# Patient Record
Sex: Female | Born: 1960
Health system: Southern US, Community
[De-identification: ages and names within clinical notes are randomized; demographics above are authoritative.]

## PROBLEM LIST (undated history)

## (undated) DIAGNOSIS — R112 Nausea with vomiting, unspecified: Secondary | ICD-10-CM

## (undated) DIAGNOSIS — Z8619 Personal history of other infectious and parasitic diseases: Secondary | ICD-10-CM

## (undated) DIAGNOSIS — E785 Hyperlipidemia, unspecified: Secondary | ICD-10-CM

## (undated) DIAGNOSIS — I10 Essential (primary) hypertension: Secondary | ICD-10-CM

## (undated) DIAGNOSIS — Z8489 Family history of other specified conditions: Secondary | ICD-10-CM

## (undated) DIAGNOSIS — K219 Gastro-esophageal reflux disease without esophagitis: Secondary | ICD-10-CM

## (undated) DIAGNOSIS — Z87442 Personal history of urinary calculi: Secondary | ICD-10-CM

## (undated) DIAGNOSIS — Z9889 Other specified postprocedural states: Secondary | ICD-10-CM

## (undated) DIAGNOSIS — M5430 Sciatica, unspecified side: Secondary | ICD-10-CM

## (undated) DIAGNOSIS — F419 Anxiety disorder, unspecified: Secondary | ICD-10-CM

## (undated) HISTORY — DX: Hyperlipidemia, unspecified: E78.5

## (undated) HISTORY — DX: Essential (primary) hypertension: I10

## (undated) HISTORY — PX: ECTOPIC PREGNANCY SURGERY: SHX613

## (undated) HISTORY — DX: Sciatica, unspecified side: M54.30

## (undated) HISTORY — DX: Personal history of other infectious and parasitic diseases: Z86.19

---

## 2002-10-25 HISTORY — PX: OTHER SURGICAL HISTORY: SHX169

## 2004-07-24 ENCOUNTER — Emergency Department: Payer: Self-pay | Admitting: Emergency Medicine

## 2004-10-25 HISTORY — PX: OTHER SURGICAL HISTORY: SHX169

## 2005-03-29 ENCOUNTER — Emergency Department: Payer: Self-pay | Admitting: Emergency Medicine

## 2005-04-25 ENCOUNTER — Encounter: Admission: RE | Admit: 2005-04-25 | Discharge: 2005-04-25 | Payer: Self-pay | Admitting: Orthopedic Surgery

## 2005-04-30 ENCOUNTER — Emergency Department: Payer: Self-pay | Admitting: Emergency Medicine

## 2005-05-03 ENCOUNTER — Ambulatory Visit (HOSPITAL_COMMUNITY): Admission: RE | Admit: 2005-05-03 | Discharge: 2005-05-03 | Payer: Self-pay | Admitting: Orthopedic Surgery

## 2005-07-09 ENCOUNTER — Inpatient Hospital Stay (HOSPITAL_COMMUNITY): Admission: RE | Admit: 2005-07-09 | Discharge: 2005-07-11 | Payer: Self-pay | Admitting: Neurosurgery

## 2005-07-15 ENCOUNTER — Emergency Department (HOSPITAL_COMMUNITY): Admission: EM | Admit: 2005-07-15 | Discharge: 2005-07-15 | Payer: Self-pay | Admitting: Emergency Medicine

## 2005-11-24 ENCOUNTER — Emergency Department: Payer: Self-pay | Admitting: Emergency Medicine

## 2006-03-10 ENCOUNTER — Encounter: Admission: RE | Admit: 2006-03-10 | Discharge: 2006-03-10 | Payer: Self-pay | Admitting: Orthopedic Surgery

## 2006-06-04 ENCOUNTER — Inpatient Hospital Stay: Payer: Self-pay | Admitting: Internal Medicine

## 2008-01-25 ENCOUNTER — Emergency Department: Payer: Self-pay | Admitting: Emergency Medicine

## 2008-03-26 ENCOUNTER — Emergency Department: Payer: Self-pay | Admitting: Emergency Medicine

## 2009-01-26 ENCOUNTER — Emergency Department: Payer: Self-pay | Admitting: Emergency Medicine

## 2009-06-20 ENCOUNTER — Emergency Department: Payer: Self-pay | Admitting: Emergency Medicine

## 2009-09-16 DIAGNOSIS — I10 Essential (primary) hypertension: Secondary | ICD-10-CM | POA: Insufficient documentation

## 2009-09-16 DIAGNOSIS — G5620 Lesion of ulnar nerve, unspecified upper limb: Secondary | ICD-10-CM | POA: Insufficient documentation

## 2009-09-16 DIAGNOSIS — G4700 Insomnia, unspecified: Secondary | ICD-10-CM | POA: Insufficient documentation

## 2009-10-25 HISTORY — PX: ABDOMINAL HYSTERECTOMY: SHX81

## 2009-10-25 HISTORY — PX: SUPRACERVICAL ABDOMINAL HYSTERECTOMY: SHX5393

## 2010-01-28 DIAGNOSIS — Z720 Tobacco use: Secondary | ICD-10-CM | POA: Insufficient documentation

## 2010-01-29 DIAGNOSIS — E7800 Pure hypercholesterolemia, unspecified: Secondary | ICD-10-CM | POA: Insufficient documentation

## 2010-02-27 ENCOUNTER — Ambulatory Visit: Payer: Self-pay | Admitting: Family Medicine

## 2010-03-03 ENCOUNTER — Ambulatory Visit: Payer: Self-pay | Admitting: Obstetrics and Gynecology

## 2010-03-10 ENCOUNTER — Ambulatory Visit: Payer: Self-pay | Admitting: Obstetrics and Gynecology

## 2010-03-30 ENCOUNTER — Ambulatory Visit: Payer: Self-pay | Admitting: Family Medicine

## 2010-07-23 ENCOUNTER — Emergency Department: Payer: Self-pay | Admitting: Emergency Medicine

## 2011-02-24 DIAGNOSIS — J449 Chronic obstructive pulmonary disease, unspecified: Secondary | ICD-10-CM | POA: Insufficient documentation

## 2011-04-01 ENCOUNTER — Ambulatory Visit: Payer: Self-pay | Admitting: Family Medicine

## 2011-04-06 ENCOUNTER — Ambulatory Visit: Payer: Self-pay | Admitting: Family Medicine

## 2011-08-05 ENCOUNTER — Ambulatory Visit: Payer: Self-pay | Admitting: Family Medicine

## 2011-10-07 ENCOUNTER — Ambulatory Visit: Payer: Self-pay | Admitting: Family Medicine

## 2012-04-10 ENCOUNTER — Ambulatory Visit: Payer: Self-pay | Admitting: Family Medicine

## 2012-04-10 DIAGNOSIS — R928 Other abnormal and inconclusive findings on diagnostic imaging of breast: Secondary | ICD-10-CM | POA: Diagnosis not present

## 2012-05-04 ENCOUNTER — Ambulatory Visit: Payer: Self-pay | Admitting: Orthopedic Surgery

## 2012-05-04 DIAGNOSIS — M79609 Pain in unspecified limb: Secondary | ICD-10-CM | POA: Diagnosis not present

## 2012-06-19 ENCOUNTER — Ambulatory Visit: Payer: Self-pay | Admitting: Orthopedic Surgery

## 2012-06-19 DIAGNOSIS — M542 Cervicalgia: Secondary | ICD-10-CM | POA: Diagnosis not present

## 2012-06-20 DIAGNOSIS — R7309 Other abnormal glucose: Secondary | ICD-10-CM | POA: Diagnosis not present

## 2012-06-20 DIAGNOSIS — Z0000 Encounter for general adult medical examination without abnormal findings: Secondary | ICD-10-CM | POA: Diagnosis not present

## 2012-06-20 DIAGNOSIS — R110 Nausea: Secondary | ICD-10-CM | POA: Diagnosis not present

## 2012-06-20 DIAGNOSIS — M25529 Pain in unspecified elbow: Secondary | ICD-10-CM | POA: Diagnosis not present

## 2012-06-20 DIAGNOSIS — E559 Vitamin D deficiency, unspecified: Secondary | ICD-10-CM | POA: Diagnosis not present

## 2012-06-20 DIAGNOSIS — E785 Hyperlipidemia, unspecified: Secondary | ICD-10-CM | POA: Diagnosis not present

## 2012-06-20 DIAGNOSIS — E7800 Pure hypercholesterolemia, unspecified: Secondary | ICD-10-CM | POA: Diagnosis not present

## 2012-07-05 DIAGNOSIS — M545 Low back pain, unspecified: Secondary | ICD-10-CM | POA: Diagnosis not present

## 2012-07-11 ENCOUNTER — Ambulatory Visit: Payer: Self-pay | Admitting: Family Medicine

## 2012-07-11 DIAGNOSIS — R110 Nausea: Secondary | ICD-10-CM | POA: Diagnosis not present

## 2012-07-28 DIAGNOSIS — J019 Acute sinusitis, unspecified: Secondary | ICD-10-CM | POA: Diagnosis not present

## 2012-07-28 DIAGNOSIS — F17200 Nicotine dependence, unspecified, uncomplicated: Secondary | ICD-10-CM | POA: Diagnosis not present

## 2012-07-28 DIAGNOSIS — J209 Acute bronchitis, unspecified: Secondary | ICD-10-CM | POA: Diagnosis not present

## 2012-10-05 ENCOUNTER — Other Ambulatory Visit: Payer: Self-pay | Admitting: Family Medicine

## 2012-10-06 NOTE — Telephone Encounter (Signed)
Please pull paper chart.  

## 2012-10-06 NOTE — Telephone Encounter (Signed)
Pt not found in medman by name/dob. Pt not seen in epic. No chart to pull.'  bf

## 2012-10-27 DIAGNOSIS — E559 Vitamin D deficiency, unspecified: Secondary | ICD-10-CM | POA: Diagnosis not present

## 2012-10-27 DIAGNOSIS — Z23 Encounter for immunization: Secondary | ICD-10-CM | POA: Diagnosis not present

## 2012-10-27 DIAGNOSIS — E7800 Pure hypercholesterolemia, unspecified: Secondary | ICD-10-CM | POA: Diagnosis not present

## 2012-10-27 DIAGNOSIS — I10 Essential (primary) hypertension: Secondary | ICD-10-CM | POA: Diagnosis not present

## 2012-11-24 ENCOUNTER — Ambulatory Visit: Payer: Self-pay | Admitting: Family Medicine

## 2012-11-24 DIAGNOSIS — M25569 Pain in unspecified knee: Secondary | ICD-10-CM | POA: Diagnosis not present

## 2012-11-24 DIAGNOSIS — M25469 Effusion, unspecified knee: Secondary | ICD-10-CM | POA: Diagnosis not present

## 2012-11-24 DIAGNOSIS — E7800 Pure hypercholesterolemia, unspecified: Secondary | ICD-10-CM | POA: Diagnosis not present

## 2012-11-24 DIAGNOSIS — Z23 Encounter for immunization: Secondary | ICD-10-CM | POA: Diagnosis not present

## 2012-11-24 DIAGNOSIS — E559 Vitamin D deficiency, unspecified: Secondary | ICD-10-CM | POA: Diagnosis not present

## 2012-11-30 ENCOUNTER — Other Ambulatory Visit: Payer: Self-pay | Admitting: Family Medicine

## 2012-12-04 ENCOUNTER — Other Ambulatory Visit: Payer: Self-pay | Admitting: Family Medicine

## 2012-12-15 DIAGNOSIS — M25569 Pain in unspecified knee: Secondary | ICD-10-CM | POA: Diagnosis not present

## 2012-12-15 DIAGNOSIS — E7800 Pure hypercholesterolemia, unspecified: Secondary | ICD-10-CM | POA: Diagnosis not present

## 2012-12-15 DIAGNOSIS — N390 Urinary tract infection, site not specified: Secondary | ICD-10-CM | POA: Diagnosis not present

## 2012-12-15 DIAGNOSIS — Z23 Encounter for immunization: Secondary | ICD-10-CM | POA: Diagnosis not present

## 2013-01-11 ENCOUNTER — Other Ambulatory Visit: Payer: Self-pay | Admitting: Family Medicine

## 2013-06-09 ENCOUNTER — Emergency Department: Payer: Self-pay | Admitting: Emergency Medicine

## 2013-06-09 DIAGNOSIS — I10 Essential (primary) hypertension: Secondary | ICD-10-CM | POA: Diagnosis not present

## 2013-06-09 DIAGNOSIS — M25569 Pain in unspecified knee: Secondary | ICD-10-CM | POA: Diagnosis not present

## 2013-06-09 DIAGNOSIS — Z79899 Other long term (current) drug therapy: Secondary | ICD-10-CM | POA: Diagnosis not present

## 2013-06-09 DIAGNOSIS — R609 Edema, unspecified: Secondary | ICD-10-CM | POA: Diagnosis not present

## 2013-07-12 DIAGNOSIS — Z0000 Encounter for general adult medical examination without abnormal findings: Secondary | ICD-10-CM | POA: Diagnosis not present

## 2013-07-12 DIAGNOSIS — Z23 Encounter for immunization: Secondary | ICD-10-CM | POA: Diagnosis not present

## 2013-07-12 DIAGNOSIS — E7800 Pure hypercholesterolemia, unspecified: Secondary | ICD-10-CM | POA: Diagnosis not present

## 2013-07-12 DIAGNOSIS — F329 Major depressive disorder, single episode, unspecified: Secondary | ICD-10-CM | POA: Diagnosis not present

## 2013-07-12 DIAGNOSIS — F17200 Nicotine dependence, unspecified, uncomplicated: Secondary | ICD-10-CM | POA: Diagnosis not present

## 2013-07-12 DIAGNOSIS — I10 Essential (primary) hypertension: Secondary | ICD-10-CM | POA: Diagnosis not present

## 2013-07-12 DIAGNOSIS — J309 Allergic rhinitis, unspecified: Secondary | ICD-10-CM | POA: Diagnosis not present

## 2013-07-12 DIAGNOSIS — E785 Hyperlipidemia, unspecified: Secondary | ICD-10-CM | POA: Diagnosis not present

## 2013-07-12 LAB — HM PAP SMEAR: HM Pap smear: NEGATIVE

## 2013-08-02 DIAGNOSIS — I10 Essential (primary) hypertension: Secondary | ICD-10-CM | POA: Diagnosis not present

## 2013-08-02 DIAGNOSIS — E785 Hyperlipidemia, unspecified: Secondary | ICD-10-CM | POA: Diagnosis not present

## 2013-08-02 DIAGNOSIS — N390 Urinary tract infection, site not specified: Secondary | ICD-10-CM | POA: Diagnosis not present

## 2013-08-02 DIAGNOSIS — F329 Major depressive disorder, single episode, unspecified: Secondary | ICD-10-CM | POA: Diagnosis not present

## 2013-08-02 DIAGNOSIS — Z23 Encounter for immunization: Secondary | ICD-10-CM | POA: Diagnosis not present

## 2013-08-02 DIAGNOSIS — J309 Allergic rhinitis, unspecified: Secondary | ICD-10-CM | POA: Diagnosis not present

## 2013-08-02 DIAGNOSIS — F17200 Nicotine dependence, unspecified, uncomplicated: Secondary | ICD-10-CM | POA: Diagnosis not present

## 2013-08-02 DIAGNOSIS — E7800 Pure hypercholesterolemia, unspecified: Secondary | ICD-10-CM | POA: Diagnosis not present

## 2013-09-25 DIAGNOSIS — F17200 Nicotine dependence, unspecified, uncomplicated: Secondary | ICD-10-CM | POA: Diagnosis not present

## 2013-09-25 DIAGNOSIS — E7800 Pure hypercholesterolemia, unspecified: Secondary | ICD-10-CM | POA: Diagnosis not present

## 2013-09-25 DIAGNOSIS — Z23 Encounter for immunization: Secondary | ICD-10-CM | POA: Diagnosis not present

## 2013-09-25 DIAGNOSIS — I10 Essential (primary) hypertension: Secondary | ICD-10-CM | POA: Diagnosis not present

## 2013-09-25 DIAGNOSIS — F329 Major depressive disorder, single episode, unspecified: Secondary | ICD-10-CM | POA: Diagnosis not present

## 2013-09-25 DIAGNOSIS — M549 Dorsalgia, unspecified: Secondary | ICD-10-CM | POA: Diagnosis not present

## 2013-09-25 DIAGNOSIS — R319 Hematuria, unspecified: Secondary | ICD-10-CM | POA: Diagnosis not present

## 2013-09-25 DIAGNOSIS — E785 Hyperlipidemia, unspecified: Secondary | ICD-10-CM | POA: Diagnosis not present

## 2013-11-08 ENCOUNTER — Ambulatory Visit: Payer: Self-pay | Admitting: Family Medicine

## 2013-11-08 DIAGNOSIS — F3289 Other specified depressive episodes: Secondary | ICD-10-CM | POA: Diagnosis not present

## 2013-11-08 DIAGNOSIS — Z23 Encounter for immunization: Secondary | ICD-10-CM | POA: Diagnosis not present

## 2013-11-08 DIAGNOSIS — R110 Nausea: Secondary | ICD-10-CM | POA: Diagnosis not present

## 2013-11-08 DIAGNOSIS — F329 Major depressive disorder, single episode, unspecified: Secondary | ICD-10-CM | POA: Diagnosis not present

## 2013-11-08 DIAGNOSIS — R319 Hematuria, unspecified: Secondary | ICD-10-CM | POA: Diagnosis not present

## 2013-11-08 DIAGNOSIS — E7800 Pure hypercholesterolemia, unspecified: Secondary | ICD-10-CM | POA: Diagnosis not present

## 2013-11-08 DIAGNOSIS — E785 Hyperlipidemia, unspecified: Secondary | ICD-10-CM | POA: Diagnosis not present

## 2013-11-08 DIAGNOSIS — I10 Essential (primary) hypertension: Secondary | ICD-10-CM | POA: Diagnosis not present

## 2013-11-08 DIAGNOSIS — F17200 Nicotine dependence, unspecified, uncomplicated: Secondary | ICD-10-CM | POA: Diagnosis not present

## 2013-11-08 DIAGNOSIS — Z9889 Other specified postprocedural states: Secondary | ICD-10-CM | POA: Diagnosis not present

## 2013-11-08 DIAGNOSIS — R51 Headache: Secondary | ICD-10-CM | POA: Diagnosis not present

## 2013-11-16 ENCOUNTER — Ambulatory Visit: Payer: Self-pay | Admitting: Family Medicine

## 2013-11-16 DIAGNOSIS — Z9889 Other specified postprocedural states: Secondary | ICD-10-CM | POA: Diagnosis not present

## 2013-11-16 DIAGNOSIS — R51 Headache: Secondary | ICD-10-CM | POA: Diagnosis not present

## 2013-12-26 ENCOUNTER — Ambulatory Visit: Payer: Self-pay | Admitting: Family Medicine

## 2013-12-26 DIAGNOSIS — F329 Major depressive disorder, single episode, unspecified: Secondary | ICD-10-CM | POA: Diagnosis not present

## 2013-12-26 DIAGNOSIS — I10 Essential (primary) hypertension: Secondary | ICD-10-CM | POA: Diagnosis not present

## 2013-12-26 DIAGNOSIS — E785 Hyperlipidemia, unspecified: Secondary | ICD-10-CM | POA: Diagnosis not present

## 2013-12-26 DIAGNOSIS — F17200 Nicotine dependence, unspecified, uncomplicated: Secondary | ICD-10-CM | POA: Diagnosis not present

## 2013-12-26 DIAGNOSIS — M5030 Other cervical disc degeneration, unspecified cervical region: Secondary | ICD-10-CM | POA: Diagnosis not present

## 2013-12-26 DIAGNOSIS — Z23 Encounter for immunization: Secondary | ICD-10-CM | POA: Diagnosis not present

## 2013-12-26 DIAGNOSIS — R319 Hematuria, unspecified: Secondary | ICD-10-CM | POA: Diagnosis not present

## 2013-12-26 DIAGNOSIS — M47812 Spondylosis without myelopathy or radiculopathy, cervical region: Secondary | ICD-10-CM | POA: Diagnosis not present

## 2013-12-26 DIAGNOSIS — M542 Cervicalgia: Secondary | ICD-10-CM | POA: Diagnosis not present

## 2013-12-26 DIAGNOSIS — F3289 Other specified depressive episodes: Secondary | ICD-10-CM | POA: Diagnosis not present

## 2013-12-26 DIAGNOSIS — E7800 Pure hypercholesterolemia, unspecified: Secondary | ICD-10-CM | POA: Diagnosis not present

## 2014-01-22 DIAGNOSIS — E785 Hyperlipidemia, unspecified: Secondary | ICD-10-CM | POA: Diagnosis not present

## 2014-01-22 DIAGNOSIS — J309 Allergic rhinitis, unspecified: Secondary | ICD-10-CM | POA: Diagnosis not present

## 2014-01-22 DIAGNOSIS — F329 Major depressive disorder, single episode, unspecified: Secondary | ICD-10-CM | POA: Diagnosis not present

## 2014-01-22 DIAGNOSIS — R51 Headache: Secondary | ICD-10-CM | POA: Diagnosis not present

## 2014-01-22 DIAGNOSIS — F3289 Other specified depressive episodes: Secondary | ICD-10-CM | POA: Diagnosis not present

## 2014-01-22 DIAGNOSIS — F17200 Nicotine dependence, unspecified, uncomplicated: Secondary | ICD-10-CM | POA: Diagnosis not present

## 2014-01-22 DIAGNOSIS — J019 Acute sinusitis, unspecified: Secondary | ICD-10-CM | POA: Diagnosis not present

## 2014-01-22 DIAGNOSIS — N390 Urinary tract infection, site not specified: Secondary | ICD-10-CM | POA: Diagnosis not present

## 2014-01-22 DIAGNOSIS — I10 Essential (primary) hypertension: Secondary | ICD-10-CM | POA: Diagnosis not present

## 2014-05-02 ENCOUNTER — Other Ambulatory Visit: Payer: Self-pay | Admitting: Family Medicine

## 2014-05-18 DIAGNOSIS — M5380 Other specified dorsopathies, site unspecified: Secondary | ICD-10-CM | POA: Diagnosis not present

## 2014-05-18 DIAGNOSIS — R300 Dysuria: Secondary | ICD-10-CM | POA: Diagnosis not present

## 2014-06-24 DIAGNOSIS — I10 Essential (primary) hypertension: Secondary | ICD-10-CM | POA: Diagnosis not present

## 2014-06-24 DIAGNOSIS — F17200 Nicotine dependence, unspecified, uncomplicated: Secondary | ICD-10-CM | POA: Diagnosis not present

## 2014-06-24 DIAGNOSIS — F4320 Adjustment disorder, unspecified: Secondary | ICD-10-CM | POA: Diagnosis not present

## 2014-06-24 DIAGNOSIS — M545 Low back pain, unspecified: Secondary | ICD-10-CM | POA: Diagnosis not present

## 2014-06-24 DIAGNOSIS — R51 Headache: Secondary | ICD-10-CM | POA: Diagnosis not present

## 2014-06-24 DIAGNOSIS — F329 Major depressive disorder, single episode, unspecified: Secondary | ICD-10-CM | POA: Diagnosis not present

## 2014-06-24 DIAGNOSIS — R209 Unspecified disturbances of skin sensation: Secondary | ICD-10-CM | POA: Diagnosis not present

## 2014-06-24 DIAGNOSIS — E785 Hyperlipidemia, unspecified: Secondary | ICD-10-CM | POA: Diagnosis not present

## 2014-06-24 DIAGNOSIS — F3289 Other specified depressive episodes: Secondary | ICD-10-CM | POA: Diagnosis not present

## 2014-06-24 LAB — BASIC METABOLIC PANEL
BUN: 9 mg/dL (ref 4–21)
Creatinine: 0.8 mg/dL (ref 0.5–1.1)
Glucose: 91 mg/dL
Potassium: 4.2 mmol/L (ref 3.4–5.3)
SODIUM: 139 mmol/L (ref 137–147)

## 2014-06-24 LAB — TSH: TSH: 1.41 u[IU]/mL (ref 0.41–5.90)

## 2014-07-11 DIAGNOSIS — F3289 Other specified depressive episodes: Secondary | ICD-10-CM | POA: Diagnosis not present

## 2014-07-11 DIAGNOSIS — J209 Acute bronchitis, unspecified: Secondary | ICD-10-CM | POA: Diagnosis not present

## 2014-07-11 DIAGNOSIS — R059 Cough, unspecified: Secondary | ICD-10-CM | POA: Diagnosis not present

## 2014-07-11 DIAGNOSIS — R51 Headache: Secondary | ICD-10-CM | POA: Diagnosis not present

## 2014-07-11 DIAGNOSIS — F17200 Nicotine dependence, unspecified, uncomplicated: Secondary | ICD-10-CM | POA: Diagnosis not present

## 2014-07-11 DIAGNOSIS — F329 Major depressive disorder, single episode, unspecified: Secondary | ICD-10-CM | POA: Diagnosis not present

## 2014-07-11 DIAGNOSIS — R209 Unspecified disturbances of skin sensation: Secondary | ICD-10-CM | POA: Diagnosis not present

## 2014-07-11 DIAGNOSIS — I10 Essential (primary) hypertension: Secondary | ICD-10-CM | POA: Diagnosis not present

## 2014-07-11 DIAGNOSIS — E785 Hyperlipidemia, unspecified: Secondary | ICD-10-CM | POA: Diagnosis not present

## 2014-07-11 DIAGNOSIS — R05 Cough: Secondary | ICD-10-CM | POA: Diagnosis not present

## 2014-07-24 ENCOUNTER — Emergency Department: Payer: Self-pay | Admitting: Emergency Medicine

## 2014-07-24 DIAGNOSIS — R112 Nausea with vomiting, unspecified: Secondary | ICD-10-CM | POA: Diagnosis not present

## 2014-07-24 DIAGNOSIS — Z880 Allergy status to penicillin: Secondary | ICD-10-CM | POA: Diagnosis not present

## 2014-07-24 DIAGNOSIS — I10 Essential (primary) hypertension: Secondary | ICD-10-CM | POA: Diagnosis not present

## 2014-07-24 DIAGNOSIS — R109 Unspecified abdominal pain: Secondary | ICD-10-CM | POA: Diagnosis not present

## 2014-07-24 DIAGNOSIS — R1110 Vomiting, unspecified: Secondary | ICD-10-CM | POA: Diagnosis not present

## 2014-07-24 LAB — COMPREHENSIVE METABOLIC PANEL
ALK PHOS: 127 U/L — AB
Albumin: 3.9 g/dL (ref 3.4–5.0)
Anion Gap: 6 — ABNORMAL LOW (ref 7–16)
BUN: 10 mg/dL (ref 7–18)
Bilirubin,Total: 0.6 mg/dL (ref 0.2–1.0)
CALCIUM: 8.9 mg/dL (ref 8.5–10.1)
CHLORIDE: 111 mmol/L — AB (ref 98–107)
CO2: 25 mmol/L (ref 21–32)
CREATININE: 0.93 mg/dL (ref 0.60–1.30)
EGFR (Non-African Amer.): >60
Glucose: 90 mg/dL (ref 65–99)
Osmolality: 282 (ref 275–301)
POTASSIUM: 4.1 mmol/L (ref 3.5–5.1)
SGOT(AST): 22 U/L (ref 15–37)
SGPT (ALT): 21 U/L
Sodium: 142 mmol/L (ref 136–145)
Total Protein: 7.5 g/dL (ref 6.4–8.2)

## 2014-07-24 LAB — CBC WITH DIFFERENTIAL/PLATELET
BASOS PCT: 0.7 %
Basophil #: 0.1 10*3/uL (ref 0.0–0.1)
EOS PCT: 0.9 %
Eosinophil #: 0.1 10*3/uL (ref 0.0–0.7)
HCT: 42.1 % (ref 35.0–47.0)
HGB: 13.9 g/dL (ref 12.0–16.0)
Lymphocyte #: 3.6 10*3/uL (ref 1.0–3.6)
Lymphocyte %: 37.2 %
MCH: 28.9 pg (ref 26.0–34.0)
MCHC: 33.1 g/dL (ref 32.0–36.0)
MCV: 87 fL (ref 80–100)
MONO ABS: 0.7 x10 3/mm (ref 0.2–0.9)
MONOS PCT: 7.7 %
NEUTROS ABS: 5.1 10*3/uL (ref 1.4–6.5)
Neutrophil %: 53.5 %
Platelet: 318 10*3/uL (ref 150–440)
RBC: 4.82 10*6/uL (ref 3.80–5.20)
RDW: 13.6 % (ref 11.5–14.5)
WBC: 9.5 10*3/uL (ref 3.6–11.0)

## 2014-07-24 LAB — URINALYSIS, COMPLETE
BACTERIA: NONE SEEN
BILIRUBIN, UR: NEGATIVE
Blood: NEGATIVE
GLUCOSE, UR: NEGATIVE mg/dL (ref 0–75)
KETONE: NEGATIVE
Leukocyte Esterase: NEGATIVE
Nitrite: NEGATIVE
Ph: 6 (ref 4.5–8.0)
Protein: NEGATIVE
RBC,UR: 2 /HPF (ref 0–5)
Specific Gravity: 1.012 (ref 1.003–1.030)
WBC UR: 2 /HPF (ref 0–5)

## 2014-07-24 LAB — TROPONIN I: Troponin-I: <0.02 ng/mL

## 2014-07-24 LAB — LIPASE, BLOOD: LIPASE: 95 U/L (ref 73–393)

## 2014-08-06 ENCOUNTER — Ambulatory Visit: Payer: Self-pay | Admitting: Family Medicine

## 2014-11-23 ENCOUNTER — Emergency Department: Payer: Self-pay | Admitting: Student

## 2014-11-23 DIAGNOSIS — G43909 Migraine, unspecified, not intractable, without status migrainosus: Secondary | ICD-10-CM | POA: Diagnosis not present

## 2014-11-23 DIAGNOSIS — R51 Headache: Secondary | ICD-10-CM | POA: Diagnosis not present

## 2014-11-23 DIAGNOSIS — Z720 Tobacco use: Secondary | ICD-10-CM | POA: Diagnosis not present

## 2014-11-23 DIAGNOSIS — I10 Essential (primary) hypertension: Secondary | ICD-10-CM | POA: Diagnosis not present

## 2014-11-23 DIAGNOSIS — R110 Nausea: Secondary | ICD-10-CM | POA: Diagnosis not present

## 2014-11-23 DIAGNOSIS — Z880 Allergy status to penicillin: Secondary | ICD-10-CM | POA: Diagnosis not present

## 2014-11-23 LAB — URINALYSIS, COMPLETE
Bilirubin,UR: NEGATIVE
Blood: NEGATIVE
Glucose,UR: NEGATIVE mg/dL (ref 0–75)
Leukocyte Esterase: NEGATIVE
NITRITE: NEGATIVE
PROTEIN: NEGATIVE
Ph: 6 (ref 4.5–8.0)
RBC,UR: 1 /HPF (ref 0–5)
SPECIFIC GRAVITY: 1.016 (ref 1.003–1.030)
Squamous Epithelial: 1

## 2014-11-23 LAB — COMPREHENSIVE METABOLIC PANEL
ALK PHOS: 128 U/L — AB (ref 46–116)
ALT: 27 U/L (ref 14–63)
AST: 24 U/L (ref 15–37)
Albumin: 3.9 g/dL (ref 3.4–5.0)
Anion Gap: 7 (ref 7–16)
BILIRUBIN TOTAL: 0.4 mg/dL (ref 0.2–1.0)
BUN: 10 mg/dL (ref 7–18)
CREATININE: 0.93 mg/dL (ref 0.60–1.30)
Calcium, Total: 9.2 mg/dL (ref 8.5–10.1)
Chloride: 109 mmol/L — ABNORMAL HIGH (ref 98–107)
Co2: 23 mmol/L (ref 21–32)
GLUCOSE: 108 mg/dL — AB (ref 65–99)
OSMOLALITY: 277 (ref 275–301)
Potassium: 3.6 mmol/L (ref 3.5–5.1)
Sodium: 139 mmol/L (ref 136–145)
TOTAL PROTEIN: 7.8 g/dL (ref 6.4–8.2)

## 2014-11-23 LAB — APTT: Activated PTT: 32.4 secs (ref 23.6–35.9)

## 2014-11-23 LAB — PROTIME-INR
INR: 0.9
Prothrombin Time: 12.6 secs

## 2014-11-23 LAB — CBC WITH DIFFERENTIAL/PLATELET
BASOS PCT: 0.7 %
Basophil #: 0.1 10*3/uL (ref 0.0–0.1)
Eosinophil #: 0.1 10*3/uL (ref 0.0–0.7)
Eosinophil %: 1 %
HCT: 44 % (ref 35.0–47.0)
HGB: 14.8 g/dL (ref 12.0–16.0)
LYMPHS PCT: 33.5 %
Lymphocyte #: 3.4 10*3/uL (ref 1.0–3.6)
MCH: 29.1 pg (ref 26.0–34.0)
MCHC: 33.5 g/dL (ref 32.0–36.0)
MCV: 87 fL (ref 80–100)
Monocyte #: 0.9 x10 3/mm (ref 0.2–0.9)
Monocyte %: 8.8 %
NEUTROS PCT: 56 %
Neutrophil #: 5.8 10*3/uL (ref 1.4–6.5)
PLATELETS: 307 10*3/uL (ref 150–440)
RBC: 5.07 10*6/uL (ref 3.80–5.20)
RDW: 14 % (ref 11.5–14.5)
WBC: 10.3 10*3/uL (ref 3.6–11.0)

## 2014-11-23 LAB — TROPONIN I: Troponin-I: <0.02 ng/mL

## 2015-01-08 DIAGNOSIS — E780 Pure hypercholesterolemia: Secondary | ICD-10-CM | POA: Diagnosis not present

## 2015-01-08 DIAGNOSIS — R7309 Other abnormal glucose: Secondary | ICD-10-CM | POA: Diagnosis not present

## 2015-01-08 DIAGNOSIS — E559 Vitamin D deficiency, unspecified: Secondary | ICD-10-CM | POA: Diagnosis not present

## 2015-01-08 LAB — LIPID PANEL
CHOLESTEROL: 201 mg/dL — AB (ref 0–200)
HDL: 48 mg/dL (ref 35–70)
LDL CALC: 131 mg/dL
TRIGLYCERIDES: 110 mg/dL (ref 40–160)

## 2015-01-08 LAB — HEMOGLOBIN A1C: Hgb A1c MFr Bld: 5.8 % (ref 4.0–6.0)

## 2015-04-02 ENCOUNTER — Other Ambulatory Visit: Payer: Self-pay | Admitting: Family Medicine

## 2015-04-02 NOTE — Telephone Encounter (Signed)
Please call in   [id number] [phone]   Surescripts Out Interface  31 minutes ago   (12:39 PM)       Requested Medications     Medication name:  Name from pharmacy:  traMADol (ULTRAM) 50 MG tablet TRAMADOL HCL 50 MG TABLET    Sig: TAKE 1 TABLET BY MOUTH EVERY 8 HOURS AS NEEDED    Dispense: 60 tablet   Start: 04/02/2015   RF x 3 Class: Normal

## 2015-04-03 ENCOUNTER — Other Ambulatory Visit: Payer: Self-pay | Admitting: Family Medicine

## 2015-04-03 NOTE — Telephone Encounter (Signed)
Pt contacted office for refill request on the following medications:HYDROCODON-ACETAMINOPH 7.5-325

## 2015-04-04 MED ORDER — HYDROCODONE-ACETAMINOPHEN 7.5-325 MG PO TABS: 1.0000 | ORAL_TABLET | Freq: Four times a day (QID) | ORAL | Status: DC | PRN

## 2015-04-04 NOTE — Telephone Encounter (Signed)
Phoned into [facility].

## 2015-04-22 ENCOUNTER — Telehealth: Payer: Self-pay | Admitting: Family Medicine

## 2015-04-22 MED ORDER — HYDROCODONE-ACETAMINOPHEN 7.5-325 MG PO TABS: 1.0000 | ORAL_TABLET | Freq: Four times a day (QID) | ORAL | Status: DC | PRN

## 2015-04-22 NOTE — Telephone Encounter (Signed)
Pt called to pick up Hydrocodone. RX  Call back [phone] tp

## 2015-05-09 ENCOUNTER — Other Ambulatory Visit: Payer: Self-pay | Admitting: Family Medicine

## 2015-05-09 NOTE — Telephone Encounter (Signed)
HYDROcodone-acetaminophen (NORCO) 7.5-325 MG per tablet 04/22/15 -- [name], MD Take 1 tablet by mouth every 6 (six) hours as needed for moderate pain. Pt needs refill written.

## 2015-05-12 MED ORDER — HYDROCODONE-ACETAMINOPHEN 7.5-325 MG PO TABS: 1.0000 | ORAL_TABLET | Freq: Four times a day (QID) | ORAL | Status: DC | PRN

## 2015-06-02 ENCOUNTER — Other Ambulatory Visit: Payer: Self-pay | Admitting: Family Medicine

## 2015-06-02 NOTE — Telephone Encounter (Signed)
HYDROcodone-acetaminophen (NORCO) 7.5-325 MG per tablet   05/12/15 -- [name], MD    Take 1 tablet by mouth every 6 (six) hours as needed for moderate pain.    traMADol (ULTRAM) 50 MG tablet   04/04/15 -- [name], MD    TAKE 1 TABLET BY MOUTH EVERY 8 HOURS AS NEEDED    Notes: This request is for a new prescription for a controlled substance as required by Federal/State law.Marland Kitchen

## 2015-06-03 MED ORDER — HYDROCODONE-ACETAMINOPHEN 7.5-325 MG PO TABS: 1.0000 | ORAL_TABLET | Freq: Four times a day (QID) | ORAL | Status: DC | PRN

## 2015-06-25 ENCOUNTER — Other Ambulatory Visit: Payer: Self-pay | Admitting: Family Medicine

## 2015-06-25 DIAGNOSIS — M545 Low back pain: Secondary | ICD-10-CM

## 2015-06-25 MED ORDER — HYDROCODONE-ACETAMINOPHEN 7.5-325 MG PO TABS: 1.0000 | ORAL_TABLET | Freq: Four times a day (QID) | ORAL | Status: DC | PRN

## 2015-06-25 NOTE — Telephone Encounter (Signed)
This is a pt of Dr. Caryn Section. Looks like he gave her a prescription on 06/03/2015 with 30 pills.  Next ov is on 07/03/2015.  Thanks,

## 2015-06-25 NOTE — Telephone Encounter (Signed)
Pt contacted office for refill request on the following medications:  HYDROcodone-acetaminophen ([place]) 7.5-325 MG.  CB#[phone].  This is a pt of Dr Opal Sidles

## 2015-06-26 ENCOUNTER — Telehealth: Payer: Self-pay | Admitting: Family Medicine

## 2015-06-27 DIAGNOSIS — R42 Dizziness and giddiness: Secondary | ICD-10-CM | POA: Insufficient documentation

## 2015-06-27 DIAGNOSIS — R61 Generalized hyperhidrosis: Secondary | ICD-10-CM | POA: Insufficient documentation

## 2015-06-27 DIAGNOSIS — G43909 Migraine, unspecified, not intractable, without status migrainosus: Secondary | ICD-10-CM | POA: Insufficient documentation

## 2015-06-27 DIAGNOSIS — R7303 Prediabetes: Secondary | ICD-10-CM | POA: Insufficient documentation

## 2015-06-27 DIAGNOSIS — Z8742 Personal history of other diseases of the female genital tract: Secondary | ICD-10-CM | POA: Insufficient documentation

## 2015-06-27 DIAGNOSIS — G629 Polyneuropathy, unspecified: Secondary | ICD-10-CM | POA: Insufficient documentation

## 2015-06-27 DIAGNOSIS — M25569 Pain in unspecified knee: Secondary | ICD-10-CM | POA: Insufficient documentation

## 2015-06-27 DIAGNOSIS — M1380 Other specified arthritis, unspecified site: Secondary | ICD-10-CM | POA: Insufficient documentation

## 2015-06-27 DIAGNOSIS — M545 Low back pain, unspecified: Secondary | ICD-10-CM | POA: Insufficient documentation

## 2015-06-27 DIAGNOSIS — G932 Benign intracranial hypertension: Secondary | ICD-10-CM | POA: Insufficient documentation

## 2015-06-27 DIAGNOSIS — M1990 Unspecified osteoarthritis, unspecified site: Secondary | ICD-10-CM | POA: Insufficient documentation

## 2015-06-27 DIAGNOSIS — R928 Other abnormal and inconclusive findings on diagnostic imaging of breast: Secondary | ICD-10-CM | POA: Insufficient documentation

## 2015-06-27 DIAGNOSIS — M549 Dorsalgia, unspecified: Secondary | ICD-10-CM | POA: Insufficient documentation

## 2015-06-27 DIAGNOSIS — F32A Depression, unspecified: Secondary | ICD-10-CM | POA: Insufficient documentation

## 2015-06-27 DIAGNOSIS — R634 Abnormal weight loss: Secondary | ICD-10-CM | POA: Insufficient documentation

## 2015-06-27 DIAGNOSIS — F329 Major depressive disorder, single episode, unspecified: Secondary | ICD-10-CM | POA: Insufficient documentation

## 2015-06-27 DIAGNOSIS — E559 Vitamin D deficiency, unspecified: Secondary | ICD-10-CM | POA: Insufficient documentation

## 2015-06-27 DIAGNOSIS — F4320 Adjustment disorder, unspecified: Secondary | ICD-10-CM | POA: Insufficient documentation

## 2015-06-27 DIAGNOSIS — R202 Paresthesia of skin: Secondary | ICD-10-CM | POA: Insufficient documentation

## 2015-06-27 DIAGNOSIS — N200 Calculus of kidney: Secondary | ICD-10-CM | POA: Insufficient documentation

## 2015-07-03 ENCOUNTER — Encounter: Payer: Self-pay | Admitting: Family Medicine

## 2015-07-03 ENCOUNTER — Ambulatory Visit (INDEPENDENT_AMBULATORY_CARE_PROVIDER_SITE_OTHER): Payer: 59 | Admitting: Family Medicine

## 2015-07-03 VITALS — BP 130/80 | HR 75 | Temp 98.8°F | Resp 16 | Ht 65.0 in | Wt 180.0 lb

## 2015-07-03 DIAGNOSIS — R7303 Prediabetes: Secondary | ICD-10-CM

## 2015-07-03 DIAGNOSIS — E7800 Pure hypercholesterolemia, unspecified: Secondary | ICD-10-CM

## 2015-07-03 DIAGNOSIS — R300 Dysuria: Secondary | ICD-10-CM | POA: Diagnosis not present

## 2015-07-03 DIAGNOSIS — R319 Hematuria, unspecified: Secondary | ICD-10-CM | POA: Diagnosis not present

## 2015-07-03 DIAGNOSIS — M545 Low back pain, unspecified: Secondary | ICD-10-CM

## 2015-07-03 DIAGNOSIS — R7309 Other abnormal glucose: Secondary | ICD-10-CM | POA: Diagnosis not present

## 2015-07-03 DIAGNOSIS — I10 Essential (primary) hypertension: Secondary | ICD-10-CM | POA: Diagnosis not present

## 2015-07-03 DIAGNOSIS — F329 Major depressive disorder, single episode, unspecified: Secondary | ICD-10-CM

## 2015-07-03 DIAGNOSIS — F32A Depression, unspecified: Secondary | ICD-10-CM

## 2015-07-03 LAB — POCT URINALYSIS DIPSTICK
Bilirubin, UA: NEGATIVE
Glucose, UA: NEGATIVE
KETONES UA: NEGATIVE
Leukocytes, UA: NEGATIVE
Nitrite, UA: NEGATIVE
PH UA: 6.5
PROTEIN UA: NEGATIVE
SPEC GRAV UA: 1.025
UROBILINOGEN UA: 0.2

## 2015-07-03 LAB — POCT GLYCOSYLATED HEMOGLOBIN (HGB A1C)
ESTIMATED AVERAGE GLUCOSE: 117
Hemoglobin A1C: 5.7

## 2015-07-03 MED ORDER — SIMVASTATIN 20 MG PO TABS: 20.0000 mg | ORAL_TABLET | Freq: Every day | ORAL | Status: DC

## 2015-07-03 MED ORDER — CIPROFLOXACIN HCL 500 MG PO TABS: 500.0000 mg | ORAL_TABLET | Freq: Two times a day (BID) | ORAL | Status: AC

## 2015-07-03 MED ORDER — AMLODIPINE BESYLATE 10 MG PO TABS: 10.0000 mg | ORAL_TABLET | Freq: Every day | ORAL | Status: DC

## 2015-07-03 NOTE — Progress Notes (Signed)
Patient: Jamie Williams Female    DOB: 1961-03-31   54 y.o.   MRN: [id number] Visit Date: 07/03/2015  Today's Provider: Lelon Huh, MD   Chief Complaint  Patient presents with  . Hypertension    follow up  . Depression    follow up  . Migraine    follow up  . Hyperglycemia    follow up   Subjective:    HPI  Hypertension, follow-up:  BP Readings from Last 3 Encounters:  07/03/15 130/80  01/31/15 142/84    She was last seen for hypertension 5 months ago.  BP at that visit was  120/70. Management since that visit includes  none. She reports fair compliance with treatment. Patient stopped taking Metoprolol beacsue she states it caused her blood pressure to be elevated.  She is not having side effects.  She is not exercising. She is not adherent to low salt diet.   Outside blood pressures are  normal. She is experiencing none.  Patient denies chest pain, chest pressure/discomfort, claudication, dyspnea, exertional chest pressure/discomfort, fatigue, irregular heart beat, lower extremity edema, near-syncope, orthopnea, palpitations, paroxysmal nocturnal dyspnea, syncope and tachypnea.   Cardiovascular risk factors include hypertension.  Use of agents associated with hypertension: none.     Weight trend: increasing steadily Wt Readings from Last 3 Encounters:  07/03/15 180 lb (81.647 kg)  01/31/15 185 lb (83.915 kg)    Current diet: in general, an "unhealthy" diet  ------------------------------------------------------------------------   Hyperglycemia, Follow-up:   Lab Results  Component Value Date   HGBA1C 5.8 01/08/2015   GLUCOSE 108* 11/23/2014   GLUCOSE 90 07/24/2014    Last seen for for this 6 months ago.  Management since then includes  none. Current symptoms include nausea, polydipsia and polyuria and have been stable.  Weight trend: increasing steadily Prior visit with dietician: no Current diet: in general, an "unhealthy" diet Current  exercise: none  Pertinent Labs:    Component Value Date/Time   CHOL 201* 01/08/2015   TRIG 110 01/08/2015   CREATININE 0.93 11/23/2014 1612   CREATININE 0.8 06/24/2014   Lab Results  Component Value Date   CHOL 201* 01/08/2015   HDL 48 01/08/2015   LDLCALC 131 01/08/2015   TRIG 110 01/08/2015    Wt Readings from Last 3 Encounters:  07/03/15 180 lb (81.647 kg)  01/31/15 185 lb (83.915 kg)       Follow up of Migraine headache:  Last office visit was 5 months ago and no changes were made. Patient states her migraines are unchanged since the last visit. We tried metoprolol but she states her blood pressure went up when she was taking it.  She also gets frequent tension headaches in the back of her head and neck on the right side.    Follow up Depression: Last office visit was 5 months ago and no changes were made. Patient reports good compliance with treatment, good tolerance, and good symptom control.   Allergies  Allergen Reactions  . Aspirin     nausea with vomiting.  . Sulfa Antibiotics     hives.  . Penicillins Rash   Previous Medications   AMLODIPINE (NORVASC) 10 MG TABLET    Take 10 mg by mouth daily. 1(one) oral daily   BUPROPION (WELLBUTRIN SR) 150 MG 12 HR TABLET    Take 1 tablet by mouth 2 (two) times daily.   CLONAZEPAM (KLONOPIN) 0.5 MG TABLET    Take 1 tablet by mouth  2 (two) times daily as needed.   GABAPENTIN (NEURONTIN) 300 MG CAPSULE    Take 1 capsule by mouth 3 (three) times daily.   HYDROCODONE-ACETAMINOPHEN (NORCO) 7.5-325 MG PER TABLET    Take 1 tablet by mouth every 6 (six) hours as needed for moderate pain.   MELOXICAM (MOBIC) 15 MG TABLET    Take 15 mg by mouth. Daily with food   PAROXETINE (PAXIL) 40 MG TABLET    Take 1 tablet by mouth daily.   PROMETHAZINE (PHENERGAN) 25 MG TABLET    TAKE 1 TABLET EVERY 4 TO 6 HOURS AS NEEDED FOR NAUSEA   RANITIDINE (ZANTAC) 150 MG TABLET    RANITIDINE HCL, 150MG  (Oral Tablet)  1 Two Times A Day for heartburn,  indigestion, nausea for 0 days  Quantity: 60.00;  Refills: 5   Ordered :22-Jul-2010  Reginia Forts MD;  Buddy Duty 28-Jan-2010 Active Comments: DX: 787.02   SIMVASTATIN (ZOCOR) 20 MG TABLET    Take 1 tablet by mouth daily.   TRAMADOL (ULTRAM) 50 MG TABLET    TAKE 1 TABLET BY MOUTH EVERY 8 HOURS AS NEEDED   VITAMIN D, ERGOCALCIFEROL, (DRISDOL) 50000 UNITS CAPS CAPSULE    Take 1 capsule by mouth once a week.    Review of Systems  Constitutional: Negative for fever, chills, appetite change and fatigue.  Respiratory: Negative for chest tightness and shortness of breath.   Cardiovascular: Negative for chest pain and palpitations.  Gastrointestinal: Negative for nausea, vomiting and abdominal pain.  Endocrine: Positive for polydipsia, polyphagia and polyuria.  Genitourinary: Positive for dysuria.  Musculoskeletal: Positive for back pain.  Neurological: Negative for dizziness and weakness.    Social History  Substance Use Topics  . Smoking status: Current Every Day Smoker  . Smokeless tobacco: Not on file     Comment: Current Every day smoker.Has been smoking for 28+ yrs. Quit in 2004 with Welbutrin; has cut back to 3 cigarettes daily.  . Alcohol Use: No   Objective:   BP 130/80 mmHg  Pulse 75  Temp(Src) 98.8 F (37.1 C) (Oral)  Resp 16  Ht 5\' 5"  (1.651 m)  Wt 180 lb (81.647 kg)  BMI 29.95 kg/m2  SpO2 99%  Physical Exam   General Appearance:    Alert, cooperative, no distress  Eyes:    PERRL, conjunctiva/corneas clear, EOM's intact       Lungs:     Clear to auscultation bilaterally, respirations unlabored  Heart:    Regular rate and rhythm  Neurologic:   Awake, alert, oriented x 3. No apparent focal neurological           defect.           Assessment & Plan:     1. Pre-diabetes  - POCT HgB A1C  2. Dysuria  - POCT urinalysis dipstick  3. Benign essential HTN  - amLODipine (NORVASC) 10 MG tablet; Take 1 tablet (10 mg total) by mouth daily. 1(one) oral daily   Dispense: 30 tablet; Refill: 12  4. Clinical depression well controlled Continue current medications.    5. Right-sided low back pain without sciatica  Likely UTI.   6. Hematuria Call if back pain and dysuria due not completely resolved on Cipro  - ciprofloxacin (CIPRO) 500 MG tablet; Take 1 tablet (500 mg total) by mouth 2 (two) times daily.  Dispense: 20 tablet; Refill: 0  7. Hypercholesterolemia without hypertriglyceridemia She is tolerating simvastatin well with no adverse effects.   - simvastatin (ZOCOR) 20 MG tablet;  Take 1 tablet (20 mg total) by mouth daily.  Dispense: 30 tablet; Refill: 6       Lelon Huh, MD  [facility]

## 2015-07-04 ENCOUNTER — Encounter: Payer: Self-pay | Admitting: Family Medicine

## 2015-07-16 ENCOUNTER — Other Ambulatory Visit: Payer: Self-pay | Admitting: Family Medicine

## 2015-07-16 DIAGNOSIS — M545 Low back pain: Secondary | ICD-10-CM

## 2015-07-16 MED ORDER — HYDROCODONE-ACETAMINOPHEN 7.5-325 MG PO TABS: 1.0000 | ORAL_TABLET | Freq: Four times a day (QID) | ORAL | Status: DC | PRN

## 2015-07-16 NOTE — Telephone Encounter (Signed)
Pt needs refill HYDROcodone-acetaminophen (NORCO) 7.5-325 MG per tablet  Thanks teri

## 2015-07-16 NOTE — Telephone Encounter (Signed)
Last ov was on 07/03/2015 and next ov appointment is on 12/31/15. Last time norco was refilled was on 06/25/2015.    Thanks,

## 2015-08-11 ENCOUNTER — Other Ambulatory Visit: Payer: Self-pay | Admitting: Family Medicine

## 2015-08-11 DIAGNOSIS — M545 Low back pain: Secondary | ICD-10-CM

## 2015-08-11 NOTE — Telephone Encounter (Signed)
Pt contacted office for refill request on the following medications:  HYDROcodone-acetaminophen ([place]) 7.5-325 MG.  WQ#[phone]/VQ

## 2015-08-12 MED ORDER — HYDROCODONE-ACETAMINOPHEN 7.5-325 MG PO TABS: 1.0000 | ORAL_TABLET | Freq: Four times a day (QID) | ORAL | Status: DC | PRN

## 2015-08-12 NOTE — Telephone Encounter (Signed)
Last ov was on 07/03/2015. Next ov appointment is on 12/31/2015. Last refill for this medication was on 07/16/2015.  Thanks,

## 2015-08-28 ENCOUNTER — Telehealth: Payer: Self-pay

## 2015-08-28 NOTE — Telephone Encounter (Signed)
Patient called for an appointment with complains of left arm pain. Triage patient. Per patient no shortness of breath and no chest pain. Only pain from her neck down her arm with numbness. Asked doctor Caryn Section if ok to give appointment. Per doctor Caryn Section ok to schedule patient for today at 11:30 am. Per patient unable to come at that time. Offered appointment with another provider patient declined. Only wants with doctor Fisher. Scheduled patient for tomorrow 08/29/15 at 8:45 ok per doctor Fisher. Per Dr. Electa Sniff patient to go the ER if symptoms worsen or develop shortness of breath or chest pain. Patient advised and voiced understanding.  Thanks,  -Joseline

## 2015-08-29 ENCOUNTER — Ambulatory Visit (INDEPENDENT_AMBULATORY_CARE_PROVIDER_SITE_OTHER): Payer: 59 | Admitting: Family Medicine

## 2015-08-29 ENCOUNTER — Ambulatory Visit
Admission: RE | Admit: 2015-08-29 | Discharge: 2015-08-29 | Disposition: A | Discharge status: Home / Self Care | Payer: 59 | Source: Ambulatory Visit | Attending: Family Medicine | Admitting: Family Medicine

## 2015-08-29 ENCOUNTER — Encounter: Payer: Self-pay | Admitting: Family Medicine

## 2015-08-29 VITALS — BP 148/88 | HR 78 | Temp 98.4°F | Resp 20 | Ht 65.0 in | Wt 179.0 lb

## 2015-08-29 DIAGNOSIS — M542 Cervicalgia: Secondary | ICD-10-CM

## 2015-08-29 DIAGNOSIS — M479 Spondylosis, unspecified: Secondary | ICD-10-CM | POA: Diagnosis not present

## 2015-08-29 DIAGNOSIS — M79602 Pain in left arm: Secondary | ICD-10-CM | POA: Insufficient documentation

## 2015-08-29 DIAGNOSIS — Z23 Encounter for immunization: Secondary | ICD-10-CM | POA: Diagnosis not present

## 2015-08-29 DIAGNOSIS — M50322 Other cervical disc degeneration at C5-C6 level: Secondary | ICD-10-CM | POA: Diagnosis not present

## 2015-08-29 MED ORDER — KETOROLAC TROMETHAMINE 30 MG/ML IJ SOLN
30.0000 mg | Freq: Once | INTRAMUSCULAR | Status: AC
  Administered 2015-08-29: 30 mg via INTRAMUSCULAR

## 2015-08-29 MED ORDER — KETOROLAC TROMETHAMINE 60 MG/2ML IM SOLN: 60.0000 mg | Freq: Once | INTRAMUSCULAR | Status: DC

## 2015-08-29 NOTE — Progress Notes (Signed)
Patient: Jamie Williams Female    DOB: Aug 06, 1961   54 y.o.   MRN: [id number] Visit Date: 08/29/2015  Today's Provider: Lelon Huh, MD   Chief Complaint  Patient presents with  . Shoulder Pain    x 1 week   Subjective:    Shoulder Pain  The pain is present in the left shoulder. This is a new problem. Episode onset: started 1 week ago. There has been no history of extremity trauma. The problem occurs constantly. The problem has been gradually worsening. The quality of the pain is described as aching. The pain is moderate. Associated symptoms include joint swelling (in hands), a limited range of motion, numbness (in left arm and fingers), stiffness and tingling (in hands; feels like pins in her hands). Pertinent negatives include no fever or itching. The symptoms are aggravated by activity. Treatments tried: Meloxicam, Hydrocodone, Tramadol. The treatment provided mild relief.  Patient states pain starts in her left shoulder and radiated done her arm into her fingers.      Allergies  Allergen Reactions  . Aspirin     nausea with vomiting.  . Sulfa Antibiotics     hives.  . Penicillins Rash   Previous Medications   AMLODIPINE (NORVASC) 10 MG TABLET    Take 1 tablet (10 mg total) by mouth daily. 1(one) oral daily   BUPROPION (WELLBUTRIN SR) 150 MG 12 HR TABLET    Take 1 tablet by mouth 2 (two) times daily.   CLONAZEPAM (KLONOPIN) 0.5 MG TABLET    Take 1 tablet by mouth 2 (two) times daily as needed.   GABAPENTIN (NEURONTIN) 300 MG CAPSULE    Take 1 capsule by mouth 3 (three) times daily.   HYDROCODONE-ACETAMINOPHEN (NORCO) 7.5-325 MG TABLET    Take 1 tablet by mouth every 6 (six) hours as needed for moderate pain.   MELOXICAM (MOBIC) 15 MG TABLET    Take 15 mg by mouth. Daily with food   PAROXETINE (PAXIL) 40 MG TABLET    Take 1 tablet by mouth daily.   PROMETHAZINE (PHENERGAN) 25 MG TABLET    TAKE 1 TABLET EVERY 4 TO 6 HOURS AS NEEDED FOR NAUSEA   RANITIDINE (ZANTAC) 150  MG TABLET    RANITIDINE HCL, 150MG  (Oral Tablet)  1 Two Times A Day for heartburn, indigestion, nausea for 0 days  Quantity: 60.00;  Refills: 5   Ordered :22-Jul-2010  Reginia Forts MD;  Buddy Duty 28-Jan-2010 Active Comments: DX: 787.02   SIMVASTATIN (ZOCOR) 20 MG TABLET    Take 1 tablet (20 mg total) by mouth daily.   TRAMADOL (ULTRAM) 50 MG TABLET    TAKE 1 TABLET BY MOUTH EVERY 8 HOURS AS NEEDED   VITAMIN D, ERGOCALCIFEROL, (DRISDOL) 50000 UNITS CAPS CAPSULE    Take 1 capsule by mouth once a week.    Review of Systems  Constitutional: Positive for diaphoresis. Negative for fever, chills, appetite change and fatigue.  Respiratory: Negative for chest tightness and shortness of breath.   Cardiovascular: Negative for chest pain and palpitations.  Gastrointestinal: Negative for nausea, vomiting and abdominal pain.  Musculoskeletal: Positive for myalgias, joint swelling, arthralgias, stiffness, neck pain (left side) and neck stiffness.  Skin: Negative for itching.  Neurological: Positive for tingling (in hands; feels like pins in her hands) and numbness (in left arm and fingers). Negative for dizziness and weakness.    Social History  Substance Use Topics  . Smoking status: Current Every Day Smoker  .  Smokeless tobacco: Not on file     Comment: Current Every day smoker.Has been smoking for 28+ yrs. Quit in 2004 with Welbutrin; has cut back to 3 cigarettes daily.  . Alcohol Use: No   Objective:   BP 148/88 mmHg  Pulse 78  Temp(Src) 98.4 F (36.9 C) (Oral)  Resp 20  Ht 5\' 5"  (1.651 m)  Wt 179 lb (81.194 kg)  BMI 29.79 kg/m2  SpO2 99%  LMP   Physical Exam  General appearance: alert, well developed, well nourished, cooperative and in no distress Head: Normocephalic, without obvious abnormality, atraumatic Lungs: Respirations even and unlabored Extremities: No gross deformities Skin: Skin color, texture, turgor normal. No rashes seen  Psych: Appropriate mood and  affect. Neurologic: Mental status: Alert, oriented to person, place, and time, thought content appropriate. MS: Moderate tenderness and slight slight swelling muscles of left superior shoulder and neck. Mild tenderness of cspine. +4 muscle strength of left UE secondary to pain.     Assessment & Plan:     1. Left arm pain Likely radicular. Given Toradol in the office for pain relief. Feels like some spasm of trapezius. Consider adding muscle relaxer after reviewing X-rays.  - ketorolac (TORADOL) injection 60 mg; Inject 2 mLs (60 mg total) into the muscle once.  2. Neck pain  - DG Cervical Spine Complete; Future  3. Need for influenza vaccination  - Flu Vaccine QUAD 36+ mos IM       Lelon Huh, MD  [facility]

## 2015-09-01 ENCOUNTER — Telehealth: Payer: Self-pay | Admitting: Family Medicine

## 2015-09-01 MED ORDER — PREDNISONE 10 MG PO TABS: ORAL_TABLET | ORAL | Status: DC

## 2015-09-01 NOTE — Telephone Encounter (Signed)
-----   Message from [name], MD sent at 09/01/2015  1:12 PM EST ----- Xray shows narrowing of space between vertebra which is probably pinching nerves going to arm. Need start 12 day prednisone taper and follow up in 10-14 days. If not clearing up on prednisone may need to get MRI.

## 2015-09-01 NOTE — Telephone Encounter (Signed)
Patient was notified of results. Patient expressed understanding. Rx called into pharmacy.

## 2015-09-01 NOTE — Telephone Encounter (Signed)
Pt is requesting results from x-ray.  CB#[phone]/MW

## 2015-09-01 NOTE — Telephone Encounter (Signed)
Please advise xray results.

## 2015-09-03 ENCOUNTER — Telehealth: Payer: Self-pay

## 2015-09-03 NOTE — Telephone Encounter (Signed)
Patient wants to know if she can take hydrocodone and prednisone together.  Please advise.  CB [phone]  Thanks,  -Joseline

## 2015-09-03 NOTE — Telephone Encounter (Signed)
Pt is requesting a call back.  CB#[phone]/MW

## 2015-09-03 NOTE — Telephone Encounter (Signed)
Left detailed message on vm. Okay per dpr. 

## 2015-09-03 NOTE — Telephone Encounter (Signed)
yes

## 2015-09-04 ENCOUNTER — Other Ambulatory Visit: Payer: Self-pay | Admitting: Family Medicine

## 2015-09-04 DIAGNOSIS — M545 Low back pain: Secondary | ICD-10-CM

## 2015-09-04 DIAGNOSIS — Z1231 Encounter for screening mammogram for malignant neoplasm of breast: Secondary | ICD-10-CM

## 2015-09-04 NOTE — Telephone Encounter (Signed)
Pt contacted office for refill request on the following medications: HYDROcodone-acetaminophen ([place]) 7.5-325 MG.  CB#[phone]/MW

## 2015-09-05 MED ORDER — HYDROCODONE-ACETAMINOPHEN 7.5-325 MG PO TABS: 1.0000 | ORAL_TABLET | Freq: Four times a day (QID) | ORAL | Status: DC | PRN

## 2015-09-10 ENCOUNTER — Other Ambulatory Visit: Payer: Self-pay | Admitting: Family Medicine

## 2015-09-10 ENCOUNTER — Ambulatory Visit
Admission: RE | Admit: 2015-09-10 | Discharge: 2015-09-10 | Disposition: A | Discharge status: Home / Self Care | Payer: 59 | Source: Ambulatory Visit | Attending: Family Medicine | Admitting: Family Medicine

## 2015-09-10 DIAGNOSIS — Z1231 Encounter for screening mammogram for malignant neoplasm of breast: Secondary | ICD-10-CM | POA: Diagnosis not present

## 2015-09-11 ENCOUNTER — Encounter: Payer: Self-pay | Admitting: Family Medicine

## 2015-09-11 ENCOUNTER — Ambulatory Visit (INDEPENDENT_AMBULATORY_CARE_PROVIDER_SITE_OTHER): Payer: 59 | Admitting: Family Medicine

## 2015-09-11 VITALS — BP 138/82 | HR 80 | Temp 98.4°F | Resp 16 | Wt 181.0 lb

## 2015-09-11 DIAGNOSIS — M5410 Radiculopathy, site unspecified: Secondary | ICD-10-CM

## 2015-09-11 DIAGNOSIS — M542 Cervicalgia: Secondary | ICD-10-CM

## 2015-09-11 DIAGNOSIS — M79602 Pain in left arm: Secondary | ICD-10-CM | POA: Diagnosis not present

## 2015-09-11 MED ORDER — PREDNISONE 20 MG PO TABS: 20.0000 mg | ORAL_TABLET | Freq: Two times a day (BID) | ORAL | Status: DC | PRN

## 2015-09-11 NOTE — Progress Notes (Signed)
Patient ID: Jamie Williams, female   DOB: Dec 24, 1960, 54 y.o.   MRN: [id number]       Patient: Jamie Williams Female    DOB: 10-29-1960   54 y.o.   MRN: [id number] Visit Date: 09/11/2015  Today's Provider: Lelon Huh, MD   Chief Complaint  Patient presents with  . Shoulder Pain    2 week F/U.    Subjective:    Shoulder Pain  The pain is present in the left shoulder and left arm. This is a new problem. The current episode started 1 to 4 weeks ago. There has been no history of extremity trauma. The problem occurs constantly. The problem has been gradually improving. The quality of the pain is described as dull. The pain is at a severity of 5/10. The pain is moderate. Associated symptoms include joint swelling, a limited range of motion, numbness, stiffness and tingling.  Patient is here to follow up on her shoulder and arm pain. She was seen 08/29/15 and had xrays as below. She was started on 60mg  prednisone taper and states symptoms mostly resolved until she weaned down to 30mg  of prednisone. Symptoms are now at the same intensity as they were prior to starting prednisone.   Dg Cervical Spine Complete 08/29/2015  EXAM: CERVICAL SPINE - COMPLETE 4+ VIEW COMPARISON:  Cervical spine series of December 26, 2013 FINDINGS: The cervical vertebral bodies are preserved in height. There is moderate degenerative disc space narrowing and endplate osteophyte formation at C5-6 and C6-7. There is mild facet joint hypertrophy at these levels. The spinous processes are intact. There is mild degenerative change of the atlanto dens articulation. The oblique views reveal mild bony encroachment bilaterally in the mid and lower cervical spine. The prevertebral soft tissue spaces are normal. These findings have progressed since the previous study.  IMPRESSION: Cervical spondylosis with disc space narrowing and facet joint hypertrophy at C5-6 and C6-7 which has progressed since the previous study. There is no compression  fracture nor other acute cervical spine abnormality. Bilateral mid and lower cervical neural foraminal encroachment is present. Electronically Signed   By: David  Martinique M.D.   On: 08/29/2015 10:17     Allergies  Allergen Reactions  . Aspirin     nausea with vomiting.  . Sulfa Antibiotics     hives.  . Penicillins Rash   Previous Medications   AMLODIPINE (NORVASC) 10 MG TABLET    Take 1 tablet (10 mg total) by mouth daily. 1(one) oral daily   BUPROPION (WELLBUTRIN SR) 150 MG 12 HR TABLET    Take 1 tablet by mouth 2 (two) times daily.   CLONAZEPAM (KLONOPIN) 0.5 MG TABLET    Take 1 tablet by mouth 2 (two) times daily as needed.   GABAPENTIN (NEURONTIN) 300 MG CAPSULE    Take 1 capsule by mouth 3 (three) times daily.   HYDROCODONE-ACETAMINOPHEN (NORCO) 7.5-325 MG TABLET    Take 1 tablet by mouth every 6 (six) hours as needed for moderate pain.   MELOXICAM (MOBIC) 15 MG TABLET    Take 15 mg by mouth. Daily with food   PAROXETINE (PAXIL) 40 MG TABLET    Take 1 tablet by mouth daily.   PREDNISONE (DELTASONE) 10 MG TABLET    Take By Mouth 6 tablets qd for 2 days, 5 Tablets qd for 2 days, 4 Tablets qd for 2 days, 3 Tablets qd  for 2 days, 2 Tablets qd for 2 days, 1 Tablet qd for 2 days  PROMETHAZINE (PHENERGAN) 25 MG TABLET    TAKE 1 TABLET EVERY 4 TO 6 HOURS AS NEEDED FOR NAUSEA   RANITIDINE (ZANTAC) 150 MG TABLET    RANITIDINE HCL, 150MG  (Oral Tablet)  1 Two Times A Day for heartburn, indigestion, nausea for 0 days  Quantity: 60.00;  Refills: 5   Ordered :22-Jul-2010  Reginia Forts MD;  Buddy Duty 28-Jan-2010 Active Comments: DX: 787.02   SIMVASTATIN (ZOCOR) 20 MG TABLET    Take 1 tablet (20 mg total) by mouth daily.   TRAMADOL (ULTRAM) 50 MG TABLET    TAKE 1 TABLET BY MOUTH EVERY 8 HOURS AS NEEDED   VITAMIN D, ERGOCALCIFEROL, (DRISDOL) 50000 UNITS CAPS CAPSULE    Take 1 capsule by mouth once a week.    Review of Systems  Constitutional: Negative.   Respiratory: Negative.     Cardiovascular: Negative.   Musculoskeletal: Positive for myalgias, joint swelling, stiffness and neck pain.  Skin: Negative.   Neurological: Positive for tingling and numbness.    Social History  Substance Use Topics  . Smoking status: Current Every Day Smoker  . Smokeless tobacco: Not on file     Comment: Current Every day smoker.Has been smoking for 28+ yrs. Quit in 2004 with Welbutrin; has cut back to 3 cigarettes daily.  . Alcohol Use: No   Objective:   BP 138/82 mmHg  Pulse 80  Temp(Src) 98.4 F (36.9 C)  Resp 16  Wt 181 lb (82.101 kg)  Physical Exam General appearance: alert, well developed, well nourished, cooperative and in no distress Head: Normocephalic, without obvious abnormality, atraumatic Lungs: Respirations even and unlabored Extremities: No gross deformities Skin: Skin color, texture, turgor normal. No rashes seen  Psych: Appropriate mood and affect. Neurologic: Mental status: Alert, oriented to person, place, and time, thought content appropriate.      Assessment & Plan:     1. Neck pain Briefly responded to prednisone. Start back at 20mg  twice a day and wean to 20mg  day once symptoms improve. (#30, rf x 0) - MR Cervical Spine W Wo Contrast; Future  2. Left arm pain  - MR Cervical Spine W Wo Contrast; Future  3. Radiculopathy of arm         Lelon Huh, MD  [facility]

## 2015-09-24 ENCOUNTER — Telehealth: Payer: Self-pay

## 2015-09-24 NOTE — Telephone Encounter (Signed)
Advised patient as below. Scheduled appt for 10/15/15 for re-evaluation.

## 2015-09-24 NOTE — Telephone Encounter (Signed)
-----   Message from [name], MD sent at 09/24/2015  8:03 AM EST ----- Regarding: MRI Please advise patient that her insurance will not cover the MRI until at least 6 weeks pass since start of her symptoms. They require a follow up office visit and evaluation before we can re-order MRI. Need to schedule follow up office visit 2-3 weeks from now for re-evaluation.

## 2015-09-25 ENCOUNTER — Telehealth: Payer: Self-pay | Admitting: Family Medicine

## 2015-09-25 DIAGNOSIS — M545 Low back pain: Secondary | ICD-10-CM

## 2015-09-25 NOTE — Telephone Encounter (Signed)
Pt contacted office for refill request on the following medications: HYDROcodone-acetaminophen ([place]CO) 7.5-325 MG tablet. Pt stated she took her last dose last night 09/24/15. Pt stated that she thinks the predniSONE (DELTASONE) 20 MG tablet caused her to vomit and feel nauseous on 09/23/15. Pt stated that is the last time she took that medication and that she hasn't vomited since but still feels nauseous. Please advise. Thanks TNP

## 2015-09-25 NOTE — Telephone Encounter (Signed)
Left message to call back  

## 2015-09-26 NOTE — Telephone Encounter (Signed)
LMOVM to return call.

## 2015-09-29 MED ORDER — HYDROCODONE-ACETAMINOPHEN 7.5-325 MG PO TABS: 1.0000 | ORAL_TABLET | Freq: Four times a day (QID) | ORAL | Status: DC | PRN

## 2015-09-30 NOTE — Telephone Encounter (Signed)
Ready to pick up.  

## 2015-10-01 NOTE — Telephone Encounter (Signed)
Patient was notified.

## 2015-10-03 ENCOUNTER — Ambulatory Visit
Admission: RE | Admit: 2015-10-03 | Discharge: 2015-10-03 | Disposition: A | Discharge status: Home / Self Care | Payer: 59 | Source: Ambulatory Visit | Attending: Family Medicine | Admitting: Family Medicine

## 2015-10-03 ENCOUNTER — Telehealth: Payer: Self-pay | Admitting: *Deleted

## 2015-10-03 DIAGNOSIS — M47892 Other spondylosis, cervical region: Secondary | ICD-10-CM | POA: Diagnosis not present

## 2015-10-03 DIAGNOSIS — M50323 Other cervical disc degeneration at C6-C7 level: Secondary | ICD-10-CM | POA: Diagnosis not present

## 2015-10-03 DIAGNOSIS — M2578 Osteophyte, vertebrae: Secondary | ICD-10-CM | POA: Diagnosis not present

## 2015-10-03 DIAGNOSIS — M542 Cervicalgia: Secondary | ICD-10-CM | POA: Diagnosis not present

## 2015-10-03 DIAGNOSIS — M79602 Pain in left arm: Secondary | ICD-10-CM | POA: Insufficient documentation

## 2015-10-03 DIAGNOSIS — S149XXA Injury of unspecified nerves of neck, initial encounter: Secondary | ICD-10-CM

## 2015-10-03 LAB — POCT I-STAT CREATININE: Creatinine, Ser: 0.9 mg/dL (ref 0.44–1.00)

## 2015-10-03 MED ORDER — GADOBENATE DIMEGLUMINE 529 MG/ML IV SOLN
20.0000 mL | Freq: Once | INTRAVENOUS | Status: AC | PRN
  Administered 2015-10-03: 17 mL via INTRAVENOUS

## 2015-10-03 NOTE — Telephone Encounter (Signed)
Patient was notified of results. Patient expressed understanding. Patient is agreeable to referral.

## 2015-10-03 NOTE — Telephone Encounter (Signed)
-----   Message from [name], MD sent at 10/03/2015  1:23 PM EST ----- MRI shows degenerative disk disease with spur pushing against spinal nerve, which is causing her pain. Need referral to neurosurgery for further evaluation and treatment.

## 2015-10-07 DIAGNOSIS — S149XXA Injury of unspecified nerves of neck, initial encounter: Secondary | ICD-10-CM | POA: Insufficient documentation

## 2015-10-07 NOTE — Telephone Encounter (Signed)
Please refer neurosurgery for cervical radiculopathy and cervical nerve impingement

## 2015-10-15 ENCOUNTER — Encounter: Payer: Self-pay | Admitting: Family Medicine

## 2015-10-15 ENCOUNTER — Ambulatory Visit (INDEPENDENT_AMBULATORY_CARE_PROVIDER_SITE_OTHER): Payer: 59 | Admitting: Family Medicine

## 2015-10-15 VITALS — BP 136/82 | HR 78 | Temp 97.7°F | Resp 16 | Ht 66.0 in | Wt 178.0 lb

## 2015-10-15 DIAGNOSIS — G588 Other specified mononeuropathies: Secondary | ICD-10-CM

## 2015-10-15 DIAGNOSIS — G43809 Other migraine, not intractable, without status migrainosus: Secondary | ICD-10-CM

## 2015-10-15 DIAGNOSIS — S149XXA Injury of unspecified nerves of neck, initial encounter: Secondary | ICD-10-CM

## 2015-10-15 DIAGNOSIS — R202 Paresthesia of skin: Secondary | ICD-10-CM

## 2015-10-15 MED ORDER — GABAPENTIN 300 MG PO CAPS: 300.0000 mg | ORAL_CAPSULE | Freq: Three times a day (TID) | ORAL | Status: DC

## 2015-10-15 MED ORDER — HYDROCODONE-ACETAMINOPHEN 7.5-325 MG PO TABS: 1.0000 | ORAL_TABLET | Freq: Four times a day (QID) | ORAL | Status: DC | PRN

## 2015-10-15 MED ORDER — DEXAMETHASONE 2 MG PO TABS: 2.0000 mg | ORAL_TABLET | Freq: Two times a day (BID) | ORAL | Status: DC | PRN

## 2015-10-15 NOTE — Progress Notes (Signed)
Patient: Jamie Williams Female    DOB: Feb 18, 1961   54 y.o.   MRN: [id number] Visit Date: 10/15/2015  Today's Provider: Lelon Huh, MD   Chief Complaint  Patient presents with  . Neck Pain    follow up   Subjective:    HPI  Neck pain/ Shoulder pain:  Patient was last seen 09/11/2015 for neck pain. MRI was ordered showing DDD with spur pushing against spinal nerve causing pain. Patient was prescribed Prednisone and was referred to Neurosurgery for further evaluation and treatment. Patient states she stopped taking the prednisone because it made her sick to her stomach. She states hydrocodone helps quite a bit. Pain is sometimes severe but moderate after taking hydrocodone. Still radiates into left arm.      Allergies  Allergen Reactions  . Aspirin     nausea with vomiting.  . Sulfa Antibiotics     hives.  . Penicillins Rash   Previous Medications   AMLODIPINE (NORVASC) 10 MG TABLET    Take 1 tablet (10 mg total) by mouth daily. 1(one) oral daily   BUPROPION (WELLBUTRIN SR) 150 MG 12 HR TABLET    Take 1 tablet by mouth 2 (two) times daily.   CLONAZEPAM (KLONOPIN) 0.5 MG TABLET    Take 1 tablet by mouth 2 (two) times daily as needed.   GABAPENTIN (NEURONTIN) 300 MG CAPSULE    Take 1 capsule by mouth 3 (three) times daily.   HYDROCODONE-ACETAMINOPHEN (NORCO) 7.5-325 MG TABLET    Take 1 tablet by mouth every 6 (six) hours as needed for moderate pain.   MELOXICAM (MOBIC) 15 MG TABLET    Take 15 mg by mouth. Daily with food   PAROXETINE (PAXIL) 40 MG TABLET    Take 1 tablet by mouth daily.   PROMETHAZINE (PHENERGAN) 25 MG TABLET    TAKE 1 TABLET EVERY 4 TO 6 HOURS AS NEEDED FOR NAUSEA   RANITIDINE (ZANTAC) 150 MG TABLET    RANITIDINE HCL, 150MG  (Oral Tablet)  1 Two Times A Day for heartburn, indigestion, nausea for 0 days  Quantity: 60.00;  Refills: 5   Ordered :22-Jul-2010  Reginia Forts MD;  Buddy Duty 28-Jan-2010 Active Comments: DX: 787.02   SIMVASTATIN (ZOCOR) 20 MG  TABLET    Take 1 tablet (20 mg total) by mouth daily.   TRAMADOL (ULTRAM) 50 MG TABLET    TAKE 1 TABLET BY MOUTH EVERY 8 HOURS AS NEEDED   VITAMIN D, ERGOCALCIFEROL, (DRISDOL) 50000 UNITS CAPS CAPSULE    Take 1 capsule by mouth once a week.    Review of Systems  Constitutional: Negative for fever, chills, appetite change and fatigue.  Respiratory: Negative for chest tightness and shortness of breath.   Cardiovascular: Negative for chest pain and palpitations.  Gastrointestinal: Negative for nausea, vomiting and abdominal pain.  Musculoskeletal: Positive for arthralgias (left shoulder pain), neck pain and neck stiffness.  Neurological: Negative for dizziness and weakness.    Social History  Substance Use Topics  . Smoking status: Current Every Day Smoker  . Smokeless tobacco: Not on file     Comment: Current Every day smoker.Has been smoking for 28+ yrs. Quit in 2004 with Welbutrin; has cut back to 3 cigarettes daily.  . Alcohol Use: No   Objective:   BP 136/82 mmHg  Pulse 78  Temp(Src) 97.7 F (36.5 C) (Oral)  Resp 16  Ht 5\' 6"  (1.676 m)  Wt 178 lb (80.74 kg)  BMI 28.74 kg/m2  SpO2 98%  Physical Exam  General appearance: alert, well developed, well nourished, cooperative and in no distress Head: Normocephalic, without obvious abnormality, atraumatic  Neurologic: Mental status: Alert, oriented to person, place, and time, thought content appropriate.     Assessment & Plan:     1. Compression injury of cervical spinal nerve Intolerant to prednisone, try decadron. Follow up with NS as scheduled.  - dexamethasone (DECADRON) 2 MG tablet; Take 1 tablet (2 mg total) by mouth 2 (two) times daily as needed.  Dispense: 30 tablet; Refill: 1  2. Left hand paresthesia Hydrocodone remains effective for pain control.  - HYDROcodone-acetaminophen (NORCO) 7.5-325 MG tablet; Take 1 tablet by mouth every 6 (six) hours as needed for moderate pain.  Dispense: 30 tablet; Refill: 0   3.  Migraine history.     - gabapentin (NEURONTIN) 300 MG capsule; Take 1 capsule (300 mg total) by mouth 3 (three) times daily.  Dispense: 90 capsule; Refill: 3   Lelon Huh, MD  [facility]

## 2015-10-28 DIAGNOSIS — M542 Cervicalgia: Secondary | ICD-10-CM | POA: Diagnosis not present

## 2015-10-28 DIAGNOSIS — M4722 Other spondylosis with radiculopathy, cervical region: Secondary | ICD-10-CM | POA: Diagnosis not present

## 2015-10-28 DIAGNOSIS — M5412 Radiculopathy, cervical region: Secondary | ICD-10-CM | POA: Diagnosis not present

## 2015-11-02 ENCOUNTER — Other Ambulatory Visit: Payer: Self-pay | Admitting: Family Medicine

## 2015-11-05 ENCOUNTER — Telehealth: Payer: Self-pay | Admitting: Family Medicine

## 2015-11-05 DIAGNOSIS — S149XXA Injury of unspecified nerves of neck, initial encounter: Secondary | ICD-10-CM

## 2015-11-05 NOTE — Telephone Encounter (Signed)
Pt need refill HYDROcodone-acetaminophen (NORCO) 7.5-325 MG tablet   Thanks teri

## 2015-11-06 MED ORDER — HYDROCODONE-ACETAMINOPHEN 7.5-325 MG PO TABS: 1.0000 | ORAL_TABLET | Freq: Four times a day (QID) | ORAL | Status: DC | PRN

## 2015-11-20 ENCOUNTER — Other Ambulatory Visit: Payer: Self-pay | Admitting: Family Medicine

## 2015-11-20 DIAGNOSIS — S149XXA Injury of unspecified nerves of neck, initial encounter: Secondary | ICD-10-CM

## 2015-11-20 MED ORDER — HYDROCODONE-ACETAMINOPHEN 7.5-325 MG PO TABS: 1.0000 | ORAL_TABLET | Freq: Four times a day (QID) | ORAL | Status: DC | PRN

## 2015-11-20 NOTE — Telephone Encounter (Signed)
Pt contacted office for refill request on the following medications: ° °HYDROcodone-acetaminophen (NORCO) 7.5-325 MG tablet ° °CB#[phone]/MW °

## 2015-11-21 ENCOUNTER — Telehealth: Payer: Self-pay | Admitting: Family Medicine

## 2015-11-21 ENCOUNTER — Other Ambulatory Visit: Payer: Self-pay | Admitting: Neurosurgery

## 2015-11-21 MED ORDER — PREDNISONE 10 MG (21) PO TBPK: 10.0000 mg | ORAL_TABLET | Freq: Every day | ORAL | Status: DC

## 2015-11-21 NOTE — Telephone Encounter (Signed)
Patient advised and verbally voiced understanding. FYI: patient wants to let Dr. Caryn Section know that the Neurosurgeon office called her just now and they have scheduled her surgery for 12/24/2015 at [facility].

## 2015-11-21 NOTE — Telephone Encounter (Signed)
Patient states that you gave her dexamethasone (DECADRON) 2 MG tablet and it makes her sweat really bad and she can not sleep like that.  She states that the medication does help but she can not deal with the sweating.  She would like to know if you can send something else in.  [facility] uses [facility].

## 2015-11-21 NOTE — Telephone Encounter (Signed)
Left message to call back. Medication sent into the pharmacy.

## 2015-11-21 NOTE — Telephone Encounter (Signed)
Can change to a 12 day prednisone taper. 10mg  6 for 2 days, 5 for 2 days 4 for 2 days, 3 for 2 days 2 for 2 days 1 for 2days, #42

## 2015-12-03 ENCOUNTER — Other Ambulatory Visit: Payer: Self-pay | Admitting: Family Medicine

## 2015-12-03 DIAGNOSIS — S149XXA Injury of unspecified nerves of neck, initial encounter: Secondary | ICD-10-CM

## 2015-12-03 MED ORDER — HYDROCODONE-ACETAMINOPHEN 7.5-325 MG PO TABS: 1.0000 | ORAL_TABLET | Freq: Four times a day (QID) | ORAL | Status: DC | PRN

## 2015-12-03 NOTE — Telephone Encounter (Signed)
Pt contacted office for refill request on the following medications: ° °HYDROcodone-acetaminophen (NORCO) 7.5-325 MG tablet ° °CB#[phone]/MW °

## 2015-12-15 NOTE — Pre-Procedure Instructions (Addendum)
PATRICK VENESS  12/15/2015      [id number] [address], [place] [address] [place] [address] Phone: [phone] Fax: [phone]    Your procedure is scheduled on Wednesday March 1st, 2017.  Report to [facility] Admitting at 6:30 A.M.  Call this number if you have problems the morning of surgery:  [phone]   Remember:  Do not eat food or drink liquids after midnight.   Take these medicines the morning of surgery with A SIP OF WATER amlodipine (norvasc), bupropion (wellbutrin), clonazepam (klonopin) if needed, dexamethasone (decadron) if needed, gabapentin (neurontin) if needed, hydrocodone-acetaminophen (norco) if needed, paroxetine (paxil) if needed, promethazine (phenergan) if needed, ranitidine (zantac) if needed, tramadol (if needed)  STOP: ALL Vitamins, Supplements, Effient and Herbal Medications, Fish Oils, Aspirins, NSAIDs (Nonsteroidal Anti-inflammatories such as Meloxicam, Ibuprofen, Aleve, or Advil), and Goody's/BC Powders 7 days prior to surgery, until after surgery as directed by your physician.    Do not wear jewelry, make-up or nail polish.  Do not wear lotions, powders, or perfumes.    Do not shave 48 hours prior to surgery.    Do not bring valuables to the hospital.  [facility] is not responsible for any belongings or valuables.  Contacts, dentures or bridgework may not be worn into surgery.  Leave your suitcase in the car.  After surgery it may be brought to your room.  For patients admitted to the hospital, discharge time will be determined by your treatment team.  Patients discharged the day of surgery will not be allowed to drive home.        Preparing for Surgery at [facility]  Before surgery, you can play an important role.  Because skin is not sterile, your skin needs to be as free of germs as possible.  You can reduce the number of germs on your skin by washing with CHG (chlorahexidine gluconate) Soap  before surgery.  CHG is an antiseptic cleaner with kills germs and bonds with the skin to continue killing germs even after washing.   Please do not use if you have an allergy to CHG or antibacterial soaps.  If your skin becomes reddened/irritated stop using the CHG.  Do not shave (including legs and underarms) for at least 48 hours prior to first CHG shower.  It is okay to shave your face.  Please follow these instructions carefully:  1. Shower with CHG Soap the night before surgery and the morning of Surgery. 2. If you choose to wash your hair, wash your hair first as usual with your normal shampoo. 3. After you shampoo, rinse your hair and body thoroughly to remove the Shampoo. 4. Use CHG as you would any other liquid soap. You can apply chg directly to the skin and wash gently with scrungie or a clean washcloth. 5. Apply the CHG Soap to your body ONLY FROM THE NECK DOWN. Do not use on open wounds or open sores. Avoid contact with your eyes, ears, mouth and genitals (private parts). Wash genitals (private parts) with your normal soap. 6. Wash thoroughly, paying special attention to the area where your surgery will be performed. 7. Thoroughly rinse your body with warm water from the neck down. 8. DO NOT shower/wash with your normal soap after using and rinsing off the CHG Soap. 9. Pat yourself dry with a clean towel.  10. Wear clean pajamas.  11. Place clean sheets on your bed the night of  your first shower and do not sleep with pets.  Day of Surgery  Do not apply any lotions/deodorants the morning of surgery. Please wear clean clothes to the hospital/surgery center.   Please read over the following fact sheets that you were given. Pain Booklet, Coughing and Deep Breathing, Blood Transfusion Information, MRSA Information and Surgical Site Infection  Prevention

## 2015-12-16 ENCOUNTER — Other Ambulatory Visit: Payer: Self-pay | Admitting: Family Medicine

## 2015-12-16 ENCOUNTER — Encounter (HOSPITAL_COMMUNITY): Payer: Self-pay

## 2015-12-16 ENCOUNTER — Encounter (HOSPITAL_COMMUNITY)
Admission: RE | Admit: 2015-12-16 | Discharge: 2015-12-16 | Disposition: A | Discharge status: Home / Self Care | Payer: 59 | Source: Ambulatory Visit | Attending: Neurosurgery | Admitting: Neurosurgery

## 2015-12-16 DIAGNOSIS — K219 Gastro-esophageal reflux disease without esophagitis: Secondary | ICD-10-CM | POA: Diagnosis not present

## 2015-12-16 DIAGNOSIS — E785 Hyperlipidemia, unspecified: Secondary | ICD-10-CM | POA: Diagnosis not present

## 2015-12-16 DIAGNOSIS — Z01818 Encounter for other preprocedural examination: Secondary | ICD-10-CM | POA: Insufficient documentation

## 2015-12-16 DIAGNOSIS — Z01812 Encounter for preprocedural laboratory examination: Secondary | ICD-10-CM | POA: Diagnosis not present

## 2015-12-16 DIAGNOSIS — M4722 Other spondylosis with radiculopathy, cervical region: Secondary | ICD-10-CM | POA: Insufficient documentation

## 2015-12-16 DIAGNOSIS — R9431 Abnormal electrocardiogram [ECG] [EKG]: Secondary | ICD-10-CM | POA: Diagnosis not present

## 2015-12-16 DIAGNOSIS — S149XXA Injury of unspecified nerves of neck, initial encounter: Secondary | ICD-10-CM

## 2015-12-16 DIAGNOSIS — F17200 Nicotine dependence, unspecified, uncomplicated: Secondary | ICD-10-CM | POA: Insufficient documentation

## 2015-12-16 DIAGNOSIS — I10 Essential (primary) hypertension: Secondary | ICD-10-CM | POA: Diagnosis not present

## 2015-12-16 DIAGNOSIS — Z79899 Other long term (current) drug therapy: Secondary | ICD-10-CM | POA: Insufficient documentation

## 2015-12-16 HISTORY — DX: Gastro-esophageal reflux disease without esophagitis: K21.9

## 2015-12-16 HISTORY — DX: Personal history of urinary calculi: Z87.442

## 2015-12-16 HISTORY — DX: Other specified postprocedural states: Z98.890

## 2015-12-16 HISTORY — DX: Family history of other specified conditions: Z84.89

## 2015-12-16 HISTORY — DX: Other specified postprocedural states: R11.2

## 2015-12-16 HISTORY — DX: Anxiety disorder, unspecified: F41.9

## 2015-12-16 LAB — SURGICAL PCR SCREEN
MRSA, PCR: NEGATIVE
STAPHYLOCOCCUS AUREUS: NEGATIVE

## 2015-12-16 LAB — CBC
HCT: 42.8 % (ref 36.0–46.0)
Hemoglobin: 13.8 g/dL (ref 12.0–15.0)
MCH: 28.3 pg (ref 26.0–34.0)
MCHC: 32.2 g/dL (ref 30.0–36.0)
MCV: 87.9 fL (ref 78.0–100.0)
PLATELETS: 293 10*3/uL (ref 150–400)
RBC: 4.87 MIL/uL (ref 3.87–5.11)
RDW: 13.9 % (ref 11.5–15.5)
WBC: 14.8 10*3/uL — AB (ref 4.0–10.5)

## 2015-12-16 LAB — BASIC METABOLIC PANEL
ANION GAP: 9 (ref 5–15)
BUN: 10 mg/dL (ref 6–20)
CO2: 24 mmol/L (ref 22–32)
Calcium: 9.7 mg/dL (ref 8.9–10.3)
Chloride: 110 mmol/L (ref 101–111)
Creatinine, Ser: 0.93 mg/dL (ref 0.44–1.00)
Glucose, Bld: 107 mg/dL — ABNORMAL HIGH (ref 65–99)
POTASSIUM: 3.7 mmol/L (ref 3.5–5.1)
SODIUM: 143 mmol/L (ref 135–145)

## 2015-12-16 MED ORDER — HYDROCODONE-ACETAMINOPHEN 7.5-325 MG PO TABS: 1.0000 | ORAL_TABLET | Freq: Four times a day (QID) | ORAL | Status: DC | PRN

## 2015-12-16 NOTE — Progress Notes (Signed)
PCP - Dr. Lelon Huh Cardiologist - denies  EKG - 12/16/15 CXR - denies  Echo/stress test/cardiac cath - denies  Patient denies chest pain and shortness of breath at PAT appointment.    Patient became tearful during PAT appointment when speaking of loss of sister (2016) and brother (2015).  Patient stated that she has had a difficult time coping but her PCP is aware.  Patient denies thoughts of harming herself or anyone else.  Per patient request, spiritual care consult requested for admission to hospital after surgery.

## 2015-12-16 NOTE — Telephone Encounter (Signed)
Pt needs refill HYDROcodone-acetaminophen (NORCO) 7.5-325 MG tablet  Call back [phone]   Thanks Con Memos

## 2015-12-17 NOTE — Progress Notes (Signed)
Anesthesia Chart Review:  Pt is a 55 year old female scheduled for C5-6, C6-7 ACDF on 12/24/2015 with Dr. Arnoldo Morale.   PMH includes:  HTN, hyperlipidemia, GERD, post-op N/V. Current smoker. BMI 31.   Medications include: amlodipine, dexamethasone, zantac, simvastatin  Preoperative labs reviewed.  WBC 14.8. Left voicemail for Manuela Schwartz in Dr. Arnoldo Morale office with this lab result.   EKG 12/16/15:  NSR. Cannot rule out Anterior infarct, age undetermined   If no changes, I anticipate pt can proceed with surgery as scheduled.   Willeen Cass, FNP-BC [facility]/Anesthesiology Phone: [phone] 12/17/2015 11:34 AM

## 2015-12-24 ENCOUNTER — Encounter (HOSPITAL_COMMUNITY)
Admission: RE | Disposition: A | Discharge status: Home / Self Care | Payer: Self-pay | Source: Ambulatory Visit | Attending: Neurosurgery

## 2015-12-24 ENCOUNTER — Inpatient Hospital Stay (HOSPITAL_COMMUNITY)
Admission: RE | Admit: 2015-12-24 | Discharge: 2015-12-24 | DRG: 473 | Disposition: A | Discharge status: Home / Self Care | Payer: 59 | Source: Ambulatory Visit | Attending: Neurosurgery | Admitting: Neurosurgery

## 2015-12-24 ENCOUNTER — Encounter (HOSPITAL_COMMUNITY): Payer: Self-pay | Admitting: Certified Registered Nurse Anesthetist

## 2015-12-24 ENCOUNTER — Inpatient Hospital Stay (HOSPITAL_COMMUNITY): Payer: 59 | Admitting: Emergency Medicine

## 2015-12-24 ENCOUNTER — Inpatient Hospital Stay (HOSPITAL_COMMUNITY): Payer: 59

## 2015-12-24 ENCOUNTER — Inpatient Hospital Stay (HOSPITAL_COMMUNITY): Payer: 59 | Admitting: Anesthesiology

## 2015-12-24 DIAGNOSIS — M4722 Other spondylosis with radiculopathy, cervical region: Secondary | ICD-10-CM | POA: Diagnosis not present

## 2015-12-24 DIAGNOSIS — M4712 Other spondylosis with myelopathy, cervical region: Secondary | ICD-10-CM | POA: Diagnosis not present

## 2015-12-24 DIAGNOSIS — Z419 Encounter for procedure for purposes other than remedying health state, unspecified: Secondary | ICD-10-CM

## 2015-12-24 DIAGNOSIS — M542 Cervicalgia: Secondary | ICD-10-CM | POA: Diagnosis present

## 2015-12-24 DIAGNOSIS — Z886 Allergy status to analgesic agent status: Secondary | ICD-10-CM | POA: Diagnosis not present

## 2015-12-24 DIAGNOSIS — Z79899 Other long term (current) drug therapy: Secondary | ICD-10-CM | POA: Diagnosis not present

## 2015-12-24 DIAGNOSIS — Z882 Allergy status to sulfonamides status: Secondary | ICD-10-CM | POA: Diagnosis not present

## 2015-12-24 DIAGNOSIS — M4802 Spinal stenosis, cervical region: Secondary | ICD-10-CM | POA: Diagnosis not present

## 2015-12-24 DIAGNOSIS — E7800 Pure hypercholesterolemia, unspecified: Secondary | ICD-10-CM | POA: Diagnosis present

## 2015-12-24 DIAGNOSIS — K219 Gastro-esophageal reflux disease without esophagitis: Secondary | ICD-10-CM | POA: Diagnosis present

## 2015-12-24 DIAGNOSIS — M1990 Unspecified osteoarthritis, unspecified site: Secondary | ICD-10-CM | POA: Diagnosis not present

## 2015-12-24 DIAGNOSIS — F17210 Nicotine dependence, cigarettes, uncomplicated: Secondary | ICD-10-CM | POA: Diagnosis not present

## 2015-12-24 DIAGNOSIS — Z90710 Acquired absence of both cervix and uterus: Secondary | ICD-10-CM

## 2015-12-24 DIAGNOSIS — M5032 Other cervical disc degeneration, mid-cervical region, unspecified level: Secondary | ICD-10-CM | POA: Diagnosis not present

## 2015-12-24 DIAGNOSIS — Z880 Allergy status to penicillin: Secondary | ICD-10-CM | POA: Diagnosis not present

## 2015-12-24 DIAGNOSIS — I10 Essential (primary) hypertension: Secondary | ICD-10-CM | POA: Diagnosis not present

## 2015-12-24 HISTORY — PX: ANTERIOR CERVICAL DECOMP/DISCECTOMY FUSION: SHX1161

## 2015-12-24 SURGERY — ANTERIOR CERVICAL DECOMPRESSION/DISCECTOMY FUSION 2 LEVELS
Anesthesia: General

## 2015-12-24 MED ORDER — DIAZEPAM 5 MG PO TABS
5.0000 mg | ORAL_TABLET | Freq: Four times a day (QID) | ORAL | Status: DC | PRN
  Administered 2015-12-24: 5 mg via ORAL
  Filled 2015-12-24: qty 1

## 2015-12-24 MED ORDER — PROPOFOL 10 MG/ML IV BOLUS
INTRAVENOUS | Status: AC
  Filled 2015-12-24: qty 40

## 2015-12-24 MED ORDER — LACTATED RINGERS IV SOLN: INTRAVENOUS | Status: DC

## 2015-12-24 MED ORDER — SUGAMMADEX SODIUM 500 MG/5ML IV SOLN
INTRAVENOUS | Status: DC | PRN
  Administered 2015-12-24: 168.6 mg via INTRAVENOUS

## 2015-12-24 MED ORDER — ONDANSETRON HCL 4 MG/2ML IJ SOLN
INTRAMUSCULAR | Status: DC | PRN
  Administered 2015-12-24: 4 mg via INTRAVENOUS

## 2015-12-24 MED ORDER — PROPOFOL 10 MG/ML IV BOLUS
INTRAVENOUS | Status: DC | PRN
  Administered 2015-12-24: 150 mg via INTRAVENOUS

## 2015-12-24 MED ORDER — CYCLOBENZAPRINE HCL 10 MG PO TABS: 10.0000 mg | ORAL_TABLET | Freq: Three times a day (TID) | ORAL | Status: DC | PRN

## 2015-12-24 MED ORDER — CLONAZEPAM 0.5 MG PO TABS: 0.5000 mg | ORAL_TABLET | Freq: Two times a day (BID) | ORAL | Status: DC | PRN

## 2015-12-24 MED ORDER — PAROXETINE HCL 20 MG PO TABS
40.0000 mg | ORAL_TABLET | Freq: Every day | ORAL | Status: DC
  Administered 2015-12-24: 40 mg via ORAL
  Filled 2015-12-24: qty 2

## 2015-12-24 MED ORDER — MIDAZOLAM HCL 2 MG/2ML IJ SOLN
INTRAMUSCULAR | Status: AC
  Filled 2015-12-24: qty 2

## 2015-12-24 MED ORDER — MORPHINE SULFATE (PF) 2 MG/ML IV SOLN
1.0000 mg | INTRAVENOUS | Status: DC | PRN
  Administered 2015-12-24: 4 mg via INTRAVENOUS
  Filled 2015-12-24: qty 2

## 2015-12-24 MED ORDER — ONDANSETRON HCL 4 MG/2ML IJ SOLN
4.0000 mg | INTRAMUSCULAR | Status: DC | PRN
  Administered 2015-12-24: 4 mg via INTRAVENOUS
  Filled 2015-12-24: qty 2

## 2015-12-24 MED ORDER — SODIUM CHLORIDE 0.9 % IR SOLN
Status: DC | PRN
  Administered 2015-12-24: 07:00:00

## 2015-12-24 MED ORDER — FAMOTIDINE 20 MG PO TABS: 20.0000 mg | ORAL_TABLET | Freq: Two times a day (BID) | ORAL | Status: DC

## 2015-12-24 MED ORDER — LACTATED RINGERS IV SOLN
INTRAVENOUS | Status: DC | PRN
  Administered 2015-12-24 (×3): via INTRAVENOUS

## 2015-12-24 MED ORDER — ACETAMINOPHEN 650 MG RE SUPP: 650.0000 mg | RECTAL | Status: DC | PRN

## 2015-12-24 MED ORDER — 0.9 % SODIUM CHLORIDE (POUR BTL) OPTIME
TOPICAL | Status: DC | PRN
  Administered 2015-12-24: 1000 mL

## 2015-12-24 MED ORDER — VANCOMYCIN HCL IN DEXTROSE 1-5 GM/200ML-% IV SOLN
1000.0000 mg | Freq: Once | INTRAVENOUS | Status: DC
  Filled 2015-12-24: qty 200

## 2015-12-24 MED ORDER — HYDROMORPHONE HCL 1 MG/ML IJ SOLN
INTRAMUSCULAR | Status: AC
  Administered 2015-12-24: 0.5 mg via INTRAVENOUS
  Filled 2015-12-24: qty 1

## 2015-12-24 MED ORDER — BACITRACIN ZINC 500 UNIT/GM EX OINT
TOPICAL_OINTMENT | CUTANEOUS | Status: DC | PRN
  Administered 2015-12-24: 1 via TOPICAL

## 2015-12-24 MED ORDER — SUGAMMADEX SODIUM 500 MG/5ML IV SOLN
INTRAVENOUS | Status: AC
  Filled 2015-12-24: qty 5

## 2015-12-24 MED ORDER — DEXAMETHASONE SODIUM PHOSPHATE 4 MG/ML IJ SOLN
4.0000 mg | Freq: Four times a day (QID) | INTRAMUSCULAR | Status: DC
  Administered 2015-12-24: 4 mg via INTRAVENOUS
  Filled 2015-12-24: qty 1

## 2015-12-24 MED ORDER — GABAPENTIN 300 MG PO CAPS
300.0000 mg | ORAL_CAPSULE | Freq: Three times a day (TID) | ORAL | Status: DC
  Administered 2015-12-24: 300 mg via ORAL
  Filled 2015-12-24: qty 1

## 2015-12-24 MED ORDER — OXYCODONE-ACETAMINOPHEN 5-325 MG PO TABS
1.0000 | ORAL_TABLET | ORAL | Status: DC | PRN
  Administered 2015-12-24: 2 via ORAL
  Filled 2015-12-24: qty 2

## 2015-12-24 MED ORDER — LABETALOL HCL 5 MG/ML IV SOLN
INTRAVENOUS | Status: DC | PRN
  Administered 2015-12-24: 5 mg via INTRAVENOUS

## 2015-12-24 MED ORDER — HYDROCODONE-ACETAMINOPHEN 5-325 MG PO TABS: 1.0000 | ORAL_TABLET | ORAL | Status: DC | PRN

## 2015-12-24 MED ORDER — MIDAZOLAM HCL 5 MG/5ML IJ SOLN
INTRAMUSCULAR | Status: DC | PRN
  Administered 2015-12-24: 2 mg via INTRAVENOUS

## 2015-12-24 MED ORDER — PHENYLEPHRINE HCL 10 MG/ML IJ SOLN
INTRAMUSCULAR | Status: DC | PRN
  Administered 2015-12-24 (×3): 80 ug via INTRAVENOUS

## 2015-12-24 MED ORDER — HYDROMORPHONE HCL 1 MG/ML IJ SOLN
0.2500 mg | INTRAMUSCULAR | Status: DC | PRN
  Administered 2015-12-24 (×2): 0.5 mg via INTRAVENOUS

## 2015-12-24 MED ORDER — VANCOMYCIN HCL IN DEXTROSE 1-5 GM/200ML-% IV SOLN
INTRAVENOUS | Status: AC
  Administered 2015-12-24: 1000 mg via INTRAVENOUS
  Filled 2015-12-24: qty 200

## 2015-12-24 MED ORDER — OXYCODONE HCL 5 MG/5ML PO SOLN: 5.0000 mg | Freq: Once | ORAL | Status: DC | PRN

## 2015-12-24 MED ORDER — PHENOL 1.4 % MT LIQD
1.0000 | OROMUCOSAL | Status: DC | PRN
  Administered 2015-12-24: 1 via OROMUCOSAL
  Filled 2015-12-24: qty 177

## 2015-12-24 MED ORDER — DOCUSATE SODIUM 100 MG PO CAPS: 100.0000 mg | ORAL_CAPSULE | Freq: Two times a day (BID) | ORAL | Status: DC

## 2015-12-24 MED ORDER — OXYCODONE HCL 5 MG PO TABS: 5.0000 mg | ORAL_TABLET | Freq: Once | ORAL | Status: DC | PRN

## 2015-12-24 MED ORDER — EPHEDRINE SULFATE 50 MG/ML IJ SOLN
INTRAMUSCULAR | Status: DC | PRN
  Administered 2015-12-24 (×2): 10 mg via INTRAVENOUS
  Administered 2015-12-24: 5 mg via INTRAVENOUS

## 2015-12-24 MED ORDER — DEXAMETHASONE 4 MG PO TABS
4.0000 mg | ORAL_TABLET | Freq: Four times a day (QID) | ORAL | Status: DC
  Administered 2015-12-24: 4 mg via ORAL
  Filled 2015-12-24: qty 1

## 2015-12-24 MED ORDER — PHENYLEPHRINE HCL 10 MG/ML IJ SOLN
10.0000 mg | INTRAMUSCULAR | Status: DC | PRN
  Administered 2015-12-24: 25 ug/min via INTRAVENOUS

## 2015-12-24 MED ORDER — THROMBIN 20000 UNITS EX SOLR
CUTANEOUS | Status: DC | PRN
  Administered 2015-12-24: 07:00:00 via TOPICAL

## 2015-12-24 MED ORDER — ONDANSETRON HCL 4 MG/2ML IJ SOLN: 4.0000 mg | Freq: Four times a day (QID) | INTRAMUSCULAR | Status: DC | PRN

## 2015-12-24 MED ORDER — SIMVASTATIN 20 MG PO TABS
20.0000 mg | ORAL_TABLET | Freq: Every day | ORAL | Status: DC
  Filled 2015-12-24: qty 1

## 2015-12-24 MED ORDER — BUPROPION HCL ER (SR) 150 MG PO TB12
150.0000 mg | ORAL_TABLET | Freq: Two times a day (BID) | ORAL | Status: DC
  Administered 2015-12-24: 150 mg via ORAL
  Filled 2015-12-24: qty 1

## 2015-12-24 MED ORDER — BUPIVACAINE-EPINEPHRINE (PF) 0.5% -1:200000 IJ SOLN
INTRAMUSCULAR | Status: DC | PRN
  Administered 2015-12-24: 10 mL via PERINEURAL

## 2015-12-24 MED ORDER — ALUM & MAG HYDROXIDE-SIMETH 200-200-20 MG/5ML PO SUSP: 30.0000 mL | Freq: Four times a day (QID) | ORAL | Status: DC | PRN

## 2015-12-24 MED ORDER — FENTANYL CITRATE (PF) 250 MCG/5ML IJ SOLN
INTRAMUSCULAR | Status: AC
  Filled 2015-12-24: qty 5

## 2015-12-24 MED ORDER — ROCURONIUM BROMIDE 100 MG/10ML IV SOLN
INTRAVENOUS | Status: DC | PRN
  Administered 2015-12-24: 50 mg via INTRAVENOUS
  Administered 2015-12-24 (×2): 10 mg via INTRAVENOUS

## 2015-12-24 MED ORDER — VANCOMYCIN HCL IN DEXTROSE 1-5 GM/200ML-% IV SOLN: 1000.0000 mg | INTRAVENOUS | Status: DC

## 2015-12-24 MED ORDER — OXYCODONE-ACETAMINOPHEN 10-325 MG PO TABS: 1.0000 | ORAL_TABLET | ORAL | Status: DC | PRN

## 2015-12-24 MED ORDER — PROMETHAZINE HCL 25 MG PO TABS: 25.0000 mg | ORAL_TABLET | Freq: Four times a day (QID) | ORAL | Status: DC | PRN

## 2015-12-24 MED ORDER — DEXAMETHASONE SODIUM PHOSPHATE 10 MG/ML IJ SOLN
INTRAMUSCULAR | Status: DC | PRN
  Administered 2015-12-24: 10 mg via INTRAVENOUS

## 2015-12-24 MED ORDER — FENTANYL CITRATE (PF) 100 MCG/2ML IJ SOLN
INTRAMUSCULAR | Status: DC | PRN
  Administered 2015-12-24 (×2): 100 ug via INTRAVENOUS
  Administered 2015-12-24: 50 ug via INTRAVENOUS
  Administered 2015-12-24: 150 ug via INTRAVENOUS
  Administered 2015-12-24: 100 ug via INTRAVENOUS

## 2015-12-24 MED ORDER — ACETAMINOPHEN 325 MG PO TABS
650.0000 mg | ORAL_TABLET | ORAL | Status: DC | PRN
Start: 2015-12-24 — End: 2015-12-24

## 2015-12-24 MED ORDER — BISACODYL 10 MG RE SUPP: 10.0000 mg | Freq: Every day | RECTAL | Status: DC | PRN

## 2015-12-24 MED ORDER — LIDOCAINE HCL (CARDIAC) 20 MG/ML IV SOLN
INTRAVENOUS | Status: DC | PRN
  Administered 2015-12-24: 70 mg via INTRAVENOUS

## 2015-12-24 MED ORDER — MENTHOL 3 MG MT LOZG
1.0000 | LOZENGE | OROMUCOSAL | Status: DC | PRN
  Filled 2015-12-24: qty 9

## 2015-12-24 MED ORDER — AMLODIPINE BESYLATE 10 MG PO TABS: 10.0000 mg | ORAL_TABLET | Freq: Every day | ORAL | Status: DC

## 2015-12-24 SURGICAL SUPPLY — 58 items
APL SKNCLS STERI-STRIP NONHPOA (GAUZE/BANDAGES/DRESSINGS) ×1
BAG DECANTER FOR FLEXI CONT (MISCELLANEOUS) ×2 IMPLANT
BENZOIN TINCTURE PRP APPL 2/3 (GAUZE/BANDAGES/DRESSINGS) ×3 IMPLANT
BIT DRILL NEURO 2X3.1 SFT TUCH (MISCELLANEOUS) ×1 IMPLANT
BLADE SURG 15 STRL LF DISP TIS (BLADE) ×1 IMPLANT
BLADE SURG 15 STRL SS (BLADE) ×4
BLADE ULTRA TIP 2M (BLADE) ×2 IMPLANT
BRUSH SCRUB EZ PLAIN DRY (MISCELLANEOUS) ×2 IMPLANT
BUR BARREL STRAIGHT FLUTE 4.0 (BURR) ×2 IMPLANT
BUR MATCHSTICK NEURO 3.0 LAGG (BURR) ×2 IMPLANT
CANISTER SUCT 3000ML PPV (MISCELLANEOUS) ×2 IMPLANT
COVER MAYO STAND STRL (DRAPES) ×2 IMPLANT
DRAPE LAPAROTOMY 100X72 PEDS (DRAPES) ×2 IMPLANT
DRAPE MICROSCOPE LEICA (MISCELLANEOUS) IMPLANT
DRAPE POUCH INSTRU U-SHP 10X18 (DRAPES) ×2 IMPLANT
DRAPE SURG 17X23 STRL (DRAPES) ×4 IMPLANT
DRILL NEURO 2X3.1 SOFT TOUCH (MISCELLANEOUS) ×2
ELECT REM PT RETURN 9FT ADLT (ELECTROSURGICAL) ×2
ELECTRODE REM PT RTRN 9FT ADLT (ELECTROSURGICAL) ×1 IMPLANT
GAUZE SPONGE 4X4 12PLY STRL (GAUZE/BANDAGES/DRESSINGS) ×2 IMPLANT
GAUZE SPONGE 4X4 16PLY XRAY LF (GAUZE/BANDAGES/DRESSINGS) IMPLANT
GLOVE BIO SURGEON STRL SZ8 (GLOVE) ×3 IMPLANT
GLOVE BIO SURGEON STRL SZ8.5 (GLOVE) ×2 IMPLANT
GLOVE EXAM NITRILE LRG STRL (GLOVE) IMPLANT
GLOVE EXAM NITRILE MD LF STRL (GLOVE) IMPLANT
GLOVE EXAM NITRILE XL STR (GLOVE) IMPLANT
GLOVE EXAM NITRILE XS STR PU (GLOVE) IMPLANT
GLOVE INDICATOR 7.0 STRL GRN (GLOVE) ×2 IMPLANT
GLOVE INDICATOR 7.5 STRL GRN (GLOVE) ×2 IMPLANT
GLOVE SURG SS PI 7.0 STRL IVOR (GLOVE) ×5 IMPLANT
GOWN STRL REUS W/ TWL LRG LVL3 (GOWN DISPOSABLE) IMPLANT
GOWN STRL REUS W/ TWL XL LVL3 (GOWN DISPOSABLE) IMPLANT
GOWN STRL REUS W/TWL LRG LVL3 (GOWN DISPOSABLE)
GOWN STRL REUS W/TWL XL LVL3 (GOWN DISPOSABLE) ×6
KIT BASIN OR (CUSTOM PROCEDURE TRAY) ×2 IMPLANT
KIT ROOM TURNOVER OR (KITS) ×2 IMPLANT
MARKER SKIN DUAL TIP RULER LAB (MISCELLANEOUS) ×2 IMPLANT
NDL SPNL 18GX3.5 QUINCKE PK (NEEDLE) ×1 IMPLANT
NEEDLE HYPO 22GX1.5 SAFETY (NEEDLE) ×2 IMPLANT
NEEDLE SPNL 18GX3.5 QUINCKE PK (NEEDLE) ×2 IMPLANT
NS IRRIG 1000ML POUR BTL (IV SOLUTION) ×2 IMPLANT
PACK LAMINECTOMY NEURO (CUSTOM PROCEDURE TRAY) ×2 IMPLANT
PEEK S VISTA 7X11X14 (Peek) ×2 IMPLANT
PIN DISTRACTION 14MM (PIN) ×4 IMPLANT
PLATE ANT CERV XTEND 2 LV 28 (Plate) ×2 IMPLANT
PUTTY BIOACTIVE 5CC KINEX (Putty) ×1 IMPLANT
RUBBERBAND STERILE (MISCELLANEOUS) IMPLANT
SCREW XTD VAR 4.2 SELF TAP 12 (Screw) ×6 IMPLANT
SPONGE INTESTINAL PEANUT (DISPOSABLE) ×4 IMPLANT
SPONGE SURGIFOAM ABS GEL SZ50 (HEMOSTASIS) ×2 IMPLANT
STRIP CLOSURE SKIN 1/2X4 (GAUZE/BANDAGES/DRESSINGS) ×2 IMPLANT
SUT VIC AB 0 CT1 27 (SUTURE) ×2
SUT VIC AB 0 CT1 27XBRD ANTBC (SUTURE) ×1 IMPLANT
SUT VIC AB 3-0 SH 8-18 (SUTURE) ×2 IMPLANT
TAPE CLOTH SURG 4X10 WHT LF (GAUZE/BANDAGES/DRESSINGS) ×1 IMPLANT
TOWEL OR 17X24 6PK STRL BLUE (TOWEL DISPOSABLE) ×2 IMPLANT
TOWEL OR 17X26 10 PK STRL BLUE (TOWEL DISPOSABLE) ×2 IMPLANT
WATER STERILE IRR 1000ML POUR (IV SOLUTION) ×2 IMPLANT

## 2015-12-24 NOTE — Progress Notes (Signed)
Pharmacy Antibiotic Note  Jamie Williams is a 55 y.o. female admitted on 12/24/2015 with cervical decompression/diskectomy/fusion.  Pharmacy has been consulted for vancomycin dosing for surgical prophylaxis.  Patient received pre-op dose of vancomycin 1g IV at 0815 this morning. There is no drain in place.   Plan: -vancomycin 1g IV x1 to be given tonight at 2000 -no further doses needed  Height: 5\' 5"  (165.1 cm) Weight: 185 lb 14.4 oz (84.324 kg) IBW/kg (Calculated) : 57  Temp (24hrs), Avg:98.1 F (36.7 C), Min:97.3 F (36.3 C), Max:98.7 F (37.1 C)  No results for input(s): WBC, CREATININE, LATICACIDVEN, VANCOTROUGH, VANCOPEAK, VANCORANDOM, GENTTROUGH, GENTPEAK, GENTRANDOM, TOBRATROUGH, TOBRAPEAK, TOBRARND, AMIKACINPEAK, AMIKACINTROU, AMIKACIN in the last 168 hours.  Estimated Creatinine Clearance: 75 mL/min (by C-G formula based on Cr of 0.93).    Allergies  Allergen Reactions  . Aspirin     nausea with vomiting.  . Sulfa Antibiotics     hives.  . Penicillins Rash    Antimicrobials this admission: 3/1 vanc x2  Dose adjustments this admission: n/a  Microbiology results:   Thank you for allowing pharmacy to be a part of this patient's care.  Twinkle Sockwell 12/24/2015 12:52 PM

## 2015-12-24 NOTE — Progress Notes (Signed)
   12/24/15 1430  Clinical Encounter Type  Visited With Patient and family together;Health care provider  Visit Type Initial;Post-op;Psychological support;Social support  Referral From Patient  Spiritual Encounters  Spiritual Needs Grief support  Stress Factors  Patient Stress Factors Loss   Chaplain responded to a request to visit with a patient who has been struggling with grief. Patient lost her brother in the summer of 2015, and then her sister in 2016. She misses them and has days where it's hard for her to cope with their loss. Chaplain offered empathic listening, invited patient to share about deceased family members, offered support, and gave resources for the bereavement services with [place]. Chaplain services available as needed.   Jeri Lager, Chaplain 12/24/2015 3:05 PM

## 2015-12-24 NOTE — Discharge Summary (Signed)
Physician Discharge Summary  Patient ID: Jamie Williams MRN: [id number] DOB/AGE: 53-Jan-1962 53 y.o.  Admit date: 12/24/2015 Discharge date: 12/24/2015  Admission Diagnoses: C5-6 and C6-7 disc degeneration, spondylosis, stenosis, cervicalgia, cervical radiculopathy  Discharge Diagnoses: The same Active Problems:   Cervical spondylosis with radiculopathy   Discharged Condition: good  Hospital Course: I performed a C5-6 and C6-7 anterior cervical discectomy, fusion, and plating on the patient on 12/24/2015. The surgery went well.  The patient's postoperative course was unremarkable. On the evening of the surgery the patient requested discharge to home. She, and her husband, were given written and oral discharge instructions. All her questions were answered.  Consults: None Significant Diagnostic Studies: None Treatments: C5-6 and C6-7 anterior cervical discectomy, fusion, and plating. Discharge Exam: Blood pressure 124/65, pulse 78, temperature 97.7 F (36.5 C), temperature source Oral, resp. rate 18, height 5\' 5"  (1.651 m), weight 84.324 kg (185 lb 14.4 oz), SpO2 96 %. The patient is alert and pleasant. She looks well. Her dressing is clean and dry. There is no hematoma or shift. Her strength is grossly normal in all 4 extremities.  Disposition: Home  Discharge Instructions    Call MD for:  difficulty breathing, headache or visual disturbances    Complete by:  As directed      Call MD for:  extreme fatigue    Complete by:  As directed      Call MD for:  hives    Complete by:  As directed      Call MD for:  persistant dizziness or light-headedness    Complete by:  As directed      Call MD for:  persistant nausea and vomiting    Complete by:  As directed      Call MD for:  redness, tenderness, or signs of infection (pain, swelling, redness, odor or green/yellow discharge around incision site)    Complete by:  As directed      Call MD for:  severe uncontrolled pain    Complete  by:  As directed      Call MD for:  temperature >100.4    Complete by:  As directed      Diet - low sodium heart healthy    Complete by:  As directed      Discharge instructions    Complete by:  As directed   Call [phone] for a followup appointment. Take a stool softener while you are using pain medications.     Driving Restrictions    Complete by:  As directed   Do not drive for 2 weeks.     Increase activity slowly    Complete by:  As directed      Lifting restrictions    Complete by:  As directed   Do not lift more than 5 pounds. No excessive bending or twisting.     May shower / Bathe    Complete by:  As directed   He may shower after the pain she is removed 3 days after surgery. Leave the incision alone.     Remove dressing in 48 hours    Complete by:  As directed   Your stitches are under the scan and will dissolve by themselves. The Steri-Strips will fall off after you take a few showers. Do not rub back or pick at the wound, Leave the wound alone.            Medication List    STOP taking these medications  dexamethasone 2 MG tablet  Commonly known as:  DECADRON     HYDROcodone-acetaminophen 7.5-325 MG tablet  Commonly known as:  NORCO     meloxicam 15 MG tablet  Commonly known as:  MOBIC     traMADol 50 MG tablet  Commonly known as:  ULTRAM      TAKE these medications        amLODipine 10 MG tablet  Commonly known as:  NORVASC  Take 1 tablet (10 mg total) by mouth daily. 1(one) oral daily     clonazePAM 0.5 MG tablet  Commonly known as:  KLONOPIN  Take 1 tablet by mouth 2 (two) times daily as needed for anxiety.     cyclobenzaprine 10 MG tablet  Commonly known as:  FLEXERIL  Take 1 tablet (10 mg total) by mouth 3 (three) times daily as needed for muscle spasms.     docusate sodium 100 MG capsule  Commonly known as:  COLACE  Take 1 capsule (100 mg total) by mouth 2 (two) times daily.     gabapentin 300 MG capsule  Commonly known as:   NEURONTIN  Take 1 capsule (300 mg total) by mouth 3 (three) times daily.     oxyCODONE-acetaminophen 10-325 MG tablet  Commonly known as:  PERCOCET  Take 1 tablet by mouth every 4 (four) hours as needed for pain.     PAXIL 40 MG tablet  Generic drug:  PARoxetine  Take 1 tablet by mouth daily.     promethazine 25 MG tablet  Commonly known as:  PHENERGAN  TAKE 1 TABLET EVERY 4 TO 6 HOURS AS NEEDED FOR NAUSEA     ranitidine 150 MG tablet  Commonly known as:  ZANTAC  RANITIDINE HCL, 150MG  (Oral Tablet)  1 Two Times A Day for heartburn, indigestion, nausea for 0 days  Quantity: 60.00;  Refills: 5   Ordered :22-Jul-2010  Reginia Forts MD;  Buddy Duty 28-Jan-2010 Active Comments: DX: 787.02     simvastatin 20 MG tablet  Commonly known as:  ZOCOR  Take 1 tablet (20 mg total) by mouth daily.     Vitamin D (Ergocalciferol) 50000 units Caps capsule  Commonly known as:  DRISDOL  Take 50,000 Units by mouth once a week.     WELLBUTRIN SR 150 MG 12 hr tablet  Generic drug:  buPROPion  Take 1 tablet by mouth 2 (two) times daily.         SignedNewman Pies D 12/24/2015, 6:18 PM

## 2015-12-24 NOTE — Discharge Instructions (Signed)

## 2015-12-24 NOTE — Transfer of Care (Signed)
Immediate Anesthesia Transfer of Care Note  Patient: Jamie Williams  Procedure(s) Performed: Procedure(s): Cervical five-six, Cervical six-seven  Anterior cervical decompression/diskectomy/fusion/interbody prosthesis/plate (N/A)  Patient Location: PACU  Anesthesia Type:General  Level of Consciousness: awake, alert , oriented and patient cooperative  Airway & Oxygen Therapy: Patient Spontanous Breathing and Patient connected to nasal cannula oxygen  Post-op Assessment: Report given to RN, Post -op Vital signs reviewed and stable and Patient moving all extremities X 4  Post vital signs: Reviewed and stable  Last Vitals:  Filed Vitals:   12/24/15 0733  BP: 157/84  Pulse: 72  Temp: 36.9 C  Resp: 18    Complications: No apparent anesthesia complications

## 2015-12-24 NOTE — Progress Notes (Signed)
Pt doing well. Pt and husband given D/C instructions with Rx's, verbal understanding was provided. Pt's incision is clean and dry with no sign of infection. Pt's IV was removed prior to D/C. Pt D/C'd home via wheelchair @ 1845 per MD order. Pt is stable @ D/C and has no other needs at this time. Holli Humbles, RN

## 2015-12-24 NOTE — Anesthesia Preprocedure Evaluation (Signed)
Anesthesia Evaluation  Patient identified by MRN, date of birth, ID band Patient awake    Reviewed: Allergy & Precautions, NPO status , Patient's Chart, lab work & pertinent test results  History of Anesthesia Complications (+) PONV  Airway Mallampati: II   Neck ROM: full    Dental   Pulmonary COPD, Current Smoker,    breath sounds clear to auscultation       Cardiovascular hypertension,  Rhythm:regular Rate:Normal     Neuro/Psych  Headaches, Anxiety Depression  Neuromuscular disease    GI/Hepatic GERD  ,  Endo/Other  obese  Renal/GU      Musculoskeletal  (+) Arthritis ,   Abdominal   Peds  Hematology   Anesthesia Other Findings   Reproductive/Obstetrics                             Anesthesia Physical Anesthesia Plan  ASA: II  Anesthesia Plan: General   Post-op Pain Management:    Induction: Intravenous  Airway Management Planned: Oral ETT  Additional Equipment:   Intra-op Plan:   Post-operative Plan: Extubation in OR  Informed Consent: I have reviewed the patients History and Physical, chart, labs and discussed the procedure including the risks, benefits and alternatives for the proposed anesthesia with the patient or authorized representative who has indicated his/her understanding and acceptance.     Plan Discussed with: CRNA, Anesthesiologist and Surgeon  Anesthesia Plan Comments:         Anesthesia Quick Evaluation

## 2015-12-24 NOTE — H&P (Signed)
Subjective: Patient is a 55 year old black female who is complaining of neck and arm pain consistent with a cervical radiculopathy. She has failed medical management and was worked up with a cervical MRI which demonstrated stenosis at C5-6 and C6-7. I discussed the various treatment options with the patient. She has decided to proceed with surgery.   Past Medical History  Diagnosis Date  . History of chicken pox   . PONV (postoperative nausea and vomiting)     nausea  . Family history of adverse reaction to anesthesia     "sister had a difficult time waking up from mastectomy and passed away"   . Numbness and tingling in left arm   . Hypertension   . Depression   . Anxiety   . Hypercholesteremia   . Urinary frequency   . History of kidney stones   . GERD (gastroesophageal reflux disease)   . Headache     migraines  . Arthritis     hands    Past Surgical History  Procedure Laterality Date  . Abdominal hysterectomy  2011    Abdominal; Ovaries intact; CERVIX INTACT. Fibroids/dys menorrhea. [place]  . Head injuries  2006    surgery 2006 Cram/NS in [place]. Headaches with increased intracranial pressure. Repeat MRI in 2008 Negative  . Ulnar neuropathy Right 2004    Ulnar Neuropathy surgical revision. 2004-2008. [place].  . Ectopic pregnancy surgery  1991 and 1996    two Etopic preg.    Allergies  Allergen Reactions  . Aspirin     nausea with vomiting.  . Sulfa Antibiotics     hives.  . Penicillins Rash    Social History  Substance Use Topics  . Smoking status: Current Every Day Smoker -- 0.25 packs/day  . Smokeless tobacco: Not on file     Comment: Current Every day smoker.Has been smoking for 28+ yrs. Quit in 2004 with Welbutrin; has cut back to 3 cigarettes daily.  . Alcohol Use: 0.0 oz/week    0 Standard drinks or equivalent per week     Comment: rarely    Family History  Problem Relation Age of Onset  . Hypertension Mother   . Diabetes Mother     Type  2  . Breast cancer Mother 37  . Cancer Sister 75    Breast  . Diabetes Sister     type 2  . Breast cancer Sister 55  . Diabetes Sister   . Diabetes Sister   . Diabetes Sister     type 2   Prior to Admission medications   Medication Sig Start Date End Date Taking? Authorizing Provider  amLODipine (NORVASC) 10 MG tablet Take 1 tablet (10 mg total) by mouth daily. 1(one) oral daily 07/03/15  Yes [name], MD  buPROPion [facility] SR) 150 MG 12 hr tablet Take 1 tablet by mouth 2 (two) times daily. 10/10/14  Yes Historical Provider, MD  clonazePAM (KLONOPIN) 0.5 MG tablet Take 1 tablet by mouth 2 (two) times daily as needed for anxiety.  01/31/15  Yes Historical Provider, MD  dexamethasone (DECADRON) 2 MG tablet Take 1 tablet (2 mg total) by mouth 2 (two) times daily as needed. Patient taking differently: Take 2 mg by mouth 2 (two) times daily as needed (inflammation).  10/15/15  Yes [name], MD  gabapentin (NEURONTIN) 300 MG capsule Take 1 capsule (300 mg total) by mouth 3 (three) times daily. 10/15/15  Yes [name], MD  HYDROcodone-acetaminophen ([place]) 7.5-325 MG tablet  Take 1 tablet by mouth every 6 (six) hours as needed for moderate pain. 12/16/15  Yes [name], MD  meloxicam (MOBIC) 15 MG tablet Take 15 mg by mouth daily.  02/12/15  Yes Historical Provider, MD  PARoxetine (PAXIL) 40 MG tablet Take 1 tablet by mouth daily. 12/18/14  Yes Historical Provider, MD  promethazine (PHENERGAN) 25 MG tablet TAKE 1 TABLET EVERY 4 TO 6 HOURS AS NEEDED FOR NAUSEA 11/02/15  Yes [name], MD  ranitidine (ZANTAC) 150 MG tablet RANITIDINE HCL, 150MG  (Oral Tablet)  1 Two Times A Day for heartburn, indigestion, nausea for 0 days  Quantity: 60.00;  Refills: 5   Ordered :22-Jul-2010  Reginia Forts MD;  Started 28-Jan-2010 Active Comments: DX: 787.02 01/28/10  Yes Historical Provider, MD  simvastatin (ZOCOR) 20 MG tablet Take 1 tablet (20 mg total) by mouth daily. 07/03/15  Yes  [name], MD  traMADol (ULTRAM) 50 MG tablet TAKE 1 TABLET BY MOUTH EVERY 8 HOURS AS NEEDED Patient taking differently: TAKE 1 TABLET BY MOUTH EVERY 8 HOURS AS NEEDED FOR PAIN 04/04/15  Yes [name], MD  Vitamin D, Ergocalciferol, (DRISDOL) 50000 UNITS CAPS capsule Take 50,000 Units by mouth once a week.  07/04/12  Yes Historical Provider, MD     Review of Systems  Positive ROS: As above  All other systems have been reviewed and were otherwise negative with the exception of those mentioned in the HPI and as above.  Objective: Vital signs in last 24 hours: Temp:  [98.4 F (36.9 C)] 98.4 F (36.9 C) (03/01 0733) Pulse Rate:  [72] 72 (03/01 0733) Resp:  [18] 18 (03/01 0733) BP: (157)/(84) 157/84 mmHg (03/01 0733) SpO2:  [100 %] 100 % (03/01 0733) Weight:  [84.324 kg (185 lb 14.4 oz)] 84.324 kg (185 lb 14.4 oz) (03/01 0733)  General Appearance: Alert, cooperative, no distress, Head: Normocephalic, without obvious abnormality, atraumatic Eyes: PERRL, conjunctiva/corneas clear, EOM's intact,    Ears: Normal  Throat: Normal  Neck: Supple, symmetrical, trachea midline, no adenopathy; thyroid: No enlargement/tenderness/nodules; no carotid bruit or JVD Back: Symmetric, no curvature, ROM normal, no CVA tenderness Lungs: Clear to auscultation bilaterally, respirations unlabored Heart: Regular rate and rhythm, no murmur, rub or gallop Abdomen: Soft, non-tender,, no masses, no organomegaly Extremities: Extremities normal, atraumatic, no cyanosis or edema Pulses: 2+ and symmetric all extremities Skin: Skin color, texture, turgor normal, no rashes or lesions  NEUROLOGIC:   Mental status: alert and oriented, no aphasia, good attention span, Fund of knowledge/ memory ok Motor Exam - grossly normal Sensory Exam - grossly normal Reflexes:  Coordination - grossly normal Gait - grossly normal Balance - grossly normal Cranial Nerves: I: smell Not tested  II: visual acuity  OS:  Normal  OD: Normal   II: visual fields Full to confrontation  II: pupils Equal, round, reactive to light  III,VII: ptosis None  III,IV,VI: extraocular muscles  Full ROM  V: mastication Normal  V: facial light touch sensation  Normal  V,VII: corneal reflex  Present  VII: facial muscle function - upper  Normal  VII: facial muscle function - lower Normal  VIII: hearing Not tested  IX: soft palate elevation  Normal  IX,X: gag reflex Present  XI: trapezius strength  5/5  XI: sternocleidomastoid strength 5/5  XI: neck flexion strength  5/5  XII: tongue strength  Normal    Data Review Lab Results  Component Value Date   WBC 14.8* 12/16/2015   HGB 13.8  12/16/2015   HCT 42.8 12/16/2015   MCV 87.9 12/16/2015   PLT 293 12/16/2015   Lab Results  Component Value Date   NA 143 12/16/2015   K 3.7 12/16/2015   CL 110 12/16/2015   CO2 24 12/16/2015   BUN 10 12/16/2015   CREATININE 0.93 12/16/2015   GLUCOSE 107* 12/16/2015   Lab Results  Component Value Date   INR 0.9 11/23/2014    Assessment/Plan: C5-6 and C6-7 disc degeneration, spondylosis, foraminal stenosis, cervicalgia, cervical radiculopathy: I have discussed the situation with the patient. I have reviewed her imaging studies with her and pointed out the abnormalities. We have discussed the various treatment options including surgery. I have described the surgical treatment option of a C5-6 and C6-7 anterior cervical discectomy, fusion, and plating. I have shown her surgical models. We have discussed the risks, benefits, alternatives, and likelihood of achieving her goals with surgery. I have answered all the patient's questions. She has decided to proceed with surgery.   Ryu Cerreta D 12/24/2015 8:24 AM

## 2015-12-24 NOTE — Op Note (Signed)
Brief history: The patient is a 55 year old black female who has complained of neck and left arm pain consistent with a cervical radiculopathy. She has failed medical management and was worked up with a cervical MRI which demonstrated disc degeneration, spondylosis, stenosis, etc. at C5-6 and C6-7. I discussed the various treatment options with the patient including surgery. She has weighed the risks, benefits, and alternative surgery and decided proceed with a C5-6 and C6-7 anterior cervical discectomy, fusion, and plating.  Preoperative diagnosis: C5-6 and C6-7 disc degeneration, spondylosis, stenosis, cervicalgia, cervical radiculopathy  Postoperative diagnosis: The same  Procedure: C5-6 and C6-7 Anterior cervical discectomy/decompression; C5-6 and C6-7 interbody arthrodesis with local morcellized autograft bone and Kinnex bone graft extender; insertion of interbody prosthesis at C5-6 and C6-7 (Zimmer peek interbody prosthesis); anterior cervical plating from C5-C7 with globus titanium plate  Surgeon: Dr. Earle Gell  Asst.: Francesca Jewett  Anesthesia: Gen. endotracheal  Estimated blood loss: 100 mL  Drains: None  Complications: None  Description of procedure: The patient was brought to the operating room by the anesthesia team. General endotracheal anesthesia was induced. A roll was placed under the patient's shoulders to keep the neck in the neutral position. The patient's anterior cervical region was then prepared with Betadine scrub and Betadine solution. Sterile drapes were applied.  The area to be incised was then injected with Marcaine with epinephrine solution. I then used a scalpel to make a transverse incision in the patient's left anterior neck. I used the Metzenbaum scissors to divide the platysmal muscle and then to dissect medial to the sternocleidomastoid muscle, jugular vein, and carotid artery. I carefully dissected down towards the anterior cervical spine identifying the  esophagus and retracting it medially. Then using Kitner swabs to clear soft tissue from the anterior cervical spine. We then inserted a bent spinal needle into the upper exposed intervertebral disc space. We then obtained intraoperative radiographs confirm our location.  I then used electrocautery to detach the medial border of the longus colli muscle bilaterally from the C5-6 and C6-7 intervertebral disc spaces. I then inserted the Caspar self-retaining retractor underneath the longus colli muscle bilaterally to provide exposure.  We then incised the intervertebral disc at C5-6. We then performed a partial intervertebral discectomy with a pituitary forceps and the Karlin curettes. I then inserted distraction screws into the vertebral bodies at C5-6. We then distracted the interspace. We then used the high-speed drill to decorticate the vertebral endplates at [id number]-GRM, to drill away the remainder of the intervertebral disc, to drill away some posterior spondylosis, and to thin out the posterior longitudinal ligament. I then incised ligament with the arachnoid knife. We then removed the ligament with a Kerrison punches undercutting the vertebral endplates and decompressing the thecal sac. We then performed foraminotomies about the bilateral C6 nerve roots. This completed the decompression at this level.  We then repeated this procedure and an analogous fashion at C6-7 decompressing the thecal sac and the bilateral C7 nerve roots.  We now turned our to attention to the interbody fusion. We used the trial spacers to determine the appropriate size for the interbody prosthesis. We then pre-filled prosthesis with a combination of local morcellized autograft bone that we obtained during decompression as well as Kinnex bone graft extender. We then inserted the prosthesis into the distracted interspace at C5-6 and C6-7. We then removed the distraction screws. There was a good snug fit of the prosthesis in the  interspace.  Having completed the fusion we now turned attention  to the anterior spinal instrumentation. We used the high-speed drill to drill away some anterior spondylosis at the disc spaces so that the plate lay down flat. We selected the appropriate length titanium anterior cervical plate. We laid it along the anterior aspect of the vertebral bodies from C5-C7. We then drilled 12 mm holes at C5, C6 and C7. We then secured the plate to the vertebral bodies by placing two 12 mm self-tapping screws at C5, C6 and C7. We then obtained intraoperative radiograph. The demonstrating good position of the instrumentation. We therefore secured the screws the plate the locking each cam. This completed the instrumentation.  We then obtained hemostasis using bipolar electrocautery. We irrigated the wound out with bacitracin solution. We then removed the retractor. We inspected the esophagus for any damage. There was none apparent. We then reapproximated patient's platysmal muscle with interrupted 3-0 Vicryl suture. We then reapproximated the subcutaneous tissue with interrupted 3-0 Vicryl suture. The skin was reapproximated with Steri-Strips and benzoin. The wound was then covered with bacitracin ointment. A sterile dressing was applied. The drapes were removed. Patient was subsequently extubated by the anesthesia team and transported to the post anesthesia care unit in stable condition. All sponge instrument and needle counts were reportedly correct at the end of this case.

## 2015-12-24 NOTE — Progress Notes (Signed)
Subjective:  The patient is alert and pleasant. She looks well. She is in no apparent distress.  Objective: Vital signs in last 24 hours: Temp:  [97.3 F (36.3 C)-98.7 F (37.1 C)] 97.3 F (36.3 C) (03/01 1145) Pulse Rate:  [72-91] 81 (03/01 1145) Resp:  [12-28] 18 (03/01 1145) BP: (132-157)/(80-91) 138/80 mmHg (03/01 1145) SpO2:  [97 %-100 %] 99 % (03/01 1145) Weight:  [84.324 kg (185 lb 14.4 oz)] 84.324 kg (185 lb 14.4 oz) (03/01 0733)  Intake/Output from previous day:   Intake/Output this shift: Total I/O In: 1125 [I.V.:1125] Out: 125 [Blood:125]  Physical exam the patient is alert and pleasant. Her dressing is clean and dry. There is no evidence of hematoma or shift. She is moving all 4 extremities well.  Lab Results: No results for input(s): WBC, HGB, HCT, PLT in the last 72 hours. BMET No results for input(s): NA, K, CL, CO2, GLUCOSE, BUN, CREATININE, CALCIUM in the last 72 hours.  Studies/Results: Dg Cervical Spine 2-3 Views  12/24/2015  CLINICAL DATA:  Cervical disc disease. EXAM: CERVICAL SPINE - 2-3 VIEW COMPARISON:  10/03/2015 . FINDINGS: Metallic marker noted anteriorly at the C4-C5 level on fill 1. C5 through C7 anterior interbody fusion. C5-C6 disc degeneration with endplate osteophyte formation. IMPRESSION: C5 through C7 anterior interbody fusion. Prominent C5-C6 degenerative change. Electronically Signed   By: Marcello Moores  Register   On: 12/24/2015 11:44    Assessment/Plan: The patient is doing well. I spoke with her husband.  LOS: 0 days     Jaiona Simien D 12/24/2015, 12:01 PM

## 2015-12-24 NOTE — Anesthesia Procedure Notes (Signed)
Procedure Name: Intubation Date/Time: 12/24/2015 8:40 AM Performed by: Greggory Stallion, Dalynn Jhaveri L Pre-anesthesia Checklist: Patient identified, Emergency Drugs available, Suction available, Patient being monitored and Timeout performed Patient Re-evaluated:Patient Re-evaluated prior to inductionOxygen Delivery Method: Circle system utilized Preoxygenation: Pre-oxygenation with 100% oxygen Intubation Type: IV induction Ventilation: Mask ventilation without difficulty Laryngoscope Size: Mac and 3 Grade View: Grade I Tube type: Oral Tube size: 7.0 mm Number of attempts: 1 Airway Equipment and Method: Stylet Placement Confirmation: ETT inserted through vocal cords under direct vision,  breath sounds checked- equal and bilateral and positive ETCO2 Secured at: 21 cm Tube secured with: Tape Dental Injury: Teeth and Oropharynx as per pre-operative assessment

## 2015-12-24 NOTE — Anesthesia Postprocedure Evaluation (Signed)
Anesthesia Post Note  Patient: Jamie Williams  Procedure(s) Performed: Procedure(s) (LRB): Cervical five-six, Cervical six-seven  Anterior cervical decompression/diskectomy/fusion/interbody prosthesis/plate (N/A)  Patient location during evaluation: PACU Anesthesia Type: General Level of consciousness: awake and alert and patient cooperative Pain management: pain level controlled Vital Signs Assessment: post-procedure vital signs reviewed and stable Respiratory status: spontaneous breathing and respiratory function stable Cardiovascular status: stable Anesthetic complications: no    Last Vitals:  Filed Vitals:   12/24/15 1130 12/24/15 1145  BP: 132/91 138/80  Pulse: 84 81  Temp:  36.3 C  Resp: 12 18    Last Pain:  Filed Vitals:   12/24/15 1154  PainSc: [place]

## 2015-12-26 ENCOUNTER — Encounter (HOSPITAL_COMMUNITY): Payer: Self-pay | Admitting: Neurosurgery

## 2015-12-31 ENCOUNTER — Ambulatory Visit: Payer: Medicare Other | Admitting: Family Medicine

## 2016-01-09 ENCOUNTER — Other Ambulatory Visit: Payer: Self-pay | Admitting: Family Medicine

## 2016-01-20 DIAGNOSIS — I10 Essential (primary) hypertension: Secondary | ICD-10-CM | POA: Diagnosis not present

## 2016-01-20 DIAGNOSIS — M4722 Other spondylosis with radiculopathy, cervical region: Secondary | ICD-10-CM | POA: Diagnosis not present

## 2016-01-20 DIAGNOSIS — Z6830 Body mass index (BMI) 30.0-30.9, adult: Secondary | ICD-10-CM | POA: Diagnosis not present

## 2016-01-22 ENCOUNTER — Ambulatory Visit: Payer: Medicare Other | Admitting: Family Medicine

## 2016-01-26 ENCOUNTER — Ambulatory Visit (INDEPENDENT_AMBULATORY_CARE_PROVIDER_SITE_OTHER): Payer: 59 | Admitting: Family Medicine

## 2016-01-26 ENCOUNTER — Encounter: Payer: Self-pay | Admitting: Family Medicine

## 2016-01-26 VITALS — BP 130/82 | HR 79 | Temp 97.9°F | Resp 16 | Wt 184.0 lb

## 2016-01-26 DIAGNOSIS — M542 Cervicalgia: Secondary | ICD-10-CM | POA: Diagnosis not present

## 2016-01-26 DIAGNOSIS — E559 Vitamin D deficiency, unspecified: Secondary | ICD-10-CM | POA: Diagnosis not present

## 2016-01-26 DIAGNOSIS — E7800 Pure hypercholesterolemia, unspecified: Secondary | ICD-10-CM | POA: Diagnosis not present

## 2016-01-26 DIAGNOSIS — R7303 Prediabetes: Secondary | ICD-10-CM

## 2016-01-26 DIAGNOSIS — I10 Essential (primary) hypertension: Secondary | ICD-10-CM

## 2016-01-26 DIAGNOSIS — Z720 Tobacco use: Secondary | ICD-10-CM | POA: Diagnosis not present

## 2016-01-26 NOTE — Progress Notes (Signed)
Patient: Jamie Williams Female    DOB: 20-Dec-1960   55 y.o.   MRN: [id number] Visit Date: 01/26/2016  Today's Provider: Lelon Huh, MD   Chief Complaint  Patient presents with  . Hypertension    follow up  . Hyperglycemia    follow up  . Hyperlipidemia    follow up  . Depression    follow up   Subjective:    HPI  Hypertension, follow-up:  BP Readings from Last 3 Encounters:  12/24/15 124/65  12/16/15 160/85  10/15/15 136/82    She was last seen for hypertension 6 months ago.  BP at that visit was 130/80. Management since that visit includes no changes. She reports good compliance with treatment. She is not having side effects.  She is not exercising. She is adherent to low salt diet.   Outside blood pressures are 0000000 systolic reading. Diastolic usually run in the 80's. She is experiencing none.  Patient denies chest pain, chest pressure/discomfort, claudication, dyspnea, exertional chest pressure/discomfort, fatigue, irregular heart beat, lower extremity edema, near-syncope and orthopnea.   Cardiovascular risk factors include dyslipidemia and hypertension.  Use of agents associated with hypertension: none.     Weight trend: stable Wt Readings from Last 3 Encounters:  12/24/15 185 lb 14.4 oz (84.324 kg)  12/16/15 185 lb 14.4 oz (84.324 kg)  10/15/15 178 lb (80.74 kg)    Current diet: well balanced  ------------------------------------------------------------------------   Prediabetes, Follow-up:   Lab Results  Component Value Date   HGBA1C 5.7 07/03/2015   HGBA1C 5.8 01/08/2015   GLUCOSE 107* 12/16/2015   GLUCOSE 108* 11/23/2014   GLUCOSE 90 07/24/2014    Last seen for for this6 months ago.  Management since that visit includes no changes. Current symptoms include none and have been stable.  Weight trend: stable Prior visit with dietician: no Current diet: well balanced Current exercise: none  Pertinent Labs:    Component  Value Date/Time   CHOL 201* 01/08/2015   TRIG 110 01/08/2015   CREATININE 0.93 12/16/2015 1011   CREATININE 0.93 11/23/2014 1612   CREATININE 0.8 06/24/2014    Wt Readings from Last 3 Encounters:  12/24/15 185 lb 14.4 oz (84.324 kg)  12/16/15 185 lb 14.4 oz (84.324 kg)  10/15/15 178 lb (80.74 kg)    Lipid/Cholesterol, Follow-up:   Last seen for this6 months ago.  Management changes since that visit include none. . Last Lipid Panel:    Component Value Date/Time   CHOL 201* 01/08/2015   TRIG 110 01/08/2015   HDL 48 01/08/2015   [place] 131 01/08/2015    Risk factors for vascular disease include hypercholesterolemia, hypertension and smoking  She reports good compliance with treatment. She is not having side effects.  Current symptoms include none and have been stable. Weight trend: stable Prior visit with dietician: no Current diet: well balanced Current exercise: none  Wt Readings from Last 3 Encounters:  12/24/15 185 lb 14.4 oz (84.324 kg)  12/16/15 185 lb 14.4 oz (84.324 kg)  10/15/15 178 lb (80.74 kg)    ------------------------------------------------------------------- Follow up Depression:  Last office visit was 6 months ago and no changes were made. Patient reports good compliance with treatment and good symptom control.     Allergies  Allergen Reactions  . Aspirin     nausea with vomiting.  . Sulfa Antibiotics     hives.  . Penicillins Rash   Previous Medications   AMLODIPINE (NORVASC) 10 MG TABLET  Take 1 tablet (10 mg total) by mouth daily. 1(one) oral daily   BUPROPION (WELLBUTRIN SR) 150 MG 12 HR TABLET    Take 1 tablet by mouth 2 (two) times daily.   CLONAZEPAM (KLONOPIN) 0.5 MG TABLET    Take 1 tablet by mouth 2 (two) times daily as needed for anxiety.    CYCLOBENZAPRINE (FLEXERIL) 10 MG TABLET    Take 1 tablet (10 mg total) by mouth 3 (three) times daily as needed for muscle spasms.   DOCUSATE SODIUM (COLACE) 100 MG CAPSULE    Take 1  capsule (100 mg total) by mouth 2 (two) times daily.   GABAPENTIN (NEURONTIN) 300 MG CAPSULE    Take 1 capsule (300 mg total) by mouth 3 (three) times daily.   PAROXETINE (PAXIL) 40 MG TABLET    TAKE 1 TABLET DAILY BY MOUTH   PROMETHAZINE (PHENERGAN) 25 MG TABLET    TAKE 1 TABLET EVERY 4 TO 6 HOURS AS NEEDED FOR NAUSEA   RANITIDINE (ZANTAC) 150 MG TABLET    RANITIDINE HCL, 150MG  (Oral Tablet)  1 Two Times A Day for heartburn, indigestion, nausea for 0 days  Quantity: 60.00;  Refills: 5   Ordered :22-Jul-2010  Reginia Forts MD;  Buddy Duty 28-Jan-2010 Active Comments: DX: 787.02   SIMVASTATIN (ZOCOR) 20 MG TABLET    Take 1 tablet (20 mg total) by mouth daily.   VITAMIN D, ERGOCALCIFEROL, (DRISDOL) 50000 UNITS CAPS CAPSULE    Take 50,000 Units by mouth once a week.     Review of Systems  Constitutional: Negative for fever, chills, appetite change and fatigue.  Respiratory: Negative for chest tightness and shortness of breath.   Cardiovascular: Negative for chest pain and palpitations.  Gastrointestinal: Negative for nausea, vomiting and abdominal pain.  Endocrine: Positive for polyphagia. Negative for polydipsia and polyuria.  Neurological: Positive for light-headedness. Negative for dizziness, weakness and numbness.    Social History  Substance Use Topics  . Smoking status: Current Every Day Smoker -- 0.25 packs/day  . Smokeless tobacco: Not on file     Comment: Current Every day smoker.Has been smoking for 28+ yrs. Quit in 2004 with Welbutrin; has cut back to 3 cigarettes daily.  . Alcohol Use: 0.0 oz/week    0 Standard drinks or equivalent per week     Comment: rarely   Past Surgical History  Procedure Laterality Date  . Abdominal hysterectomy  2011    Abdominal; Ovaries intact; CERVIX INTACT. Fibroids/dys menorrhea. Weaver-Lee  . Head injuries  2006    surgery 2006 Cram/NS in [place]. Headaches with increased intracranial pressure. Repeat MRI in 2008 Negative  . Ulnar  neuropathy Right 2004    Ulnar Neuropathy surgical revision. 2004-2008. [place].  . Ectopic pregnancy surgery  1991 and 1996    two Etopic preg.  Marland Kitchen Anterior cervical decomp/discectomy fusion N/A 12/24/2015    Procedure: Cervical five-six, Cervical six-seven  Anterior cervical decompression/diskectomy/fusion/interbody prosthesis/plate;  Surgeon: Newman Pies, MD;  Location: [facility];  Service: Neurosurgery;  Laterality: N/A;    Objective:   BP 130/82 mmHg  Pulse 79  Temp(Src) 97.9 F (36.6 C) (Oral)  Resp 16  Wt 184 lb (83.462 kg)  SpO2 98%  Physical Exam   General Appearance:    Alert, cooperative, no distress  Eyes:    PERRL, conjunctiva/corneas clear, EOM's intact       Lungs:     Clear to auscultation bilaterally, respirations unlabored  Heart:    Regular rate and rhythm  Neurologic:   Awake, alert, oriented x 3. No apparent focal neurological           defect.           Assessment & Plan:     1. Vitamin D deficiency  - VITAMIN D 25 Hydroxy (Vit-D Deficiency, Fractures)  2. Benign essential HTN Well controlled.  Continue current medications.   - Renal function panel  3. Hypercholesterolemia without hypertriglyceridemia She is tolerating simvastatin well with no adverse effects.   - Lipid panel  4. Tobacco abuse Counseled to stop smoking alltogether  5. Pre-diabetes  - Hemoglobin A1c  6. Neck pain Greatly improved since surgery. Still taking 2-3 hydrocodone/apap 7.5/325 daily and wearing cervical collar prn.        Lelon Huh, MD  [facility]

## 2016-01-27 ENCOUNTER — Telehealth: Payer: Self-pay

## 2016-01-27 ENCOUNTER — Other Ambulatory Visit: Payer: Self-pay | Admitting: Family Medicine

## 2016-01-27 DIAGNOSIS — E7800 Pure hypercholesterolemia, unspecified: Secondary | ICD-10-CM

## 2016-01-27 DIAGNOSIS — S149XXA Injury of unspecified nerves of neck, initial encounter: Secondary | ICD-10-CM

## 2016-01-27 LAB — RENAL FUNCTION PANEL
Albumin: 4.3 g/dL (ref 3.5–5.5)
BUN / CREAT RATIO: 11 (ref 9–23)
BUN: 9 mg/dL (ref 6–24)
CALCIUM: 9.8 mg/dL (ref 8.7–10.2)
CHLORIDE: 105 mmol/L (ref 96–106)
CO2: 21 mmol/L (ref 18–29)
Creatinine, Ser: 0.82 mg/dL (ref 0.57–1.00)
GFR calc Af Amer: 94 mL/min/{1.73_m2} (ref >59)
GFR calc non Af Amer: 82 mL/min/{1.73_m2} (ref >59)
Glucose: 101 mg/dL — ABNORMAL HIGH (ref 65–99)
POTASSIUM: 4.5 mmol/L (ref 3.5–5.2)
Phosphorus: 3.4 mg/dL (ref 2.5–4.5)
SODIUM: 145 mmol/L — AB (ref 134–144)

## 2016-01-27 LAB — LIPID PANEL
CHOL/HDL RATIO: 5 ratio — AB (ref 0.0–4.4)
Cholesterol, Total: 240 mg/dL — ABNORMAL HIGH (ref 100–199)
HDL: 48 mg/dL (ref >39)
LDL CALC: 170 mg/dL — AB (ref 0–99)
Triglycerides: 109 mg/dL (ref 0–149)
VLDL Cholesterol Cal: 22 mg/dL (ref 5–40)

## 2016-01-27 LAB — HEMOGLOBIN A1C
Est. average glucose Bld gHb Est-mCnc: 131 mg/dL
HEMOGLOBIN A1C: 6.2 % — AB (ref 4.8–5.6)

## 2016-01-27 LAB — VITAMIN D 25 HYDROXY (VIT D DEFICIENCY, FRACTURES): VIT D 25 HYDROXY: 21.2 ng/mL — AB (ref 30.0–100.0)

## 2016-01-27 MED ORDER — HYDROCODONE-ACETAMINOPHEN 7.5-325 MG PO TABS: 1.0000 | ORAL_TABLET | Freq: Four times a day (QID) | ORAL | Status: DC | PRN

## 2016-01-27 MED ORDER — SIMVASTATIN 40 MG PO TABS: 40.0000 mg | ORAL_TABLET | Freq: Every day | ORAL | Status: DC

## 2016-01-27 NOTE — Telephone Encounter (Signed)
Left message to call back  

## 2016-01-27 NOTE — Telephone Encounter (Signed)
Patient reports that you call in hydrocodone to help with her arthritis pain. I did not see it on patient's med list. She is requesting a refill on medication. She reports that her arthritis has been worse than usual. Please call when ready. Thanks!

## 2016-01-27 NOTE — Telephone Encounter (Signed)
Advised patient as below. Medication was sent into pharmacy. Patient will call and schedule appt for 6 months.

## 2016-01-27 NOTE — Telephone Encounter (Signed)
-----   Message from [name], MD sent at 01/27/2016  8:01 AM EDT ----- Cholesterol is too high at 240. Need to increase simvastatin to 40mg  daily, #30, rf x 6. Should also be taking 81mg  coated aspirin daily. Average sugar is up a little bit to 131. This is getting close to diabetic range of 140. Avoid sweets and starchy foods and exercise 124minutes per week. Vitamin d levels a little low at 21. Need to continue current BP meds and vitamin D supplement. Follow up 6 months.

## 2016-01-28 NOTE — Telephone Encounter (Signed)
Please call in Tramadol

## 2016-01-28 NOTE — Telephone Encounter (Signed)
Rx called in to pharmacy. 

## 2016-02-12 ENCOUNTER — Other Ambulatory Visit: Payer: Self-pay | Admitting: Family Medicine

## 2016-02-12 DIAGNOSIS — S149XXA Injury of unspecified nerves of neck, initial encounter: Secondary | ICD-10-CM

## 2016-02-12 NOTE — Telephone Encounter (Signed)
Patient is requesting a refill on her HYDROcodone-acetaminophen ([place]) 7.5-325 MG tablet Please call when ready for pick up.  [phone]

## 2016-02-13 MED ORDER — HYDROCODONE-ACETAMINOPHEN 7.5-325 MG PO TABS: 1.0000 | ORAL_TABLET | Freq: Four times a day (QID) | ORAL | Status: DC | PRN

## 2016-02-27 ENCOUNTER — Other Ambulatory Visit: Payer: Self-pay | Admitting: Family Medicine

## 2016-02-27 DIAGNOSIS — S149XXA Injury of unspecified nerves of neck, initial encounter: Secondary | ICD-10-CM

## 2016-02-27 NOTE — Telephone Encounter (Signed)
Pt needs refill on her HYDROcodone-acetaminophen (NORCO) 7.5-325 MG tablet   Thanks, Con Memos

## 2016-02-28 MED ORDER — HYDROCODONE-ACETAMINOPHEN 7.5-325 MG PO TABS: 1.0000 | ORAL_TABLET | Freq: Four times a day (QID) | ORAL | Status: DC | PRN

## 2016-03-15 ENCOUNTER — Other Ambulatory Visit: Payer: Self-pay | Admitting: Family Medicine

## 2016-03-16 ENCOUNTER — Other Ambulatory Visit: Payer: Self-pay | Admitting: Family Medicine

## 2016-03-16 DIAGNOSIS — S149XXA Injury of unspecified nerves of neck, initial encounter: Secondary | ICD-10-CM

## 2016-03-16 MED ORDER — HYDROCODONE-ACETAMINOPHEN 7.5-325 MG PO TABS: 1.0000 | ORAL_TABLET | Freq: Four times a day (QID) | ORAL | Status: DC | PRN

## 2016-03-16 NOTE — Telephone Encounter (Signed)
Pt needs refill HYDROcodone-acetaminophen (NORCO) 7.5-325 MG tablet 02/28/16 03/27/16 Jamie Sons, MD Take 1 tablet by mouth every 6 (six) hours as needed for moderate pain.   Thanks Con Memos

## 2016-03-16 NOTE — Telephone Encounter (Signed)
Dr. Caryn Section Patient wants refill on Norco, last filled 02/28/16 for # 30

## 2016-03-30 ENCOUNTER — Other Ambulatory Visit: Payer: Self-pay | Admitting: Family Medicine

## 2016-03-30 DIAGNOSIS — S149XXA Injury of unspecified nerves of neck, initial encounter: Secondary | ICD-10-CM

## 2016-03-30 MED ORDER — HYDROCODONE-ACETAMINOPHEN 7.5-325 MG PO TABS: 1.0000 | ORAL_TABLET | Freq: Four times a day (QID) | ORAL | Status: DC | PRN

## 2016-03-30 NOTE — Telephone Encounter (Signed)
Pt contacted office for refill request on the following medications: HYDROcodone-acetaminophen (NORCO) 7.5-325 MG tablet. Last OV: 01/26/16 Last written: 03/16/16 Please advise. Thanks TNP

## 2016-04-01 ENCOUNTER — Other Ambulatory Visit: Payer: Self-pay | Admitting: Family Medicine

## 2016-04-12 ENCOUNTER — Other Ambulatory Visit: Payer: Self-pay | Admitting: Family Medicine

## 2016-04-12 DIAGNOSIS — S149XXA Injury of unspecified nerves of neck, initial encounter: Secondary | ICD-10-CM

## 2016-04-12 NOTE — Telephone Encounter (Signed)
Pt needs refill on her HYDROcodone-acetaminophen (NORCO) 7.5-325 MG tablet 03/30/16 04/27/16 [name], MD Take 1 tablet by mouth every 6 (six) hours as needed for moderate    Thanks Con Memos

## 2016-04-13 MED ORDER — HYDROCODONE-ACETAMINOPHEN 7.5-325 MG PO TABS: 1.0000 | ORAL_TABLET | Freq: Four times a day (QID) | ORAL | Status: DC | PRN

## 2016-04-26 ENCOUNTER — Other Ambulatory Visit: Payer: Self-pay | Admitting: Family Medicine

## 2016-04-28 NOTE — Telephone Encounter (Signed)
Please call in tramadol.  

## 2016-04-28 NOTE — Telephone Encounter (Signed)
Rx called in to pharmacy. 

## 2016-05-14 ENCOUNTER — Telehealth: Payer: Self-pay | Admitting: Family Medicine

## 2016-05-14 ENCOUNTER — Other Ambulatory Visit: Payer: Self-pay | Admitting: Family Medicine

## 2016-05-14 DIAGNOSIS — S149XXA Injury of unspecified nerves of neck, initial encounter: Secondary | ICD-10-CM

## 2016-05-14 MED ORDER — HYDROCODONE-ACETAMINOPHEN 7.5-325 MG PO TABS: 1.0000 | ORAL_TABLET | Freq: Four times a day (QID) | ORAL | Status: DC | PRN

## 2016-05-14 NOTE — Telephone Encounter (Signed)
Pt. Requesting a refill on the following medication.  Medication wasn't listed on Pt's medication list, pt did say she had gotten some of the same medication from another Dr also. Thanks CC  Hydrocodone 7.5-352 MG

## 2016-05-14 NOTE — Telephone Encounter (Signed)
Is this okay to refill?  Thanks,   -Mickel Baas

## 2016-05-27 ENCOUNTER — Other Ambulatory Visit: Payer: Self-pay | Admitting: Family Medicine

## 2016-05-27 DIAGNOSIS — S149XXA Injury of unspecified nerves of neck, initial encounter: Secondary | ICD-10-CM

## 2016-05-27 NOTE — Telephone Encounter (Signed)
Pt contacted office for refill request on the following medications:  HYDROcodone-acetaminophen (NORCO) 7.5-325 MG tablet.  CB#[id number]

## 2016-05-28 MED ORDER — HYDROCODONE-ACETAMINOPHEN 7.5-325 MG PO TABS: 1.0000 | ORAL_TABLET | Freq: Four times a day (QID) | ORAL | 0 refills | Status: DC | PRN

## 2016-06-06 ENCOUNTER — Emergency Department: Payer: 59

## 2016-06-06 ENCOUNTER — Encounter: Payer: Self-pay | Admitting: Emergency Medicine

## 2016-06-06 ENCOUNTER — Emergency Department
Admission: EM | Admit: 2016-06-06 | Discharge: 2016-06-07 | Disposition: A | Discharge status: Home / Self Care | Payer: 59 | Attending: Emergency Medicine | Admitting: Emergency Medicine

## 2016-06-06 DIAGNOSIS — F17200 Nicotine dependence, unspecified, uncomplicated: Secondary | ICD-10-CM | POA: Diagnosis not present

## 2016-06-06 DIAGNOSIS — R05 Cough: Secondary | ICD-10-CM | POA: Diagnosis present

## 2016-06-06 DIAGNOSIS — M791 Myalgia, unspecified site: Secondary | ICD-10-CM

## 2016-06-06 DIAGNOSIS — J449 Chronic obstructive pulmonary disease, unspecified: Secondary | ICD-10-CM | POA: Diagnosis not present

## 2016-06-06 DIAGNOSIS — R110 Nausea: Secondary | ICD-10-CM | POA: Insufficient documentation

## 2016-06-06 DIAGNOSIS — I10 Essential (primary) hypertension: Secondary | ICD-10-CM | POA: Insufficient documentation

## 2016-06-06 DIAGNOSIS — J40 Bronchitis, not specified as acute or chronic: Secondary | ICD-10-CM | POA: Insufficient documentation

## 2016-06-06 MED ORDER — ONDANSETRON HCL 4 MG/2ML IJ SOLN
4.0000 mg | Freq: Once | INTRAMUSCULAR | Status: AC
  Administered 2016-06-06: 4 mg via INTRAVENOUS
  Filled 2016-06-06: qty 2

## 2016-06-06 MED ORDER — ACETAMINOPHEN 325 MG PO TABS: 650.0000 mg | ORAL_TABLET | Freq: Once | ORAL | Status: DC | PRN

## 2016-06-06 MED ORDER — IBUPROFEN 600 MG PO TABS
600.0000 mg | ORAL_TABLET | Freq: Once | ORAL | Status: AC
  Administered 2016-06-06: 600 mg via ORAL

## 2016-06-06 MED ORDER — IBUPROFEN 400 MG PO TABS
ORAL_TABLET | ORAL | Status: AC
  Administered 2016-06-06: 600 mg via ORAL
  Filled 2016-06-06: qty 2

## 2016-06-06 MED ORDER — HYDROCOD POLST-CPM POLST ER 10-8 MG/5ML PO SUER
5.0000 mL | Freq: Once | ORAL | Status: AC
  Administered 2016-06-06: 5 mL via ORAL
  Filled 2016-06-06: qty 5

## 2016-06-06 NOTE — ED Provider Notes (Signed)
[facility] Emergency Department Provider Note   ____________________________________________   First MD Initiated Contact with Patient 06/06/16 2305     (approximate)  I have reviewed the triage vital signs and the nursing notes.   HISTORY  Chief Complaint Nasal Congestion; URI; and Shortness of Breath    HPI Jamie Williams is a 55 y.o. female who comes into the hospital today with coughing, chills and stuffy head. She reports that she feels bad and she hurts all over. The patient reports that her stuffy nose started on Thursday.The patient reports that she felt worse yesterday and the day. She is also had some nausea. The patient has not taken any medication at home for her symptoms. She did take some Phenergan for nausea. She's had a productive cough which was greenish brown. She denies any sick contacts. She is unsure she's had knee fevers at home. She is breathing hard and feeling a little short of breath. She denies any abdominal pain or problems with urination but has felt lightheaded. The patient rates her pain a 20 out of 10 in intensity. The patient decided to come in today because she felt so bad at home.   Past Medical History:  Diagnosis Date  . Anxiety   . Arthritis    hands  . Depression   . Family history of adverse reaction to anesthesia    "sister had a difficult time waking up from mastectomy and passed away"   . GERD (gastroesophageal reflux disease)   . Headache    migraines  . History of chicken pox   . History of kidney stones   . Hypercholesteremia   . Hypertension   . Numbness and tingling in left arm   . PONV (postoperative nausea and vomiting)    nausea  . Urinary frequency     Patient Active Problem List   Diagnosis Date Noted  . Cervical spondylosis with radiculopathy 12/24/2015  . Compression injury of cervical spinal nerve 10/07/2015  . Left arm pain 08/29/2015  . Neck pain 08/29/2015  . Hematuria 07/03/2015    . Allergic arthritis 06/27/2015  . Arthritis 06/27/2015  . Back ache 06/27/2015  . Clinical depression 06/27/2015  . H/O abnormal cervical Papanicolaou smear 06/27/2015  . Elevated intracranial pressure 06/27/2015  . Kidney stones 06/27/2015  . Knee pain 06/27/2015  . Abnormal mammogram 06/27/2015  . Headache, migraine 06/27/2015  . Neuropathy ([place]) 06/27/2015  . Hand paresthesia 06/27/2015  . Pre-diabetes 06/27/2015  . Vitamin D deficiency 06/27/2015  . Weight loss 06/27/2015  . Adjustment reaction 06/27/2015  . COPD (chronic obstructive pulmonary disease) ([place]) 02/24/2011  . Hypercholesterolemia without hypertriglyceridemia 01/29/2010  . Tobacco abuse 01/28/2010  . Benign essential HTN 09/16/2009  . Insomnia 09/16/2009  . Lesion of ulnar nerve 09/16/2009    Past Surgical History:  Procedure Laterality Date  . ABDOMINAL HYSTERECTOMY  2011   Abdominal; Ovaries intact; CERVIX INTACT. Fibroids/dys menorrhea. [place]  . ANTERIOR CERVICAL DECOMP/DISCECTOMY FUSION N/A 12/24/2015   Procedure: Cervical five-six, Cervical six-seven  Anterior cervical decompression/diskectomy/fusion/interbody prosthesis/plate;  Surgeon: Newman Pies, MD;  Location: [place] NEURO ORS;  Service: Neurosurgery;  Laterality: N/A;  . [place] and 1996   two Etopic preg.  Marland Kitchen Head Injuries  2006   surgery 2006 Cram/NS in [place]. Headaches with increased intracranial pressure. Repeat MRI in 2008 Negative  . Ulnar Neuropathy Right 2004   Ulnar Neuropathy surgical revision. 2004-2008. [place].    Prior to Admission medications  Medication Sig Start Date End Date Taking? Authorizing Provider  albuterol (PROVENTIL HFA;VENTOLIN HFA) 108 (90 Base) MCG/ACT inhaler Inhale 2 puffs into the lungs every 6 (six) hours as needed for wheezing or shortness of breath. 06/07/16   Loney Hering, MD  amLODipine (NORVASC) 10 MG tablet Take 1 tablet (10 mg total) by mouth daily. 1(one) oral daily  07/03/15   [name], MD  azithromycin (ZITHROMAX Z-PAK) 250 MG tablet Take 2 tablets (500 mg) on  Day 1,  followed by 1 tablet (250 mg) once daily on Days 2 through 5. 06/07/16 06/12/16  Loney Hering, MD  buPROPion [facility] SR) 150 MG 12 hr tablet TAKE 1 TABLET BY MOUTH TWICE A DAY 04/01/16   [name], MD  chlorpheniramine-HYDROcodone (TUSSIONEX PENNKINETIC ER) 10-8 MG/5ML SUER Take 5 mLs by mouth 2 (two) times daily. 06/07/16   Loney Hering, MD  clonazePAM (KLONOPIN) 0.5 MG tablet Take 1 tablet by mouth 2 (two) times daily as needed for anxiety.  01/31/15   Historical Provider, MD  cyclobenzaprine (FLEXERIL) 10 MG tablet Take 1 tablet (10 mg total) by mouth 3 (three) times daily as needed for muscle spasms. 12/24/15   Newman Pies, MD  docusate sodium (COLACE) 100 MG capsule Take 1 capsule (100 mg total) by mouth 2 (two) times daily. 12/24/15   Newman Pies, MD  gabapentin (NEURONTIN) 300 MG capsule Take 1 capsule (300 mg total) by mouth 3 (three) times daily. 10/15/15   [name], MD  HYDROcodone-acetaminophen (NORCO) 7.5-325 MG tablet Take 1 tablet by mouth every 6 (six) hours as needed for moderate pain. 05/28/16   [name], MD  meloxicam (MOBIC) 15 MG tablet TAKE 1 TABLET BY MOUTH DAILY WITH FOOD 03/15/16   [name], MD  PARoxetine (PAXIL) 40 MG tablet TAKE 1 TABLET DAILY BY MOUTH 01/09/16   [name], MD  predniSONE (DELTASONE) 20 MG tablet Take 3 tablets (60 mg total) by mouth daily. 06/07/16   Loney Hering, MD  promethazine (PHENERGAN) 25 MG tablet TAKE 1 TABLET EVERY 4 TO 6 HOURS AS NEEDED FOR NAUSEA 01/28/16   [name], MD  ranitidine (ZANTAC) 150 MG tablet RANITIDINE HCL, 150MG  (Oral Tablet)  1 Two Times A Day for heartburn, indigestion, nausea for 0 days  Quantity: 60.00;  Refills: 5   Ordered :22-Jul-2010  Reginia Forts MD;  Started 28-Jan-2010 Active Comments: DX: 787.02 01/28/10   Historical Provider, MD  simvastatin (ZOCOR) 40 MG  tablet Take 1 tablet (40 mg total) by mouth daily. 01/27/16   [name], MD  traMADol (ULTRAM) 50 MG tablet TAKE 1 TABLET BY MOUTH EVERY 8 HOURS AS NEEDED 04/28/16   [name], MD  Vitamin D, Ergocalciferol, (DRISDOL) 50000 UNITS CAPS capsule Take 50,000 Units by mouth once a week.  07/04/12   Historical Provider, MD    Allergies Aspirin; Sulfa antibiotics; and Penicillins  Family History  Problem Relation Age of Onset  . Hypertension Mother   . Diabetes Mother     Type 2  . Breast cancer Mother 60  . Cancer Sister 65    Breast  . Diabetes Sister     type 2  . Breast cancer Sister 48  . Diabetes Sister   . Diabetes Sister   . Diabetes Sister     type 2    Social History Social History  Substance Use Topics  . Smoking status: Current Every Day Smoker  Packs/day: 0.25  . Smokeless tobacco: Not on file     Comment: Current Every day smoker.Has be    n  nnn             . Alcohol use 0.0 oz/week     Comment: rarely    Review of Systems Constitutional:  fever/chills Eyes: Watery eyes ENT: No sore throat. Cardiovascular: Denies chest pain. Respiratory: Cough and shortness of breath. Gastrointestinal: Nausea with No abdominal pain.  no vomiting.  No diarrhea.  No constipation. Genitourinary: Negative for dysuria. Musculoskeletal: Body aches Skin: Negative for rash. Neurological: Light headedness  10-point ROS otherwise negative.  ____________________________________________   PHYSICAL EXAM:  VITAL SIGNS: ED Triage Vitals  Enc Vitals Group     BP 06/06/16 2153 (!) 145/87     Pulse Rate 06/06/16 2153 95     Resp 06/06/16 2153 (!) 22     Temp 06/06/16 2153 (!) 100.7 F (38.2 C)     Temp Source 06/06/16 2153 Oral     SpO2 06/06/16 2153 99 %     Weight --      Height --      Head Circumference --      Peak Flow --      Pain Score 06/06/16 2154 10     Pain Loc --      Pain Edu? --      Excl. in [place]? --     Constitutional: Alert and oriented. Well  appearing and in Moderate distress. Eyes: Conjunctivae are normal. PERRL. EOMI. Head: Atraumatic. Nose: No congestion/rhinnorhea. Mouth/Throat: Mucous membranes are moist.  Oropharynx non-erythematous. Cardiovascular: Normal rate, regular rhythm. Grossly normal heart sounds.  Good peripheral circulation. Respiratory: Normal respiratory effort.  No retractions. Lungs CTAB. Gastrointestinal: Soft and nontender. No distention. Positive bowel sounds Musculoskeletal: No lower extremity tenderness nor edema.  . Neurologic:  Normal speech and language.  Skin:  Skin is warm, dry and intact.  Psychiatric: Mood and affect are normal.   ____________________________________________   LABS (all labs ordered are listed, but only abnormal results are displayed)  Labs Reviewed  CBC - Abnormal; Notable for the following:       Result Value   WBC 13.7 (*)    All other components within normal limits  COMPREHENSIVE METABOLIC PANEL - Abnormal; Notable for the following:    Glucose, Bld 106 (*)    Alkaline Phosphatase 129 (*)    All other components within normal limits  LIPASE, BLOOD  TROPONIN I   ____________________________________________  EKG  ED ECG REPORT I, Loney Hering, the attending physician, personally viewed and interpreted this ECG.   Date: 06/06/2016  EKG Time: 2216  Rate: 90  Rhythm: normal sinus rhythm  Axis: Normal  Intervals:none  ST&T Change: Flipped T waves in leads II, III, aVF, V3, V4, V5, V6  ____________________________________________  RADIOLOGY  CXR ____________________________________________   PROCEDURES  Procedure(s) performed: None  Procedures  Critical Care performed: No  ____________________________________________   INITIAL IMPRESSION / ASSESSMENT AND PLAN / ED COURSE  Pertinent labs & imaging results that were available during my care of the patient were reviewed by me and considered in my medical decision making (see chart for  details).  This is a 55 year old female who comes into the hospital today feeling unwell. The patient has had some stuffy nose, body aches fevers documented here and cough. I will do some blood work and sent the patient for a chest x-ray. I will give her some  ibuprofen and Zofran and then reassess her symptoms.  Clinical Course  Value Comment By Time  DG Chest 2 View Peribronchial thickening noted.  Mild bibasilar atelectasis noted. Loney Hering, MD 08/14 [phone]    Did return to the patient's room to reassess her and she reports that she feels much better. She is sitting up and she feels well. I gave her a dose of prednisone and an albuterol treatment. Her shortness of breath is improved, her cough is improved. The patient also received a dose of Tussionex for her cough. The patient will be discharged home to follow-up with her primary care physician. I feel that the patient has bronchitis which is why she has the fevers and the myalgias as well as the cough is productive of greenish sputum. The patient will be discharged home with some prescriptions for her bronchitis. As she is a smoker I will also give her a Z-Pak. ____________________________________________   FINAL CLINICAL IMPRESSION(S) / ED DIAGNOSES  Final diagnoses:  Bronchitis  Myalgia      NEW MEDICATIONS STARTED DURING THIS VISIT:  New Prescriptions   ALBUTEROL (PROVENTIL HFA;VENTOLIN HFA) 108 (90 BASE) MCG/ACT INHALER    Inhale 2 puffs into the lungs every 6 (six) hours as needed for wheezing or shortness of breath.   AZITHROMYCIN (ZITHROMAX Z-PAK) 250 MG TABLET    Take 2 tablets (500 mg) on  Day 1,  followed by 1 tablet (250 mg) once daily on Days 2 through 5.   CHLORPHENIRAMINE-HYDROCODONE (TUSSIONEX PENNKINETIC ER) 10-8 MG/5ML SUER    Take 5 mLs by mouth 2 (two) times daily.   PREDNISONE (DELTASONE) 20 MG TABLET    Take 3 tablets (60 mg total) by mouth daily.     Note:  This document was prepared using Dragon voice  recognition software and may include unintentional dictation errors.    Loney Hering, MD 06/07/16 [phone]

## 2016-06-06 NOTE — ED Triage Notes (Signed)
Pt presents to ed C/O "stuffy head that has progressed since yesterday". Pt has chills and nausea. "my body hurts".

## 2016-06-06 NOTE — ED Notes (Signed)
Pt c/o of nasal congestion/drainage beginning Thursday. Pt c/o sore throat, headache, pain with inspiration, productive cough and nausea beginning Saturday.

## 2016-06-07 DIAGNOSIS — J40 Bronchitis, not specified as acute or chronic: Secondary | ICD-10-CM | POA: Diagnosis not present

## 2016-06-07 LAB — TROPONIN I

## 2016-06-07 LAB — COMPREHENSIVE METABOLIC PANEL
ALT: 15 U/L (ref 14–54)
ANION GAP: 7 (ref 5–15)
AST: 18 U/L (ref 15–41)
Albumin: 4.2 g/dL (ref 3.5–5.0)
Alkaline Phosphatase: 129 U/L — ABNORMAL HIGH (ref 38–126)
BUN: 10 mg/dL (ref 6–20)
CHLORIDE: 109 mmol/L (ref 101–111)
CO2: 22 mmol/L (ref 22–32)
CREATININE: 0.86 mg/dL (ref 0.44–1.00)
Calcium: 9.2 mg/dL (ref 8.9–10.3)
GFR calc non Af Amer: >60 mL/min (ref >60)
Glucose, Bld: 106 mg/dL — ABNORMAL HIGH (ref 65–99)
Potassium: 3.7 mmol/L (ref 3.5–5.1)
SODIUM: 138 mmol/L (ref 135–145)
Total Bilirubin: 0.9 mg/dL (ref 0.3–1.2)
Total Protein: 7.3 g/dL (ref 6.5–8.1)

## 2016-06-07 LAB — CBC
HCT: 42.6 % (ref 35.0–47.0)
Hemoglobin: 14.9 g/dL (ref 12.0–16.0)
MCH: 29.8 pg (ref 26.0–34.0)
MCHC: 35 g/dL (ref 32.0–36.0)
MCV: 85.2 fL (ref 80.0–100.0)
PLATELETS: 281 10*3/uL (ref 150–440)
RBC: 5 MIL/uL (ref 3.80–5.20)
RDW: 13.4 % (ref 11.5–14.5)
WBC: 13.7 10*3/uL — ABNORMAL HIGH (ref 3.6–11.0)

## 2016-06-07 LAB — LIPASE, BLOOD: Lipase: 21 U/L (ref 11–51)

## 2016-06-07 MED ORDER — ALBUTEROL SULFATE (2.5 MG/3ML) 0.083% IN NEBU
2.5000 mg | INHALATION_SOLUTION | Freq: Once | RESPIRATORY_TRACT | Status: AC
  Administered 2016-06-07: 2.5 mg via RESPIRATORY_TRACT
  Filled 2016-06-07: qty 3

## 2016-06-07 MED ORDER — PREDNISONE 20 MG PO TABS
60.0000 mg | ORAL_TABLET | Freq: Once | ORAL | Status: AC
  Administered 2016-06-07: 60 mg via ORAL
  Filled 2016-06-07: qty 3

## 2016-06-07 MED ORDER — HYDROCOD POLST-CPM POLST ER 10-8 MG/5ML PO SUER: 5.0000 mL | Freq: Two times a day (BID) | ORAL | 0 refills | Status: DC

## 2016-06-07 MED ORDER — ALBUTEROL SULFATE HFA 108 (90 BASE) MCG/ACT IN AERS: 2.0000 | INHALATION_SPRAY | Freq: Four times a day (QID) | RESPIRATORY_TRACT | 0 refills | Status: DC | PRN

## 2016-06-07 MED ORDER — AZITHROMYCIN 250 MG PO TABS: ORAL_TABLET | ORAL | 0 refills | Status: AC

## 2016-06-07 MED ORDER — PREDNISONE 20 MG PO TABS: 60.0000 mg | ORAL_TABLET | Freq: Every day | ORAL | 0 refills | Status: DC

## 2016-06-07 MED ORDER — PREDNISONE 20 MG PO TABS
ORAL_TABLET | ORAL | Status: AC
  Filled 2016-06-07: qty 1

## 2016-06-07 NOTE — ED Notes (Signed)
Reviewed d/c instructions, follow-up care, prescriptions with pt. Pt verbalized understanding.

## 2016-06-07 NOTE — ED Notes (Signed)
Pt reports decreased SOB after breathing treatment. Crackles no longer heard on auscultation by RN

## 2016-06-08 ENCOUNTER — Telehealth: Payer: Self-pay | Admitting: Family Medicine

## 2016-06-08 NOTE — Telephone Encounter (Signed)
I've got two slots open tomorrow afternoon. Mikki Santee has plenty of open slots.

## 2016-06-08 NOTE — Telephone Encounter (Signed)
Pt went to ER Sunday with bronchitis.  They want her to follow up sometime the end of this week but I do not se any appts available.  Please advise.  Thanks,teri

## 2016-06-09 ENCOUNTER — Ambulatory Visit (INDEPENDENT_AMBULATORY_CARE_PROVIDER_SITE_OTHER): Payer: 59 | Admitting: Family Medicine

## 2016-06-09 ENCOUNTER — Encounter: Payer: Self-pay | Admitting: Family Medicine

## 2016-06-09 VITALS — BP 150/80 | HR 78 | Temp 98.1°F | Ht 65.0 in | Wt 185.0 lb

## 2016-06-09 DIAGNOSIS — G588 Other specified mononeuropathies: Secondary | ICD-10-CM

## 2016-06-09 DIAGNOSIS — F329 Major depressive disorder, single episode, unspecified: Secondary | ICD-10-CM

## 2016-06-09 DIAGNOSIS — F32A Depression, unspecified: Secondary | ICD-10-CM

## 2016-06-09 DIAGNOSIS — S149XXA Injury of unspecified nerves of neck, initial encounter: Secondary | ICD-10-CM

## 2016-06-09 DIAGNOSIS — J40 Bronchitis, not specified as acute or chronic: Secondary | ICD-10-CM | POA: Diagnosis not present

## 2016-06-09 MED ORDER — HYDROCODONE-ACETAMINOPHEN 7.5-325 MG PO TABS: 1.0000 | ORAL_TABLET | Freq: Four times a day (QID) | ORAL | 0 refills | Status: DC | PRN

## 2016-06-09 NOTE — Progress Notes (Signed)
Patient: Jamie Williams Female    DOB: [birth year]   55 y.o.   MRN: [id number] Visit Date: 06/09/2016  Today's Provider: Lelon Huh, MD   Chief Complaint  Patient presents with  . Follow-up    ER due to bronchitis   Subjective:    HPI  Follow up ER visit  Patient was seen in ER for nasla congestion; URI and shortness of breath on 06/06/2016. She was treated for Bronchitis and Myalgia . Treatment for this included Albuterol inhaler; Azithromycin 250 mg; Tussionex and prednisone. She reports excellent compliance with treatment. She reports this condition has improved. Patient reports that cough is worse at night. Patient reports coughing up green mucus.  She states every time she takes cough syrup it triggers a coughing spell.  ------------------------------------------------------------------------------------     Depression, Follow-up  She  was last seen for this 10 months ago. Changes made at last visit include no changes.   She reports excellent compliance with treatment. She is not having side effects.   She reports excellent tolerance of treatment. Current symptoms include: depressed mood and difficulty concentrating She feels she is Improved since last visit. Patient is requesting refill on Paroxetine 40 mg.  ------------------------------------------------------------------------   Follow up for neck pain  The patient was last seen for this 4 months ago. Changes made at last visit include no changes.  She reports excellent compliance with treatment. She feels that condition is Improved. She is not having side effects.  Patient is requesting refill for Hydrocodone-acetaminophen and gabapentin.  ------------------------------------------------------------------------------------    Allergies  Allergen Reactions  . Aspirin     nausea with vomiting.  . Sulfa Antibiotics     hives.  . Penicillins Rash   Current Meds  Medication Sig  .  albuterol (PROVENTIL HFA;VENTOLIN HFA) 108 (90 Base) MCG/ACT inhaler Inhale 2 puffs into the lungs every 6 (six) hours as needed for wheezing or shortness of breath.  Marland Kitchen amLODipine (NORVASC) 10 MG tablet Take 1 tablet (10 mg total) by mouth daily. 1(one) oral daily  . azithromycin (ZITHROMAX Z-PAK) 250 MG tablet Take 2 tablets (500 mg) on  Day 1,  followed by 1 tablet (250 mg) once daily on Days 2 through 5.  . buPROPion (WELLBUTRIN SR) 150 MG 12 hr tablet TAKE 1 TABLET BY MOUTH TWICE A DAY  . chlorpheniramine-HYDROcodone (TUSSIONEX PENNKINETIC ER) 10-8 MG/5ML SUER Take 5 mLs by mouth 2 (two) times daily.  . clonazePAM (KLONOPIN) 0.5 MG tablet Take 1 tablet by mouth 2 (two) times daily as needed for anxiety.   . cyclobenzaprine (FLEXERIL) 10 MG tablet Take 1 tablet (10 mg total) by mouth 3 (three) times daily as needed for muscle spasms.  Marland Kitchen gabapentin (NEURONTIN) 300 MG capsule Take 1 capsule (300 mg total) by mouth 3 (three) times daily.  Marland Kitchen HYDROcodone-acetaminophen (NORCO) 7.5-325 MG tablet Take 1 tablet by mouth every 6 (six) hours as needed for moderate pain.  . meloxicam (MOBIC) 15 MG tablet TAKE 1 TABLET BY MOUTH DAILY WITH FOOD  . PARoxetine (PAXIL) 40 MG tablet TAKE 1 TABLET DAILY BY MOUTH  . predniSONE (DELTASONE) 20 MG tablet Take 3 tablets (60 mg total) by mouth daily.  . promethazine (PHENERGAN) 25 MG tablet TAKE 1 TABLET EVERY 4 TO 6 HOURS AS NEEDED FOR NAUSEA  . ranitidine (ZANTAC) 150 MG tablet RANITIDINE HCL, 150MG  (Oral Tablet)  1 Two Times A Day for heartburn, indigestion, nausea for 0 days  Quantity: 60.00;  Refills:  5   Ordered :22-Jul-2010  Reginia Forts MD;  Buddy Duty 28-Jan-2010 Active Comments: DX: 787.02  . simvastatin (ZOCOR) 40 MG tablet Take 1 tablet (40 mg total) by mouth daily.  . traMADol (ULTRAM) 50 MG tablet TAKE 1 TABLET BY MOUTH EVERY 8 HOURS AS NEEDED  . Vitamin D, Ergocalciferol, (DRISDOL) 50000 UNITS CAPS capsule Take 50,000 Units by mouth once a week.      Review of Systems  Constitutional: Positive for appetite change.  HENT: Positive for congestion, rhinorrhea and sore throat.   Respiratory: Positive for cough, shortness of breath and wheezing.   Musculoskeletal: Positive for arthralgias, myalgias and neck pain.    Social History  Substance Use Topics  . Smoking status: Current Every Day Smoker    Packs/day: 0.25  . Smokeless tobacco: Never Used     Comment: Current Every day smoker.Has be    n  nnn             . Alcohol use 0.0 oz/week     Comment: rarely   Objective:   BP (!) 150/80 (BP Location: Right Arm, Patient Position: Sitting, Cuff Size: Large)   Pulse 78   Temp 98.1 F (36.7 C) (Oral)   Ht 5\' 5"  (1.651 m)   Wt 185 lb (83.9 kg)   BMI 30.79 kg/m   Physical Exam   General Appearance:    Alert, cooperative, no distress  Eyes:    PERRL, conjunctiva/corneas clear, EOM's intact       Lungs:     Clear to auscultation bilaterally, respirations unlabored  Heart:    Regular rate and rhythm  Neurologic:   Awake, alert, oriented x 3. No apparent focal neurological           defect.           Assessment & Plan:     1. Bronchitis Greatly improved. Continue azithromycin and inhales. Is going back on hydrocodone/apap in place of hydrocodone cough syrup.   2. Compression injury of cervical spinal nerve  - HYDROcodone-acetaminophen (NORCO) 7.5-325 MG tablet; Take 1 tablet by mouth every 6 (six) hours as needed for moderate pain.  Dispense: 30 tablet; Refill: 0  3. Clinical depression Doing well on paroxetine which is refilled today.      The entirety of the information documented in the History of Present Illness, Review of Systems and Physical Exam were personally obtained by me. Portions of this information were initially documented by Lynford Humphrey, CMA and reviewed by me for thoroughness and accuracy.    Lelon Huh, MD  [facility]

## 2016-06-10 MED ORDER — PAROXETINE HCL 40 MG PO TABS: 40.0000 mg | ORAL_TABLET | Freq: Every day | ORAL | 5 refills | Status: DC

## 2016-06-12 ENCOUNTER — Other Ambulatory Visit: Payer: Self-pay | Admitting: Family Medicine

## 2016-06-16 ENCOUNTER — Other Ambulatory Visit: Payer: Self-pay | Admitting: Family Medicine

## 2016-06-16 DIAGNOSIS — J40 Bronchitis, not specified as acute or chronic: Secondary | ICD-10-CM

## 2016-06-16 NOTE — Telephone Encounter (Signed)
Was seen 06/09/2016, which was also last refill. Please advise. Renaldo Fiddler, CMA

## 2016-06-16 NOTE — Telephone Encounter (Signed)
Pt contacted office for refill request on the following medications: HYDROcodone-acetaminophen ([place]) 7.5-325 MG tablet Last written: 06/09/16 Last OV: 06/09/16 Please advise. Thanks TNP

## 2016-06-17 MED ORDER — HYDROCODONE-ACETAMINOPHEN 7.5-325 MG PO TABS: 1.0000 | ORAL_TABLET | Freq: Four times a day (QID) | ORAL | 0 refills | Status: DC | PRN

## 2016-06-30 ENCOUNTER — Other Ambulatory Visit: Payer: Self-pay | Admitting: Family Medicine

## 2016-06-30 DIAGNOSIS — J40 Bronchitis, not specified as acute or chronic: Secondary | ICD-10-CM

## 2016-06-30 MED ORDER — HYDROCODONE-ACETAMINOPHEN 7.5-325 MG PO TABS: 1.0000 | ORAL_TABLET | Freq: Four times a day (QID) | ORAL | 0 refills | Status: DC | PRN

## 2016-06-30 NOTE — Telephone Encounter (Signed)
Pt needs refill on her hydrocodone.  Please call when ready to pick up  [facility]

## 2016-07-14 ENCOUNTER — Other Ambulatory Visit: Payer: Self-pay | Admitting: Family Medicine

## 2016-07-14 DIAGNOSIS — J40 Bronchitis, not specified as acute or chronic: Secondary | ICD-10-CM

## 2016-07-14 MED ORDER — HYDROCODONE-ACETAMINOPHEN 7.5-325 MG PO TABS: 1.0000 | ORAL_TABLET | Freq: Four times a day (QID) | ORAL | 0 refills | Status: DC | PRN

## 2016-07-14 NOTE — Telephone Encounter (Signed)
Pt contacted office for refill request on the following medications: ° °HYDROcodone-acetaminophen (NORCO) 7.5-325 MG tablet ° °CB#[phone]/MW °

## 2016-07-15 ENCOUNTER — Other Ambulatory Visit: Payer: Self-pay | Admitting: Family Medicine

## 2016-07-15 DIAGNOSIS — I10 Essential (primary) hypertension: Secondary | ICD-10-CM

## 2016-07-23 ENCOUNTER — Telehealth: Payer: Self-pay

## 2016-07-23 DIAGNOSIS — J40 Bronchitis, not specified as acute or chronic: Secondary | ICD-10-CM

## 2016-07-23 DIAGNOSIS — S149XXA Injury of unspecified nerves of neck, initial encounter: Secondary | ICD-10-CM

## 2016-07-23 MED ORDER — HYDROCODONE-ACETAMINOPHEN 7.5-325 MG PO TABS: 1.0000 | ORAL_TABLET | Freq: Four times a day (QID) | ORAL | 0 refills | Status: DC | PRN

## 2016-07-23 NOTE — Telephone Encounter (Signed)
Patient called requesting refills on hydrocodone.

## 2016-07-23 NOTE — Telephone Encounter (Signed)
Please notify patient Rx printed and will be up front for pick up 

## 2016-07-26 NOTE — Telephone Encounter (Signed)
Pt called to see if Rx was ready. Pt was advised. Thanks TNP

## 2016-08-05 MED ORDER — HYDROCODONE-ACETAMINOPHEN 7.5-325 MG PO TABS: 1.0000 | ORAL_TABLET | Freq: Four times a day (QID) | ORAL | 0 refills | Status: DC | PRN

## 2016-08-05 NOTE — Telephone Encounter (Signed)
Please review. Thanks!  

## 2016-08-05 NOTE — Telephone Encounter (Signed)
Patient is requesting a refill on Hydrocodone. Last refill was 07/23/16

## 2016-08-18 ENCOUNTER — Other Ambulatory Visit: Payer: Self-pay | Admitting: Family Medicine

## 2016-08-18 DIAGNOSIS — S149XXA Injury of unspecified nerves of neck, initial encounter: Secondary | ICD-10-CM

## 2016-08-18 NOTE — Telephone Encounter (Signed)
Pt needs refill on her hydrocodone.  Please call pt when ready to pick up [phone]  Thanks teri

## 2016-08-19 MED ORDER — HYDROCODONE-ACETAMINOPHEN 7.5-325 MG PO TABS: 1.0000 | ORAL_TABLET | Freq: Four times a day (QID) | ORAL | 0 refills | Status: DC | PRN

## 2016-08-26 ENCOUNTER — Ambulatory Visit (INDEPENDENT_AMBULATORY_CARE_PROVIDER_SITE_OTHER): Payer: Medicare Other

## 2016-08-26 DIAGNOSIS — Z23 Encounter for immunization: Secondary | ICD-10-CM | POA: Diagnosis not present

## 2016-08-30 ENCOUNTER — Other Ambulatory Visit: Payer: Self-pay | Admitting: Family Medicine

## 2016-08-30 DIAGNOSIS — S149XXA Injury of unspecified nerves of neck, initial encounter: Secondary | ICD-10-CM

## 2016-08-30 NOTE — Telephone Encounter (Signed)
Pt contacted office for refill request on the following medications: ° °HYDROcodone-acetaminophen (NORCO) 7.5-325 MG tablet ° °CB#[phone]/MW °

## 2016-08-30 NOTE — Telephone Encounter (Signed)
Please review. Thanks!  

## 2016-08-31 MED ORDER — HYDROCODONE-ACETAMINOPHEN 7.5-325 MG PO TABS: 1.0000 | ORAL_TABLET | Freq: Four times a day (QID) | ORAL | 0 refills | Status: DC | PRN

## 2016-09-08 ENCOUNTER — Telehealth: Payer: Self-pay | Admitting: Family Medicine

## 2016-09-08 DIAGNOSIS — S149XXA Injury of unspecified nerves of neck, initial encounter: Secondary | ICD-10-CM

## 2016-09-08 MED ORDER — HYDROCODONE-ACETAMINOPHEN 7.5-325 MG PO TABS: 1.0000 | ORAL_TABLET | Freq: Four times a day (QID) | ORAL | 0 refills | Status: DC | PRN

## 2016-09-08 NOTE — Telephone Encounter (Signed)
Pt contacted office for refill request on the following medications: HYDROcodone-acetaminophen ([place]) 7.5-325 MG tablet Last written: 08/31/16 Pt stated she is out of the medication. Please advise. Thanks TNP

## 2016-09-08 NOTE — Telephone Encounter (Signed)
Called Pt to schedule AWV with [facility] - knb °

## 2016-09-08 NOTE — Telephone Encounter (Signed)
Please review

## 2016-09-08 NOTE — Telephone Encounter (Signed)
Pt is scheduled for 09/30/16 @ 3 pm for AWV. Thanks TNP

## 2016-09-23 ENCOUNTER — Other Ambulatory Visit: Payer: Self-pay | Admitting: Family Medicine

## 2016-09-23 DIAGNOSIS — S149XXA Injury of unspecified nerves of neck, initial encounter: Secondary | ICD-10-CM

## 2016-09-23 NOTE — Telephone Encounter (Signed)
Pt needs refill    HYDROcodone-acetaminophen (NORCO) 7.5-325 MG tablet   09/08/16 -- [name], MD   Take 1 tablet by mouth every 6 (six) hours as needed for moderate      Thanks Con Memos

## 2016-09-24 MED ORDER — HYDROCODONE-ACETAMINOPHEN 7.5-325 MG PO TABS: 1.0000 | ORAL_TABLET | Freq: Four times a day (QID) | ORAL | 0 refills | Status: DC | PRN

## 2016-09-30 ENCOUNTER — Encounter: Payer: Self-pay | Admitting: Physician Assistant

## 2016-09-30 ENCOUNTER — Ambulatory Visit (INDEPENDENT_AMBULATORY_CARE_PROVIDER_SITE_OTHER): Payer: 59 | Admitting: Physician Assistant

## 2016-09-30 ENCOUNTER — Ambulatory Visit (INDEPENDENT_AMBULATORY_CARE_PROVIDER_SITE_OTHER): Payer: 59

## 2016-09-30 VITALS — BP 144/83 | HR 72 | Temp 98.7°F | Ht 65.0 in | Wt 183.4 lb

## 2016-09-30 VITALS — BP 144/83 | HR 72 | Temp 98.7°F | Ht 65.0 in | Wt 183.6 lb

## 2016-09-30 DIAGNOSIS — M545 Low back pain, unspecified: Secondary | ICD-10-CM

## 2016-09-30 DIAGNOSIS — Z114 Encounter for screening for human immunodeficiency virus [HIV]: Secondary | ICD-10-CM | POA: Diagnosis not present

## 2016-09-30 DIAGNOSIS — Z1159 Encounter for screening for other viral diseases: Secondary | ICD-10-CM | POA: Diagnosis not present

## 2016-09-30 DIAGNOSIS — Z0000 Encounter for general adult medical examination without abnormal findings: Secondary | ICD-10-CM

## 2016-09-30 MED ORDER — CYCLOBENZAPRINE HCL 10 MG PO TABS: 10.0000 mg | ORAL_TABLET | Freq: Every day | ORAL | 0 refills | Status: AC

## 2016-09-30 NOTE — Progress Notes (Signed)
Subjective:   Jamie Williams is a 55 y.o. female who presents for an Initial Medicare Annual Wellness Visit.  Review of Systems    N/A  Cardiac Risk Factors include: advanced age (>28men, >36 women);dyslipidemia;hypertension;obesity (BMI >30kg/m2)     Objective:    Today's Vitals   09/30/16 1457 09/30/16 1504  BP: (!) 144/83   Pulse: 72   Temp: 98.7 F (37.1 C)   TempSrc: Oral   Weight: 183 lb 6 oz (83.2 kg)   Height: 5\' 5"  (1.651 m)   PainSc: 10-Worst pain ever 10-Worst pain ever  PainLoc: Back    Body mass index is 30.52 kg/m.   Current Medications (verified) Outpatient Encounter Prescriptions as of 09/30/2016  Medication Sig  . amLODipine (NORVASC) 10 MG tablet TAKE 1 TABLET BY MOUTH ONCE A DAY  . buPROPion (WELLBUTRIN SR) 150 MG 12 hr tablet TAKE 1 TABLET BY MOUTH TWICE A DAY  . clonazePAM (KLONOPIN) 0.5 MG tablet Take 1 tablet by mouth 2 (two) times daily as needed for anxiety.   . gabapentin (NEURONTIN) 300 MG capsule Take 1 capsule (300 mg total) by mouth 3 (three) times daily.  Marland Kitchen HYDROcodone-acetaminophen (NORCO) 7.5-325 MG tablet Take 1 tablet by mouth every 6 (six) hours as needed for moderate pain.  . meloxicam (MOBIC) 15 MG tablet TAKE 1 TABLET BY MOUTH DAILY WITH FOOD  . PARoxetine (PAXIL) 40 MG tablet Take 1 tablet (40 mg total) by mouth daily.  . promethazine (PHENERGAN) 25 MG tablet TAKE 1 TABLET EVERY 4 TO 6 HOURS AS NEEDED FOR NAUSEA  . ranitidine (ZANTAC) 150 MG tablet RANITIDINE HCL, 150MG  (Oral Tablet)  1 Two Times A Day for heartburn, indigestion, nausea for 0 days  Quantity: 60.00;  Refills: 5   Ordered :22-Jul-2010  Reginia Forts MD;  Buddy Duty 28-Jan-2010 Active Comments: DX: 787.02  . simvastatin (ZOCOR) 40 MG tablet Take 1 tablet (40 mg total) by mouth daily.  . traMADol (ULTRAM) 50 MG tablet TAKE 1 TABLET BY MOUTH EVERY 8 HOURS AS NEEDED  . Vitamin D, Ergocalciferol, (DRISDOL) 50000 UNITS CAPS capsule Take 50,000 Units by mouth once a week.    Marland Kitchen albuterol (PROVENTIL HFA;VENTOLIN HFA) 108 (90 Base) MCG/ACT inhaler Inhale 2 puffs into the lungs every 6 (six) hours as needed for wheezing or shortness of breath. (Patient not taking: Reported on 09/30/2016)  . cyclobenzaprine (FLEXERIL) 10 MG tablet Take 1 tablet (10 mg total) by mouth 3 (three) times daily as needed for muscle spasms. (Patient not taking: Reported on 09/30/2016)  . predniSONE (DELTASONE) 20 MG tablet Take 3 tablets (60 mg total) by mouth daily. (Patient not taking: Reported on 09/30/2016)   No facility-administered encounter medications on file as of 09/30/2016.     Allergies (verified) Aspirin; Sulfa antibiotics; and Penicillins   History: Past Medical History:  Diagnosis Date  . Anxiety   . Arthritis    hands  . Depression   . Family history of adverse reaction to anesthesia    "sister had a difficult time waking up from mastectomy and passed away"   . GERD (gastroesophageal reflux disease)   . Headache    migraines  . History of chicken pox   . History of kidney stones   . Hypercholesteremia   . Hypertension   . Numbness and tingling in left arm   . PONV (postoperative nausea and vomiting)    nausea  . Urinary frequency    Past Surgical History:  Procedure Laterality Date  .  ABDOMINAL HYSTERECTOMY  2011   Abdominal; Ovaries intact; CERVIX INTACT. Fibroids/dys menorrhea. [place]  . ANTERIOR CERVICAL DECOMP/DISCECTOMY FUSION N/A 12/24/2015   Procedure: Cervical five-six, Cervical six-seven  Anterior cervical decompression/diskectomy/fusion/interbody prosthesis/plate;  Surgeon: Newman Pies, MD;  Location: [facility];  Service: Neurosurgery;  Laterality: N/A;  . [place] and 1996   two Etopic preg.  Marland Kitchen Head Injuries  2006   surgery 2006 Cram/NS in [place]. Headaches with increased intracranial pressure. Repeat MRI in 2008 Negative  . Ulnar Neuropathy Right 2004   Ulnar Neuropathy surgical revision. 2004-2008. [place].     Family History  Problem Relation Age of Onset  . Hypertension Mother   . Diabetes Mother     Type 2  . Breast cancer Mother 7  . Cancer Sister 23    Breast  . Diabetes Sister     type 2  . Breast cancer Sister 33  . Diabetes Sister   . Diabetes Sister   . Diabetes Sister     type 2   Social History   Occupational History  . Not on file.   Social History Main Topics  . Smoking status: Current Every Day Smoker    Packs/day: 0.25  . Smokeless tobacco: Never Used     Comment:            . Alcohol use 0.0 oz/week     Comment: rarely  . Drug use: No  . Sexual activity: Not on file    Tobacco Counseling Ready to quit: Not Answered Counseling given: Not Answered   Activities of Daily Living In your present state of health, do you have any difficulty performing the following activities: 09/30/2016 12/16/2015  Hearing? N N  Vision? N N  Difficulty concentrating or making decisions? N N  Walking or climbing stairs? Y N  Dressing or bathing? N N  Doing errands, shopping? N N  Preparing Food and eating ? N -  Using the Toilet? N -  In the past six months, have you accidently leaked urine? N -  Do you have problems with loss of bowel control? N -  Managing your Medications? N -  Managing your Finances? N -  Housekeeping or managing your Housekeeping? N -  Some recent data might be hidden    Immunizations and Health Maintenance Immunization History  Administered Date(s) Administered  . Influenza,inj,Quad PF,36+ Mos 08/29/2015, 08/26/2016  . Pneumococcal Polysaccharide-23 02/24/2011  . Tdap 01/28/2010   There are no preventive care reminders to display for this patient.  Patient Care Team: [name], MD as PCP - General (Family Medicine) Newman Pies, MD as Consulting Physician (Neurosurgery)  Indicate any recent Medical Services you may have received from other than [facility] providers in the past year (date may be approximate).     Assessment:   This  is a routine wellness examination for Jamie Williams.   Hearing/Vision screen Vision Screening Comments: Pt does not see an eye doctor on regular basis.  Dietary issues and exercise activities discussed: Current Exercise Habits: The patient does not participate in regular exercise at present, Exercise limited by: orthopedic condition(s)  Goals    . Increase water intake          Starting 09/30/16, I will continue to drink 4 glasses of water a day.      Depression Screen PHQ 2/9 Scores 09/30/2016  PHQ - 2 Score 0    Fall Risk Fall Risk  09/30/2016  Falls in the  past year? No    Cognitive Function:     6CIT Screen 09/30/2016  What Year? 0 points  What month? 0 points  What time? 0 points  Count back from 20 0 points  Months in reverse 2 points  Repeat phrase 4 points  Total Score 6    Screening Tests Health Maintenance  Topic Date Due  . COLONOSCOPY  09/30/2016 (Originally 11/11/2012)  . PAP SMEAR  10/25/2016  . MAMMOGRAM  09/09/2017  . TETANUS/TDAP  01/29/2020  . INFLUENZA VACCINE  Completed  . Hepatitis C Screening  Completed  . HIV Screening  Completed      Plan:  I have personally reviewed and addressed the Medicare Annual Wellness questionnaire and have noted the following in the patient's chart:  A. Medical and social history B. Use of alcohol, tobacco or illicit drugs  C. Current medications and supplements D. Functional ability and status E.  Nutritional status F.  Physical activity G. Advance directives H. List of other physicians I.  Hospitalizations, surgeries, and ER visits in previous 12 months J.  [place] such as hearing and vision if needed, cognitive and depression L. Referrals and appointments - none  In addition, I have reviewed and discussed with patient certain preventive protocols, quality metrics, and best practice recommendations. A written personalized care plan for preventive services as well as general preventive health  recommendations were provided to patient.  See attached scanned questionnaire for additional information.   Signed,  Fabio Neighbors, LPN Nurse Health Advisor  MD Recommendations: Follow up on colonoscopy. Patient denied order today.

## 2016-09-30 NOTE — Progress Notes (Addendum)
Patient: Jamie Williams Female    DOB: 1961/10/02   55 y.o.   MRN: [id number] Visit Date: 09/30/2016  Today's Provider: Trinna Post, PA-C   Chief Complaint  Patient presents with  . Back Pain   Subjective:    Back Pain  This is a new problem. The current episode started in the past 7 days. The problem occurs constantly. The problem is unchanged. The pain is present in the lumbar spine (Mostly on the left side. ). Radiates to: Radiates across her back. The symptoms are aggravated by bending, sitting and standing. Pertinent negatives include no abdominal pain, bladder incontinence, bowel incontinence, dysuria, fever, leg pain, numbness, paresis, paresthesias, pelvic pain, perianal numbness, tingling or weakness. She has tried NSAIDs for the symptoms. The treatment provided mild relief.   Patient is a 56 y/o female with a history of ACDF in March for bulging disc compressing on spinal cord on chronic opioid therapy with Norco 7.5/325 refilled on 09/24/2016, Mobic 15 mg daily presenting today with low back pain ongoing since Saturday. She reports she was doing nothing in particular, no heavy lifting when it started. She reports the pain is 10/10 and travels across her lower back. Does not radiate down legs. No active cancer, no IVDU, no recent surgery on the area. She says she is only taking Advil for pain. When asked about her Norco, she says she does take that, but for her neck. She also reports she is taking Mobic 15 mg daily. Not taking tramadol. Smokes 4 cigarettes per day.    Allergies  Allergen Reactions  . Aspirin     nausea with vomiting.  . Sulfa Antibiotics     hives.  . Penicillins Rash     Current Outpatient Prescriptions:  .  amLODipine (NORVASC) 10 MG tablet, TAKE 1 TABLET BY MOUTH ONCE A DAY, Disp: 30 tablet, Rfl: 3 .  buPROPion (WELLBUTRIN SR) 150 MG 12 hr tablet, TAKE 1 TABLET BY MOUTH TWICE A DAY, Disp: 60 tablet, Rfl: 5 .  clonazePAM (KLONOPIN) 0.5 MG  tablet, Take 1 tablet by mouth 2 (two) times daily as needed for anxiety. , Disp: , Rfl:  .  cyclobenzaprine (FLEXERIL) 10 MG tablet, Take 1 tablet (10 mg total) by mouth 3 (three) times daily as needed for muscle spasms., Disp: 50 tablet, Rfl: 0 .  gabapentin (NEURONTIN) 300 MG capsule, Take 1 capsule (300 mg total) by mouth 3 (three) times daily., Disp: 90 capsule, Rfl: 3 .  HYDROcodone-acetaminophen (NORCO) 7.5-325 MG tablet, Take 1 tablet by mouth every 6 (six) hours as needed for moderate pain., Disp: 30 tablet, Rfl: 0 .  meloxicam (MOBIC) 15 MG tablet, TAKE 1 TABLET BY MOUTH DAILY WITH FOOD, Disp: 30 tablet, Rfl: 6 .  PARoxetine (PAXIL) 40 MG tablet, Take 1 tablet (40 mg total) by mouth daily., Disp: 30 tablet, Rfl: 5 .  promethazine (PHENERGAN) 25 MG tablet, TAKE 1 TABLET EVERY 4 TO 6 HOURS AS NEEDED FOR NAUSEA, Disp: 30 tablet, Rfl: 3 .  ranitidine (ZANTAC) 150 MG tablet, RANITIDINE HCL, 150MG  (Oral Tablet)  1 Two Times A Day for heartburn, indigestion, nausea for 0 days  Quantity: 60.00;  Refills: 5   Ordered :22-Jul-2010  Reginia Forts MD;  Buddy Duty 28-Jan-2010 Active Comments: DX: 787.02, Disp: , Rfl:  .  simvastatin (ZOCOR) 40 MG tablet, Take 1 tablet (40 mg total) by mouth daily., Disp: 30 tablet, Rfl: 6 .  traMADol (ULTRAM) 50 MG tablet,  TAKE 1 TABLET BY MOUTH EVERY 8 HOURS AS NEEDED, Disp: 30 tablet, Rfl: 2 .  Vitamin D, Ergocalciferol, (DRISDOL) 50000 UNITS CAPS capsule, Take 50,000 Units by mouth once a week. , Disp: , Rfl:   Review of Systems  Constitutional: Positive for diaphoresis. Negative for activity change, appetite change, chills, fatigue, fever and unexpected weight change.  Gastrointestinal: Positive for nausea. Negative for abdominal distention, abdominal pain, anal bleeding, blood in stool, bowel incontinence, constipation, diarrhea, rectal pain and vomiting.  Genitourinary: Positive for frequency. Negative for bladder incontinence, decreased urine volume, difficulty  urinating, dysuria, hematuria, pelvic pain, urgency, vaginal bleeding, vaginal discharge and vaginal pain.  Musculoskeletal: Positive for back pain. Negative for arthralgias, gait problem, joint swelling, myalgias, neck pain and neck stiffness.  Neurological: Negative for tingling, weakness, numbness and paresthesias.    Social History  Substance Use Topics  . Smoking status: Current Every Day Smoker    Packs/day: 0.25  . Smokeless tobacco: Never Used     Comment:            . Alcohol use 0.0 oz/week     Comment: rarely   Objective:   BP (!) 144/83   Pulse 72   Temp 98.7 F (37.1 C) (Oral)   Ht 5\' 5"  (1.651 m)   Wt 183 lb 9.6 oz (83.3 kg)   BMI 30.55 kg/m   Physical Exam  Constitutional: She is oriented to person, place, and time. She appears well-developed and well-nourished. No distress.  Abdominal: Soft. Bowel sounds are normal.  Musculoskeletal: Normal range of motion. She exhibits tenderness. She exhibits no edema or deformity.       Lumbar back: She exhibits tenderness.  Left sided lumbosacral muscular tenderness. No spinous process tenderness. Normal spinal ROM. Normal BLE exam, 5/5 strength, 2/2 patellar reflex and in tact distal pulses. Sensation normal.   Neurological: She is alert and oriented to person, place, and time.  Skin: Skin is warm and dry. She is not diaphoretic.  Psychiatric: She has a normal mood and affect. Her behavior is normal.        Assessment & Plan:      Problem List Items Addressed This Visit      Other   Back ache - Primary   Relevant Medications   cyclobenzaprine (FLEXERIL) 10 MG tablet     Patient is a 55 y/o female presenting with acute low back pain. In office urinalysis negative. Counseled patient on generally benign and transient nature of low back pain. Patient does not have alarm features. Patient is undistressed in waiting room. Patient takes Norco 7.5/325 twice a day and consistently gets monthly refills. Also taking 15 mg  Mobic. Not taking tramadol. Will give flexeril to be taken at night. May get image if pain persists >6 weeks.   Return if symptoms worsen or fail to improve.  The entirety of the information documented in the History of Present Illness, Review of Systems and Physical Exam were personally obtained by me. Portions of this information were initially documented by Ashley Royalty, CMA and reviewed by me for thoroughness and accuracy.   Patient Instructions  Back Pain, Adult Introduction Back pain is very common. The pain often gets better over time. The cause of back pain is usually not dangerous. Most people can learn to manage their back pain on their own. Follow these instructions at home: Watch your back pain for any changes. The following actions may help to lessen any pain you are feeling:  Stay  active. Start with short walks on flat ground if you can. Try to walk farther each day.  Exercise regularly as told by your doctor. Exercise helps your back heal faster. It also helps avoid future injury by keeping your muscles strong and flexible.  Do not sit, drive, or stand in one place for more than 30 minutes.  Do not stay in bed. Resting more than 1-2 days can slow down your recovery.  Be careful when you bend or lift an object. Use good form when lifting:  Bend at your knees.  Keep the object close to your body.  Do not twist.  Sleep on a firm mattress. Lie on your side, and bend your knees. If you lie on your back, put a pillow under your knees.  Take medicines only as told by your doctor.  Put ice on the injured area.  Put ice in a plastic bag.  Place a towel between your skin and the bag.  Leave the ice on for 20 minutes, 2-3 times a day for the first 2-3 days. After that, you can switch between ice and heat packs.  Avoid feeling anxious or stressed. Find good ways to deal with stress, such as exercise.  Maintain a healthy weight. Extra weight puts stress on your  back. Contact a doctor if:  You have pain that does not go away with rest or medicine.  You have worsening pain that goes down into your legs or buttocks.  You have pain that does not get better in one week.  You have pain at night.  You lose weight.  You have a fever or chills. Get help right away if:  You cannot control when you poop (bowel movement) or pee (urinate).  Your arms or legs feel weak.  Your arms or legs lose feeling (numbness).  You feel sick to your stomach (nauseous) or throw up (vomit).  You have belly (abdominal) pain.  You feel like you may pass out (faint). This information is not intended to replace advice given to you by your health care provider. Make sure you discuss any questions you have with your health care provider. Document Released: 03/29/2008 Document Revised: 03/18/2016 Document Reviewed: 02/12/2014  2017 [place], PA-C  [facility]

## 2016-09-30 NOTE — Patient Instructions (Signed)
Jamie Williams , Thank you for taking time to come for your Medicare Wellness Visit. I appreciate your ongoing commitment to your health goals. Please review the following plan we discussed and let me know if I can assist you in the future.   These are the goals we discussed: Goals    . Increase water intake          Starting 09/30/16, I will continue to drink 4 glasses of water a day.       This is a list of the screening recommended for you and due dates:  Health Maintenance  Topic Date Due  .  Hepatitis C: One time screening is recommended by Center for Disease Control  (CDC) for  adults born from [birth year] through 1963.   January 20, 1961  . HIV Screening  11/11/1977  . Colon Cancer Screening  11/11/2012  . Pap Smear  07/12/2016  . Mammogram  09/09/2017  . Tetanus Vaccine  01/29/2020  . Flu Shot  Completed   Preventive Care for Adults  A healthy lifestyle and preventive care can promote health and wellness. Preventive health guidelines for adults include the following key practices.  . A routine yearly physical is a good way to check with your health care provider about your health and preventive screening. It is a chance to share any concerns and updates on your health and to receive a thorough exam.  . Visit your dentist for a routine exam and preventive care every 6 months. Brush your teeth twice a day and floss once a day. Good oral hygiene prevents tooth decay and gum disease.  . The frequency of eye exams is based on your age, health, family medical history, use  of contact lenses, and other factors. Follow your health care provider's ecommendations for frequency of eye exams.  . Eat a healthy diet. Foods like vegetables, fruits, whole grains, low-fat dairy products, and lean protein foods contain the nutrients you need without too many calories. Decrease your intake of foods high in solid fats, added sugars, and salt. Eat the right amount of calories for you. Get information about a  proper diet from your health care provider, if necessary.  . Regular physical exercise is one of the most important things you can do for your health. Most adults should get at least 150 minutes of moderate-intensity exercise (any activity that increases your heart rate and causes you to sweat) each week. In addition, most adults need muscle-strengthening exercises on 2 or more days a week.  Silver Sneakers may be a benefit available to you. To determine eligibility, you may visit the website: www.silversneakers.com or contact program at 626 583 2010 Mon-Fri between 8AM-8PM.   . Maintain a healthy weight. The body mass index (BMI) is a screening tool to identify possible weight problems. It provides an estimate of body fat based on height and weight. Your health care provider can find your BMI and can help you achieve or maintain a healthy weight.   For adults 20 years and older: ? A BMI below 18.5 is considered underweight. ? A BMI of 18.5 to 24.9 is normal. ? A BMI of 25 to 29.9 is considered overweight. ? A BMI of 30 and above is considered obese.   . Maintain normal blood lipids and cholesterol levels by exercising and minimizing your intake of saturated fat. Eat a balanced diet with plenty of fruit and vegetables. Blood tests for lipids and cholesterol should begin at age 61 and be repeated every 5 years.  If your lipid or cholesterol levels are high, you are over 50, or you are at high risk for heart disease, you may need your cholesterol levels checked more frequently. Ongoing high lipid and cholesterol levels should be treated with medicines if diet and exercise are not working.  . If you smoke, find out from your health care provider how to quit. If you do not use tobacco, please do not start.  . If you choose to drink alcohol, please do not consume more than 2 drinks per day. One drink is considered to be 12 ounces (355 mL) of beer, 5 ounces (148 mL) of wine, or 1.5 ounces (44 mL) of  liquor.  . If you are 56-96 years old, ask your health care provider if you should take aspirin to prevent strokes.  . Use sunscreen. Apply sunscreen liberally and repeatedly throughout the day. You should seek shade when your shadow is shorter than you. Protect yourself by wearing long sleeves, pants, a wide-brimmed hat, and sunglasses year round, whenever you are outdoors.  . Once a month, do a whole body skin exam, using a mirror to look at the skin on your back. Tell your health care provider of new moles, moles that have irregular borders, moles that are larger than a pencil eraser, or moles that have changed in shape or color.

## 2016-09-30 NOTE — Patient Instructions (Signed)
Back Pain, Adult Introduction Back pain is very common. The pain often gets better over time. The cause of back pain is usually not dangerous. Most people can learn to manage their back pain on their own. Follow these instructions at home: Watch your back pain for any changes. The following actions may help to lessen any pain you are feeling:  Stay active. Start with short walks on flat ground if you can. Try to walk farther each day.  Exercise regularly as told by your doctor. Exercise helps your back heal faster. It also helps avoid future injury by keeping your muscles strong and flexible.  Do not sit, drive, or stand in one place for more than 30 minutes.  Do not stay in bed. Resting more than 1-2 days can slow down your recovery.  Be careful when you bend or lift an object. Use good form when lifting:  Bend at your knees.  Keep the object close to your body.  Do not twist.  Sleep on a firm mattress. Lie on your side, and bend your knees. If you lie on your back, put a pillow under your knees.  Take medicines only as told by your doctor.  Put ice on the injured area.  Put ice in a plastic bag.  Place a towel between your skin and the bag.  Leave the ice on for 20 minutes, 2-3 times a day for the first 2-3 days. After that, you can switch between ice and heat packs.  Avoid feeling anxious or stressed. Find good ways to deal with stress, such as exercise.  Maintain a healthy weight. Extra weight puts stress on your back. Contact a doctor if:  You have pain that does not go away with rest or medicine.  You have worsening pain that goes down into your legs or buttocks.  You have pain that does not get better in one week.  You have pain at night.  You lose weight.  You have a fever or chills. Get help right away if:  You cannot control when you poop (bowel movement) or pee (urinate).  Your arms or legs feel weak.  Your arms or legs lose feeling  (numbness).  You feel sick to your stomach (nauseous) or throw up (vomit).  You have belly (abdominal) pain.  You feel like you may pass out (faint). This information is not intended to replace advice given to you by your health care provider. Make sure you discuss any questions you have with your health care provider. Document Released: 03/29/2008 Document Revised: 03/18/2016 Document Reviewed: 02/12/2014  2017 [employer]  

## 2016-10-01 LAB — HEPATITIS C ANTIBODY

## 2016-10-01 LAB — HIV ANTIBODY (ROUTINE TESTING W REFLEX): HIV SCREEN 4TH GENERATION: NONREACTIVE

## 2016-10-04 ENCOUNTER — Other Ambulatory Visit: Payer: Self-pay | Admitting: Family Medicine

## 2016-10-04 DIAGNOSIS — S149XXA Injury of unspecified nerves of neck, initial encounter: Secondary | ICD-10-CM

## 2016-10-04 NOTE — Telephone Encounter (Signed)
Please review. Thanks!  

## 2016-10-04 NOTE — Telephone Encounter (Signed)
Pt contacted office for refill request on the following medications: ° °HYDROcodone-acetaminophen (NORCO) 7.5-325 MG tablet ° °CB#[phone]/MW °

## 2016-10-05 MED ORDER — HYDROCODONE-ACETAMINOPHEN 7.5-325 MG PO TABS: 1.0000 | ORAL_TABLET | Freq: Four times a day (QID) | ORAL | 0 refills | Status: DC | PRN

## 2016-10-13 ENCOUNTER — Other Ambulatory Visit: Payer: Self-pay | Admitting: Family Medicine

## 2016-10-15 ENCOUNTER — Other Ambulatory Visit: Payer: Self-pay | Admitting: Family Medicine

## 2016-10-15 DIAGNOSIS — S149XXA Injury of unspecified nerves of neck, initial encounter: Secondary | ICD-10-CM

## 2016-10-15 MED ORDER — HYDROCODONE-ACETAMINOPHEN 7.5-325 MG PO TABS: 1.0000 | ORAL_TABLET | Freq: Four times a day (QID) | ORAL | 0 refills | Status: DC | PRN

## 2016-10-15 NOTE — Telephone Encounter (Signed)
Pt contacted office for refill request on the following medications: HYDROcodone-acetaminophen ([place]) 7.5-325 MG tablet  Last written: 10/05/16 Pt stated she is out of medication. Please advise. Thanks TNP

## 2016-10-29 ENCOUNTER — Other Ambulatory Visit: Payer: Self-pay | Admitting: Family Medicine

## 2016-10-29 DIAGNOSIS — S149XXA Injury of unspecified nerves of neck, initial encounter: Secondary | ICD-10-CM

## 2016-10-29 NOTE — Telephone Encounter (Signed)
Pt contacted office for refill request on the following medications: HYDROcodone-acetaminophen ([place]) 7.5-325 MG tablet Last Rx:10/15/16 Please advise. Thanks TNP

## 2016-10-30 MED ORDER — HYDROCODONE-ACETAMINOPHEN 7.5-325 MG PO TABS: 1.0000 | ORAL_TABLET | Freq: Four times a day (QID) | ORAL | 0 refills | Status: DC | PRN

## 2016-11-01 NOTE — Telephone Encounter (Signed)
Tried calling pt; was not able to get in touch or leave message advising pt that medication is available for pick up. Renaldo Fiddler, CMA

## 2016-11-09 ENCOUNTER — Other Ambulatory Visit: Payer: Self-pay | Admitting: Family Medicine

## 2016-11-17 ENCOUNTER — Other Ambulatory Visit: Payer: Self-pay

## 2016-11-17 DIAGNOSIS — S149XXA Injury of unspecified nerves of neck, initial encounter: Secondary | ICD-10-CM

## 2016-11-17 MED ORDER — HYDROCODONE-ACETAMINOPHEN 7.5-325 MG PO TABS: 1.0000 | ORAL_TABLET | Freq: Four times a day (QID) | ORAL | 0 refills | Status: DC | PRN

## 2016-11-17 NOTE — Telephone Encounter (Signed)
Patient is requesting a refill on HYDROcodone-acetaminophen (NORCO) 7.5-325 MG tablet. Last RF was 10/30/2016

## 2016-12-08 ENCOUNTER — Telehealth: Payer: Self-pay

## 2016-12-08 DIAGNOSIS — S149XXA Injury of unspecified nerves of neck, initial encounter: Secondary | ICD-10-CM

## 2016-12-08 NOTE — Telephone Encounter (Signed)
Patient is requesting a refill on HYDROcodone-acetaminophen (NORCO) 7.5-325 MG tablet. Last RF 11/17/2016.  [phone]

## 2016-12-09 MED ORDER — HYDROCODONE-ACETAMINOPHEN 7.5-325 MG PO TABS: 1.0000 | ORAL_TABLET | Freq: Four times a day (QID) | ORAL | 0 refills | Status: DC | PRN

## 2016-12-10 DIAGNOSIS — M542 Cervicalgia: Secondary | ICD-10-CM | POA: Diagnosis not present

## 2016-12-10 DIAGNOSIS — Z6829 Body mass index (BMI) 29.0-29.9, adult: Secondary | ICD-10-CM | POA: Diagnosis not present

## 2016-12-10 DIAGNOSIS — I10 Essential (primary) hypertension: Secondary | ICD-10-CM | POA: Diagnosis not present

## 2016-12-13 ENCOUNTER — Other Ambulatory Visit: Payer: Self-pay | Admitting: Family Medicine

## 2016-12-14 NOTE — Telephone Encounter (Signed)
Please call in tramadol.  

## 2016-12-14 NOTE — Telephone Encounter (Signed)
Rx called in to pharmacy. 

## 2016-12-23 ENCOUNTER — Other Ambulatory Visit: Payer: Self-pay

## 2016-12-23 DIAGNOSIS — S149XXA Injury of unspecified nerves of neck, initial encounter: Secondary | ICD-10-CM

## 2016-12-23 MED ORDER — HYDROCODONE-ACETAMINOPHEN 7.5-325 MG PO TABS: 1.0000 | ORAL_TABLET | Freq: Four times a day (QID) | ORAL | 0 refills | Status: DC | PRN

## 2016-12-23 NOTE — Telephone Encounter (Signed)
Patient called requesting refill on Norco. Last fill on 12/09/16 for 30 tablets. Please review.-a

## 2016-12-27 NOTE — Telephone Encounter (Signed)
Please call pt, I was on the phones last week-aa

## 2016-12-30 ENCOUNTER — Other Ambulatory Visit: Payer: Self-pay

## 2016-12-30 DIAGNOSIS — S149XXA Injury of unspecified nerves of neck, initial encounter: Secondary | ICD-10-CM

## 2016-12-30 NOTE — Telephone Encounter (Signed)
LOV 09/30/2016.

## 2016-12-30 NOTE — Telephone Encounter (Signed)
Please advise 

## 2016-12-31 MED ORDER — HYDROCODONE-ACETAMINOPHEN 7.5-325 MG PO TABS: 1.0000 | ORAL_TABLET | Freq: Four times a day (QID) | ORAL | 0 refills | Status: DC | PRN

## 2017-01-06 ENCOUNTER — Telehealth: Payer: Self-pay | Admitting: Family Medicine

## 2017-01-06 DIAGNOSIS — S149XXA Injury of unspecified nerves of neck, initial encounter: Secondary | ICD-10-CM

## 2017-01-06 NOTE — Telephone Encounter (Signed)
Last RF 12/31/2016 Last OV 09/30/2016

## 2017-01-06 NOTE — Telephone Encounter (Signed)
Pt contacted office for refill request on the following medications: ° °HYDROcodone-acetaminophen (NORCO) 7.5-325 MG tablet ° °CB#[phone]/MW °

## 2017-01-07 MED ORDER — HYDROCODONE-ACETAMINOPHEN 7.5-325 MG PO TABS: 1.0000 | ORAL_TABLET | Freq: Four times a day (QID) | ORAL | 0 refills | Status: DC | PRN

## 2017-01-10 ENCOUNTER — Ambulatory Visit (INDEPENDENT_AMBULATORY_CARE_PROVIDER_SITE_OTHER): Payer: 59 | Admitting: Family Medicine

## 2017-01-10 ENCOUNTER — Encounter: Payer: Self-pay | Admitting: Family Medicine

## 2017-01-10 VITALS — BP 130/88 | HR 82 | Temp 98.3°F | Resp 16 | Ht 65.0 in | Wt 182.0 lb

## 2017-01-10 DIAGNOSIS — I10 Essential (primary) hypertension: Secondary | ICD-10-CM | POA: Diagnosis not present

## 2017-01-10 DIAGNOSIS — F329 Major depressive disorder, single episode, unspecified: Secondary | ICD-10-CM | POA: Diagnosis not present

## 2017-01-10 DIAGNOSIS — M4722 Other spondylosis with radiculopathy, cervical region: Secondary | ICD-10-CM

## 2017-01-10 DIAGNOSIS — N951 Menopausal and female climacteric states: Secondary | ICD-10-CM

## 2017-01-10 DIAGNOSIS — E7800 Pure hypercholesterolemia, unspecified: Secondary | ICD-10-CM

## 2017-01-10 DIAGNOSIS — F32A Depression, unspecified: Secondary | ICD-10-CM

## 2017-01-10 DIAGNOSIS — E559 Vitamin D deficiency, unspecified: Secondary | ICD-10-CM | POA: Diagnosis not present

## 2017-01-10 MED ORDER — ESTRADIOL 1 MG PO TABS: 1.0000 mg | ORAL_TABLET | Freq: Every day | ORAL | 3 refills | Status: DC

## 2017-01-10 NOTE — Patient Instructions (Signed)
Try OTC Lidocaine 4% patches for neck pain.

## 2017-01-10 NOTE — Progress Notes (Signed)
Patient: Jamie Williams Female    DOB: 07-29-1961   56 y.o.   MRN: [id number] Visit Date: 01/10/2017  Today's Provider: Lelon Huh, MD   Chief Complaint  Patient presents with  . Follow-up  . Hypertension  . Hyperlipidemia  . Depression   Subjective:    HPI  Vitamin D deficiency From 01/26/2016-labs checked, no changes. Lab Results  Component Value Date   VD25OH 21.2 (L) 01/26/2016     Pre-diabetes From 01/26/2016-labs checked, no changes. Lab Results  Component Value Date   HGBA1C 6.2 (H) 01/26/2016    Compression injury of cervical spinal nerve From 06/09/2016- Continues on scheduled dose of hydrocodone/apap which remains . Continues regular follow up with Dr. Arnoldo Morale. Patient states had Xray by Dr. Arnoldo Morale, and is having more neck pain radiating to left shoulder the last few months. Next follow up with Dr. Arnoldo Morale anticipated in August, but she is thinking about returning sooner since she has been having more pain lately.  Past Surgical History:  Procedure Laterality Date  . ABDOMINAL HYSTERECTOMY  2011   Abdominal; Ovaries intact; CERVIX INTACT. Fibroids/dys menorrhea. [place]  . ANTERIOR CERVICAL DECOMP/DISCECTOMY FUSION N/A 12/24/2015   Procedure: Cervical five-six, Cervical six-seven  Anterior cervical decompression/diskectomy/fusion/interbody prosthesis/plate;  Surgeon: Newman Pies, MD;  Location: [facility];  Service: Neurosurgery;  Laterality: N/A;  . [place] and 1996   two [place] preg.  [place] Kitchen Head Injuries  2006   surgery 2006 Cram/NS in [place]. Headaches with increased intracranial pressure. Repeat MRI in 2008 Negative  . Ulnar Neuropathy Right 2004   Ulnar Neuropathy surgical revision. 2004-2008. [place].     Clinical depression From 06/09/2016-no changes.She is doing fairly well, but occasionally gets off of either paroxetine or bupropion due to cost. Feels that medications are helping quite a bit and is  tolerating well.                                Hypertension, follow-up:  BP Readings from Last 3 Encounters:  01/10/17 130/88  09/30/16 (!) 144/83  09/30/16 (!) 144/83    She was last seen for hypertension 11 months ago.  BP at that visit was 130/2. Management since that visit includes; no changes.She reports good compliance with treatment. She is not having side effects. none She is not exercising. She is adherent to low salt diet.   Outside blood pressures are 140/80. She is experiencing none.  Patient denies none.   Cardiovascular risk factors include pre-diabetes.  Use of agents associated with hypertension: none.   ----------------------------------------------------------------    Lipid/Cholesterol, Follow-up:   Last seen for this 11 months ago.  Management since that visit includes; increased simvastatin to 40 mg qd. Advised to start aspirin 81 mg qd.  Last Lipid Panel:    Component Value Date/Time   CHOL 240 (H) 01/26/2016 0925   TRIG 109 01/26/2016 0925   HDL 48 01/26/2016 0925   CHOLHDL 5.0 (H) 01/26/2016 0925   LDLCALC 170 (H) 01/26/2016 0925    She reports good compliance with treatment. She is not having side effects. none  Wt Readings from Last 3 Encounters:  01/10/17 182 lb (82.6 kg)  09/30/16 183 lb 9.6 oz (83.3 kg)  09/30/16 183 lb 6 oz (83.2 kg)    ----------------------------------------------------------------  Also complains of hot flashes at night the last couple of months.   Allergies  Allergen  Reactions  . Aspirin     nausea with vomiting.  . Sulfa Antibiotics     hives.  . Penicillins Rash     Current Outpatient Prescriptions:  .  amLODipine (NORVASC) 10 MG tablet, TAKE 1 TABLET BY MOUTH ONCE A DAY, Disp: 30 tablet, Rfl: 3 .  buPROPion (WELLBUTRIN SR) 150 MG 12 hr tablet, TAKE 1 TABLET BY MOUTH TWICE A DAY, Disp: 60 tablet, Rfl: 5 .  clonazePAM (KLONOPIN) 0.5 MG tablet, Take 1 tablet by mouth 2 (two) times daily as needed  for anxiety. , Disp: , Rfl:  .  gabapentin (NEURONTIN) 300 MG capsule, Take 1 capsule (300 mg total) by mouth 3 (three) times daily., Disp: 90 capsule, Rfl: 3 .  HYDROcodone-acetaminophen (NORCO) 7.5-325 MG tablet, Take 1 tablet by mouth every 6 (six) hours as needed for moderate pain., Disp: 30 tablet, Rfl: 0 .  meloxicam (MOBIC) 15 MG tablet, TAKE 1 TABLET BY MOUTH DAILY WITH FOOD, Disp: 30 tablet, Rfl: 12 .  PARoxetine (PAXIL) 40 MG tablet, Take 1 tablet (40 mg total) by mouth daily., Disp: 30 tablet, Rfl: 5 .  promethazine (PHENERGAN) 25 MG tablet, TAKE 1 TABLET EVERY 4 TO 6 HOURS AS NEEDED FOR NAUSEA, Disp: 30 tablet, Rfl: 3 .  ranitidine (ZANTAC) 150 MG tablet, RANITIDINE HCL, 150MG  (Oral Tablet)  1 Two Times A Day for heartburn, indigestion, nausea for 0 days  Quantity: 60.00;  Refills: 5   Ordered :22-Jul-2010  Reginia Forts MD;  Buddy Duty 28-Jan-2010 Active Comments: DX: 787.02, Disp: , Rfl:  .  simvastatin (ZOCOR) 40 MG tablet, Take 1 tablet (40 mg total) by mouth daily., Disp: 30 tablet, Rfl: 6 .  traMADol (ULTRAM) 50 MG tablet, TAKE 1 TABLET BY MOUTH EVERY 8 HOURS AS NEEDED, Disp: 30 tablet, Rfl: 5 .  Vitamin D, Ergocalciferol, (DRISDOL) 50000 UNITS CAPS capsule, Take 50,000 Units by mouth once a week. , Disp: , Rfl:   Review of Systems  Constitutional: Positive for diaphoresis. Negative for appetite change, chills, fatigue and fever.  Respiratory: Negative for chest tightness and shortness of breath.   Cardiovascular: Negative for chest pain and palpitations.  Gastrointestinal: Negative for abdominal pain, nausea and vomiting.  Neurological: Negative for dizziness and weakness.    Social History  Substance Use Topics  . Smoking status: Current Every Day Smoker    Packs/day: 0.25  . Smokeless tobacco: Never Used     Comment:            . Alcohol use 0.0 oz/week     Comment: rarely   Objective:   BP 130/88 (BP Location: Right Arm, Patient Position: Sitting, Cuff Size: Large)    Pulse 82   Temp 98.3 F (36.8 C) (Oral)   Resp 16   Ht 5\' 5"  (1.651 m)   Wt 182 lb (82.6 kg)   SpO2 98%   BMI 30.29 kg/m  Vitals:   01/10/17 1100  BP: 130/88  Pulse: 82  Resp: 16  Temp: 98.3 F (36.8 C)  TempSrc: Oral  SpO2: 98%  Weight: 182 lb (82.6 kg)  Height: 5\' 5"  (1.651 m)     Physical Exam   General Appearance:    Alert, cooperative, no distress  Eyes:    PERRL, conjunctiva/corneas clear, EOM's intact       Lungs:     Clear to auscultation bilaterally, respirations unlabored  Heart:    Regular rate and rhythm  Neurologic:   Awake, alert, oriented x 3. No apparent focal  neurological           defect.           Assessment & Plan:     1. Menopausal symptoms  - estradiol (ESTRACE) 1 MG tablet; Take 1 tablet (1 mg total) by mouth daily.  Dispense: 30 tablet; Refill: 3  2. Benign essential HTN Well controlled.  Continue current medications.    - Comprehensive metabolic panel  3. Depression, unspecified depression type Continue bupropion and paroxetine  4. Vitamin D deficiency  - VITAMIN D 25 Hydroxy (Vit-D Deficiency, Fractures)  5. Hypercholesterolemia without hypertriglyceridemia She is tolerating simvastatin well with no adverse effects.   - Lipid panel  6. Chronic neck pain with radiculopathy Continue current pain medications, try OTC Lidocain 4% patch. She anticipates early follow up with her neurosurgeon.       Lelon Huh, MD  [facility]

## 2017-01-11 ENCOUNTER — Telehealth: Payer: Self-pay

## 2017-01-11 LAB — COMPREHENSIVE METABOLIC PANEL
A/G RATIO: 1.8 (ref 1.2–2.2)
ALK PHOS: 150 IU/L — AB (ref 39–117)
ALT: 18 IU/L (ref 0–32)
AST: 18 IU/L (ref 0–40)
Albumin: 4.3 g/dL (ref 3.5–5.5)
BILIRUBIN TOTAL: 0.3 mg/dL (ref 0.0–1.2)
BUN/Creatinine Ratio: 15 (ref 9–23)
BUN: 13 mg/dL (ref 6–24)
CHLORIDE: 105 mmol/L (ref 96–106)
CO2: 24 mmol/L (ref 18–29)
Calcium: 10 mg/dL (ref 8.7–10.2)
Creatinine, Ser: 0.86 mg/dL (ref 0.57–1.00)
GFR calc Af Amer: 89 mL/min/{1.73_m2} (ref >59)
GFR calc non Af Amer: 77 mL/min/{1.73_m2} (ref >59)
GLOBULIN, TOTAL: 2.4 g/dL (ref 1.5–4.5)
Glucose: 107 mg/dL — ABNORMAL HIGH (ref 65–99)
POTASSIUM: 4.7 mmol/L (ref 3.5–5.2)
Sodium: 145 mmol/L — ABNORMAL HIGH (ref 134–144)
Total Protein: 6.7 g/dL (ref 6.0–8.5)

## 2017-01-11 LAB — LIPID PANEL
CHOLESTEROL TOTAL: 213 mg/dL — AB (ref 100–199)
Chol/HDL Ratio: 4.1 ratio units (ref 0.0–4.4)
HDL: 52 mg/dL (ref >39)
LDL Calculated: 142 mg/dL — ABNORMAL HIGH (ref 0–99)
TRIGLYCERIDES: 95 mg/dL (ref 0–149)
VLDL CHOLESTEROL CAL: 19 mg/dL (ref 5–40)

## 2017-01-11 LAB — VITAMIN D 25 HYDROXY (VIT D DEFICIENCY, FRACTURES): VIT D 25 HYDROXY: 18.2 ng/mL — AB (ref 30.0–100.0)

## 2017-01-11 NOTE — Telephone Encounter (Signed)
LMTCB  Thanks,  -Dariel Pellecchia 

## 2017-01-11 NOTE — Telephone Encounter (Signed)
-----   Message from [name], MD sent at 01/11/2017  8:01 AM EDT ----- Cholesterol a little at 213, but better (was 240).  Vitamin D levels are very low, please verify weather patient is till taking weekly vitamin D, if not will need to restart. If she is then will need to change to OTC vitamin D3 5,000 units once a day Either way need to recheck vitamin d levels in 3 months.

## 2017-01-12 NOTE — Telephone Encounter (Signed)
LMTCB on cell number. Will try home number later.  Thanks,  -Joseline

## 2017-01-13 NOTE — Telephone Encounter (Signed)
Patient advised as below. She reports that she is currently taking Vitamin D 50,000 units once a week. As Dr. Caryn Section recommended below, I advised her to change to OTC Vitamin D3 5,000 units daily, and recheck labs in 3 months. Patient verbalized understanding.

## 2017-01-17 ENCOUNTER — Other Ambulatory Visit: Payer: Self-pay | Admitting: Family Medicine

## 2017-01-17 DIAGNOSIS — S149XXA Injury of unspecified nerves of neck, initial encounter: Secondary | ICD-10-CM

## 2017-01-17 MED ORDER — HYDROCODONE-ACETAMINOPHEN 7.5-325 MG PO TABS: 1.0000 | ORAL_TABLET | Freq: Four times a day (QID) | ORAL | 0 refills | Status: DC | PRN

## 2017-01-17 NOTE — Telephone Encounter (Signed)
Pt contacted office for refill request on the following medications: ° °HYDROcodone-acetaminophen (NORCO) 7.5-325 MG tablet ° °CB#[phone]/MW °

## 2017-01-24 ENCOUNTER — Other Ambulatory Visit: Payer: Self-pay | Admitting: Family Medicine

## 2017-01-24 DIAGNOSIS — S149XXA Injury of unspecified nerves of neck, initial encounter: Secondary | ICD-10-CM

## 2017-01-24 NOTE — Telephone Encounter (Signed)
Have written prescription for pick up. Remind patient Dr. Caryn Section wanted her to have an appointment in the office.

## 2017-01-24 NOTE — Telephone Encounter (Signed)
Pt requesting refill of HYDROcodone-acetaminophen (NORCO) 7.5-325 MG tablet  Pt states she is out.

## 2017-01-24 NOTE — Telephone Encounter (Signed)
Refill request for hydro-ace 7.5-325 mg 1 tab q6hrs Last filled by MD on- 01/17/2017 #30 x0 Last Appt: 01/10/2017 Next Appt: none Please advise refill?

## 2017-01-26 MED ORDER — HYDROCODONE-ACETAMINOPHEN 7.5-325 MG PO TABS: 1.0000 | ORAL_TABLET | Freq: Four times a day (QID) | ORAL | 0 refills | Status: DC | PRN

## 2017-02-01 ENCOUNTER — Other Ambulatory Visit: Payer: Self-pay | Admitting: Family Medicine

## 2017-02-01 DIAGNOSIS — S149XXA Injury of unspecified nerves of neck, initial encounter: Secondary | ICD-10-CM

## 2017-02-01 NOTE — Telephone Encounter (Signed)
Pt needs refill on her   HYDROcodone-acetaminophen (NORCO) 7.5-325 MG tablet  Thank sTeri

## 2017-02-02 MED ORDER — HYDROCODONE-ACETAMINOPHEN 7.5-325 MG PO TABS
1.0000 | ORAL_TABLET | Freq: Four times a day (QID) | ORAL | 0 refills | Status: DC | PRN
Start: 2017-02-02 — End: 2017-02-03

## 2017-02-03 ENCOUNTER — Other Ambulatory Visit: Payer: Self-pay | Admitting: *Deleted

## 2017-02-03 DIAGNOSIS — S149XXA Injury of unspecified nerves of neck, initial encounter: Secondary | ICD-10-CM

## 2017-02-03 MED ORDER — HYDROCODONE-ACETAMINOPHEN 7.5-325 MG PO TABS: 1.0000 | ORAL_TABLET | Freq: Four times a day (QID) | ORAL | 0 refills | Status: DC | PRN

## 2017-02-09 ENCOUNTER — Other Ambulatory Visit: Payer: Self-pay | Admitting: Family Medicine

## 2017-02-09 DIAGNOSIS — S149XXA Injury of unspecified nerves of neck, initial encounter: Secondary | ICD-10-CM

## 2017-02-09 MED ORDER — HYDROCODONE-ACETAMINOPHEN 7.5-325 MG PO TABS
1.0000 | ORAL_TABLET | Freq: Four times a day (QID) | ORAL | 0 refills | Status: DC | PRN
Start: 2017-02-09 — End: 2017-02-16

## 2017-02-09 MED ORDER — HYDROCODONE-ACETAMINOPHEN 7.5-325 MG PO TABS: 1.0000 | ORAL_TABLET | Freq: Four times a day (QID) | ORAL | 0 refills | Status: DC | PRN

## 2017-02-09 NOTE — Telephone Encounter (Signed)
Pt needs refill on her ° °HYDROcodone-acetaminophen (NORCO) 7.5-325 MG tablet ° °Thanks teri °

## 2017-02-16 ENCOUNTER — Other Ambulatory Visit: Payer: Self-pay | Admitting: Family Medicine

## 2017-02-16 DIAGNOSIS — S149XXA Injury of unspecified nerves of neck, initial encounter: Secondary | ICD-10-CM

## 2017-02-16 NOTE — Telephone Encounter (Signed)
Pt contacted office for refill request on the following medications: ° °HYDROcodone-acetaminophen (NORCO) 7.5-325 MG tablet ° °CB#[phone]/MW °

## 2017-02-17 MED ORDER — HYDROCODONE-ACETAMINOPHEN 7.5-325 MG PO TABS: 1.0000 | ORAL_TABLET | Freq: Four times a day (QID) | ORAL | 0 refills | Status: DC | PRN

## 2017-02-23 ENCOUNTER — Other Ambulatory Visit: Payer: Self-pay | Admitting: Family Medicine

## 2017-02-23 DIAGNOSIS — S149XXA Injury of unspecified nerves of neck, initial encounter: Secondary | ICD-10-CM

## 2017-02-23 NOTE — Telephone Encounter (Signed)
Pt needs refill on her pain meds  HYDROcodone-acetaminophen (NORCO) 7.5-325 MG tablet  Thanks Con Memos

## 2017-02-24 MED ORDER — HYDROCODONE-ACETAMINOPHEN 7.5-325 MG PO TABS: 1.0000 | ORAL_TABLET | Freq: Four times a day (QID) | ORAL | 0 refills | Status: DC | PRN

## 2017-03-03 ENCOUNTER — Other Ambulatory Visit: Payer: Self-pay | Admitting: Family Medicine

## 2017-03-03 DIAGNOSIS — S149XXA Injury of unspecified nerves of neck, initial encounter: Secondary | ICD-10-CM

## 2017-03-03 NOTE — Telephone Encounter (Signed)
Pt needs refil on her HYDROcodone-acetaminophen (NORCO) 7.5-325 MG tablet  Please call when ready  Thanks teri

## 2017-03-04 MED ORDER — HYDROCODONE-ACETAMINOPHEN 7.5-325 MG PO TABS: 1.0000 | ORAL_TABLET | Freq: Four times a day (QID) | ORAL | 0 refills | Status: DC | PRN

## 2017-03-10 ENCOUNTER — Other Ambulatory Visit: Payer: Self-pay | Admitting: Family Medicine

## 2017-03-10 ENCOUNTER — Other Ambulatory Visit: Payer: Self-pay | Admitting: *Deleted

## 2017-03-10 DIAGNOSIS — S149XXA Injury of unspecified nerves of neck, initial encounter: Secondary | ICD-10-CM

## 2017-03-10 MED ORDER — BUPROPION HCL ER (SR) 150 MG PO TB12: 150.0000 mg | ORAL_TABLET | Freq: Two times a day (BID) | ORAL | 11 refills | Status: DC

## 2017-03-10 NOTE — Telephone Encounter (Signed)
Pt contacted office for refill request on the following medications: ° °HYDROcodone-acetaminophen (NORCO) 7.5-325 MG tablet ° °CB#[phone]/MW °

## 2017-03-11 MED ORDER — HYDROCODONE-ACETAMINOPHEN 7.5-325 MG PO TABS: 1.0000 | ORAL_TABLET | Freq: Four times a day (QID) | ORAL | 0 refills | Status: DC | PRN

## 2017-03-15 ENCOUNTER — Other Ambulatory Visit: Payer: Self-pay | Admitting: Family Medicine

## 2017-03-15 DIAGNOSIS — M25512 Pain in left shoulder: Secondary | ICD-10-CM | POA: Diagnosis not present

## 2017-03-15 DIAGNOSIS — Z6829 Body mass index (BMI) 29.0-29.9, adult: Secondary | ICD-10-CM | POA: Diagnosis not present

## 2017-03-15 DIAGNOSIS — M542 Cervicalgia: Secondary | ICD-10-CM | POA: Diagnosis not present

## 2017-03-15 DIAGNOSIS — G8929 Other chronic pain: Secondary | ICD-10-CM | POA: Diagnosis not present

## 2017-03-15 DIAGNOSIS — I10 Essential (primary) hypertension: Secondary | ICD-10-CM | POA: Diagnosis not present

## 2017-03-17 ENCOUNTER — Other Ambulatory Visit: Payer: Self-pay | Admitting: Family Medicine

## 2017-03-17 DIAGNOSIS — S149XXA Injury of unspecified nerves of neck, initial encounter: Secondary | ICD-10-CM

## 2017-03-17 MED ORDER — HYDROCODONE-ACETAMINOPHEN 7.5-325 MG PO TABS: 1.0000 | ORAL_TABLET | Freq: Four times a day (QID) | ORAL | 0 refills | Status: DC | PRN

## 2017-03-17 NOTE — Telephone Encounter (Signed)
Pt contacted office for refill request on the following medications: ° °HYDROcodone-acetaminophen (NORCO) 7.5-325 MG tablet ° °CB#[phone]/MW °

## 2017-03-24 ENCOUNTER — Other Ambulatory Visit: Payer: Self-pay | Admitting: Family Medicine

## 2017-03-24 DIAGNOSIS — S149XXA Injury of unspecified nerves of neck, initial encounter: Secondary | ICD-10-CM

## 2017-03-24 NOTE — Telephone Encounter (Signed)
Please review-aa 

## 2017-03-24 NOTE — Telephone Encounter (Signed)
Pt contacted office for refill request on the following medications: ° °HYDROcodone-acetaminophen (NORCO) 7.5-325 MG tablet ° °CB#[phone]/MW °

## 2017-03-24 NOTE — Telephone Encounter (Signed)
Pt cell number is [phone]  Thank sTeri

## 2017-03-25 DIAGNOSIS — M25512 Pain in left shoulder: Secondary | ICD-10-CM | POA: Diagnosis not present

## 2017-03-25 DIAGNOSIS — M542 Cervicalgia: Secondary | ICD-10-CM | POA: Diagnosis not present

## 2017-03-25 MED ORDER — HYDROCODONE-ACETAMINOPHEN 7.5-325 MG PO TABS: 1.0000 | ORAL_TABLET | Freq: Four times a day (QID) | ORAL | 0 refills | Status: DC | PRN

## 2017-03-31 ENCOUNTER — Other Ambulatory Visit: Payer: Self-pay | Admitting: Family Medicine

## 2017-03-31 DIAGNOSIS — S149XXA Injury of unspecified nerves of neck, initial encounter: Secondary | ICD-10-CM

## 2017-03-31 DIAGNOSIS — M25512 Pain in left shoulder: Secondary | ICD-10-CM | POA: Diagnosis not present

## 2017-03-31 DIAGNOSIS — M542 Cervicalgia: Secondary | ICD-10-CM | POA: Diagnosis not present

## 2017-03-31 NOTE — Telephone Encounter (Signed)
Pt needs refill on her HYDROcodone-acetaminophen (NORCO) 7.5-325 MG tablet  Thanks Con Memos

## 2017-03-31 NOTE — Telephone Encounter (Signed)
Please review. Thanks!  

## 2017-04-01 ENCOUNTER — Other Ambulatory Visit: Payer: Self-pay | Admitting: Family Medicine

## 2017-04-01 MED ORDER — HYDROCODONE-ACETAMINOPHEN 7.5-325 MG PO TABS: 1.0000 | ORAL_TABLET | Freq: Four times a day (QID) | ORAL | 0 refills | Status: DC | PRN

## 2017-04-04 DIAGNOSIS — M542 Cervicalgia: Secondary | ICD-10-CM | POA: Diagnosis not present

## 2017-04-04 DIAGNOSIS — M25512 Pain in left shoulder: Secondary | ICD-10-CM | POA: Diagnosis not present

## 2017-04-07 ENCOUNTER — Other Ambulatory Visit: Payer: Self-pay | Admitting: Family Medicine

## 2017-04-07 DIAGNOSIS — M542 Cervicalgia: Secondary | ICD-10-CM | POA: Diagnosis not present

## 2017-04-07 DIAGNOSIS — S149XXA Injury of unspecified nerves of neck, initial encounter: Secondary | ICD-10-CM

## 2017-04-07 DIAGNOSIS — M25512 Pain in left shoulder: Secondary | ICD-10-CM | POA: Diagnosis not present

## 2017-04-07 NOTE — Telephone Encounter (Signed)
Pt contacted office for refill request on the following medications: HYDROcodone-acetaminophen ([place]) 7.5-325 MG tablet  Last Rx: 04/01/17 LOV: 01/10/17 Please advise. Thanks TNP

## 2017-04-09 ENCOUNTER — Other Ambulatory Visit: Payer: Self-pay | Admitting: Family Medicine

## 2017-04-09 DIAGNOSIS — I10 Essential (primary) hypertension: Secondary | ICD-10-CM

## 2017-04-11 ENCOUNTER — Telehealth: Payer: Self-pay | Admitting: Family Medicine

## 2017-04-11 DIAGNOSIS — E559 Vitamin D deficiency, unspecified: Secondary | ICD-10-CM

## 2017-04-11 MED ORDER — HYDROCODONE-ACETAMINOPHEN 7.5-325 MG PO TABS: 1.0000 | ORAL_TABLET | Freq: Four times a day (QID) | ORAL | 0 refills | Status: DC | PRN

## 2017-04-11 NOTE — Telephone Encounter (Signed)
Patient was notified. Lab slip printed and ready to pick-up.

## 2017-04-11 NOTE — Telephone Encounter (Signed)
-----   Message from [name], MD sent at 01/14/2017  3:56 PM EDT ----- Regarding: recheck vitamind June 2018 Changed to otc d3 5000 units daily 3/23

## 2017-04-11 NOTE — Telephone Encounter (Signed)
Please advise patient it is time to recheck vitamin D levels since we changed dose in March.

## 2017-04-13 DIAGNOSIS — M25512 Pain in left shoulder: Secondary | ICD-10-CM | POA: Diagnosis not present

## 2017-04-13 DIAGNOSIS — M542 Cervicalgia: Secondary | ICD-10-CM | POA: Diagnosis not present

## 2017-04-13 LAB — VITAMIN D 25 HYDROXY (VIT D DEFICIENCY, FRACTURES): Vit D, 25-Hydroxy: 23.8 ng/mL — ABNORMAL LOW (ref 30.0–100.0)

## 2017-04-14 DIAGNOSIS — M25512 Pain in left shoulder: Secondary | ICD-10-CM | POA: Diagnosis not present

## 2017-04-14 DIAGNOSIS — M542 Cervicalgia: Secondary | ICD-10-CM | POA: Diagnosis not present

## 2017-04-19 ENCOUNTER — Other Ambulatory Visit: Payer: Self-pay

## 2017-04-19 DIAGNOSIS — M542 Cervicalgia: Secondary | ICD-10-CM | POA: Diagnosis not present

## 2017-04-19 DIAGNOSIS — M25512 Pain in left shoulder: Secondary | ICD-10-CM | POA: Diagnosis not present

## 2017-04-19 DIAGNOSIS — S149XXA Injury of unspecified nerves of neck, initial encounter: Secondary | ICD-10-CM

## 2017-04-19 NOTE — Telephone Encounter (Signed)
Patient called requesting a refill on Hydrocodone medication. She is aware that Dr. Caryn Section is out of the office. Contact number is correct. Thanks!

## 2017-04-20 MED ORDER — HYDROCODONE-ACETAMINOPHEN 7.5-325 MG PO TABS: 1.0000 | ORAL_TABLET | Freq: Four times a day (QID) | ORAL | 0 refills | Status: DC | PRN

## 2017-04-21 DIAGNOSIS — M542 Cervicalgia: Secondary | ICD-10-CM | POA: Diagnosis not present

## 2017-04-21 DIAGNOSIS — M25512 Pain in left shoulder: Secondary | ICD-10-CM | POA: Diagnosis not present

## 2017-04-28 ENCOUNTER — Other Ambulatory Visit: Payer: Self-pay | Admitting: Emergency Medicine

## 2017-04-28 DIAGNOSIS — S149XXA Injury of unspecified nerves of neck, initial encounter: Secondary | ICD-10-CM

## 2017-04-28 MED ORDER — HYDROCODONE-ACETAMINOPHEN 7.5-325 MG PO TABS: 1.0000 | ORAL_TABLET | Freq: Four times a day (QID) | ORAL | 0 refills | Status: DC | PRN

## 2017-04-28 NOTE — Telephone Encounter (Signed)
Pt requesting refill. Please advise °

## 2017-04-29 ENCOUNTER — Encounter: Payer: Self-pay | Admitting: Family Medicine

## 2017-04-29 ENCOUNTER — Ambulatory Visit (INDEPENDENT_AMBULATORY_CARE_PROVIDER_SITE_OTHER): Payer: BLUE CROSS/BLUE SHIELD | Admitting: Family Medicine

## 2017-04-29 VITALS — BP 140/88 | HR 69 | Temp 97.6°F | Resp 16 | Wt 181.0 lb

## 2017-04-29 DIAGNOSIS — N390 Urinary tract infection, site not specified: Secondary | ICD-10-CM | POA: Diagnosis not present

## 2017-04-29 DIAGNOSIS — R300 Dysuria: Secondary | ICD-10-CM | POA: Diagnosis not present

## 2017-04-29 DIAGNOSIS — R319 Hematuria, unspecified: Secondary | ICD-10-CM

## 2017-04-29 LAB — POCT URINALYSIS DIPSTICK
Bilirubin, UA: NEGATIVE
GLUCOSE UA: NEGATIVE
Ketones, UA: NEGATIVE
NITRITE UA: POSITIVE
PROTEIN UA: NEGATIVE
Spec Grav, UA: 1.02 (ref 1.010–1.025)
UROBILINOGEN UA: 0.2 U/dL
pH, UA: 6 (ref 5.0–8.0)

## 2017-04-29 MED ORDER — CIPROFLOXACIN HCL 500 MG PO TABS: 500.0000 mg | ORAL_TABLET | Freq: Two times a day (BID) | ORAL | 0 refills | Status: AC

## 2017-04-29 NOTE — Progress Notes (Signed)
Patient: Jamie Williams Female    DOB: [birth year]   56 y.o.   MRN: [id number] Visit Date: 04/29/2017  Today's Provider: Lelon Huh, MD   Chief Complaint  Patient presents with  . Back Pain   Subjective:    Back Pain  This is a new problem. Episode onset: 5 days ago. The problem occurs constantly. The problem has been gradually worsening since onset. The pain is present in the lumbar spine (left side). Radiates to: left buttock. Associated symptoms include dysuria. Pertinent negatives include no abdominal pain, bladder incontinence, bowel incontinence, chest pain, fever, headaches, leg pain, numbness, paresis, paresthesias, pelvic pain, perianal numbness, tingling, weakness or weight loss.  Patient states her urine is darker than usual and has an odor.      Allergies  Allergen Reactions  . Aspirin     nausea with vomiting.  . Sulfa Antibiotics     hives.  . Penicillins Rash     Current Outpatient Prescriptions:  .  amLODipine (NORVASC) 10 MG tablet, TAKE 1 TABLET BY MOUTH ONCE A DAY, Disp: 30 tablet, Rfl: 5 .  buPROPion (WELLBUTRIN SR) 150 MG 12 hr tablet, TAKE 1 TABLET BY MOUTH TWICE A DAY, Disp: 60 tablet, Rfl: 5 .  clonazePAM (KLONOPIN) 0.5 MG tablet, Take 1 tablet by mouth 2 (two) times daily as needed for anxiety. , Disp: , Rfl:  .  estradiol (ESTRACE) 1 MG tablet, Take 1 tablet (1 mg total) by mouth daily., Disp: 30 tablet, Rfl: 3 .  gabapentin (NEURONTIN) 300 MG capsule, Take 1 capsule (300 mg total) by mouth 3 (three) times daily., Disp: 90 capsule, Rfl: 3 .  HYDROcodone-acetaminophen (NORCO) 7.5-325 MG tablet, Take 1 tablet by mouth every 6 (six) hours as needed for moderate pain., Disp: 30 tablet, Rfl: 0 .  meloxicam (MOBIC) 15 MG tablet, TAKE 1 TABLET BY MOUTH DAILY WITH FOOD, Disp: 30 tablet, Rfl: 12 .  PARoxetine (PAXIL) 40 MG tablet, Take 1 tablet (40 mg total) by mouth daily., Disp: 30 tablet, Rfl: 5 .  promethazine (PHENERGAN) 25 MG tablet, TAKE 1 TABLET  EVERY 4 TO 6 HOURS AS NEEDED FOR NAUSEA, Disp: 30 tablet, Rfl: 3 .  ranitidine (ZANTAC) 150 MG tablet, RANITIDINE HCL, 150MG  (Oral Tablet)  1 Two Times A Day for heartburn, indigestion, nausea for 0 days  Quantity: 60.00;  Refills: 5   Ordered :22-Jul-2010  Reginia Forts MD;  Buddy Duty 28-Jan-2010 Active Comments: DX: 787.02, Disp: , Rfl:  .  simvastatin (ZOCOR) 40 MG tablet, Take 1 tablet (40 mg total) by mouth daily., Disp: 30 tablet, Rfl: 6 .  traMADol (ULTRAM) 50 MG tablet, TAKE 1 TABLET BY MOUTH EVERY 8 HOURS AS NEEDED, Disp: 30 tablet, Rfl: 5 .  Vitamin D, Ergocalciferol, (DRISDOL) 50000 UNITS CAPS capsule, Take 50,000 Units by mouth once a week. , Disp: , Rfl:   Review of Systems  Constitutional: Negative for appetite change, chills, fatigue, fever and weight loss.  Respiratory: Negative for chest tightness and shortness of breath.   Cardiovascular: Negative for chest pain and palpitations.  Gastrointestinal: Negative for abdominal pain, bowel incontinence, nausea and vomiting.  Genitourinary: Positive for dysuria, frequency and urgency. Negative for bladder incontinence and pelvic pain.  Musculoskeletal: Positive for back pain.  Neurological: Negative for dizziness, tingling, weakness, numbness, headaches and paresthesias.    Social History  Substance Use Topics  . Smoking status: Current Every Day Smoker    Packs/day: 0.50  . Smokeless tobacco: Never  Used     Comment: Started about 56 years old usually 1/2 ppd  . Alcohol use 0.0 oz/week     Comment: rarely   Objective:   BP 140/88 (BP Location: Left Arm, Patient Position: Sitting, Cuff Size: Large)   Pulse 69   Temp 97.6 F (36.4 C) (Oral)   Resp 16   Wt 181 lb (82.1 kg)   SpO2 99% Comment: room air  BMI 30.12 kg/m  Vitals:   04/29/17 0851  BP: 140/88  Pulse: 69  Resp: 16  Temp: 97.6 F (36.4 C)  TempSrc: Oral  SpO2: 99%  Weight: 181 lb (82.1 kg)     Physical Exam  General Appearance:    Alert, cooperative, no  distress  Eyes:    PERRL, conjunctiva/corneas clear, EOM's intact       Lungs:     Clear to auscultation bilaterally, respirations unlabored  Heart:    Regular rate and rhythm  MS:   Tender left para lumbar muscles.   Abdomen:   soft or nontender. CVA tenderness is present on the left        Assessment & Plan:     1. Dysuria  - POCT Urinalysis Dipstick  2. Urinary tract infection with hematuria, site unspecified  - Urine Culture - ciprofloxacin (CIPRO) 500 MG tablet; Take 1 tablet (500 mg total) by mouth 2 (two) times daily.  Dispense: 20 tablet; Refill: 0  The entirety of the information documented in the History of Present Illness, Review of Systems and Physical Exam were personally obtained by me. Portions of this information were initially documented by Meyer Cory, CMA and reviewed by me for thoroughness and accuracy.        Lelon Huh, MD  [facility]

## 2017-05-01 LAB — URINE CULTURE

## 2017-05-04 ENCOUNTER — Other Ambulatory Visit: Payer: Self-pay | Admitting: Family Medicine

## 2017-05-04 DIAGNOSIS — S149XXA Injury of unspecified nerves of neck, initial encounter: Secondary | ICD-10-CM

## 2017-05-04 MED ORDER — HYDROCODONE-ACETAMINOPHEN 7.5-325 MG PO TABS: 1.0000 | ORAL_TABLET | Freq: Four times a day (QID) | ORAL | 0 refills | Status: DC | PRN

## 2017-05-04 NOTE — Telephone Encounter (Signed)
Please review. Thanks!  

## 2017-05-04 NOTE — Telephone Encounter (Signed)
Pt needs a refill on her   HYDROcodone-acetaminophen (NORCO) 7.5-325 MG tablet  Thanks Con Memos

## 2017-05-10 ENCOUNTER — Encounter: Payer: Self-pay | Admitting: Family Medicine

## 2017-05-10 ENCOUNTER — Ambulatory Visit (INDEPENDENT_AMBULATORY_CARE_PROVIDER_SITE_OTHER): Payer: BLUE CROSS/BLUE SHIELD | Admitting: Family Medicine

## 2017-05-10 VITALS — BP 134/84 | HR 84 | Temp 98.1°F | Resp 16 | Wt 178.0 lb

## 2017-05-10 DIAGNOSIS — R202 Paresthesia of skin: Secondary | ICD-10-CM

## 2017-05-10 DIAGNOSIS — S39012S Strain of muscle, fascia and tendon of lower back, sequela: Secondary | ICD-10-CM

## 2017-05-10 DIAGNOSIS — N390 Urinary tract infection, site not specified: Secondary | ICD-10-CM

## 2017-05-10 LAB — POCT URINALYSIS DIPSTICK
Bilirubin, UA: NEGATIVE
Glucose, UA: NEGATIVE
KETONES UA: NEGATIVE
LEUKOCYTES UA: NEGATIVE
Nitrite, UA: NEGATIVE
PH UA: 6 (ref 5.0–8.0)
Protein, UA: NEGATIVE
Spec Grav, UA: 1.025 (ref 1.010–1.025)
Urobilinogen, UA: 0.2 E.U./dL

## 2017-05-10 MED ORDER — GABAPENTIN 300 MG PO CAPS: 300.0000 mg | ORAL_CAPSULE | Freq: Three times a day (TID) | ORAL | 3 refills | Status: DC

## 2017-05-10 MED ORDER — CYCLOBENZAPRINE HCL 5 MG PO TABS: 5.0000 mg | ORAL_TABLET | Freq: Three times a day (TID) | ORAL | 1 refills | Status: DC | PRN

## 2017-05-10 NOTE — Progress Notes (Signed)
Patient: Jamie Williams Female    DOB: 1961-07-14   56 y.o.   MRN: [id number] Visit Date: 05/10/2017  Today's Provider: Lelon Huh, MD   Chief Complaint  Patient presents with  . Follow-up  . Urinary Tract Infection   Subjective:    HPI Back Pain Has been having back pain for a couple of weeks, treated for UTI as below without improvement. No known injury. Is taking meloxicam consistently. Pain does not radiate. She does report remote history of kidney stones.   Urinary tract infection with hematuria, site unspecified From 04/29/2017-treated with ciprofloxacin (CIPRO) 500 MG tablet. Patient reports she is still having pain in lower back, painful, and nausea.    Allergies  Allergen Reactions  . Aspirin     nausea with vomiting.  . Sulfa Antibiotics     hives.  . Penicillins Rash     Current Outpatient Prescriptions:  .  amLODipine (NORVASC) 10 MG tablet, TAKE 1 TABLET BY MOUTH ONCE A DAY, Disp: 30 tablet, Rfl: 5 .  buPROPion (WELLBUTRIN SR) 150 MG 12 hr tablet, TAKE 1 TABLET BY MOUTH TWICE A DAY, Disp: 60 tablet, Rfl: 5 .  clonazePAM (KLONOPIN) 0.5 MG tablet, Take 1 tablet by mouth 2 (two) times daily as needed for anxiety. , Disp: , Rfl:  .  estradiol (ESTRACE) 1 MG tablet, Take 1 tablet (1 mg total) by mouth daily., Disp: 30 tablet, Rfl: 3 .  gabapentin (NEURONTIN) 300 MG capsule, Take 1 capsule (300 mg total) by mouth 3 (three) times daily., Disp: 90 capsule, Rfl: 3 .  HYDROcodone-acetaminophen (NORCO) 7.5-325 MG tablet, Take 1 tablet by mouth every 6 (six) hours as needed for moderate pain., Disp: 30 tablet, Rfl: 0 .  meloxicam (MOBIC) 15 MG tablet, TAKE 1 TABLET BY MOUTH DAILY WITH FOOD, Disp: 30 tablet, Rfl: 12 .  PARoxetine (PAXIL) 40 MG tablet, Take 1 tablet (40 mg total) by mouth daily., Disp: 30 tablet, Rfl: 5 .  promethazine (PHENERGAN) 25 MG tablet, TAKE 1 TABLET EVERY 4 TO 6 HOURS AS NEEDED FOR NAUSEA, Disp: 30 tablet, Rfl: 3 .  ranitidine (ZANTAC) 150  MG tablet, RANITIDINE HCL, 150MG  (Oral Tablet)  1 Two Times A Day for heartburn, indigestion, nausea for 0 days  Quantity: 60.00;  Refills: 5   Ordered :22-Jul-2010  Reginia Forts MD;  Buddy Duty 28-Jan-2010 Active Comments: DX: 787.02, Disp: , Rfl:  .  simvastatin (ZOCOR) 40 MG tablet, Take 1 tablet (40 mg total) by mouth daily., Disp: 30 tablet, Rfl: 6 .  traMADol (ULTRAM) 50 MG tablet, TAKE 1 TABLET BY MOUTH EVERY 8 HOURS AS NEEDED, Disp: 30 tablet, Rfl: 5 .  Vitamin D, Ergocalciferol, (DRISDOL) 50000 UNITS CAPS capsule, Take 50,000 Units by mouth once a week. , Disp: , Rfl:   Review of Systems  Constitutional: Negative for appetite change, chills, fatigue and fever.  Respiratory: Negative for chest tightness and shortness of breath.   Cardiovascular: Negative for chest pain and palpitations.  Gastrointestinal: Positive for nausea. Negative for abdominal pain and vomiting.  Genitourinary: Positive for dysuria.  Musculoskeletal: Positive for back pain.  Neurological: Negative for dizziness and weakness.    Social History  Substance Use Topics  . Smoking status: Current Every Day Smoker    Packs/day: 0.50  . Smokeless tobacco: Never Used     Comment: Started about 56 years old usually 1/2 ppd  . Alcohol use 0.0 oz/week     Comment: rarely   Objective:  BP 134/84 (BP Location: Right Arm, Patient Position: Sitting, Cuff Size: Large)   Pulse 84   Temp 98.1 F (36.7 C) (Oral)   Resp 16   Wt 178 lb (80.7 kg)   SpO2 98%   BMI 29.62 kg/m  Vitals:   05/10/17 1603  BP: 134/84  Pulse: 84  Resp: 16  Temp: 98.1 F (36.7 C)  TempSrc: Oral  SpO2: 98%  Weight: 178 lb (80.7 kg)     Physical Exam  General appearance: alert, well developed, well nourished, cooperative and in no distress Head: Normocephalic, without obvious abnormality, atraumatic Respiratory: Respirations even and unlabored, normal respiratory rate MS: Diffuse tenderness of left para lumbar muscles. Mild tenderness of  lumbar spine.   Results for orders placed or performed in visit on 05/10/17  POCT urinalysis dipstick  Result Value Ref Range   Color, UA Dark Yellow    Clarity, UA Cloudy    Glucose, UA Neg    Bilirubin, UA Neg    Ketones, UA Neg    Spec Grav, UA 1.025 1.010 - 1.025   Blood, UA trace    pH, UA 6.0 5.0 - 8.0   Protein, UA Neg    Urobilinogen, UA 0.2 0.2 or 1.0 E.U./dL   Nitrite, UA Neg    Leukocytes, UA Negative Negative       Assessment & Plan:     1. Urinary tract infection without hematuria, site unspecified Resolved - POCT urinalysis dipstick  2. Strain of lumbar region, sequela  - cyclobenzaprine (FLEXERIL) 5 MG tablet; Take 1-2 tablets (5-10 mg total) by mouth 3 (three) times daily as needed for muscle spasms.  Dispense: 30 tablet; Refill: 1 She does report remote history of renal stones, although she is tender in MS tissue of back. Consider imaging if not improving after a few days on muscle relaxer.   3. Left hand paresthesia  - gabapentin (NEURONTIN) 300 MG capsule; Take 1 capsule (300 mg total) by mouth 3 (three) times daily.  Dispense: 90 capsule; Refill: 3       Lelon Huh, MD  [facility]

## 2017-05-10 NOTE — Patient Instructions (Signed)
   Call back for order to have xray of back if not improved in 2-3 days   Apply ice to sore areas of back for 10 minutes three times a day

## 2017-05-13 ENCOUNTER — Other Ambulatory Visit: Payer: Self-pay | Admitting: Family Medicine

## 2017-05-13 DIAGNOSIS — S149XXA Injury of unspecified nerves of neck, initial encounter: Secondary | ICD-10-CM

## 2017-05-13 MED ORDER — HYDROCODONE-ACETAMINOPHEN 7.5-325 MG PO TABS: 1.0000 | ORAL_TABLET | Freq: Four times a day (QID) | ORAL | 0 refills | Status: DC | PRN

## 2017-05-13 NOTE — Telephone Encounter (Signed)
LOV 05/10/2017. Was given refill of Norco 7.5-325 mg take 1 tablet po q 6 hrs PRN #30 on 05/04/2017.

## 2017-05-13 NOTE — Telephone Encounter (Signed)
Pt contacted office for refill request on the following medications:  CB#[id number]     HYDROcodone-acetaminophen (NORCO) 7.5-325 MG tablet

## 2017-05-20 ENCOUNTER — Other Ambulatory Visit: Payer: Self-pay | Admitting: Family Medicine

## 2017-05-20 DIAGNOSIS — S149XXA Injury of unspecified nerves of neck, initial encounter: Secondary | ICD-10-CM

## 2017-05-20 MED ORDER — HYDROCODONE-ACETAMINOPHEN 7.5-325 MG PO TABS: 1.0000 | ORAL_TABLET | Freq: Four times a day (QID) | ORAL | 0 refills | Status: DC | PRN

## 2017-05-20 NOTE — Telephone Encounter (Signed)
Last fill was 05/13/17-aa

## 2017-05-20 NOTE — Telephone Encounter (Signed)
Pt contacted office for refill request on the following medications: ° °HYDROcodone-acetaminophen (NORCO) 7.5-325 MG tablet ° °CB#[phone]/MW °

## 2017-05-27 ENCOUNTER — Other Ambulatory Visit: Payer: Self-pay | Admitting: Family Medicine

## 2017-05-27 DIAGNOSIS — S149XXA Injury of unspecified nerves of neck, initial encounter: Secondary | ICD-10-CM

## 2017-05-27 MED ORDER — HYDROCODONE-ACETAMINOPHEN 7.5-325 MG PO TABS: 1.0000 | ORAL_TABLET | Freq: Four times a day (QID) | ORAL | 0 refills | Status: DC | PRN

## 2017-05-27 NOTE — Telephone Encounter (Signed)
Pt is requesting a refill of her HYDROcodone-acetaminophen (NORCO) 7.5-325 MG tablet , states she is currently out.

## 2017-06-06 ENCOUNTER — Other Ambulatory Visit: Payer: Self-pay | Admitting: Family Medicine

## 2017-06-06 DIAGNOSIS — S149XXA Injury of unspecified nerves of neck, initial encounter: Secondary | ICD-10-CM

## 2017-06-06 NOTE — Telephone Encounter (Signed)
Please review. Thanks!  

## 2017-06-06 NOTE — Telephone Encounter (Signed)
Pt contacted office for refill request on the following medications: ° °HYDROcodone-acetaminophen (NORCO) 7.5-325 MG tablet ° °CB#[phone]/MW °

## 2017-06-07 MED ORDER — HYDROCODONE-ACETAMINOPHEN 7.5-325 MG PO TABS: 1.0000 | ORAL_TABLET | Freq: Four times a day (QID) | ORAL | 0 refills | Status: DC | PRN

## 2017-06-14 ENCOUNTER — Other Ambulatory Visit: Payer: Self-pay | Admitting: Family Medicine

## 2017-06-14 DIAGNOSIS — S149XXA Injury of unspecified nerves of neck, initial encounter: Secondary | ICD-10-CM

## 2017-06-14 NOTE — Telephone Encounter (Signed)
Pt contacted office for refill request on the following medications: ° °HYDROcodone-acetaminophen (NORCO) 7.5-325 MG tablet ° °CB#[phone]/MW °

## 2017-06-15 ENCOUNTER — Other Ambulatory Visit: Payer: Self-pay | Admitting: Family Medicine

## 2017-06-15 DIAGNOSIS — N951 Menopausal and female climacteric states: Secondary | ICD-10-CM

## 2017-06-15 MED ORDER — HYDROCODONE-ACETAMINOPHEN 7.5-325 MG PO TABS: 1.0000 | ORAL_TABLET | Freq: Four times a day (QID) | ORAL | 0 refills | Status: DC | PRN

## 2017-06-21 ENCOUNTER — Other Ambulatory Visit: Payer: Self-pay | Admitting: Family Medicine

## 2017-06-21 DIAGNOSIS — S149XXA Injury of unspecified nerves of neck, initial encounter: Secondary | ICD-10-CM

## 2017-06-21 NOTE — Telephone Encounter (Signed)
Lat fill was 06/15/17-aa

## 2017-06-21 NOTE — Telephone Encounter (Signed)
Pt contacted office for refill request on the following medications:  HYDROcodone-acetaminophen (NORCO) 7.5-325 MG tablet   XE#[phone]

## 2017-06-22 MED ORDER — HYDROCODONE-ACETAMINOPHEN 7.5-325 MG PO TABS: 1.0000 | ORAL_TABLET | Freq: Four times a day (QID) | ORAL | 0 refills | Status: DC | PRN

## 2017-06-30 ENCOUNTER — Other Ambulatory Visit: Payer: Self-pay | Admitting: Family Medicine

## 2017-06-30 DIAGNOSIS — S149XXA Injury of unspecified nerves of neck, initial encounter: Secondary | ICD-10-CM

## 2017-06-30 MED ORDER — HYDROCODONE-ACETAMINOPHEN 7.5-325 MG PO TABS: 1.0000 | ORAL_TABLET | Freq: Four times a day (QID) | ORAL | 0 refills | Status: DC | PRN

## 2017-06-30 NOTE — Telephone Encounter (Signed)
Pt needs refill on her hydrocodone  Thanks Con Memos

## 2017-07-06 ENCOUNTER — Other Ambulatory Visit: Payer: Self-pay | Admitting: Family Medicine

## 2017-07-06 DIAGNOSIS — S149XXA Injury of unspecified nerves of neck, initial encounter: Secondary | ICD-10-CM

## 2017-07-06 MED ORDER — HYDROCODONE-ACETAMINOPHEN 7.5-325 MG PO TABS: 1.0000 | ORAL_TABLET | Freq: Four times a day (QID) | ORAL | 0 refills | Status: DC | PRN

## 2017-07-06 NOTE — Telephone Encounter (Signed)
Pt needs refill on her hydrocodone 7.5-325 ° °Thanks teri °

## 2017-07-14 ENCOUNTER — Other Ambulatory Visit: Payer: Self-pay | Admitting: Family Medicine

## 2017-07-14 DIAGNOSIS — S149XXA Injury of unspecified nerves of neck, initial encounter: Secondary | ICD-10-CM

## 2017-07-14 MED ORDER — HYDROCODONE-ACETAMINOPHEN 7.5-325 MG PO TABS: 1.0000 | ORAL_TABLET | Freq: Four times a day (QID) | ORAL | 0 refills | Status: DC | PRN

## 2017-07-14 NOTE — Telephone Encounter (Signed)
Pt contacted office for refill request on the following medications:  HYDROcodone-acetaminophen (NORCO) 7.5-325 MG tablet   Pt is requesting a 30 day supply due to insurance has changed and will only cover a 30 day supply.  MN#[phone]

## 2017-07-21 ENCOUNTER — Other Ambulatory Visit: Payer: Self-pay | Admitting: Family Medicine

## 2017-07-21 DIAGNOSIS — S149XXA Injury of unspecified nerves of neck, initial encounter: Secondary | ICD-10-CM

## 2017-07-21 NOTE — Telephone Encounter (Signed)
Pt needs refill on hydrocodone 7.5-325 mg  Thanks teri

## 2017-07-22 MED ORDER — HYDROCODONE-ACETAMINOPHEN 7.5-325 MG PO TABS: 1.0000 | ORAL_TABLET | Freq: Four times a day (QID) | ORAL | 0 refills | Status: DC | PRN

## 2017-07-28 ENCOUNTER — Other Ambulatory Visit: Payer: Self-pay | Admitting: Family Medicine

## 2017-07-28 DIAGNOSIS — S149XXA Injury of unspecified nerves of neck, initial encounter: Secondary | ICD-10-CM

## 2017-07-28 NOTE — Telephone Encounter (Signed)
Pt contacted office for refill request on the following medications: HYDROcodone-acetaminophen ([place]) 7.5-325 MG tablet Last Rx: 07/22/17 LOV: 05/10/17 Please advise. Thanks TNP

## 2017-07-29 MED ORDER — HYDROCODONE-ACETAMINOPHEN 7.5-325 MG PO TABS: 1.0000 | ORAL_TABLET | Freq: Four times a day (QID) | ORAL | 0 refills | Status: DC | PRN

## 2017-08-04 ENCOUNTER — Other Ambulatory Visit: Payer: Self-pay | Admitting: Family Medicine

## 2017-08-04 DIAGNOSIS — S149XXA Injury of unspecified nerves of neck, initial encounter: Secondary | ICD-10-CM

## 2017-08-04 NOTE — Telephone Encounter (Signed)
Pt contacted office for refill request on the following medications:  HYDROcodone-acetaminophen ([place]) 7.5-325 MG tablet  Last Rx: 07/29/17 LOV: 05/10/17  Please advise. Thanks TNP

## 2017-08-05 MED ORDER — HYDROCODONE-ACETAMINOPHEN 7.5-325 MG PO TABS: 1.0000 | ORAL_TABLET | Freq: Four times a day (QID) | ORAL | 0 refills | Status: DC | PRN

## 2017-08-15 ENCOUNTER — Other Ambulatory Visit: Payer: Self-pay | Admitting: Family Medicine

## 2017-08-15 DIAGNOSIS — S149XXA Injury of unspecified nerves of neck, initial encounter: Secondary | ICD-10-CM

## 2017-08-15 NOTE — Telephone Encounter (Signed)
Pt contacted office for refill request on the following medications:  HYDROcodone-acetaminophen ([place]) 7.5-325 MG tablet  Last Rx: 08/05/17 Please advise. Thanks TNP

## 2017-08-16 MED ORDER — HYDROCODONE-ACETAMINOPHEN 7.5-325 MG PO TABS: 1.0000 | ORAL_TABLET | Freq: Four times a day (QID) | ORAL | 0 refills | Status: DC | PRN

## 2017-08-18 ENCOUNTER — Other Ambulatory Visit: Payer: Self-pay | Admitting: Family Medicine

## 2017-08-18 NOTE — Telephone Encounter (Signed)
Please call in tramadol.  

## 2017-08-19 NOTE — Telephone Encounter (Signed)
Rx called in to pharmacy. 

## 2017-08-22 ENCOUNTER — Other Ambulatory Visit: Payer: Self-pay | Admitting: Family Medicine

## 2017-08-22 DIAGNOSIS — S149XXA Injury of unspecified nerves of neck, initial encounter: Secondary | ICD-10-CM

## 2017-08-22 MED ORDER — HYDROCODONE-ACETAMINOPHEN 7.5-325 MG PO TABS: 1.0000 | ORAL_TABLET | Freq: Four times a day (QID) | ORAL | 0 refills | Status: DC | PRN

## 2017-08-22 NOTE — Telephone Encounter (Signed)
Pt contacted office for refill request on the following medications:  HYDROcodone-acetaminophen (NORCO) 7.5-325 MG tablet   [phone]

## 2017-08-29 ENCOUNTER — Other Ambulatory Visit: Payer: Self-pay | Admitting: Family Medicine

## 2017-08-29 DIAGNOSIS — S149XXA Injury of unspecified nerves of neck, initial encounter: Secondary | ICD-10-CM

## 2017-08-29 MED ORDER — HYDROCODONE-ACETAMINOPHEN 7.5-325 MG PO TABS: 1.0000 | ORAL_TABLET | Freq: Four times a day (QID) | ORAL | 0 refills | Status: DC | PRN

## 2017-08-29 NOTE — Telephone Encounter (Signed)
Pt contacted office for refill request on the following medications:  HYDROcodone-acetaminophen ([place]) 7.5-325 MG tablet  Last Rx: 08/22/17 LOV: 05/10/17 Please advise.Thanks TNP

## 2017-08-30 ENCOUNTER — Other Ambulatory Visit: Payer: Self-pay | Admitting: Family Medicine

## 2017-09-02 ENCOUNTER — Other Ambulatory Visit: Payer: Self-pay | Admitting: Family Medicine

## 2017-09-02 NOTE — Telephone Encounter (Signed)
Medication required PA, which was approved. Pt was advised. Reference number# [id number]. Approval dates 09/02/17-09/02/18.

## 2017-09-02 NOTE — Telephone Encounter (Signed)
Pt states insurance is not wanting to pay for pain medication (hydrocodone 7.5-325) at all.  She wants to know what she should do.  Pt states she is totally out of medication.

## 2017-09-08 ENCOUNTER — Other Ambulatory Visit: Payer: Self-pay | Admitting: Family Medicine

## 2017-09-08 DIAGNOSIS — S149XXA Injury of unspecified nerves of neck, initial encounter: Secondary | ICD-10-CM

## 2017-09-08 NOTE — Telephone Encounter (Signed)
Pt contacted office for refill request on the following medications:  HYDROcodone-acetaminophen (NORCO) 7.5-325 MG tablet  EE#[phone]/ZL

## 2017-09-09 MED ORDER — HYDROCODONE-ACETAMINOPHEN 7.5-325 MG PO TABS: 1.0000 | ORAL_TABLET | Freq: Four times a day (QID) | ORAL | 0 refills | Status: DC | PRN

## 2017-09-19 ENCOUNTER — Other Ambulatory Visit: Payer: Self-pay | Admitting: Family Medicine

## 2017-09-19 DIAGNOSIS — S149XXA Injury of unspecified nerves of neck, initial encounter: Secondary | ICD-10-CM

## 2017-09-19 MED ORDER — HYDROCODONE-ACETAMINOPHEN 7.5-325 MG PO TABS: 1.0000 | ORAL_TABLET | Freq: Four times a day (QID) | ORAL | 0 refills | Status: DC | PRN

## 2017-09-19 NOTE — Telephone Encounter (Signed)
Please advise patient that hydrocodone/apap  prescription has been sent electronically to   [id number] [place] [place] [place] [address] Phone: [phone] Fax: [phone]

## 2017-09-19 NOTE — Telephone Encounter (Signed)
Pt contacted office for refill request on the following medications:  HYDROcodone-acetaminophen ([place]) 7.5-325 MG tablet  [facility].  BX#[phone]

## 2017-09-26 ENCOUNTER — Other Ambulatory Visit: Payer: Self-pay | Admitting: Family Medicine

## 2017-09-26 DIAGNOSIS — S149XXA Injury of unspecified nerves of neck, initial encounter: Secondary | ICD-10-CM

## 2017-09-27 MED ORDER — HYDROCODONE-ACETAMINOPHEN 7.5-325 MG PO TABS: 1.0000 | ORAL_TABLET | Freq: Four times a day (QID) | ORAL | 0 refills | Status: DC | PRN

## 2017-10-05 ENCOUNTER — Other Ambulatory Visit: Payer: Self-pay | Admitting: Family Medicine

## 2017-10-05 DIAGNOSIS — S149XXA Injury of unspecified nerves of neck, initial encounter: Secondary | ICD-10-CM

## 2017-10-05 MED ORDER — HYDROCODONE-ACETAMINOPHEN 7.5-325 MG PO TABS: 1.0000 | ORAL_TABLET | Freq: Four times a day (QID) | ORAL | 0 refills | Status: DC | PRN

## 2017-10-05 NOTE — Telephone Encounter (Signed)
Pt contacted office for refill request on the following medications:  HYDROcodone-acetaminophen ([place]) 7.5-325 MG tablet  [facility].  FG#[phone]/CV

## 2017-10-11 ENCOUNTER — Encounter: Payer: Self-pay | Admitting: Physician Assistant

## 2017-10-11 ENCOUNTER — Ambulatory Visit (INDEPENDENT_AMBULATORY_CARE_PROVIDER_SITE_OTHER): Payer: BLUE CROSS/BLUE SHIELD | Admitting: Physician Assistant

## 2017-10-11 VITALS — BP 152/88 | HR 74 | Temp 97.8°F | Resp 16 | Wt 183.0 lb

## 2017-10-11 DIAGNOSIS — J441 Chronic obstructive pulmonary disease with (acute) exacerbation: Secondary | ICD-10-CM | POA: Diagnosis not present

## 2017-10-11 DIAGNOSIS — Z1211 Encounter for screening for malignant neoplasm of colon: Secondary | ICD-10-CM

## 2017-10-11 DIAGNOSIS — F4321 Adjustment disorder with depressed mood: Secondary | ICD-10-CM

## 2017-10-11 MED ORDER — PREDNISONE 20 MG PO TABS: 20.0000 mg | ORAL_TABLET | Freq: Two times a day (BID) | ORAL | 0 refills | Status: AC

## 2017-10-11 MED ORDER — ALBUTEROL SULFATE HFA 108 (90 BASE) MCG/ACT IN AERS: 2.0000 | INHALATION_SPRAY | Freq: Four times a day (QID) | RESPIRATORY_TRACT | 2 refills | Status: DC | PRN

## 2017-10-11 MED ORDER — DOXYCYCLINE HYCLATE 100 MG PO TABS: 100.0000 mg | ORAL_TABLET | Freq: Two times a day (BID) | ORAL | 0 refills | Status: AC

## 2017-10-11 NOTE — Progress Notes (Signed)
[place]  Chief Complaint  Patient presents with  . URI    Started about a week ago    Subjective:    Patient ID: Jamie Williams, female    DOB: 03-22-1961, 56 y.o.   MRN: [id number]  Upper Respiratory Infection: Jamie Williams is a 56 y.o. female with a past medical history significant for COPD, current tobacco abuse complaining of symptoms of a URI. Symptoms include congestion and cough. Onset of symptoms was 1 week ago, gradually worsening since that time. She also c/o congestion, cough described as productive, nasal congestion and wheezing for the past 2 days . She endorses purulent sputum and SOB above baseline  She is drinking plenty of fluids. Evaluation to date: none. Treatment to date: cough suppressants. The treatment has provided minimal relief.   She reports she is going through a difficult time right now. Her brother has just been diagnosed with Stage IV stomach cancer.   Additionally, she has never been screened for colon cancer and is open for referral for colonoscopy today.  Review of Systems  Constitutional: Positive for appetite change and fatigue. Negative for activity change, chills, diaphoresis, fever and unexpected weight change.  HENT: Positive for congestion, postnasal drip and sneezing. Negative for ear discharge, ear pain, facial swelling, nosebleeds, rhinorrhea, sinus pressure, sinus pain, sore throat, tinnitus, trouble swallowing and voice change.   Eyes: Negative.   Respiratory: Positive for cough, chest tightness, shortness of breath and wheezing. Negative for apnea, choking and stridor.   Gastrointestinal: Negative.   Neurological: Negative for dizziness, light-headedness and headaches.       Objective:   BP (!) 152/88 (BP Location: Left Arm, Patient Position: Sitting, Cuff Size: Normal)   Pulse 74   Temp 97.8 F (36.6 C) (Oral)   Resp 16   Wt 183 lb (83 kg)   SpO2 98%   BMI 30.45 kg/m   Patient  Active Problem List   Diagnosis Date Noted  . Menopausal symptoms 01/10/2017  . Cervical spondylosis with radiculopathy 12/24/2015  . Compression injury of cervical spinal nerve 10/07/2015  . Left arm pain 08/29/2015  . Neck pain 08/29/2015  . Hematuria 07/03/2015  . Allergic arthritis 06/27/2015  . Arthritis 06/27/2015  . Back ache 06/27/2015  . Clinical depression 06/27/2015  . H/O abnormal cervical Papanicolaou smear 06/27/2015  . Elevated intracranial pressure 06/27/2015  . Kidney stones 06/27/2015  . Knee pain 06/27/2015  . Abnormal mammogram 06/27/2015  . Headache, migraine 06/27/2015  . Neuropathy 06/27/2015  . Hand paresthesia 06/27/2015  . Pre-diabetes 06/27/2015  . Vitamin D deficiency 06/27/2015  . Weight loss 06/27/2015  . Adjustment reaction 06/27/2015  . COPD (chronic obstructive pulmonary disease) ([place]) 02/24/2011  . Hypercholesterolemia without hypertriglyceridemia 01/29/2010  . Tobacco abuse 01/28/2010  . Benign essential HTN 09/16/2009  . Insomnia 09/16/2009  . Lesion of ulnar nerve 09/16/2009    Outpatient Encounter Medications as of 10/11/2017  Medication Sig  . amLODipine (NORVASC) 10 MG tablet TAKE 1 TABLET BY MOUTH ONCE A DAY  . buPROPion (WELLBUTRIN SR) 150 MG 12 hr tablet TAKE 1 TABLET BY MOUTH TWICE A DAY  . clonazePAM (KLONOPIN) 0.5 MG tablet Take 1 tablet by mouth 2 (two) times daily as needed for anxiety.   . cyclobenzaprine (FLEXERIL) 5 MG tablet Take 1-2 tablets (5-10 mg total) by mouth 3 (three) times daily as needed for muscle spasms.  Marland Kitchen estradiol (ESTRACE) 1 MG tablet TAKE 1 TABLET (1 MG TOTAL) BY  MOUTH DAILY.  Marland Kitchen gabapentin (NEURONTIN) 300 MG capsule Take 1 capsule (300 mg total) by mouth 3 (three) times daily.  Marland Kitchen HYDROcodone-acetaminophen (NORCO) 7.5-325 MG tablet Take 1 tablet by mouth every 6 (six) hours as needed for moderate pain.  . meloxicam (MOBIC) 15 MG tablet TAKE 1 TABLET BY MOUTH DAILY WITH FOOD  . PARoxetine (PAXIL) 40 MG tablet  Take 1 tablet (40 mg total) by mouth daily.  . promethazine (PHENERGAN) 25 MG tablet TAKE 1 TABLET EVERY 4 TO 6 HOURS AS NEEDED FOR NAUSEA  . ranitidine (ZANTAC) 150 MG tablet RANITIDINE HCL, 150MG  (Oral Tablet)  1 Two Times A Day for heartburn, indigestion, nausea for 0 days  Quantity: 60.00;  Refills: 5   Ordered :22-Jul-2010  Reginia Forts MD;  Buddy Duty 28-Jan-2010 Active Comments: DX: 787.02  . simvastatin (ZOCOR) 40 MG tablet Take 1 tablet (40 mg total) by mouth daily.  . traMADol (ULTRAM) 50 MG tablet TAKE 1 TABLET BY MOUTH EVERY 8 HOURS AS NEEDED  . Vitamin D, Ergocalciferol, (DRISDOL) 50000 UNITS CAPS capsule Take 50,000 Units by mouth once a week.    No facility-administered encounter medications on file as of 10/11/2017.     Allergies  Allergen Reactions  . Aspirin     nausea with vomiting.  . Sulfa Antibiotics     hives.  . Penicillins Rash       Physical Exam  Constitutional: She is oriented to person, place, and time. She appears well-developed and well-nourished.  HENT:  Right Ear: Tympanic membrane and external ear normal.  Left Ear: Tympanic membrane and external ear normal.  Mouth/Throat: No oropharyngeal exudate.  Eyes: Conjunctivae are normal.  Neck: Neck supple.  Cardiovascular: Normal rate and regular rhythm.  Pulmonary/Chest: Effort normal. She has wheezes. She has no rales.  Scattered wheezing and rhonchi bilaterally.   Lymphadenopathy:    She has no cervical adenopathy.  Neurological: She is alert and oriented to person, place, and time.  Skin: Skin is warm and dry.  Psychiatric: She has a normal mood and affect. Her behavior is normal.       Assessment & Plan:  1. COPD exacerbation (HCC)  - doxycycline (VIBRA-TABS) 100 MG tablet; Take 1 tablet (100 mg total) by mouth 2 (two) times daily for 7 days.  Dispense: 14 tablet; Refill: 0 - albuterol (PROVENTIL HFA;VENTOLIN HFA) 108 (90 Base) MCG/ACT inhaler; Inhale 2 puffs into the lungs every 6 (six)  hours as needed for wheezing or shortness of breath.  Dispense: 1 Inhaler; Refill: 2 - predniSONE (DELTASONE) 20 MG tablet; Take 1 tablet (20 mg total) by mouth 2 (two) times daily with a meal for 5 days.  Dispense: 10 tablet; Refill: 0  2. Colon cancer screening  Discussed colon cancer screening guidelines.   - Ambulatory referral to Gastroenterology  3. Grief  - Ambulatory referral to Connected Care   The entirety of the information documented in the History of Present Illness, Review of Systems and Physical Exam were personally obtained by me. Portions of this information were initially documented by Ashley Royalty, CMA and reviewed by me for thoroughness and accuracy.

## 2017-10-11 NOTE — Patient Instructions (Signed)

## 2017-10-12 ENCOUNTER — Other Ambulatory Visit: Payer: Self-pay | Admitting: Family Medicine

## 2017-10-12 DIAGNOSIS — S149XXA Injury of unspecified nerves of neck, initial encounter: Secondary | ICD-10-CM

## 2017-10-12 MED ORDER — HYDROCODONE-ACETAMINOPHEN 7.5-325 MG PO TABS: 1.0000 | ORAL_TABLET | Freq: Four times a day (QID) | ORAL | 0 refills | Status: DC | PRN

## 2017-10-12 NOTE — Telephone Encounter (Signed)
Pt contacted office for refill request on the following medications:  HYDROcodone-acetaminophen ([place]) 7.5-325 MG tablet  Last Rx: 10/05/17  [facility]  Please advise. Thanks TNP

## 2017-10-16 ENCOUNTER — Other Ambulatory Visit: Payer: Self-pay | Admitting: Family Medicine

## 2017-10-19 ENCOUNTER — Other Ambulatory Visit: Payer: Self-pay | Admitting: Family Medicine

## 2017-10-19 DIAGNOSIS — S149XXA Injury of unspecified nerves of neck, initial encounter: Secondary | ICD-10-CM

## 2017-10-19 NOTE — Telephone Encounter (Signed)
Pt states she would like a [facility] [place].  Pt is out of medication.

## 2017-10-20 MED ORDER — HYDROCODONE-ACETAMINOPHEN 7.5-325 MG PO TABS: 1.0000 | ORAL_TABLET | Freq: Four times a day (QID) | ORAL | 0 refills | Status: DC | PRN

## 2017-10-26 ENCOUNTER — Other Ambulatory Visit: Payer: Self-pay | Admitting: Family Medicine

## 2017-10-26 DIAGNOSIS — S149XXA Injury of unspecified nerves of neck, initial encounter: Secondary | ICD-10-CM

## 2017-10-26 NOTE — Telephone Encounter (Signed)
Needs refill on Hydrocodone please

## 2017-10-27 ENCOUNTER — Telehealth: Payer: Self-pay | Admitting: Family Medicine

## 2017-10-27 MED ORDER — HYDROCODONE-ACETAMINOPHEN 7.5-325 MG PO TABS: 1.0000 | ORAL_TABLET | Freq: Four times a day (QID) | ORAL | 0 refills | Status: DC | PRN

## 2017-11-01 NOTE — Telephone Encounter (Signed)
Patient called back and states that she does not want to schedule appointment for [facility] and would like to be taken off of the list.

## 2017-11-02 ENCOUNTER — Other Ambulatory Visit: Payer: Self-pay | Admitting: Family Medicine

## 2017-11-02 DIAGNOSIS — S149XXA Injury of unspecified nerves of neck, initial encounter: Secondary | ICD-10-CM

## 2017-11-02 NOTE — Telephone Encounter (Signed)
Sorry I missed the message this am when I reviewed my list.  I will mark her off.  Thanks!

## 2017-11-02 NOTE — Telephone Encounter (Signed)
Patient called and states that she got a call again today about scheduling a [place].  I sent you a message yesterday letting you know that she does not want to do this and she would like her name taken off of you list.  Please take her off of your list.

## 2017-11-02 NOTE — Telephone Encounter (Signed)
Patient is requesting a refill for   HYDROcodone-acetaminophen ([employer]) 7.5-325 MG tablet   She uses [facility]

## 2017-11-03 MED ORDER — HYDROCODONE-ACETAMINOPHEN 7.5-325 MG PO TABS: 1.0000 | ORAL_TABLET | Freq: Four times a day (QID) | ORAL | 0 refills | Status: DC | PRN

## 2017-11-09 ENCOUNTER — Other Ambulatory Visit: Payer: Self-pay | Admitting: Family Medicine

## 2017-11-09 DIAGNOSIS — S149XXA Injury of unspecified nerves of neck, initial encounter: Secondary | ICD-10-CM

## 2017-11-09 NOTE — Telephone Encounter (Signed)
Pt contacted office for refill request on the following medications:  HYDROcodone-acetaminophen ([place]) 7.5-325 MG tablet   [facility] [address]  Last Rx: 11/03/17 LOV: 10/11/17  Please advise. Thanks TNP

## 2017-11-10 NOTE — Telephone Encounter (Signed)
Patient is overdue for follow up for medications. Needs to schedule before refill can be approved.

## 2017-11-14 ENCOUNTER — Ambulatory Visit (INDEPENDENT_AMBULATORY_CARE_PROVIDER_SITE_OTHER): Payer: BLUE CROSS/BLUE SHIELD | Admitting: Family Medicine

## 2017-11-14 ENCOUNTER — Encounter: Payer: Self-pay | Admitting: Family Medicine

## 2017-11-14 VITALS — BP 138/86 | HR 80 | Temp 98.6°F | Resp 16 | Wt 186.0 lb

## 2017-11-14 DIAGNOSIS — G588 Other specified mononeuropathies: Secondary | ICD-10-CM

## 2017-11-14 DIAGNOSIS — F32A Depression, unspecified: Secondary | ICD-10-CM

## 2017-11-14 DIAGNOSIS — E7800 Pure hypercholesterolemia, unspecified: Secondary | ICD-10-CM | POA: Diagnosis not present

## 2017-11-14 DIAGNOSIS — R7303 Prediabetes: Secondary | ICD-10-CM

## 2017-11-14 DIAGNOSIS — Z0283 Encounter for blood-alcohol and blood-drug test: Secondary | ICD-10-CM | POA: Diagnosis not present

## 2017-11-14 DIAGNOSIS — F329 Major depressive disorder, single episode, unspecified: Secondary | ICD-10-CM

## 2017-11-14 DIAGNOSIS — E559 Vitamin D deficiency, unspecified: Secondary | ICD-10-CM

## 2017-11-14 DIAGNOSIS — Z23 Encounter for immunization: Secondary | ICD-10-CM

## 2017-11-14 DIAGNOSIS — I10 Essential (primary) hypertension: Secondary | ICD-10-CM | POA: Diagnosis not present

## 2017-11-14 DIAGNOSIS — S149XXA Injury of unspecified nerves of neck, initial encounter: Secondary | ICD-10-CM

## 2017-11-14 MED ORDER — PAROXETINE HCL 30 MG PO TABS: 60.0000 mg | ORAL_TABLET | Freq: Every day | ORAL | 2 refills | Status: DC

## 2017-11-14 MED ORDER — HYDROCODONE-ACETAMINOPHEN 7.5-325 MG PO TABS: 1.0000 | ORAL_TABLET | Freq: Four times a day (QID) | ORAL | 0 refills | Status: DC | PRN

## 2017-11-14 NOTE — Progress Notes (Signed)
Patient: Jamie Williams Female    DOB: [birth year]   57 y.o.   MRN: [id number] Visit Date: 11/14/2017  Today's Provider: Lelon Huh, MD   Chief Complaint  Patient presents with  . Hypertension  . Hyperlipidemia  . Depression   Subjective:    Depression         This is a chronic problem.  The problem has been gradually worsening (Pt reports a third sibling with cancer. ) since onset.  Associated symptoms include headaches (History of Migraines.  ).  Associated symptoms include no fatigue, no appetite change, not sad and no suicidal ideas.     The symptoms are aggravated by family issues.  Compliance with treatment is good. Has been much more depressed since she found out her brother has stomach cancer around Thanksgiving time. States he was recently discharged on Hospice. She states she had been off of paroxetine, been over the last few months has been taking every day.   Hypertension, follow-up:  BP Readings from Last 3 Encounters:  11/14/17 138/86  10/11/17 (!) 152/88  05/10/17 134/84    She was last seen for hypertension 10 months ago.  BP at that visit was 130/88. Management since that visit includes; no changes.She reports excellent compliance with treatment. She is not having side effects.  She is exercising. She is adherent to low salt diet.   Outside blood pressures are around 130's/80's. She is experiencing none.  Patient denies chest pain, chest pressure/discomfort, fatigue, lower extremity edema and palpitations.   Cardiovascular risk factors include dyslipidemia and hypertension.  Use of agents associated with hypertension: none.   ------------------------------------------------------------------------    Lipid/Cholesterol, Follow-up:   Last seen for this 10 months ago.  Management since that visit includes; labs checked, no changes.  Last Lipid Panel:    Component Value Date/Time   CHOL 213 (H) 01/10/2017 1143   TRIG 95 01/10/2017 1143   HDL 52 01/10/2017 1143   CHOLHDL 4.1 01/10/2017 1143   LDLCALC 142 (H) 01/10/2017 1143    She reports excellent compliance with treatment. She is not having side effects.   Wt Readings from Last 3 Encounters:  11/14/17 186 lb (84.4 kg)  10/11/17 183 lb (83 kg)  05/10/17 178 lb (80.7 kg)    ------------------------------------------------------------------------    Chronic neck pain with radiculopathy From 01/10/2017, continues to have daily pain. States current pain medications have been effective. Is usually taking four every day.   Vitamin D deficiency From 04/12/2017-labs checked, no changes.  Lab Results  Component Value Date   VD25OH 23.8 (L) 04/12/2017      Allergies  Allergen Reactions  . Aspirin     nausea with vomiting.  . Sulfa Antibiotics     hives.  . Penicillins Rash     Current Outpatient Medications:  .  albuterol (PROVENTIL HFA;VENTOLIN HFA) 108 (90 Base) MCG/ACT inhaler, Inhale 2 puffs into the lungs every 6 (six) hours as needed for wheezing or shortness of breath., Disp: 1 Inhaler, Rfl: 2 .  amLODipine (NORVASC) 10 MG tablet, TAKE 1 TABLET BY MOUTH ONCE A DAY, Disp: 30 tablet, Rfl: 5 .  buPROPion (WELLBUTRIN SR) 150 MG 12 hr tablet, TAKE 1 TABLET BY MOUTH TWICE A DAY, Disp: 60 tablet, Rfl: 5 .  clonazePAM (KLONOPIN) 0.5 MG tablet, Take 1 tablet by mouth 2 (two) times daily as needed for anxiety. , Disp: , Rfl:  .  cyclobenzaprine (FLEXERIL) 5 MG tablet, Take 1-2 tablets (5-10  mg total) by mouth 3 (three) times daily as needed for muscle spasms., Disp: 30 tablet, Rfl: 1 .  estradiol (ESTRACE) 1 MG tablet, TAKE 1 TABLET (1 MG TOTAL) BY MOUTH DAILY., Disp: 30 tablet, Rfl: 5 .  gabapentin (NEURONTIN) 300 MG capsule, Take 1 capsule (300 mg total) by mouth 3 (three) times daily., Disp: 90 capsule, Rfl: 3 .  HYDROcodone-acetaminophen (NORCO) 7.5-325 MG tablet, Take 1 tablet by mouth every 6 (six) hours as needed for moderate pain., Disp: 30 tablet, Rfl: 0 .   meloxicam (MOBIC) 15 MG tablet, TAKE 1 TABLET BY MOUTH DAILY WITH FOOD, Disp: 30 tablet, Rfl: 5 .  PARoxetine (PAXIL) 40 MG tablet, Take 1 tablet (40 mg total) by mouth daily., Disp: 30 tablet, Rfl: 5 .  promethazine (PHENERGAN) 25 MG tablet, TAKE 1 TABLET EVERY 4 TO 6 HOURS AS NEEDED FOR NAUSEA, Disp: 30 tablet, Rfl: 5 .  ranitidine (ZANTAC) 150 MG tablet, RANITIDINE HCL, 150MG  (Oral Tablet)  1 Two Times A Day for heartburn, indigestion, nausea for 0 days  Quantity: 60.00;  Refills: 5   Ordered :22-Jul-2010  Reginia Forts MD;  Buddy Duty 28-Jan-2010 Active Comments: DX: 787.02, Disp: , Rfl:  .  simvastatin (ZOCOR) 40 MG tablet, Take 1 tablet (40 mg total) by mouth daily., Disp: 30 tablet, Rfl: 6 .  traMADol (ULTRAM) 50 MG tablet, TAKE 1 TABLET BY MOUTH EVERY 8 HOURS AS NEEDED, Disp: 30 tablet, Rfl: 4 .  Vitamin D, Ergocalciferol, (DRISDOL) 50000 UNITS CAPS capsule, Take 50,000 Units by mouth once a week. , Disp: , Rfl:   Review of Systems  Constitutional: Negative.  Negative for appetite change, chills, fatigue and fever.  Respiratory: Positive for cough. Negative for apnea, choking, chest tightness, shortness of breath, wheezing and stridor.   Cardiovascular: Negative.  Negative for chest pain and palpitations.  Gastrointestinal: Negative.  Negative for abdominal pain, nausea and vomiting.  Neurological: Positive for headaches (History of Migraines.  ). Negative for dizziness, weakness and light-headedness.  Psychiatric/Behavioral: Positive for depression. Negative for suicidal ideas.    Social History   Tobacco Use  . Smoking status: Current Every Day Smoker    Packs/day: 0.50  . Smokeless tobacco: Never Used  . Tobacco comment: Started about 57 years old usually 1/2 ppd  Substance Use Topics  . Alcohol use: Yes    Alcohol/week: 0.0 oz    Comment: rarely   Objective:   BP 138/86 (BP Location: Right Arm, Patient Position: Sitting, Cuff Size: Large)   Pulse 80   Temp 98.6 F (37 C)  (Oral)   Resp 16   Wt 186 lb (84.4 kg)   BMI 30.95 kg/m  Vitals:   11/14/17 1040  BP: 138/86  Pulse: 80  Resp: 16  Temp: 98.6 F (37 C)  TempSrc: Oral  Weight: 186 lb (84.4 kg)     Physical Exam   General Appearance:    Alert, cooperative, frequently breaking down in tears during interview today.   Eyes:    PERRL, conjunctiva/corneas clear, EOM's intact       Lungs:     Clear to auscultation bilaterally, respirations unlabored  Heart:    Regular rate and rhythm  Neurologic:   Awake, alert, oriented x 3. No apparent focal neurological           defect.           Assessment & Plan:      1. Compression injury of cervical spinal nerve Pain well controlled. Is  due for refill. Is taking 4 tablets daily on consistent basis. Consider 30 day supply if drug screen is consistentl.  - HYDROcodone-acetaminophen (NORCO) 7.5-325 MG tablet; Take 1 tablet by mouth every 6 (six) hours as needed for moderate pain.  Dispense: 30 tablet; Refill: 0 - Pain Mgt Scrn (14 Drugs), Ur  2. Benign essential HTN Fairly well controlled. Continue current medications.   - Lipid panel - Comprehensive metabolic panel  3. Vitamin D deficiency  - VITAMIN D 25 Hydroxy (Vit-D Deficiency, Fractures)  4. Pre-diabetes  - Hemoglobin A1c  5. Hypercholesterolemia without hypertriglyceridemia She is tolerating simvastatin well with no adverse effects.   - Lipid panel - Comprehensive metabolic panel  6. Flu vaccine need  - Flu Vaccine QUAD 6+ mos PF IM (Fluarix Quad PF)  7. Encounter for drug screening  - Pain Mgt Scrn (14 Drugs), Ur  8. Depression, unspecified depression type She states she is taking 40mg  tablets consistently. Will increase to 60mg  daily.  - PARoxetine (PAXIL) 30 MG tablet; Take 2 tablets (60 mg total) by mouth daily.  Dispense: 60 tablet; Refill: 2       Lelon Huh, MD  [facility]

## 2017-11-15 LAB — COMPREHENSIVE METABOLIC PANEL
ALBUMIN: 4.7 g/dL (ref 3.5–5.5)
ALK PHOS: 152 IU/L — AB (ref 39–117)
ALT: 11 IU/L (ref 0–32)
AST: 15 IU/L (ref 0–40)
Albumin/Globulin Ratio: 1.9 (ref 1.2–2.2)
BILIRUBIN TOTAL: 0.3 mg/dL (ref 0.0–1.2)
BUN / CREAT RATIO: 10 (ref 9–23)
BUN: 9 mg/dL (ref 6–24)
CHLORIDE: 106 mmol/L (ref 96–106)
CO2: 23 mmol/L (ref 20–29)
CREATININE: 0.91 mg/dL (ref 0.57–1.00)
Calcium: 10.3 mg/dL — ABNORMAL HIGH (ref 8.7–10.2)
GFR calc Af Amer: 82 mL/min/{1.73_m2} (ref >59)
GFR calc non Af Amer: 71 mL/min/{1.73_m2} (ref >59)
GLUCOSE: 122 mg/dL — AB (ref 65–99)
Globulin, Total: 2.5 g/dL (ref 1.5–4.5)
Potassium: 4.6 mmol/L (ref 3.5–5.2)
Sodium: 143 mmol/L (ref 134–144)
Total Protein: 7.2 g/dL (ref 6.0–8.5)

## 2017-11-15 LAB — PAIN MGT SCRN (14 DRUGS), UR
AMPHETAMINE SCREEN URINE: NEGATIVE ng/mL
BARBITURATE SCREEN URINE: NEGATIVE ng/mL
BENZODIAZEPINE SCREEN, URINE: NEGATIVE ng/mL
Buprenorphine, Urine: NEGATIVE ng/mL
CANNABINOIDS UR QL SCN: POSITIVE ng/mL — AB
COCAINE(METAB.)SCREEN, URINE: NEGATIVE ng/mL
Creatinine(Crt), U: 47.1 mg/dL (ref 20.0–300.0)
Fentanyl, Urine: NEGATIVE pg/mL
MEPERIDINE SCREEN, URINE: NEGATIVE ng/mL
Methadone Screen, Urine: NEGATIVE ng/mL
OPIATE SCREEN URINE: NEGATIVE ng/mL
OXYCODONE+OXYMORPHONE UR QL SCN: NEGATIVE ng/mL
PROPOXYPHENE SCREEN URINE: NEGATIVE ng/mL
Ph of Urine: 6.2 (ref 4.5–8.9)
Phencyclidine Qn, Ur: NEGATIVE ng/mL
Tramadol Screen, Urine: NEGATIVE ng/mL

## 2017-11-15 LAB — LIPID PANEL
CHOLESTEROL TOTAL: 253 mg/dL — AB (ref 100–199)
Chol/HDL Ratio: 4.8 ratio — ABNORMAL HIGH (ref 0.0–4.4)
HDL: 53 mg/dL (ref >39)
LDL CALC: 165 mg/dL — AB (ref 0–99)
Triglycerides: 174 mg/dL — ABNORMAL HIGH (ref 0–149)
VLDL Cholesterol Cal: 35 mg/dL (ref 5–40)

## 2017-11-15 LAB — HEMOGLOBIN A1C
ESTIMATED AVERAGE GLUCOSE: 123 mg/dL
HEMOGLOBIN A1C: 5.9 % — AB (ref 4.8–5.6)

## 2017-11-15 LAB — VITAMIN D 25 HYDROXY (VIT D DEFICIENCY, FRACTURES): VIT D 25 HYDROXY: 19.8 ng/mL — AB (ref 30.0–100.0)

## 2017-11-17 NOTE — Telephone Encounter (Signed)
Left message to call back  

## 2017-11-18 NOTE — Telephone Encounter (Signed)
Patient was advised.  

## 2017-11-21 ENCOUNTER — Other Ambulatory Visit: Payer: Self-pay | Admitting: Family Medicine

## 2017-11-21 DIAGNOSIS — S149XXA Injury of unspecified nerves of neck, initial encounter: Secondary | ICD-10-CM

## 2017-11-21 NOTE — Telephone Encounter (Signed)
Pt contacted office for refill request on the following medications:  HYDROcodone-acetaminophen ([place]) 7.5-325 MG tablet   [facility].  Last Rx: 11/14/17  Please advise. Thanks TNP

## 2017-11-23 NOTE — Telephone Encounter (Signed)
Pt called to get a status update on her refill request for HYDROcodone-acetaminophen ([place]CO) 7.5-325 MG tablet. Pt requested it on 11/21/17 to be sent to [place]. Please advise. Thanks TNP

## 2017-11-25 ENCOUNTER — Telehealth: Payer: Self-pay | Admitting: Emergency Medicine

## 2017-11-25 MED ORDER — HYDROCODONE-ACETAMINOPHEN 7.5-325 MG PO TABS: 1.0000 | ORAL_TABLET | Freq: Four times a day (QID) | ORAL | 0 refills | Status: DC | PRN

## 2017-11-25 NOTE — Telephone Encounter (Signed)
Pt advised.

## 2017-11-25 NOTE — Telephone Encounter (Signed)
-----   Message from [name], MD sent at 11/25/2017  8:14 AM EST ----- Drug screen is positive for marijuana. She cannot use marijuana with this medication. Need to stop marijuana and schedule follow up in 6 weeks.

## 2017-11-25 NOTE — Telephone Encounter (Signed)
LMTCB

## 2017-11-28 NOTE — Telephone Encounter (Signed)
Left message advising pt rx was sent to pharmacy.

## 2017-11-30 ENCOUNTER — Encounter: Payer: Self-pay | Admitting: *Deleted

## 2017-12-09 ENCOUNTER — Other Ambulatory Visit: Payer: Self-pay | Admitting: Family Medicine

## 2017-12-09 DIAGNOSIS — S149XXA Injury of unspecified nerves of neck, initial encounter: Secondary | ICD-10-CM

## 2017-12-09 NOTE — Telephone Encounter (Signed)
Patient is requesting a refill on the following medication  HYDROcodone-acetaminophen ([place]) 7.5-325 MG tablet  [facility] [place].

## 2017-12-12 ENCOUNTER — Telehealth: Payer: Self-pay | Admitting: Gastroenterology

## 2017-12-12 MED ORDER — HYDROCODONE-ACETAMINOPHEN 7.5-325 MG PO TABS: 1.0000 | ORAL_TABLET | Freq: Four times a day (QID) | ORAL | 0 refills | Status: DC | PRN

## 2017-12-12 NOTE — Telephone Encounter (Signed)
Patient called to schedule a colonoscopy . °

## 2017-12-13 NOTE — Telephone Encounter (Signed)
LVM for pt to contact office to schedule colonoscopy. 

## 2017-12-19 ENCOUNTER — Other Ambulatory Visit: Payer: Self-pay | Admitting: Family Medicine

## 2017-12-19 DIAGNOSIS — S149XXA Injury of unspecified nerves of neck, initial encounter: Secondary | ICD-10-CM

## 2017-12-19 NOTE — Telephone Encounter (Signed)
Pt is requesting refill on HYDROcodone-acetaminophen ([place]) 7.5-325 MG tablet.She would like this sent to [facility]

## 2017-12-19 NOTE — Telephone Encounter (Signed)
Please review. Thanks!  

## 2017-12-22 MED ORDER — HYDROCODONE-ACETAMINOPHEN 7.5-325 MG PO TABS
1.0000 | ORAL_TABLET | Freq: Four times a day (QID) | ORAL | 0 refills | Status: DC | PRN
Start: 2017-12-22 — End: 2017-12-28

## 2017-12-28 ENCOUNTER — Other Ambulatory Visit: Payer: Self-pay | Admitting: Family Medicine

## 2017-12-28 DIAGNOSIS — S149XXA Injury of unspecified nerves of neck, initial encounter: Secondary | ICD-10-CM

## 2017-12-28 NOTE — Telephone Encounter (Signed)
Pt contacted office for refill request on the following medications:  HYDROcodone-acetaminophen ([place]) 7.5-325 MG tablet  [facility] [address]  Last Rx: 12/22/17 Please advise. Thanks TNP

## 2018-01-03 MED ORDER — HYDROCODONE-ACETAMINOPHEN 7.5-325 MG PO TABS
1.0000 | ORAL_TABLET | Freq: Four times a day (QID) | ORAL | 0 refills | Status: DC | PRN
Start: 2018-01-03 — End: 2018-01-18

## 2018-01-17 ENCOUNTER — Telehealth: Payer: Self-pay | Admitting: Family Medicine

## 2018-01-17 DIAGNOSIS — S149XXA Injury of unspecified nerves of neck, initial encounter: Secondary | ICD-10-CM

## 2018-01-17 NOTE — Telephone Encounter (Signed)
Pt called for refill on her hydrocodone 7.5-325  Pharmacy [facility]  Pts call back is [phone]  Thanks teri

## 2018-01-18 MED ORDER — HYDROCODONE-ACETAMINOPHEN 7.5-325 MG PO TABS: 1.0000 | ORAL_TABLET | Freq: Four times a day (QID) | ORAL | 0 refills | Status: DC | PRN

## 2018-01-31 ENCOUNTER — Other Ambulatory Visit: Payer: Self-pay | Admitting: Family Medicine

## 2018-01-31 DIAGNOSIS — S149XXA Injury of unspecified nerves of neck, initial encounter: Secondary | ICD-10-CM

## 2018-01-31 NOTE — Telephone Encounter (Signed)
Pt needs refill on her hydrocodone 7.5-325 mg  She uses [facility]  Pt's call back is [phone]  Thanks teri

## 2018-02-03 MED ORDER — HYDROCODONE-ACETAMINOPHEN 7.5-325 MG PO TABS: 1.0000 | ORAL_TABLET | Freq: Four times a day (QID) | ORAL | 0 refills | Status: DC | PRN

## 2018-02-17 ENCOUNTER — Other Ambulatory Visit: Payer: Self-pay | Admitting: Family Medicine

## 2018-02-17 DIAGNOSIS — S149XXA Injury of unspecified nerves of neck, initial encounter: Secondary | ICD-10-CM

## 2018-02-17 NOTE — Telephone Encounter (Signed)
Pt requesting refill of Hydrocodone 7.5-325 [facility] to [facility] [place]

## 2018-02-21 MED ORDER — HYDROCODONE-ACETAMINOPHEN 7.5-325 MG PO TABS: 1.0000 | ORAL_TABLET | Freq: Four times a day (QID) | ORAL | 0 refills | Status: DC | PRN

## 2018-02-21 NOTE — Telephone Encounter (Signed)
Please advise patient she is due for follow up of pain medications and needs to schedule in the next couple of weeks. Have sent prescription to get by until her appointment.

## 2018-02-21 NOTE — Telephone Encounter (Signed)
Tried calling pt. No answer or vm. Will try again later.

## 2018-02-22 ENCOUNTER — Emergency Department: Payer: BLUE CROSS/BLUE SHIELD

## 2018-02-22 ENCOUNTER — Emergency Department
Admission: EM | Admit: 2018-02-22 | Discharge: 2018-02-22 | Disposition: A | Discharge status: Home / Self Care | Payer: BLUE CROSS/BLUE SHIELD | Attending: Emergency Medicine | Admitting: Emergency Medicine

## 2018-02-22 ENCOUNTER — Encounter: Payer: Self-pay | Admitting: Emergency Medicine

## 2018-02-22 DIAGNOSIS — I10 Essential (primary) hypertension: Secondary | ICD-10-CM | POA: Insufficient documentation

## 2018-02-22 DIAGNOSIS — R1084 Generalized abdominal pain: Secondary | ICD-10-CM | POA: Diagnosis not present

## 2018-02-22 DIAGNOSIS — Z79899 Other long term (current) drug therapy: Secondary | ICD-10-CM | POA: Diagnosis not present

## 2018-02-22 DIAGNOSIS — R109 Unspecified abdominal pain: Secondary | ICD-10-CM | POA: Diagnosis not present

## 2018-02-22 DIAGNOSIS — M545 Low back pain: Secondary | ICD-10-CM | POA: Diagnosis not present

## 2018-02-22 DIAGNOSIS — J449 Chronic obstructive pulmonary disease, unspecified: Secondary | ICD-10-CM | POA: Insufficient documentation

## 2018-02-22 DIAGNOSIS — N390 Urinary tract infection, site not specified: Secondary | ICD-10-CM | POA: Diagnosis not present

## 2018-02-22 DIAGNOSIS — F17200 Nicotine dependence, unspecified, uncomplicated: Secondary | ICD-10-CM | POA: Diagnosis not present

## 2018-02-22 DIAGNOSIS — K5289 Other specified noninfective gastroenteritis and colitis: Secondary | ICD-10-CM | POA: Insufficient documentation

## 2018-02-22 DIAGNOSIS — K529 Noninfective gastroenteritis and colitis, unspecified: Secondary | ICD-10-CM | POA: Diagnosis not present

## 2018-02-22 DIAGNOSIS — R509 Fever, unspecified: Secondary | ICD-10-CM | POA: Diagnosis not present

## 2018-02-22 LAB — URINALYSIS, COMPLETE (UACMP) WITH MICROSCOPIC
BILIRUBIN URINE: NEGATIVE
Glucose, UA: NEGATIVE mg/dL
KETONES UR: NEGATIVE mg/dL
Nitrite: NEGATIVE
PH: 6 (ref 5.0–8.0)
PROTEIN: NEGATIVE mg/dL
Specific Gravity, Urine: 1.012 (ref 1.005–1.030)

## 2018-02-22 LAB — COMPREHENSIVE METABOLIC PANEL
ALT: 17 U/L (ref 14–54)
ANION GAP: 8 (ref 5–15)
AST: 23 U/L (ref 15–41)
Albumin: 4.1 g/dL (ref 3.5–5.0)
Alkaline Phosphatase: 123 U/L (ref 38–126)
BUN: 13 mg/dL (ref 6–20)
CHLORIDE: 108 mmol/L (ref 101–111)
CO2: 24 mmol/L (ref 22–32)
CREATININE: 0.98 mg/dL (ref 0.44–1.00)
Calcium: 9.3 mg/dL (ref 8.9–10.3)
GFR calc Af Amer: >60 mL/min (ref >60)
Glucose, Bld: 106 mg/dL — ABNORMAL HIGH (ref 65–99)
POTASSIUM: 3.9 mmol/L (ref 3.5–5.1)
SODIUM: 140 mmol/L (ref 135–145)
Total Bilirubin: 1.2 mg/dL (ref 0.3–1.2)
Total Protein: 7.5 g/dL (ref 6.5–8.1)

## 2018-02-22 LAB — CBC
HEMATOCRIT: 41 % (ref 35.0–47.0)
HEMOGLOBIN: 13.8 g/dL (ref 12.0–16.0)
MCH: 28.9 pg (ref 26.0–34.0)
MCHC: 33.7 g/dL (ref 32.0–36.0)
MCV: 85.8 fL (ref 80.0–100.0)
PLATELETS: 303 10*3/uL (ref 150–440)
RBC: 4.78 MIL/uL (ref 3.80–5.20)
RDW: 13.8 % (ref 11.5–14.5)
WBC: 12.4 10*3/uL — AB (ref 3.6–11.0)

## 2018-02-22 LAB — LIPASE, BLOOD: Lipase: 26 U/L (ref 11–51)

## 2018-02-22 MED ORDER — SODIUM CHLORIDE 0.9 % IV SOLN
1000.0000 mL | Freq: Once | INTRAVENOUS | Status: AC
  Administered 2018-02-22: 1000 mL via INTRAVENOUS

## 2018-02-22 MED ORDER — CIPROFLOXACIN HCL 500 MG PO TABS: 500.0000 mg | ORAL_TABLET | Freq: Two times a day (BID) | ORAL | 0 refills | Status: DC

## 2018-02-22 MED ORDER — ONDANSETRON HCL 4 MG/2ML IJ SOLN
4.0000 mg | Freq: Once | INTRAMUSCULAR | Status: AC
  Administered 2018-02-22: 4 mg via INTRAVENOUS
  Filled 2018-02-22: qty 2

## 2018-02-22 MED ORDER — KETOROLAC TROMETHAMINE 30 MG/ML IJ SOLN
30.0000 mg | Freq: Once | INTRAMUSCULAR | Status: AC
  Administered 2018-02-22: 30 mg via INTRAVENOUS
  Filled 2018-02-22: qty 1

## 2018-02-22 MED ORDER — ONDANSETRON 4 MG PO TBDP: 4.0000 mg | ORAL_TABLET | Freq: Three times a day (TID) | ORAL | 0 refills | Status: DC | PRN

## 2018-02-22 NOTE — ED Provider Notes (Signed)
[facility] Emergency Department Provider Note   ____________________________________________    I have reviewed the triage vital signs and the nursing notes.   HISTORY  Chief Complaint Abdominal Pain and Back Pain     HPI Jamie Williams is a 57 y.o. female who presents with complaints of abdominal cramping, aching in her lower back, nausea and diarrhea over the last 2 days.  Subjective fevers.  No sick contacts reported.  Has not taken anything for this.  Foul-smelling urine as well.  No history of kidney infections.  No blood in the stool.  No recent travel.   Past Medical History:  Diagnosis Date  . Anxiety   . Arthritis    hands  . Depression   . Family history of adverse reaction to anesthesia    "sister had a difficult time waking up from mastectomy and passed away"   . GERD (gastroesophageal reflux disease)   . Headache    migraines  . History of chicken pox   . History of kidney stones   . Hypercholesteremia   . Hypertension   . Numbness and tingling in left arm   . PONV (postoperative nausea and vomiting)    nausea  . Urinary frequency     Patient Active Problem List   Diagnosis Date Noted  . Menopausal symptoms 01/10/2017  . Cervical spondylosis with radiculopathy 12/24/2015  . Compression injury of cervical spinal nerve 10/07/2015  . Left arm pain 08/29/2015  . Neck pain 08/29/2015  . Hematuria 07/03/2015  . Allergic arthritis 06/27/2015  . Arthritis 06/27/2015  . Back ache 06/27/2015  . Clinical depression 06/27/2015  . H/O abnormal cervical Papanicolaou smear 06/27/2015  . Elevated intracranial pressure 06/27/2015  . Kidney stones 06/27/2015  . Knee pain 06/27/2015  . Abnormal mammogram 06/27/2015  . Headache, migraine 06/27/2015  . Neuropathy 06/27/2015  . Hand paresthesia 06/27/2015  . Pre-diabetes 06/27/2015  . Vitamin D deficiency 06/27/2015  . Weight loss 06/27/2015  . Adjustment reaction 06/27/2015  .  COPD (chronic obstructive pulmonary disease) ([place]) 02/24/2011  . Hypercholesterolemia without hypertriglyceridemia 01/29/2010  . Tobacco abuse 01/28/2010  . Benign essential HTN 09/16/2009  . Insomnia 09/16/2009  . Lesion of ulnar nerve 09/16/2009    Past Surgical History:  Procedure Laterality Date  . ABDOMINAL HYSTERECTOMY  2011   Abdominal; Ovaries intact; CERVIX INTACT. Fibroids/dys menorrhea. [place]  . ANTERIOR CERVICAL DECOMP/DISCECTOMY FUSION N/A 12/24/2015   Procedure: Cervical five-six, Cervical six-seven  Anterior cervical decompression/diskectomy/fusion/interbody prosthesis/plate;  Surgeon: Newman Pies, MD;  Location: [place] [facility];  Service: Neurosurgery;  Laterality: N/A;  . [place] and 1996   two Etopic preg.  Marland Kitchen Head Injuries  2006   surgery 2006 Cram/NS in [place]. Headaches with increased intracranial pressure. Repeat MRI in 2008 Negative  . Ulnar Neuropathy Right 2004   Ulnar Neuropathy surgical revision. 2004-2008. [place].    Prior to Admission medications   Medication Sig Start Date End Date Taking? Authorizing Provider  albuterol (PROVENTIL HFA;VENTOLIN HFA) 108 (90 Base) MCG/ACT inhaler Inhale 2 puffs into the lungs every 6 (six) hours as needed for wheezing or shortness of breath. 10/11/17   Trinna Post, PA-C  amLODipine (NORVASC) 10 MG tablet TAKE 1 TABLET BY MOUTH ONCE A DAY 04/09/17   [name], MD  buPROPion [facility] SR) 150 MG 12 hr tablet TAKE 1 TABLET BY MOUTH TWICE A DAY 03/15/17   [name], MD  ciprofloxacin (CIPRO) 500  MG tablet Take 1 tablet (500 mg total) by mouth 2 (two) times daily. 02/22/18   Lavonia Drafts, MD  clonazePAM (KLONOPIN) 0.5 MG tablet Take 1 tablet by mouth 2 (two) times daily as needed for anxiety.  01/31/15   [provider]  cyclobenzaprine (FLEXERIL) 5 MG tablet Take 1-2 tablets (5-10 mg total) by mouth 3 (three) times daily as needed for muscle spasms. 05/10/17   [name], MD  estradiol (ESTRACE) 1 MG tablet TAKE 1 TABLET (1 MG TOTAL) BY MOUTH DAILY. 06/15/17   [name], MD  gabapentin (NEURONTIN) 300 MG capsule Take 1 capsule (300 mg total) by mouth 3 (three) times daily. 05/10/17   [name], MD  HYDROcodone-acetaminophen (NORCO) 7.5-325 MG tablet Take 1 tablet by mouth every 6 (six) hours as needed for moderate pain. 02/21/18   [name], MD  meloxicam (MOBIC) 15 MG tablet TAKE 1 TABLET BY MOUTH DAILY WITH FOOD 10/16/17   [name], MD  ondansetron (ZOFRAN ODT) 4 MG disintegrating tablet Take 1 tablet (4 mg total) by mouth every 8 (eight) hours as needed for nausea or vomiting. 02/22/18   Lavonia Drafts, MD  PARoxetine (PAXIL) 30 MG tablet Take 2 tablets (60 mg total) by mouth daily. 11/14/17   [name], MD  promethazine (PHENERGAN) 25 MG tablet TAKE 1 TABLET EVERY 4 TO 6 HOURS AS NEEDED FOR NAUSEA 08/30/17   [name], MD  ranitidine (ZANTAC) 150 MG tablet RANITIDINE HCL, 150MG  (Oral Tablet)  1 Two Times A Day for heartburn, indigestion, nausea for 0 days  Quantity: 60.00;  Refills: 5   Ordered :22-Jul-2010  Reginia Forts MD;  Started 28-Jan-2010 Active Comments: DX: 787.02 01/28/10   [provider]  simvastatin (ZOCOR) 40 MG tablet Take 1 tablet (40 mg total) by mouth daily. 01/27/16   [name], MD  traMADol (ULTRAM) 50 MG tablet TAKE 1 TABLET BY MOUTH EVERY 8 HOURS AS NEEDED 08/18/17   [name], MD  Vitamin D, Ergocalciferol, (DRISDOL) 50000 UNITS CAPS capsule Take 50,000 Units by mouth once a week.  07/04/12   [provider]     Allergies Aspirin; Sulfa antibiotics; and Penicillins  Family History  Problem Relation Age of Onset  . Hypertension Mother   . Diabetes Mother        Type 2  . Breast cancer Mother 58  . Cancer Sister 59       Breast  . Diabetes Sister        type 2  . Breast cancer Sister 52  . Diabetes Sister   . Diabetes Sister   . Diabetes Sister         type 2    Social History Social History   Tobacco Use  . Smoking status: Current Every Day Smoker    Packs/day: 0.50  . Smokeless tobacco: Never Used  . Tobacco comment: Started about 57 years old usually 1/2 ppd  Substance Use Topics  . Alcohol use: Yes    Alcohol/week: 0.0 oz    Comment: rarely  . Drug use: No    Review of Systems  Constitutional: Subjective fever Eyes: No visual changes.  ENT: No sore throat. Cardiovascular: Denies chest pain. Respiratory: Denies shortness of breath. Gastrointestinal: As above Genitourinary: Negative for dysuria.  Foul-smelling urine Musculoskeletal: Low back pain and myalgias Skin: Negative for rash. Neurological: Negative for headaches    ____________________________________________   PHYSICAL EXAM:  VITAL SIGNS: ED  Triage Vitals  Enc Vitals Group     BP 02/22/18 1151 (!) 155/89     Pulse Rate 02/22/18 1151 75     Resp 02/22/18 1151 20     Temp 02/22/18 1151 98.6 F (37 C)     Temp Source 02/22/18 1151 Oral     SpO2 02/22/18 1151 99 %     Weight 02/22/18 1151 83.9 kg (185 lb)     Height 02/22/18 1151 1.676 m (5\' 6" )     Head Circumference --      Peak Flow --      Pain Score 02/22/18 1153 10     Pain Loc --      Pain Edu? --      Excl. in [place]? --     Constitutional: Alert and oriented. No acute distress. Pleasant and interactive Eyes: Conjunctivae are normal.   Nose: No congestion/rhinnorhea. Mouth/Throat: Mucous membranes are moist.    Cardiovascular: Normal rate, regular rhythm. Grossly normal heart sounds.  Good peripheral circulation. Respiratory: Normal respiratory effort.  No retractions. Lungs CTAB. Gastrointestinal: Soft and nontender. No distention.  No CVA tenderness.  Musculoskeletal: Back: Normal exam, no vertebral tenderness, no muscle tenderness erythema or swelling.   Warm and well perfused extremities Neurologic:  Normal speech and language. No gross focal neurologic deficits are appreciated.    Skin:  Skin is warm, dry and intact. No rash noted. Psychiatric: Mood and affect are normal. Speech and behavior are normal.  ____________________________________________   LABS (all labs ordered are listed, but only abnormal results are displayed)  Labs Reviewed  COMPREHENSIVE METABOLIC PANEL - Abnormal; Notable for the following components:      Result Value   Glucose, Bld 106 (*)    All other components within normal limits  CBC - Abnormal; Notable for the following components:   WBC 12.4 (*)    All other components within normal limits  URINALYSIS, COMPLETE (UACMP) WITH MICROSCOPIC - Abnormal; Notable for the following components:   Color, Urine YELLOW (*)    APPearance CLEAR (*)    Hgb urine dipstick SMALL (*)    Leukocytes, UA SMALL (*)    Bacteria, UA RARE (*)    All other components within normal limits  LIPASE, BLOOD   ____________________________________________  EKG   ____________________________________________  RADIOLOGY  CT renal stone study negative for kidney stone ____________________________________________   PROCEDURES  Procedure(s) performed: No  Procedures   Critical Care performed: No ____________________________________________   INITIAL IMPRESSION / ASSESSMENT AND PLAN / ED COURSE  Pertinent labs & imaging results that were available during my care of the patient were reviewed by me and considered in my medical decision making (see chart for details).  Patient overall well-appearing in no acute distress.  Lab work is reassuring, mild elevation in white blood cell count is nonspecific.  Possible early urinary tract infection, given reports of foul-smelling urine will treat as outpatient.  CT scan obtained, treated with IV Toradol and IV Zofran.  CT scan reassuring, no acute abnormalities noted.  Patient felt much better after Toradol and Zofran and fluids, appropriate for discharge at this time, return precautions discussed     ____________________________________________   FINAL CLINICAL IMPRESSION(S) / ED DIAGNOSES  Final diagnoses:  Gastroenteritis  Lower urinary tract infection        Note:  This document was prepared using Dragon voice recognition software and may include unintentional dictation errors.    Lavonia Drafts, MD 02/22/18 1420

## 2018-02-22 NOTE — ED Triage Notes (Signed)
Pt presents to ED via POV with c/o lower back pain since Monday, and lower abdominal pain. Pt states +N/V, states 2 episodes of vomiting at this time. Pt also c/o malodorous urine at this time.

## 2018-02-23 NOTE — Telephone Encounter (Signed)
Appt scheduled for 03/07/2018.

## 2018-03-07 ENCOUNTER — Encounter: Payer: Self-pay | Admitting: Family Medicine

## 2018-03-07 ENCOUNTER — Ambulatory Visit (INDEPENDENT_AMBULATORY_CARE_PROVIDER_SITE_OTHER): Payer: BLUE CROSS/BLUE SHIELD | Admitting: Family Medicine

## 2018-03-07 VITALS — BP 140/86 | HR 75 | Temp 97.8°F | Resp 16 | Wt 177.0 lb

## 2018-03-07 DIAGNOSIS — S149XXA Injury of unspecified nerves of neck, initial encounter: Secondary | ICD-10-CM | POA: Diagnosis not present

## 2018-03-07 DIAGNOSIS — F1190 Opioid use, unspecified, uncomplicated: Secondary | ICD-10-CM

## 2018-03-07 DIAGNOSIS — I10 Essential (primary) hypertension: Secondary | ICD-10-CM

## 2018-03-07 DIAGNOSIS — M4722 Other spondylosis with radiculopathy, cervical region: Secondary | ICD-10-CM | POA: Diagnosis not present

## 2018-03-07 DIAGNOSIS — E7800 Pure hypercholesterolemia, unspecified: Secondary | ICD-10-CM | POA: Diagnosis not present

## 2018-03-07 DIAGNOSIS — E559 Vitamin D deficiency, unspecified: Secondary | ICD-10-CM | POA: Diagnosis not present

## 2018-03-07 DIAGNOSIS — Z1211 Encounter for screening for malignant neoplasm of colon: Secondary | ICD-10-CM | POA: Diagnosis not present

## 2018-03-07 DIAGNOSIS — R7303 Prediabetes: Secondary | ICD-10-CM | POA: Diagnosis not present

## 2018-03-07 MED ORDER — HYDROCODONE-ACETAMINOPHEN 7.5-325 MG PO TABS: 1.0000 | ORAL_TABLET | Freq: Four times a day (QID) | ORAL | 0 refills | Status: DC | PRN

## 2018-03-07 MED ORDER — PROMETHAZINE HCL 25 MG PO TABS: ORAL_TABLET | ORAL | 5 refills | Status: DC

## 2018-03-07 NOTE — Patient Instructions (Signed)
Please call the [facility] ([phone]) to schedule a routine screening mammogram.  

## 2018-03-07 NOTE — Progress Notes (Signed)
Patient: Jamie Williams Female    DOB: 1961-02-28   57 y.o.   MRN: [id number] Visit Date: 03/07/2018  Today's Provider: Lelon Huh, MD   Chief Complaint  Patient presents with  . Hypertension  . Depression  . Pain   Subjective:    HPI   Hypertension, follow-up:  BP Readings from Last 3 Encounters:  02/22/18 (!) 148/78  11/14/17 138/86  10/11/17 (!) 152/88    She was last seen for hypertension 4 months ago.  BP at that visit was 138/86. Management since that visit includes; no changes.She reports good compliance with treatment. She is not having side effects.  She is not exercising. She is adherent to low salt diet.   Outside blood pressures are checked occasionally. She is experiencing none.  Patient denies chest pain, chest pressure/discomfort, claudication, dyspnea, exertional chest pressure/discomfort, fatigue, irregular heart beat, lower extremity edema, near-syncope, orthopnea, palpitations, paroxysmal nocturnal dyspnea, syncope and tachypnea.   Cardiovascular risk factors include hypertension.  Use of agents associated with hypertension: none.   ------------------------------------------------------------------------  Compression injury of cervical spinal nerve From 11/14/2017-UDS done and for marijuana. Patient was advised to stop marijuana use and follow-up in 6 weeks.Patient reports good compliance with treatment, good tolerance and good symptom control. She states she still has daily neck pain and has to take hydrocodone/apap a couple of times most days, but it remains effective. She is greatly imp  Depression, unspecified depression type From 11/14/2017-increased Paxil form 40 mg to 60 mg qd. Patient reports good compliance with treatment, good tolerance and good symptom control.  Follow up lipids She was previously taking simvastatin which she tolerated, but ran out of last year. Lipid Panel     Component Value Date/Time   CHOL 253 (H)  11/14/2017 1136   TRIG 174 (H) 11/14/2017 1136   HDL 53 11/14/2017 1136   CHOLHDL 4.8 (H) 11/14/2017 1136   LDLCALC 165 (H) 11/14/2017 1136  She has been working on improving her diet since lipids checked in January  Follow up vitamin d She was previously on week vitamin D Lab Results  Component Value Date   VD25OH 19.8 (L) 11/14/2017  Since last checked she states she has been taking OTC vitamin d 1,000 units a day and is tolerating well.     Allergies  Allergen Reactions  . Aspirin     nausea with vomiting.  . Sulfa Antibiotics     hives.  . Penicillins Rash     Current Outpatient Medications:  .  albuterol (PROVENTIL HFA;VENTOLIN HFA) 108 (90 Base) MCG/ACT inhaler, Inhale 2 puffs into the lungs every 6 (six) hours as needed for wheezing or shortness of breath., Disp: 1 Inhaler, Rfl: 2 .  amLODipine (NORVASC) 10 MG tablet, TAKE 1 TABLET BY MOUTH ONCE A DAY, Disp: 30 tablet, Rfl: 5 .  buPROPion (WELLBUTRIN SR) 150 MG 12 hr tablet, TAKE 1 TABLET BY MOUTH TWICE A DAY, Disp: 60 tablet, Rfl: 5 .  cholecalciferol (VITAMIN D) 1000 units tablet, Take 1,000 Units by mouth daily., Disp: , Rfl:  .  estradiol (ESTRACE) 1 MG tablet, TAKE 1 TABLET (1 MG TOTAL) BY MOUTH DAILY., Disp: 30 tablet, Rfl: 5 .  gabapentin (NEURONTIN) 300 MG capsule, Take 1 capsule (300 mg total) by mouth 3 (three) times daily., Disp: 90 capsule, Rfl: 3 .  HYDROcodone-acetaminophen (NORCO) 7.5-325 MG tablet, Take 1 tablet by mouth every 6 (six) hours as needed for moderate pain., Disp: 30 tablet, Rfl:  0 .  meloxicam (MOBIC) 15 MG tablet, TAKE 1 TABLET BY MOUTH DAILY WITH FOOD, Disp: 30 tablet, Rfl: 5 .  PARoxetine (PAXIL) 30 MG tablet, Take 2 tablets (60 mg total) by mouth daily., Disp: 60 tablet, Rfl: 2 .  ranitidine (ZANTAC) 150 MG tablet, RANITIDINE HCL, 150MG  (Oral Tablet)  1 Two Times A Day for heartburn, indigestion, nausea for 0 days  Quantity: 60.00;  Refills: 5   Ordered :22-Jul-2010  Reginia Forts MD;   Buddy Duty 28-Jan-2010 Active Comments: DX: 787.02, Disp: , Rfl:  .  simvastatin (ZOCOR) 40 MG tablet, Take 1 tablet (40 mg total) by mouth daily. (Patient not taking: Reported on 03/07/2018), Disp: 30 tablet, Rfl: 6  Promethazine 25mg  every day  Review of Systems  Constitutional: Negative for appetite change, chills, fatigue and fever.  Respiratory: Negative for chest tightness and shortness of breath.   Cardiovascular: Negative for chest pain and palpitations.  Gastrointestinal: Negative for abdominal pain, nausea and vomiting.  Neurological: Negative for dizziness and weakness.    Social History   Tobacco Use  . Smoking status: Current Every Day Smoker    Packs/day: 0.50  . Smokeless tobacco: Never Used  . Tobacco comment: Started about 57 years old usually 1/2 ppd  Substance Use Topics  . Alcohol use: Yes    Alcohol/week: 0.0 oz    Comment: rarely   Objective:   BP 140/86 (BP Location: Left Arm, Patient Position: Sitting, Cuff Size: Large)   Pulse 75   Temp 97.8 F (36.6 C) (Oral)   Resp 16   Wt 177 lb (80.3 kg)   SpO2 98% Comment: room air  BMI 28.57 kg/m     Physical Exam  General appearance: alert, well developed, well nourished, cooperative and in no distress Head: Normocephalic, without obvious abnormality, atraumatic Respiratory: Respirations even and unlabored, normal respiratory rate Extremities: Mild tenderness along cervical spine. FROM with pain at limites of rotation and neck extension. .     Assessment & Plan:     1. Chronic narcotic use  - Pain Mgt Scrn (14 Drugs), Ur  2. Compression injury of cervical spinal nerve Much improved since surgery, but still has some pain which does not limit her activities on current medication regiment.  - HYDROcodone-acetaminophen (NORCO) 7.5-325 MG tablet; Take 1 tablet by mouth every 6 (six) hours as needed for moderate pain.  Dispense: 30 tablet; Refill: 0  3. Cervical spondylosis with radiculopathy   4.  Pre-diabetes  - Hemoglobin A1c  5. Vitamin D deficiency  - VITAMIN D 25 Hydroxy (Vit-D Deficiency, Fractures)  6. Hypercholesterolemia without hypertriglyceridemia Has been out of simvastatin since last year. Will likely need to restart unless there has been significant improvement in lipids - Lipid panel - TSH  7. Colon cancer screening  - Ambulatory referral to Gastroenterology  8. Benign essential HTN Well controlled.  Continue current medications.   - TSH  Patient given contact information to schedule mammogram.   Return for CPE in the fall.       Lelon Huh, MD  [facility]

## 2018-03-08 LAB — LIPID PANEL
CHOL/HDL RATIO: 3.9 ratio (ref 0.0–4.4)
Cholesterol, Total: 195 mg/dL (ref 100–199)
HDL: 50 mg/dL (ref >39)
LDL Calculated: 129 mg/dL — ABNORMAL HIGH (ref 0–99)
TRIGLYCERIDES: 80 mg/dL (ref 0–149)
VLDL Cholesterol Cal: 16 mg/dL (ref 5–40)

## 2018-03-08 LAB — HEMOGLOBIN A1C
ESTIMATED AVERAGE GLUCOSE: 120 mg/dL
Hgb A1c MFr Bld: 5.8 % — ABNORMAL HIGH (ref 4.8–5.6)

## 2018-03-08 LAB — TSH: TSH: 1.49 u[IU]/mL (ref 0.450–4.500)

## 2018-03-08 LAB — VITAMIN D 25 HYDROXY (VIT D DEFICIENCY, FRACTURES): Vit D, 25-Hydroxy: 25.7 ng/mL — ABNORMAL LOW (ref 30.0–100.0)

## 2018-03-10 LAB — PAIN MGT SCRN (14 DRUGS), UR
AMPHETAMINE SCREEN URINE: NEGATIVE ng/mL
BARBITURATE SCREEN URINE: NEGATIVE ng/mL
BENZODIAZEPINE SCREEN, URINE: NEGATIVE ng/mL
Buprenorphine, Urine: NEGATIVE ng/mL
CANNABINOIDS UR QL SCN: POSITIVE ng/mL — AB
COCAINE(METAB.)SCREEN, URINE: NEGATIVE ng/mL
Creatinine(Crt), U: 59.1 mg/dL (ref 20.0–300.0)
Fentanyl, Urine: NEGATIVE pg/mL
Meperidine Screen, Urine: NEGATIVE ng/mL
Methadone Screen, Urine: NEGATIVE ng/mL
OXYCODONE+OXYMORPHONE UR QL SCN: NEGATIVE ng/mL
Opiate Scrn, Ur: NEGATIVE ng/mL
PROPOXYPHENE SCREEN URINE: NEGATIVE ng/mL
Ph of Urine: 6.4 (ref 4.5–8.9)
Phencyclidine Qn, Ur: NEGATIVE ng/mL
Tramadol Screen, Urine: NEGATIVE ng/mL

## 2018-03-17 ENCOUNTER — Other Ambulatory Visit: Payer: Self-pay | Admitting: Family Medicine

## 2018-03-17 DIAGNOSIS — S149XXA Injury of unspecified nerves of neck, initial encounter: Secondary | ICD-10-CM

## 2018-03-17 NOTE — Telephone Encounter (Signed)
Patient needs refill on Hydrocodone 7.5-325 mg.  Sent to [facility]

## 2018-03-19 MED ORDER — HYDROCODONE-ACETAMINOPHEN 7.5-325 MG PO TABS: 1.0000 | ORAL_TABLET | Freq: Four times a day (QID) | ORAL | 0 refills | Status: DC | PRN

## 2018-03-27 ENCOUNTER — Other Ambulatory Visit: Payer: Self-pay | Admitting: Family Medicine

## 2018-03-27 DIAGNOSIS — S149XXA Injury of unspecified nerves of neck, initial encounter: Secondary | ICD-10-CM

## 2018-03-27 NOTE — Telephone Encounter (Signed)
Pt contacted office for refill request on the following medications:  HYDROcodone-acetaminophen ([place]) 7.5-325 MG tablet  [facility] [address]  Last Rx: 03/19/18 LOV: 03/07/18 NOV: 07/11/18 Please advise. Thanks TNP

## 2018-03-30 MED ORDER — HYDROCODONE-ACETAMINOPHEN 7.5-325 MG PO TABS: 1.0000 | ORAL_TABLET | Freq: Four times a day (QID) | ORAL | 0 refills | Status: DC | PRN

## 2018-04-13 ENCOUNTER — Other Ambulatory Visit: Payer: Self-pay | Admitting: Family Medicine

## 2018-04-13 DIAGNOSIS — S149XXA Injury of unspecified nerves of neck, initial encounter: Secondary | ICD-10-CM

## 2018-04-13 NOTE — Telephone Encounter (Signed)
Pt contacted office for refill request on the following medications:  HYDROcodone-acetaminophen ([place]) 7.5-325 MG tablet  [facility]   Last Rx: 03/30/18 LOV: 03/07/18 Please advise. Thanks TNP

## 2018-04-17 ENCOUNTER — Ambulatory Visit (INDEPENDENT_AMBULATORY_CARE_PROVIDER_SITE_OTHER): Payer: BLUE CROSS/BLUE SHIELD | Admitting: Family Medicine

## 2018-04-17 ENCOUNTER — Encounter: Payer: Self-pay | Admitting: Family Medicine

## 2018-04-17 VITALS — BP 130/100 | HR 89 | Temp 98.5°F | Resp 16 | Wt 173.8 lb

## 2018-04-17 DIAGNOSIS — S39012A Strain of muscle, fascia and tendon of lower back, initial encounter: Secondary | ICD-10-CM

## 2018-04-17 MED ORDER — CYCLOBENZAPRINE HCL 5 MG PO TABS: 5.0000 mg | ORAL_TABLET | Freq: Three times a day (TID) | ORAL | 0 refills | Status: DC | PRN

## 2018-04-17 MED ORDER — HYDROCODONE-ACETAMINOPHEN 7.5-325 MG PO TABS: 1.0000 | ORAL_TABLET | Freq: Four times a day (QID) | ORAL | 0 refills | Status: DC | PRN

## 2018-04-17 NOTE — Progress Notes (Signed)
  Subjective:     Patient ID: Jamie Williams, female   DOB: 1961/05/08, 57 y.o.   MRN: [id number] Chief Complaint  Patient presents with  . Back Pain    Patient comes in office today with complaints of mid lower back pain radiating to the left side. Patient states that she has pain when bending or sitting. Patient reports that she takes Gabapentin, Meloxicam, otc advil and hot/cold compress to help with symptoms   HPI States she is not working at the present time and does not recall any specific injury. States pain is worse with sitting and changing positions. Hx of cervical spondylosis. Review of Systems     Objective:   Physical Exam  Constitutional: She appears well-developed and well-nourished. She appears distressed (moderate distress-standing on presentation).  Musculoskeletal:  Muscle strength in lower extremities 5/5. SLR's to 90 degrees without radiation of back pain.       Assessment:    1. Strain of lumbar region, initial encounter - cyclobenzaprine (FLEXERIL) 5 MG tablet; Take 1 tablet (5 mg total) by mouth 3 (three) times daily as needed for muscle spasms.  Dispense: 21 tablet; Refill: 0    Plan:    Discussed rational use of Tylenol with her Vicodin. Return for radicular symptoms.

## 2018-04-17 NOTE — Patient Instructions (Addendum)
You may additional Tylenol but do not exceed 3000 mg/day. Let us know if not improving in the next two weeks or you notice pain,numbness or tingling persistently going down your leg.

## 2018-04-28 ENCOUNTER — Other Ambulatory Visit: Payer: Self-pay | Admitting: Family Medicine

## 2018-04-28 DIAGNOSIS — S149XXA Injury of unspecified nerves of neck, initial encounter: Secondary | ICD-10-CM

## 2018-04-28 NOTE — Telephone Encounter (Signed)
Patient calling requesting a refill on the following medication. Thanks CC  HYDROcodone-acetaminophen (NORCO) 7.5-325 MG tablet

## 2018-04-29 MED ORDER — HYDROCODONE-ACETAMINOPHEN 7.5-325 MG PO TABS: 1.0000 | ORAL_TABLET | Freq: Four times a day (QID) | ORAL | 0 refills | Status: DC | PRN

## 2018-05-09 ENCOUNTER — Other Ambulatory Visit: Payer: Self-pay | Admitting: Family Medicine

## 2018-05-09 DIAGNOSIS — S149XXA Injury of unspecified nerves of neck, initial encounter: Secondary | ICD-10-CM

## 2018-05-09 NOTE — Telephone Encounter (Signed)
pt needs refill on her hydrocodone 7.5-325  [facility] [address]  Thanks teri

## 2018-05-10 MED ORDER — HYDROCODONE-ACETAMINOPHEN 7.5-325 MG PO TABS: 1.0000 | ORAL_TABLET | Freq: Four times a day (QID) | ORAL | 0 refills | Status: DC | PRN

## 2018-05-23 ENCOUNTER — Other Ambulatory Visit: Payer: Self-pay | Admitting: Family Medicine

## 2018-05-23 DIAGNOSIS — S149XXA Injury of unspecified nerves of neck, initial encounter: Secondary | ICD-10-CM

## 2018-05-23 MED ORDER — HYDROCODONE-ACETAMINOPHEN 7.5-325 MG PO TABS: 1.0000 | ORAL_TABLET | Freq: Four times a day (QID) | ORAL | 0 refills | Status: DC | PRN

## 2018-05-23 NOTE — Telephone Encounter (Signed)
Patient is requesting a refill on the following medication  HYDROcodone-acetaminophen (NORCO) 7.5-325 MG tablet  She is completely out of this medication.  She states that she called in on Friday 05/19/2018 to request a refill on this and there was not a message sent back that I see.  [facility] uses [facility].

## 2018-05-31 ENCOUNTER — Other Ambulatory Visit: Payer: Self-pay

## 2018-05-31 DIAGNOSIS — S149XXA Injury of unspecified nerves of neck, initial encounter: Secondary | ICD-10-CM

## 2018-06-01 MED ORDER — HYDROCODONE-ACETAMINOPHEN 7.5-325 MG PO TABS: 1.0000 | ORAL_TABLET | Freq: Four times a day (QID) | ORAL | 0 refills | Status: DC | PRN

## 2018-06-02 ENCOUNTER — Telehealth: Payer: Self-pay

## 2018-06-02 NOTE — Telephone Encounter (Signed)
LMTCB to schedule AWV prior to CPE on 07/11/18. -MM

## 2018-06-12 ENCOUNTER — Other Ambulatory Visit: Payer: Self-pay | Admitting: Family Medicine

## 2018-06-12 DIAGNOSIS — S149XXA Injury of unspecified nerves of neck, initial encounter: Secondary | ICD-10-CM

## 2018-06-12 NOTE — Telephone Encounter (Signed)
Pt needs a refill on her hydrocodone 7.5-325  [facility]   Thanks teri

## 2018-06-15 MED ORDER — HYDROCODONE-ACETAMINOPHEN 7.5-325 MG PO TABS: 1.0000 | ORAL_TABLET | Freq: Four times a day (QID) | ORAL | 0 refills | Status: DC | PRN

## 2018-06-18 ENCOUNTER — Other Ambulatory Visit: Payer: Self-pay | Admitting: Family Medicine

## 2018-06-18 DIAGNOSIS — F32A Depression, unspecified: Secondary | ICD-10-CM

## 2018-06-18 DIAGNOSIS — F329 Major depressive disorder, single episode, unspecified: Secondary | ICD-10-CM

## 2018-06-22 ENCOUNTER — Other Ambulatory Visit: Payer: Self-pay | Admitting: Family Medicine

## 2018-06-22 DIAGNOSIS — S149XXA Injury of unspecified nerves of neck, initial encounter: Secondary | ICD-10-CM

## 2018-06-22 NOTE — Addendum Note (Signed)
Addended by: Julieta Bellini on: 06/22/2018 02:03 PM   Modules accepted: Orders

## 2018-06-22 NOTE — Telephone Encounter (Signed)
Pt requesting medication refill of Hydrocodone 7.5-[facility] [address]   Pt states she is currently out of medication.

## 2018-06-23 MED ORDER — HYDROCODONE-ACETAMINOPHEN 7.5-325 MG PO TABS: 1.0000 | ORAL_TABLET | Freq: Four times a day (QID) | ORAL | 0 refills | Status: DC | PRN

## 2018-06-27 ENCOUNTER — Ambulatory Visit (INDEPENDENT_AMBULATORY_CARE_PROVIDER_SITE_OTHER): Payer: BLUE CROSS/BLUE SHIELD

## 2018-06-27 ENCOUNTER — Other Ambulatory Visit: Payer: Self-pay | Admitting: Family Medicine

## 2018-06-27 VITALS — BP 140/72 | HR 78 | Temp 98.4°F | Ht 65.0 in | Wt 172.4 lb

## 2018-06-27 DIAGNOSIS — Z0000 Encounter for general adult medical examination without abnormal findings: Secondary | ICD-10-CM | POA: Diagnosis not present

## 2018-06-27 DIAGNOSIS — Z1211 Encounter for screening for malignant neoplasm of colon: Secondary | ICD-10-CM | POA: Diagnosis not present

## 2018-06-27 NOTE — Progress Notes (Signed)
Subjective:   Jamie Williams is a 57 y.o. female who presents for Medicare Annual (Subsequent) preventive examination.  Review of Systems:  N/A  Cardiac Risk Factors include: dyslipidemia;smoking/ tobacco exposure     Objective:     Vitals: BP 140/72 (BP Location: Right Arm)   Pulse 78   Temp 98.4 F (36.9 C) (Oral)   Ht 5\' 5"  (1.651 m)   Wt 172 lb 6.4 oz (78.2 kg)   BMI 28.69 kg/m   Body mass index is 28.69 kg/m.  Advanced Directives 06/27/2018 02/22/2018 09/30/2016 06/06/2016 12/16/2015  Does Patient Have a Medical Advance Directive? No No No No No  Does patient want to make changes to medical advance directive? Yes (MAU/Ambulatory/Procedural Areas - Information given) - - - -  Would patient like information on creating a medical advance directive? - No - Patient declined Yes (MAU/Ambulatory/Procedural Areas - Information given) No - patient declined information Yes - Educational materials given    Tobacco Social History   Tobacco Use  Smoking Status Current Every Day Smoker  . Packs/day: 0.50  Smokeless Tobacco Never Used  Tobacco Comment   Started about 57 years old usually 1/2 ppd     Ready to quit: Not Answered Counseling given: Not Answered Comment: Started about 57 years old usually 1/2 ppd   Clinical Intake:  Pre-visit preparation completed: Yes  Pain : No/denies pain Pain Score: 0-No pain     Nutritional Status: BMI 25 -29 Overweight Nutritional Risks: None Diabetes: No  How often do you need to have someone help you when you read instructions, pamphlets, or other written materials from your doctor or pharmacy?: 1 - Never  Interpreter Needed?: No  Information entered by :: [facility], LPN  Past Medical History:  Diagnosis Date  . Anxiety   . Arthritis    hands  . Depression   . Family history of adverse reaction to anesthesia    "sister had a difficult time waking up from mastectomy and passed away"   . GERD (gastroesophageal reflux  disease)   . Headache    migraines  . History of chicken pox   . History of kidney stones   . Hypercholesteremia   . Hypertension   . Numbness and tingling in left arm   . PONV (postoperative nausea and vomiting)    nausea  . Urinary frequency    Past Surgical History:  Procedure Laterality Date  . ABDOMINAL HYSTERECTOMY  2011   Abdominal; Ovaries intact; CERVIX INTACT. Fibroids/dys menorrhea. [place]  . ANTERIOR CERVICAL DECOMP/DISCECTOMY FUSION N/A 12/24/2015   Procedure: Cervical five-six, Cervical six-seven  Anterior cervical decompression/diskectomy/fusion/interbody prosthesis/plate;  Surgeon: Newman Pies, MD;  Location: [facility];  Service: Neurosurgery;  Laterality: N/A;  . [place] and 1996   two Etopic preg.  Marland Kitchen Head Injuries  2006   surgery 2006 Cram/NS in [place]. Headaches with increased intracranial pressure. Repeat MRI in 2008 Negative  . Ulnar Neuropathy Right 2004   Ulnar Neuropathy surgical revision. 2004-2008. [place].   Family History  Problem Relation Age of Onset  . Hypertension Mother   . Diabetes Mother        Type 2  . Breast cancer Mother 44  . Cancer Sister 68       Breast  . Diabetes Sister        type 2  . Breast cancer Sister 51  . Diabetes Sister   . Diabetes Sister   . Diabetes Sister  type 2  . Liver cancer Brother   . Stomach cancer Brother    Social History   Socioeconomic History  . Marital status: Married    Spouse name: Not on file  . Number of children: 1  . Years of education: Not on file  . Highest education level: High school graduate  Occupational History  . Occupation: disabled  Social Needs  . Financial resource strain: Not very hard  . Food insecurity:    Worry: Sometimes true    Inability: Sometimes true  . Transportation needs:    Medical: No    Non-medical: No  Tobacco Use  . Smoking status: Current Every Day Smoker    Packs/day: 0.50  . Smokeless tobacco: Never  Used  . Tobacco comment: Started about 57 years old usually 1/2 ppd  Substance and Sexual Activity  . Alcohol use: Not Currently    Alcohol/week: 0.0 standard drinks  . Drug use: Not Currently  . Sexual activity: Not on file  Lifestyle  . Physical activity:    Days per week: Not on file    Minutes per session: Not on file  . Stress: Rather much  Relationships  . Social connections:    Talks on phone: Not on file    Gets together: Not on file    Attends religious service: Not on file    Active member of club or organization: Not on file    Attends meetings of clubs or organizations: Not on file    Relationship status: Not on file  Other Topics Concern  . Not on file  Social History Narrative  . Not on file    Outpatient Encounter Medications as of 06/27/2018  Medication Sig  . albuterol (PROVENTIL HFA;VENTOLIN HFA) 108 (90 Base) MCG/ACT inhaler Inhale 2 puffs into the lungs every 6 (six) hours as needed for wheezing or shortness of breath.  Marland Kitchen amLODipine (NORVASC) 10 MG tablet TAKE 1 TABLET BY MOUTH ONCE A DAY  . buPROPion (WELLBUTRIN SR) 150 MG 12 hr tablet TAKE 1 TABLET BY MOUTH TWICE A DAY  . cholecalciferol (VITAMIN D) 1000 units tablet Take 1,000 Units by mouth daily.  Marland Kitchen estradiol (ESTRACE) 1 MG tablet TAKE 1 TABLET (1 MG TOTAL) BY MOUTH DAILY.  Marland Kitchen gabapentin (NEURONTIN) 300 MG capsule Take 1 capsule (300 mg total) by mouth 3 (three) times daily.  Marland Kitchen HYDROcodone-acetaminophen (NORCO) 7.5-325 MG tablet Take 1 tablet by mouth every 6 (six) hours as needed for moderate pain.  . meloxicam (MOBIC) 15 MG tablet TAKE 1 TABLET BY MOUTH DAILY WITH FOOD  . PARoxetine (PAXIL) 30 MG tablet TAKE 2 TABLETS (60 MG TOTAL) BY MOUTH DAILY.  Marland Kitchen promethazine (PHENERGAN) 25 MG tablet TAKE 1 TABLET EVERY 4 TO 6 HOURS AS NEEDED FOR NAUSEA  . ranitidine (ZANTAC) 150 MG tablet RANITIDINE HCL, 150MG  (Oral Tablet)  1 Two Times A Day for heartburn, indigestion, nausea for 0 days  Quantity: 60.00;  Refills:  5   Ordered :22-Jul-2010  Reginia Forts MD;  Buddy Duty 28-Jan[birth year]2010 Active Comments: DX: 787.02  . cyclobenzaprine (FLEXERIL) 5 MG tablet Take 1 tablet (5 mg total) by mouth 3 (three) times daily as needed for muscle spasms. (Patient not taking: Reported on 06/27/2018)  . simvastatin (ZOCOR) 40 MG tablet Take 1 tablet (40 mg total) by mouth daily. (Patient not taking: Reported on 03/07/2018)   No facility-administered encounter medications on file as of 06/27/2018.     Activities of Daily Living In your present state of health,  do you have any difficulty performing the following activities: 06/27/2018  Hearing? N  Vision? Y  Comment Needs an updated eyeglass prescription.   Difficulty concentrating or making decisions? N  Walking or climbing stairs? Y  Comment Due to knee pain.   Dressing or bathing? Y  Comment Needs help doing hair and getting dressed due to not being able to lift left shoulder.   Doing errands, shopping? N  Preparing Food and eating ? N  Using the Toilet? N  In the past six months, have you accidently leaked urine? N  Do you have problems with loss of bowel control? N  Managing your Medications? N  Managing your Finances? N  Housekeeping or managing your Housekeeping? N  Some recent data might be hidden    Patient Care Team: [name], MD as PCP - General (Family Medicine)    Assessment:   This is a routine wellness examination for Telia.  Exercise Activities and Dietary recommendations Current Exercise Habits: The patient does not participate in regular exercise at present, Exercise limited by: orthopedic condition(s);Other - see comments(hot flashes)  Goals    . DIET - REDUCE PORTION SIZE     Recommend to eat 3 small meals a day with two healthy snacks in between.     . Increase water intake     Starting 09/30/16, I will continue to drink 4 glasses of water a day.       Fall Risk Fall Risk  06/27/2018 09/30/2016  Falls in the past year? No No   Is  the patient's home free of loose throw rugs in walkways, pet beds, electrical cords, etc?   yes      Grab bars in the bathroom? yes      Handrails on the stairs?   no      Adequate lighting?   yes  Timed Get Up and Go performed: N/A  Depression Screen PHQ 2/9 Scores 06/27/2018 11/14/2017 09/30/2016  PHQ - 2 Score 2 2 0  PHQ- 9 Score 5 4 -     Cognitive Function:      6CIT Screen 06/27/2018 09/30/2016  What Year? 0 points 0 points  What month? 0 points 0 points  What time? 0 points 0 points  Count back from 20 0 points 0 points  Months in reverse 2 points 2 points  Repeat phrase 0 points 4 points  Total Score 2 6    Immunization History  Administered Date(s) Administered  . Influenza,inj,Quad PF,6+ Mos 08/29/2015, 08/26/2016, 11/14/2017  . Pneumococcal Polysaccharide-23 02/24/2011  . Tdap 01/28/2010    Qualifies for Shingles Vaccine? Due for Shingles vaccine. Declined my offer to administer today. Education has been provided regarding the importance of this vaccine. Pt has been advised to call her insurance company to determine her out of pocket expense. Advised she may also receive this vaccine at her local pharmacy or Health Dept. Verbalized acceptance and understanding.  Screening Tests Health Maintenance  Topic Date Due  . COLONOSCOPY  11/11/2012  . PAP SMEAR  10/25/2016  . MAMMOGRAM  09/09/2017  . INFLUENZA VACCINE  05/25/2018  . TETANUS/TDAP  01/29/2020  . Hepatitis C Screening  Completed  . HIV Screening  Completed    Cancer Screenings: Lung: Low Dose CT Chest recommended if Age 9-80 years, 30 pack-year currently smoking OR have quit w/in 15years. Patient does qualify. An Epic message has been sent to Burgess Estelle, RN (Oncology Nurse Navigator) regarding the possible need for this exam.  Raquel Sarna will review the patient's chart to determine if the patient truly qualifies for the exam. If the patient qualifies, Raquel Sarna will order the Low Dose CT of the chest to facilitate  the scheduling of this exam. Breast:  Up to date on Mammogram? No   Up to date of Bone Density/Dexa? N/A Colorectal: Referral sent today.   Additional Screenings:  Hepatitis C Screening: Up to date     Plan:  I have personally reviewed and addressed the Medicare Annual Wellness questionnaire and have noted the following in the patient's chart:  A. Medical and social history B. Use of alcohol, tobacco or illicit drugs  C. Current medications and supplements D. Functional ability and status E.  Nutritional status F.  Physical activity G. Advance directives H. List of other physicians I.  Hospitalizations, surgeries, and ER visits in previous 12 months J.  [place] such as hearing and vision if needed, cognitive and depression L. Referrals and appointments - none  In addition, I have reviewed and discussed with patient certain preventive protocols, quality metrics, and best practice recommendations. A written personalized care plan for preventive services as well as general preventive health recommendations were provided to patient.  See attached scanned questionnaire for additional information.   Signed,  Fabio Neighbors, LPN Nurse Health Advisor   Nurse Recommendations: Pt declined the influenza vaccine today. Pt would like to receive this at her next OV on 07/11/18. Referral for colonoscopy sent. Pt to set up mammogram this year. CT scan referral sent to [facility] to set up and contact pt.

## 2018-06-27 NOTE — Patient Instructions (Addendum)
[name] , Thank you for taking time to come for your Medicare Wellness Visit. I appreciate your ongoing commitment to your health goals. Please review the following plan we discussed and let me know if I can assist you in the future.   Screening recommendations/referrals: Colonoscopy: Referral sent today.  Mammogram: Pt to set up apt this year.  Bone Density: N/A Recommended yearly ophthalmology/optometry visit for glaucoma screening and checkup Recommended yearly dental visit for hygiene and checkup  Vaccinations: Influenza vaccine: Pt declines today.  Pneumococcal vaccine: N/A Tdap vaccine: Up to date Shingles vaccine: N/A    Advanced directives: Advance directive discussed with you today. I have provided a copy for you to complete at home and have notarized. Once this is complete please bring a copy in to our office so we can scan it into your chart.  Conditions/risks identified: Recommend to eat 3 small meals a day with two healthy snacks in between.   Next appointment: 07/11/18 @ 9 AM with Dr Caryn Section.   Preventive Care 40-64 Years, Female Preventive care refers to lifestyle choices and visits with your health care provider that can promote health and wellness. What does preventive care include?  A yearly physical exam. This is also called an annual well check.  Dental exams once or twice a year.  Routine eye exams. Ask your health care provider how often you should have your eyes checked.  Personal lifestyle choices, including:  Daily care of your teeth and gums.  Regular physical activity.  Eating a healthy diet.  Avoiding tobacco and drug use.  Limiting alcohol use.  Practicing safe sex.  Taking low-dose aspirin daily starting at age 18.  Taking vitamin and mineral supplements as recommended by your health care provider. What happens during an annual well check? The services and screenings done by your health care provider during your annual well check will  depend on your age, overall health, lifestyle risk factors, and family history of disease. Counseling  Your health care provider may ask you questions about your:  Alcohol use.  Tobacco use.  Drug use.  Emotional well-being.  Home and relationship well-being.  Sexual activity.  Eating habits.  Work and work Statistician.  Method of birth control.  Menstrual cycle.  Pregnancy history. Screening  You may have the following tests or measurements:  Height, weight, and BMI.  Blood pressure.  Lipid and cholesterol levels. These may be checked every 5 years, or more frequently if you are over 79 years old.  Skin check.  Lung cancer screening. You may have this screening every year starting at age 71 if you have a 30-pack-year history of smoking and currently smoke or have quit within the past 15 years.  Fecal occult blood test (FOBT) of the stool. You may have this test every year starting at age 19.  Flexible sigmoidoscopy or colonoscopy. You may have a sigmoidoscopy every 5 years or a colonoscopy every 10 years starting at age 43.  Hepatitis C blood test.  Hepatitis B blood test.  Sexually transmitted disease (STD) testing.  Diabetes screening. This is done by checking your blood sugar (glucose) after you have not eaten for a while (fasting). You may have this done every 1-3 years.  Mammogram. This may be done every 1-2 years. Talk to your health care provider about when you should start having regular mammograms. This may depend on whether you have a family history of breast cancer.  BRCA-related cancer screening. This may be done if you  have a family history of breast, ovarian, tubal, or peritoneal cancers.  Pelvic exam and Pap test. This may be done every 3 years starting at age 95. Starting at age 2, this may be done every 5 years if you have a Pap test in combination with an HPV test.  Bone density scan. This is done to screen for osteoporosis. You may have  this scan if you are at high risk for osteoporosis. Discuss your test results, treatment options, and if necessary, the need for more tests with your health care provider. Vaccines  Your health care provider may recommend certain vaccines, such as:  Influenza vaccine. This is recommended every year.  Tetanus, diphtheria, and acellular pertussis (Tdap, Td) vaccine. You may need a Td booster every 10 years.  Zoster vaccine. You may need this after age 43.  Pneumococcal 13-valent conjugate (PCV13) vaccine. You may need this if you have certain conditions and were not previously vaccinated.  Pneumococcal polysaccharide (PPSV23) vaccine. You may need one or two doses if you smoke cigarettes or if you have certain conditions. Talk to your health care provider about which screenings and vaccines you need and how often you need them. This information is not intended to replace advice given to you by your health care provider. Make sure you discuss any questions you have with your health care provider. Document Released: 11/07/2015 Document Revised: 06/30/2016 Document Reviewed: 08/12/2015 Elsevier Interactive Patient Education  2017 [employer] Prevention in the Home Falls can cause injuries. They can happen to people of all ages. There are many things you can do to make your home safe and to help prevent falls. What can I do on the outside of my home?  Regularly fix the edges of walkways and driveways and fix any cracks.  Remove anything that might make you trip as you walk through a door, such as a raised step or threshold.  Trim any bushes or trees on the path to your home.  Use bright outdoor lighting.  Clear any walking paths of anything that might make someone trip, such as rocks or tools.  Regularly check to see if handrails are loose or broken. Make sure that both sides of any steps have handrails.  Any raised decks and porches should have guardrails on the  edges.  Have any leaves, snow, or ice cleared regularly.  Use sand or salt on walking paths during winter.  Clean up any spills in your garage right away. This includes oil or grease spills. What can I do in the bathroom?  Use night lights.  Install grab bars by the toilet and in the tub and shower. Do not use towel bars as grab bars.  Use non-skid mats or decals in the tub or shower.  If you need to sit down in the shower, use a plastic, non-slip stool.  Keep the floor dry. Clean up any water that spills on the floor as soon as it happens.  Remove soap buildup in the tub or shower regularly.  Attach bath mats securely with double-sided non-slip rug tape.  Do not have throw rugs and other things on the floor that can make you trip. What can I do in the bedroom?  Use night lights.  Make sure that you have a light by your bed that is easy to reach.  Do not use any sheets or blankets that are too big for your bed. They should not hang down onto the floor.  Have  a firm chair that has side arms. You can use this for support while you get dressed.  Do not have throw rugs and other things on the floor that can make you trip. What can I do in the kitchen?  Clean up any spills right away.  Avoid walking on wet floors.  Keep items that you use a lot in easy-to-reach places.  If you need to reach something above you, use a strong step stool that has a grab bar.  Keep electrical cords out of the way.  Do not use floor polish or wax that makes floors slippery. If you must use wax, use non-skid floor wax.  Do not have throw rugs and other things on the floor that can make you trip. What can I do with my stairs?  Do not leave any items on the stairs.  Make sure that there are handrails on both sides of the stairs and use them. Fix handrails that are broken or loose. Make sure that handrails are as long as the stairways.  Check any carpeting to make sure that it is firmly  attached to the stairs. Fix any carpet that is loose or worn.  Avoid having throw rugs at the top or bottom of the stairs. If you do have throw rugs, attach them to the floor with carpet tape.  Make sure that you have a light switch at the top of the stairs and the bottom of the stairs. If you do not have them, ask someone to add them for you. What else can I do to help prevent falls?  Wear shoes that:  Do not have high heels.  Have rubber bottoms.  Are comfortable and fit you well.  Are closed at the toe. Do not wear sandals.  If you use a stepladder:  Make sure that it is fully opened. Do not climb a closed stepladder.  Make sure that both sides of the stepladder are locked into place.  Ask someone to hold it for you, if possible.  Clearly mark and make sure that you can see:  Any grab bars or handrails.  First and last steps.  Where the edge of each step is.  Use tools that help you move around (mobility aids) if they are needed. These include:  Canes.  Walkers.  Scooters.  Crutches.  Turn on the lights when you go into a dark area. Replace any light bulbs as soon as they burn out.  Set up your furniture so you have a clear path. Avoid moving your furniture around.  If any of your floors are uneven, fix them.  If there are any pets around you, be aware of where they are.  Review your medicines with your doctor. Some medicines can make you feel dizzy. This can increase your chance of falling. Ask your doctor what other things that you can do to help prevent falls. This information is not intended to replace advice given to you by your health care provider. Make sure you discuss any questions you have with your health care provider. Document Released: 08/07/2009 Document Revised: 03/18/2016 Document Reviewed: 11/15/2014 Elsevier Interactive Patient Education  2017 [employer].

## 2018-06-28 NOTE — Telephone Encounter (Signed)
AWV completed 06/27/18. -MM

## 2018-06-30 ENCOUNTER — Other Ambulatory Visit: Payer: Self-pay

## 2018-06-30 DIAGNOSIS — S149XXA Injury of unspecified nerves of neck, initial encounter: Secondary | ICD-10-CM

## 2018-06-30 DIAGNOSIS — Z1211 Encounter for screening for malignant neoplasm of colon: Secondary | ICD-10-CM

## 2018-06-30 MED ORDER — HYDROCODONE-ACETAMINOPHEN 7.5-325 MG PO TABS: 1.0000 | ORAL_TABLET | Freq: Four times a day (QID) | ORAL | 0 refills | Status: DC | PRN

## 2018-06-30 NOTE — Telephone Encounter (Signed)
Patient is requesting a refill on the following medication. HYDROcodone-acetaminophen (NORCO) 7.5-325 MG table  Pharmacy: [facility]  Thanks,  -Joseline

## 2018-06-30 NOTE — Telephone Encounter (Signed)
Please review. Thanks!  

## 2018-07-03 ENCOUNTER — Other Ambulatory Visit: Payer: Self-pay

## 2018-07-11 ENCOUNTER — Encounter: Payer: Self-pay | Admitting: Family Medicine

## 2018-07-11 ENCOUNTER — Other Ambulatory Visit (HOSPITAL_COMMUNITY)
Admission: RE | Admit: 2018-07-11 | Discharge: 2018-07-11 | Disposition: A | Discharge status: Home / Self Care | Payer: BLUE CROSS/BLUE SHIELD | Source: Ambulatory Visit | Attending: Family Medicine | Admitting: Family Medicine

## 2018-07-11 ENCOUNTER — Ambulatory Visit (INDEPENDENT_AMBULATORY_CARE_PROVIDER_SITE_OTHER): Payer: BLUE CROSS/BLUE SHIELD | Admitting: Family Medicine

## 2018-07-11 VITALS — BP 151/92 | HR 70 | Temp 98.4°F | Resp 16 | Ht 65.0 in | Wt 174.0 lb

## 2018-07-11 DIAGNOSIS — Z1211 Encounter for screening for malignant neoplasm of colon: Secondary | ICD-10-CM | POA: Diagnosis not present

## 2018-07-11 DIAGNOSIS — Z1151 Encounter for screening for human papillomavirus (HPV): Secondary | ICD-10-CM | POA: Diagnosis not present

## 2018-07-11 DIAGNOSIS — Z0000 Encounter for general adult medical examination without abnormal findings: Secondary | ICD-10-CM

## 2018-07-11 DIAGNOSIS — Z23 Encounter for immunization: Secondary | ICD-10-CM | POA: Diagnosis not present

## 2018-07-11 DIAGNOSIS — K219 Gastro-esophageal reflux disease without esophagitis: Secondary | ICD-10-CM | POA: Insufficient documentation

## 2018-07-11 DIAGNOSIS — Z124 Encounter for screening for malignant neoplasm of cervix: Secondary | ICD-10-CM | POA: Diagnosis not present

## 2018-07-11 DIAGNOSIS — I10 Essential (primary) hypertension: Secondary | ICD-10-CM | POA: Diagnosis not present

## 2018-07-11 DIAGNOSIS — Z90711 Acquired absence of uterus with remaining cervical stump: Secondary | ICD-10-CM | POA: Insufficient documentation

## 2018-07-11 DIAGNOSIS — S149XXA Injury of unspecified nerves of neck, initial encounter: Secondary | ICD-10-CM

## 2018-07-11 MED ORDER — HYDROCODONE-ACETAMINOPHEN 7.5-325 MG PO TABS: 1.0000 | ORAL_TABLET | Freq: Four times a day (QID) | ORAL | 0 refills | Status: DC | PRN

## 2018-07-11 MED ORDER — AMLODIPINE BESYLATE 10 MG PO TABS: 10.0000 mg | ORAL_TABLET | Freq: Every day | ORAL | 11 refills | Status: DC

## 2018-07-11 NOTE — Progress Notes (Signed)
Patient: Jamie Williams, Female    DOB: 11-06-1960, 57 y.o.   MRN: [id number] Visit Date: 07/11/2018  Today's Provider: Lelon Huh, MD   Chief Complaint  Patient presents with  . Annual Exam  . Hypertension  . Hyperlipidemia   Subjective:    Annual physical exam Jamie Williams is a 57 y.o. female who presents today for health maintenance and complete physical. She feels fairly well. She reports no regular exercising. She reports she is sleeping fairly well.  -----------------------------------------------------------------   Current diet: well balanced  ------------------------------------------------------------------------   Review of Systems  Constitutional: Positive for appetite change and unexpected weight change. Negative for chills, fatigue and fever.  HENT: Positive for sinus pressure. Negative for congestion, ear pain, rhinorrhea, sneezing and sore throat.   Eyes: Negative.  Negative for pain and redness.  Respiratory: Negative for cough, shortness of breath and wheezing.   Cardiovascular: Negative for chest pain and leg swelling.  Gastrointestinal: Positive for nausea. Negative for abdominal pain, blood in stool, constipation and diarrhea.  Endocrine: Negative for polydipsia and polyphagia.  Genitourinary: Negative.  Negative for dysuria, flank pain, hematuria, pelvic pain, vaginal bleeding and vaginal discharge.  Musculoskeletal: Positive for neck pain and neck stiffness. Negative for arthralgias, back pain, gait problem and joint swelling.  Skin: Negative for rash.  Neurological: Positive for headaches. Negative for dizziness, tremors, seizures, weakness, light-headedness and numbness.  Hematological: Negative for adenopathy.  Psychiatric/Behavioral: Negative.  Negative for behavioral problems, confusion and dysphoric mood. The patient is not nervous/anxious and is not hyperactive.     Social History      She  reports that she has been smoking. She  has been smoking about 0.50 packs per day. She has never used smokeless tobacco. She reports that she drank alcohol. She reports that she has current or past drug history.       Social History   Socioeconomic History  . Marital status: Married    Spouse name: Not on file  . Number of children: 1  . Years of education: Not on file  . Highest education level: High school graduate  Occupational History  . Occupation: disabled  Social Needs  . Financial resource strain: Not very hard  . Food insecurity:    Worry: Sometimes true    Inability: Sometimes true  . Transportation needs:    Medical: No    Non-medical: No  Tobacco Use  . Smoking status: Current Every Day Smoker    Packs/day: 0.50  . Smokeless tobacco: Never Used  . Tobacco comment: Started about 57 years old usually 1/2 ppd  Substance and Sexual Activity  . Alcohol use: Not Currently    Alcohol/week: 0.0 standard drinks  . Drug use: Not Currently  . Sexual activity: Not on file  Lifestyle  . Physical activity:    Days per week: Not on file    Minutes per session: Not on file  . Stress: Rather much  Relationships  . Social connections:    Talks on phone: Not on file    Gets together: Not on file    Attends religious service: Not on file    Active member of club or organization: Not on file    Attends meetings of clubs or organizations: Not on file    Relationship status: Not on file  Other Topics Concern  . Not on file  Social History Narrative  . Not on file    Past Medical History:  Diagnosis  Date  . Anxiety   . Family history of adverse reaction to anesthesia    "sister had a difficult time waking up from mastectomy and passed away"   . GERD (gastroesophageal reflux disease)   . History of chicken pox   . History of kidney stones   . PONV (postoperative nausea and vomiting)    nausea     Patient Active Problem List   Diagnosis Date Noted  . GERD (gastroesophageal reflux disease)   . Menopausal  symptoms 01/10/2017  . Cervical spondylosis with radiculopathy 12/24/2015  . Compression injury of cervical spinal nerve 10/07/2015  . Left arm pain 08/29/2015  . Neck pain 08/29/2015  . Hematuria 07/03/2015  . Allergic arthritis 06/27/2015  . Arthritis 06/27/2015  . Back ache 06/27/2015  . Clinical depression 06/27/2015  . H/O abnormal cervical Papanicolaou smear 06/27/2015  . Elevated intracranial pressure 06/27/2015  . Kidney stones 06/27/2015  . Knee pain 06/27/2015  . Abnormal mammogram 06/27/2015  . Headache, migraine 06/27/2015  . Neuropathy 06/27/2015  . Hand paresthesia 06/27/2015  . Pre-diabetes 06/27/2015  . Vitamin D deficiency 06/27/2015  . Weight loss 06/27/2015  . Adjustment reaction 06/27/2015  . COPD (chronic obstructive pulmonary disease) ([place]) 02/24/2011  . Hypercholesterolemia without hypertriglyceridemia 01/29/2010  . Tobacco abuse 01/28/2010  . Benign essential HTN 09/16/2009  . Insomnia 09/16/2009  . Lesion of ulnar nerve 09/16/2009    Past Surgical History:  Procedure Laterality Date  . ABDOMINAL HYSTERECTOMY  2011   Abdominal; Ovaries intact; CERVIX INTACT. Fibroids/dys menorrhea. Weaver-Lee  . ANTERIOR CERVICAL DECOMP/DISCECTOMY FUSION N/A 12/24/2015   Procedure: Cervical five-six, Cervical six-seven  Anterior cervical decompression/diskectomy/fusion/interbody prosthesis/plate;  Surgeon: Newman Pies, MD;  Location: [facility];  Service: Neurosurgery;  Laterality: N/A;  . [place] and 1996   two Etopic preg.  [place] Kitchen Head Injuries  2006   surgery 2006 Cram/NS in [place]. Headaches with increased intracranial pressure. Repeat MRI in 2008 Negative  . Ulnar Neuropathy Right 2004   Ulnar Neuropathy surgical revision. 2004-2008. [place].    Family History        Family Status  Relation Name Status  . Mother  Deceased at age 47'       from laryngeal cancer. non-Smoker. non-drinker.Dipped snuff.  . Sister 1 Deceased  .  Sister 2 Alive  . Sister 3 Alive  . Sister 4 Alive       with Kidney Failure/HD, CAD stent  . Father  Deceased at age 36's       From AMI:CAD/AMI, hx of CVA  . Brother 1 Deceased       Died from cancer(unknown type) 2014/05/28  . Brother 2 Deceased        Her family history includes Breast cancer (age of onset: 36) in her sister; Breast cancer (age of onset: 68) in her mother; Cancer (age of onset: 38) in her sister; Diabetes in her mother, sister, sister, sister, and sister; Hypertension in her mother; Liver cancer in her brother; Stomach cancer in her brother.      Allergies  Allergen Reactions  . Aspirin     nausea with vomiting.  . Sulfa Antibiotics     hives.  . Penicillins Rash     Current Outpatient Medications:  .  albuterol (PROVENTIL HFA;VENTOLIN HFA) 108 (90 Base) MCG/ACT inhaler, Inhale 2 puffs into the lungs every 6 (six) hours as needed for wheezing or shortness of breath., Disp: 1 Inhaler, Rfl: 2 .  amLODipine (NORVASC)  10 MG tablet, TAKE 1 TABLET BY MOUTH ONCE A DAY, Disp: 30 tablet, Rfl: 5 .  buPROPion (WELLBUTRIN SR) 150 MG 12 hr tablet, TAKE 1 TABLET BY MOUTH TWICE A DAY, Disp: 60 tablet, Rfl: 5 .  cholecalciferol (VITAMIN D) 1000 units tablet, Take 1,000 Units by mouth daily., Disp: , Rfl:  .  cyclobenzaprine (FLEXERIL) 5 MG tablet, Take 1 tablet (5 mg total) by mouth 3 (three) times daily as needed for muscle spasms., Disp: 21 tablet, Rfl: 0 .  estradiol (ESTRACE) 1 MG tablet, TAKE 1 TABLET (1 MG TOTAL) BY MOUTH DAILY., Disp: 30 tablet, Rfl: 5 .  gabapentin (NEURONTIN) 300 MG capsule, Take 1 capsule (300 mg total) by mouth 3 (three) times daily., Disp: 90 capsule, Rfl: 3 .  HYDROcodone-acetaminophen (NORCO) 7.5-325 MG tablet, Take 1 tablet by mouth every 6 (six) hours as needed for moderate pain., Disp: 30 tablet, Rfl: 0 .  meloxicam (MOBIC) 15 MG tablet, TAKE 1 TABLET BY MOUTH DAILY WITH FOOD, Disp: 30 tablet, Rfl: 5 .  PARoxetine (PAXIL) 30 MG tablet, TAKE 2  TABLETS (60 MG TOTAL) BY MOUTH DAILY., Disp: 60 tablet, Rfl: 11 .  promethazine (PHENERGAN) 25 MG tablet, TAKE 1 TABLET EVERY 4 TO 6 HOURS AS NEEDED FOR NAUSEA, Disp: 30 tablet, Rfl: 5 .  ranitidine (ZANTAC) 150 MG tablet, RANITIDINE HCL, 150MG  (Oral Tablet)  1 Two Times A Day for heartburn, indigestion, nausea for 0 days  Quantity: 60.00;  Refills: 5   Ordered :22-Jul-2010  Reginia Forts MD;  Buddy Duty 28-Jan-2010 Active Comments: DX: 787.02, Disp: , Rfl:  .  simvastatin (ZOCOR) 40 MG tablet, Take 1 tablet (40 mg total) by mouth daily., Disp: 30 tablet, Rfl: 6   Patient Care Team: [name], MD as PCP - General (Family Medicine)      Objective:   Vitals: BP (!) 151/92 (BP Location: Right Arm, Patient Position: Sitting, Cuff Size: Large)   Pulse 70   Temp 98.4 F (36.9 C) (Oral)   Resp 16   Ht 5\' 5"  (1.651 m)   Wt 174 lb (78.9 kg)   SpO2 97% Comment: room air  BMI 28.96 kg/m    Vitals:   07/11/18 0917 07/11/18 0925  BP: (!) 149/90 (!) 151/92  Pulse: 70   Resp: 16   Temp: 98.4 F (36.9 C)   TempSrc: Oral   SpO2: 97%   Weight: 174 lb (78.9 kg)   Height: 5\' 5"  (1.651 m)      Physical Exam   General Appearance:    Alert, cooperative, no distress, appears stated age  Head:    Normocephalic, without obvious abnormality, atraumatic  Eyes:    PERRL, conjunctiva/corneas clear, EOM's intact, fundi    benign, both eyes  Ears:    Normal TM's and external ear canals, both ears  Nose:   Nares normal, septum midline, mucosa normal, no drainage    or sinus tenderness  Throat:   Lips, mucosa, and tongue normal; teeth and gums normal  Neck:   Supple, symmetrical, trachea midline, no adenopathy;    thyroid:  no enlargement/tenderness/nodules; no carotid   bruit or JVD  Back:     Symmetric, no curvature, ROM normal, no CVA tenderness  Lungs:     Clear to auscultation bilaterally, respirations unlabored  Chest Wall:    No tenderness or deformity   Heart:    Regular rate and rhythm,  S1 and S2 normal, no murmur, rub   or gallop  Breast  Exam:    normal appearance, no masses or tenderness  Abdomen:     Soft, non-tender, bowel sounds active all four quadrants,    no masses, no organomegaly  Pelvic:    cervix normal in appearance  Extremities:   Extremities normal, atraumatic, no cyanosis or edema  Pulses:   2+ and symmetric all extremities  Skin:   Skin color, texture, turgor normal, no rashes or lesions  Lymph nodes:   Cervical, supraclavicular, and axillary nodes normal  Neurologic:   CNII-XII intact, normal strength, sensation and reflexes    throughout   Depression Screen PHQ 2/9 Scores 06/27/2018 11/14/2017 09/30/2016  PHQ - 2 Score 2 2 0  PHQ- 9 Score 5 4 -      Assessment & Plan:     Routine Health Maintenance and Physical Exam  Exercise Activities and Dietary recommendations Goals    . DIET - REDUCE PORTION SIZE     Recommend to eat 3 small meals a day with two healthy snacks in between.     . Increase water intake     Starting 09/30/16, I will continue to drink 4 glasses of water a day.       Immunization History  Administered Date(s) Administered  . Influenza,inj,Quad PF,6+ Mos 08/29/2015, 08/26/2016, 11/14/2017  . Pneumococcal Polysaccharide-23 02/24/2011  . Tdap 01/28/2010    Health Maintenance  Topic Date Due  . COLONOSCOPY  11/11/2012  . PAP SMEAR  10/25/2016  . MAMMOGRAM  09/09/2017  . INFLUENZA VACCINE  05/25/2018  . TETANUS/TDAP  01/29/2020  . Hepatitis C Screening  Completed  . HIV Screening  Completed     Discussed health benefits of physical activity, and encouraged her to engage in regular exercise appropriate for her age and condition.    --------------------------------------------------------------------  1. Annual physical exam   2. Need for influenza vaccination  - Flu Vaccine QUAD 6+ mos PF IM (Fluarix Quad PF)  3. Screening for colon cancer She reports she is scheduled for screening colonoscopy.   4. Benign  essential HTN Well controlled.  Continue current medications.  refill- amLODipine (NORVASC) 10 MG tablet; Take 1 tablet (10 mg total) by mouth daily.  Dispense: 30 tablet; Refill: 11  5. Cervical cancer screening  - Cytology - PAP  6. Compression injury of cervical spinal nerve Pain adequately controlled on current pain medications.  - HYDROcodone-acetaminophen (NORCO) 7.5-325 MG tablet; Take 1 tablet by mouth every 6 (six) hours as needed for moderate pain.  Dispense: 30 tablet; Refill: 0  Return in about 3 months (around 10/10/2018).    Lelon Huh, MD  [facility]

## 2018-07-17 ENCOUNTER — Encounter
Admission: RE | Disposition: A | Discharge status: Home / Self Care | Payer: Self-pay | Source: Ambulatory Visit | Attending: Gastroenterology

## 2018-07-17 ENCOUNTER — Ambulatory Visit
Admission: RE | Admit: 2018-07-17 | Discharge: 2018-07-17 | Disposition: A | Discharge status: Home / Self Care | Payer: BLUE CROSS/BLUE SHIELD | Source: Ambulatory Visit | Attending: Gastroenterology | Admitting: Gastroenterology

## 2018-07-17 ENCOUNTER — Ambulatory Visit: Payer: BLUE CROSS/BLUE SHIELD | Admitting: Anesthesiology

## 2018-07-17 ENCOUNTER — Encounter: Payer: Self-pay | Admitting: *Deleted

## 2018-07-17 DIAGNOSIS — Z87442 Personal history of urinary calculi: Secondary | ICD-10-CM | POA: Insufficient documentation

## 2018-07-17 DIAGNOSIS — D125 Benign neoplasm of sigmoid colon: Secondary | ICD-10-CM

## 2018-07-17 DIAGNOSIS — Z1211 Encounter for screening for malignant neoplasm of colon: Secondary | ICD-10-CM | POA: Insufficient documentation

## 2018-07-17 DIAGNOSIS — Z79899 Other long term (current) drug therapy: Secondary | ICD-10-CM | POA: Insufficient documentation

## 2018-07-17 DIAGNOSIS — D123 Benign neoplasm of transverse colon: Secondary | ICD-10-CM | POA: Insufficient documentation

## 2018-07-17 DIAGNOSIS — F419 Anxiety disorder, unspecified: Secondary | ICD-10-CM | POA: Diagnosis not present

## 2018-07-17 DIAGNOSIS — I10 Essential (primary) hypertension: Secondary | ICD-10-CM | POA: Diagnosis not present

## 2018-07-17 DIAGNOSIS — K219 Gastro-esophageal reflux disease without esophagitis: Secondary | ICD-10-CM | POA: Diagnosis not present

## 2018-07-17 DIAGNOSIS — Z7982 Long term (current) use of aspirin: Secondary | ICD-10-CM | POA: Insufficient documentation

## 2018-07-17 DIAGNOSIS — K635 Polyp of colon: Secondary | ICD-10-CM | POA: Insufficient documentation

## 2018-07-17 DIAGNOSIS — F17210 Nicotine dependence, cigarettes, uncomplicated: Secondary | ICD-10-CM | POA: Diagnosis not present

## 2018-07-17 DIAGNOSIS — D126 Benign neoplasm of colon, unspecified: Secondary | ICD-10-CM | POA: Diagnosis not present

## 2018-07-17 HISTORY — PX: COLONOSCOPY WITH PROPOFOL: SHX5780

## 2018-07-17 LAB — CYTOLOGY - PAP
Diagnosis: NEGATIVE
HPV (WINDOPATH): DETECTED — AB
HPV 16/18/45 genotyping: POSITIVE — AB

## 2018-07-17 SURGERY — COLONOSCOPY WITH PROPOFOL
Anesthesia: General

## 2018-07-17 MED ORDER — MIDAZOLAM HCL 2 MG/2ML IJ SOLN
INTRAMUSCULAR | Status: DC | PRN
  Administered 2018-07-17: 2 mg via INTRAVENOUS

## 2018-07-17 MED ORDER — MIDAZOLAM HCL 2 MG/2ML IJ SOLN
INTRAMUSCULAR | Status: AC
  Filled 2018-07-17: qty 2

## 2018-07-17 MED ORDER — PROPOFOL 500 MG/50ML IV EMUL
INTRAVENOUS | Status: DC | PRN
  Administered 2018-07-17: 120 ug/kg/min via INTRAVENOUS

## 2018-07-17 MED ORDER — FENTANYL CITRATE (PF) 100 MCG/2ML IJ SOLN
INTRAMUSCULAR | Status: AC
  Filled 2018-07-17: qty 2

## 2018-07-17 MED ORDER — FENTANYL CITRATE (PF) 100 MCG/2ML IJ SOLN
INTRAMUSCULAR | Status: DC | PRN
  Administered 2018-07-17: 50 ug via INTRAVENOUS
  Administered 2018-07-17: 25 ug via INTRAVENOUS

## 2018-07-17 MED ORDER — PROPOFOL 500 MG/50ML IV EMUL
INTRAVENOUS | Status: AC
  Filled 2018-07-17: qty 50

## 2018-07-17 MED ORDER — SODIUM CHLORIDE 0.9 % IV SOLN
INTRAVENOUS | Status: DC
  Administered 2018-07-17: 08:00:00 via INTRAVENOUS

## 2018-07-17 NOTE — Anesthesia Postprocedure Evaluation (Signed)
Anesthesia Post Note  Patient: Jamie Williams  Procedure(s) Performed: COLONOSCOPY WITH PROPOFOL (N/A )  Patient location during evaluation: Endoscopy Anesthesia Type: General Level of consciousness: awake and alert Pain management: pain level controlled Vital Signs Assessment: post-procedure vital signs reviewed and stable Respiratory status: spontaneous breathing, nonlabored ventilation, respiratory function stable and patient connected to nasal cannula oxygen Cardiovascular status: blood pressure returned to baseline and stable Postop Assessment: no apparent nausea or vomiting Anesthetic complications: no     Last Vitals:  Vitals:   07/17/18 0912 07/17/18 0942  BP: 121/89 (!) 162/100  Pulse: 68   Resp: (!) 21   Temp: (!) 36.1 C   SpO2: 100%     Last Pain:  Vitals:   07/17/18 0912  TempSrc: Tympanic  PainSc:                  [name]

## 2018-07-17 NOTE — Transfer of Care (Signed)
Immediate Anesthesia Transfer of Care Note  Patient: Jamie Williams  Procedure(s) Performed: COLONOSCOPY WITH PROPOFOL (N/A )  Patient Location: PACU  Anesthesia Type:General  Level of Consciousness: awake and sedated  Airway & Oxygen Therapy: Patient Spontanous Breathing and Patient connected to nasal cannula oxygen  Post-op Assessment: Report given to RN and Post -op Vital signs reviewed and stable  Post vital signs: Reviewed and stable  Last Vitals:  Vitals Value Taken Time  BP    Temp    Pulse    Resp    SpO2      Last Pain:  Vitals:   07/17/18 0804  TempSrc: Tympanic  PainSc: 0-No pain         Complications: No apparent anesthesia complications

## 2018-07-17 NOTE — Op Note (Signed)
[facility]astroenterology Patient Name: Jamie Williams Procedure Date: 07/17/2018 8:49 AM MRN: [id number] Account #: [id number] Date of Birth: 24-Jun-1961 Admit Type: Outpatient Age: 57 Room: [facility] ENDO ROOM 4 Gender: Female Note Status: Finalized Procedure:            Colonoscopy Indications:          Screening for colorectal malignant neoplasm Providers:            Jonathon Bellows MD, MD Referring MD:         Kirstie Peri. Caryn Section, MD (Referring MD) Medicines:            Monitored Anesthesia Care Complications:        No immediate complications. Procedure:            Pre-Anesthesia Assessment:                       - Prior to the procedure, a History and Physical was                        performed, and patient medications, allergies and                        sensitivities were reviewed. The patient's tolerance of                        previous anesthesia was reviewed.                       - The risks and benefits of the procedure and the                        sedation options and risks were discussed with the                        patient. All questions were answered and informed                        consent was obtained.                       - ASA Grade Assessment: II - A patient with mild                        systemic disease.                       After obtaining informed consent, the colonoscope was                        passed under direct vision. Throughout the procedure,                        the patient's blood pressure, pulse, and oxygen                        saturations were monitored continuously. The                        Colonoscope was introduced through the anus and  advanced to the the cecum, identified by the                        appendiceal orifice, IC valve and transillumination.                        The colonoscopy was performed with ease. The patient                        tolerated the procedure well. The  quality of the bowel                        preparation was good. Findings:      The perianal and digital rectal examinations were normal.      Two sessile polyps were found in the transverse colon. The polyps were 4       to 6 mm in size. These polyps were removed with a cold snare. Resection       and retrieval were complete.      Three sessile polyps were found in the sigmoid colon. The polyps were 3       to 5 mm in size. These polyps were removed with a cold snare. Resection       and retrieval were complete.      The exam was otherwise without abnormality on direct and retroflexion       views. Impression:           - Two 4 to 6 mm polyps in the transverse colon, removed                        with a cold snare. Resected and retrieved.                       - Three 3 to 5 mm polyps in the sigmoid colon, removed                        with a cold biopsy forceps. Resected and retrieved.                       - The examination was otherwise normal on direct and                        retroflexion views. Recommendation:       - Discharge patient to home (with escort).                       - Resume previous diet.                       - Continue present medications.                       - Await pathology results.                       - Repeat colonoscopy in 3 - 5 years for surveillance                        based on pathology results. Procedure Code(s):    --- Professional ---  45385, Colonoscopy, flexible; with removal of tumor(s),                        polyp(s), or other lesion(s) by snare technique Diagnosis Code(s):    --- Professional ---                       Z12.11, Encounter for screening for malignant neoplasm                        of colon                       D12.3, Benign neoplasm of transverse colon (hepatic                        flexure or splenic flexure)                       D12.5, Benign neoplasm of sigmoid colon CPT copyright 2017  American Medical Association. All rights reserved. The codes documented in this report are preliminary and upon coder review may  be revised to meet current compliance requirements. Jonathon Bellows, MD Jonathon Bellows MD, MD 07/17/2018 9:11:09 AM This report has been signed electronically. Number of Addenda: 0 Note Initiated On: 07/17/2018 8:49 AM Scope Withdrawal Time: 0 hours 10 minutes 44 seconds  Total Procedure Duration: 0 hours 14 minutes 14 seconds       [facility]

## 2018-07-17 NOTE — Anesthesia Procedure Notes (Signed)
Performed by: Vaughan Sine Pre-anesthesia Checklist: Patient identified, Suction available, Emergency Drugs available, Patient being monitored and Timeout performed Patient Re-evaluated:Patient Re-evaluated prior to induction Oxygen Delivery Method: Nasal cannula Preoxygenation: Pre-oxygenation with 100% oxygen Induction Type: IV induction Placement Confirmation: positive ETCO2 and CO2 detector

## 2018-07-17 NOTE — Anesthesia Preprocedure Evaluation (Signed)
Anesthesia Evaluation  Patient identified by MRN, date of birth, ID band Patient awake    Reviewed: Allergy & Precautions, H&P , NPO status , Patient's Chart, lab work & pertinent test results  History of Anesthesia Complications (+) PONV, Family history of anesthesia reaction and history of anesthetic complications  Airway Mallampati: III  TM Distance: >3 FB Neck ROM: full    Dental  (+) Chipped, Missing, Upper Dentures   Pulmonary neg shortness of breath, COPD, Current Smoker,           Cardiovascular Exercise Tolerance: Good hypertension, (-) angina(-) Past MI and (-) DOE      Neuro/Psych  Headaches, PSYCHIATRIC DISORDERS  Neuromuscular disease    GI/Hepatic negative GI ROS, Neg liver ROS, GERD  Medicated and Controlled,  Endo/Other  negative endocrine ROS  Renal/GU Renal disease  negative genitourinary   Musculoskeletal  (+) Arthritis ,   Abdominal   Peds  Hematology negative hematology ROS (+)   Anesthesia Other Findings Past Medical History: No date: Anxiety No date: Family history of adverse reaction to anesthesia     Comment:  "sister had a difficult time waking up from mastectomy               and passed away"  No date: GERD (gastroesophageal reflux disease) No date: History of chicken pox No date: History of kidney stones No date: Hypertension No date: PONV (postoperative nausea and vomiting)     Comment:  nausea  Past Surgical History: 2011: ABDOMINAL HYSTERECTOMY     Comment:  Abdominal; Ovaries intact; CERVIX INTACT. Fibroids/dys               menorrhea. Weaver-Lee 12/24/2015: ANTERIOR CERVICAL DECOMP/DISCECTOMY FUSION; N/A     Comment:  Procedure: Cervical five-six, Cervical six-seven                Anterior cervical               decompression/diskectomy/fusion/interbody               prosthesis/plate;  Surgeon: Newman Pies, MD;                Location: [place] [facility];  Service:  Neurosurgery;                Laterality: N/A; 1991 and 1996: ECTOPIC PREGNANCY SURGERY     Comment:  two Etopic preg. 2006: Head Injuries     Comment:  surgery 2006 Cram/NS in [place]. Headaches with               increased intracranial pressure. Repeat MRI in 2008               Negative 2004: Ulnar Neuropathy; Right     Comment:  Ulnar Neuropathy surgical revision. 2004-2008. [place].  BMI    Body Mass Index:  28.96 kg/m      Reproductive/Obstetrics negative OB ROS                             Anesthesia Physical Anesthesia Plan  ASA: III  Anesthesia Plan: General   Post-op Pain Management:    Induction: Intravenous  PONV Risk Score and Plan: Propofol infusion and TIVA  Airway Management Planned: Natural Airway and Nasal Cannula  Additional Equipment:   Intra-op Plan:   Post-operative Plan:   Informed Consent: I have  reviewed the patients History and Physical, chart, labs and discussed the procedure including the risks, benefits and alternatives for the proposed anesthesia with the patient or authorized representative who has indicated his/her understanding and acceptance.   Dental Advisory Given  Plan Discussed with: Anesthesiologist, CRNA and Surgeon  Anesthesia Plan Comments: (Patient consented for risks of anesthesia including but not limited to:  - adverse reactions to medications - risk of intubation if required - damage to teeth, lips or other oral mucosa - sore throat or hoarseness - Damage to heart, brain, lungs or loss of life  Patient voiced understanding.)        Anesthesia Quick Evaluation

## 2018-07-17 NOTE — H&P (Signed)
Jamie Bellows, MD [address], [place], [place], [place], [address] [address], [place], [place], [place], [address] Phone: [phone]  Fax: [phone]  Primary Care Physician:  [name], MD   Pre-Procedure History & Physical: HPI:  Jamie Williams is a 57 y.o. female is here for an colonoscopy.   Past Medical History:  Diagnosis Date  . Anxiety   . Family history of adverse reaction to anesthesia    "sister had a difficult time waking up from mastectomy and passed away"   . GERD (gastroesophageal reflux disease)   . History of chicken pox   . History of kidney stones   . Hypertension   . PONV (postoperative nausea and vomiting)    nausea    Past Surgical History:  Procedure Laterality Date  . ABDOMINAL HYSTERECTOMY  2011   Abdominal; Ovaries intact; CERVIX INTACT. Fibroids/dys menorrhea. [place]  . ANTERIOR CERVICAL DECOMP/DISCECTOMY FUSION N/A 12/24/2015   Procedure: Cervical five-six, Cervical six-seven  Anterior cervical decompression/diskectomy/fusion/interbody prosthesis/plate;  Surgeon: Newman Pies, MD;  Location: [facility];  Service: Neurosurgery;  Laterality: N/A;  . [place] and 1996   two Etopic preg.  [place] Kitchen Head Injuries  2006   surgery 2006 Cram/NS in [place]. Headaches with increased intracranial pressure. Repeat MRI in 2008 Negative  . Ulnar Neuropathy Right 2004   Ulnar Neuropathy surgical revision. 2004-2008. [place].    Prior to Admission medications   Medication Sig Start Date End Date Taking? Authorizing Provider  albuterol (PROVENTIL HFA;VENTOLIN HFA) 108 (90 Base) MCG/ACT inhaler Inhale 2 puffs into the lungs every 6 (six) hours as needed for wheezing or shortness of breath. 10/11/17  Yes Carles Collet M, PA-C  amLODipine (NORVASC) 10 MG tablet Take 1 tablet (10 mg total) by mouth daily. 07/11/18  Yes [name], MD  buPROPion [facility] SR) 150 MG 12 hr tablet TAKE 1 TABLET BY MOUTH  TWICE A DAY 05/23/18  Yes [name], MD  cholecalciferol (VITAMIN D) 1000 units tablet Take 1,000 Units by mouth daily.   Yes [provider]  cyclobenzaprine (FLEXERIL) 5 MG tablet Take 1 tablet (5 mg total) by mouth 3 (three) times daily as needed for muscle spasms. 04/17/18  Yes Chauvin, Herbie [place], PA  estradiol (ESTRACE) 1 MG tablet TAKE 1 TABLET (1 MG TOTAL) BY MOUTH DAILY. 06/15/17  Yes [name], MD  gabapentin (NEURONTIN) 300 MG capsule Take 1 capsule (300 mg total) by mouth 3 (three) times daily. 05/10/17  Yes [name], MD  HYDROcodone-acetaminophen (NORCO) 7.5-325 MG tablet Take 1 tablet by mouth every 6 (six) hours as needed for moderate pain. 07/11/18  Yes [name], MD  meloxicam (MOBIC) 15 MG tablet TAKE 1 TABLET BY MOUTH DAILY WITH FOOD 06/27/18  Yes [name], MD  PARoxetine (PAXIL) 30 MG tablet TAKE 2 TABLETS (60 MG TOTAL) BY MOUTH DAILY. 06/18/18  Yes [name], MD  promethazine (PHENERGAN) 25 MG tablet TAKE 1 TABLET EVERY 4 TO 6 HOURS AS NEEDED FOR NAUSEA 03/07/18  Yes [name], MD  ranitidine (ZANTAC) 150 MG tablet RANITIDINE HCL, 150MG  (Oral Tablet)  1 Two Times A Day for heartburn, indigestion, nausea for 0 days  Quantity: 60.00;  Refills: 5   Ordered :22-Jul-2010  Reginia Forts MD;  Started 28-Jan-2010 Active Comments: DX: 787.02 01/28/10  Yes [provider]  simvastatin (ZOCOR) 40 MG tablet Take 1 tablet (40 mg total) by mouth  daily. Patient not taking: Reported on 07/17/2018 01/27/16   [name], MD    Allergies as of 06/30/2018 - Review Complete 06/27/2018  Allergen Reaction Noted  . Aspirin  06/27/2015  . Sulfa antibiotics  06/27/2015  . Penicillins Rash 06/27/2015    Family History  Problem Relation Age of Onset  . Hypertension Mother   . Diabetes Mother        Type 2  . Breast cancer Mother 8  . Cancer Sister 32       Breast  . Diabetes Sister        type 2  . Breast cancer Sister 17  .  Diabetes Sister   . Diabetes Sister   . Diabetes Sister        type 2  . Liver cancer Brother   . Stomach cancer Brother     Social History   Socioeconomic History  . Marital status: Married    Spouse name: Not on file  . Number of children: 1  . Years of education: Not on file  . Highest education level: High school graduate  Occupational History  . Occupation: disabled  Social Needs  . Financial resource strain: Not very hard  . Food insecurity:    Worry: Sometimes true    Inability: Sometimes true  . Transportation needs:    Medical: No    Non-medical: No  Tobacco Use  . Smoking status: Current Every Day Smoker    Packs/day: 0.50  . Smokeless tobacco: Never Used  . Tobacco comment: Started about 57 years old usually 1/2 ppd  Substance and Sexual Activity  . Alcohol use: Not Currently    Alcohol/week: 0.0 standard drinks  . Drug use: Not Currently  . Sexual activity: Not on file  Lifestyle  . Physical activity:    Days per week: Not on file    Minutes per session: Not on file  . Stress: Rather much  Relationships  . Social connections:    Talks on phone: Not on file    Gets together: Not on file    Attends religious service: Not on file    Active member of club or organization: Not on file    Attends meetings of clubs or organizations: Not on file    Relationship status: Not on file  . Intimate partner violence:    Fear of current or ex partner: Not on file    Emotionally abused: Not on file    Physically abused: Not on file    Forced sexual activity: Not on file  Other Topics Concern  . Not on file  Social History Narrative  . Not on file    Review of Systems: See HPI, otherwise negative ROS  Physical Exam: BP (!) 159/95   Pulse 69   Temp (!) 97.4 F (36.3 C) (Tympanic)   Resp 14   Wt 78.9 kg   SpO2 100%   BMI 28.96 kg/m  General:   Alert,  pleasant and cooperative in NAD Head:  Normocephalic and atraumatic. Neck:  Supple; no masses or  thyromegaly. Lungs:  Clear throughout to auscultation, normal respiratory effort.    Heart:  +S1, +S2, Regular rate and rhythm, No edema. Abdomen:  Soft, nontender and nondistended. Normal bowel sounds, without guarding, and without rebound.   Neurologic:  Alert and  oriented x4;  grossly normal neurologically.  Impression/Plan: DALANIE KISNER is here for an colonoscopy to be performed for Screening colonoscopy average risk   Risks, benefits,  limitations, and alternatives regarding  colonoscopy have been reviewed with the patient.  Questions have been answered.  All parties agreeable.   Jamie Bellows, MD  07/17/2018, 8:44 AM

## 2018-07-17 NOTE — Anesthesia Post-op Follow-up Note (Signed)
Anesthesia QCDR form completed.        

## 2018-07-19 ENCOUNTER — Telehealth: Payer: Self-pay | Admitting: *Deleted

## 2018-07-19 ENCOUNTER — Other Ambulatory Visit: Payer: Self-pay | Admitting: Family Medicine

## 2018-07-19 DIAGNOSIS — S149XXA Injury of unspecified nerves of neck, initial encounter: Secondary | ICD-10-CM

## 2018-07-19 LAB — SURGICAL PATHOLOGY

## 2018-07-19 MED ORDER — HYDROCODONE-ACETAMINOPHEN 7.5-325 MG PO TABS: 1.0000 | ORAL_TABLET | Freq: Four times a day (QID) | ORAL | 0 refills | Status: DC | PRN

## 2018-07-19 NOTE — Telephone Encounter (Signed)
LMOVM for pt to return call 

## 2018-07-19 NOTE — Telephone Encounter (Signed)
Pt needs a refill on   Hydrocodone 7.5-325  [facility]  [employer]  Thanks teri

## 2018-07-19 NOTE — Telephone Encounter (Signed)
-----   Message from [name], MD sent at 07/17/2018 12:31 PM EDT ----- Pap is positive for HPV, but there are no pre-cancerous cells. Need pap and repeat HPV test in 1 year.

## 2018-07-19 NOTE — Telephone Encounter (Signed)
Pt returned missed call. Please call pt back at [phone]  Thanks, [facility]

## 2018-07-20 NOTE — Telephone Encounter (Signed)
LMOVM for pt to return call 

## 2018-07-20 NOTE — Telephone Encounter (Signed)
Patient was notified of results. Expressed understanding.  

## 2018-07-23 ENCOUNTER — Encounter: Payer: Self-pay | Admitting: Gastroenterology

## 2018-07-26 ENCOUNTER — Other Ambulatory Visit: Payer: Self-pay | Admitting: Family Medicine

## 2018-07-26 DIAGNOSIS — S149XXA Injury of unspecified nerves of neck, initial encounter: Secondary | ICD-10-CM

## 2018-07-26 MED ORDER — HYDROCODONE-ACETAMINOPHEN 7.5-325 MG PO TABS: 1.0000 | ORAL_TABLET | Freq: Four times a day (QID) | ORAL | 0 refills | Status: DC | PRN

## 2018-07-26 NOTE — Telephone Encounter (Signed)
Pt needing a refill on: °HYDROcodone-acetaminophen (NORCO) 7.5-325 MG tablet ° °Please fill at: ° ° °[id number] [place], [place] - [address] [phone] (Phone) [phone] (Fax)  ° ° °Thanks, °[facility] °

## 2018-08-03 ENCOUNTER — Other Ambulatory Visit: Payer: Self-pay | Admitting: Family Medicine

## 2018-08-03 DIAGNOSIS — S149XXA Injury of unspecified nerves of neck, initial encounter: Secondary | ICD-10-CM

## 2018-08-03 MED ORDER — HYDROCODONE-ACETAMINOPHEN 7.5-325 MG PO TABS: 1.0000 | ORAL_TABLET | Freq: Four times a day (QID) | ORAL | 0 refills | Status: DC | PRN

## 2018-08-03 NOTE — Telephone Encounter (Signed)
Please review. Thanks!  

## 2018-08-03 NOTE — Telephone Encounter (Signed)
Pt states she is out of medication and needs a refill of HYDROcodone-acetaminophen (NORCO) 7.5-325 MG tablet  [facility] [place]

## 2018-08-10 ENCOUNTER — Other Ambulatory Visit: Payer: Self-pay | Admitting: Family Medicine

## 2018-08-10 DIAGNOSIS — S149XXA Injury of unspecified nerves of neck, initial encounter: Secondary | ICD-10-CM

## 2018-08-10 NOTE — Telephone Encounter (Signed)
Please advise 

## 2018-08-10 NOTE — Telephone Encounter (Signed)
Patient needs refill on Hydrocodone 7.5 mg.-325 mg.  [facility] [place].

## 2018-08-11 MED ORDER — HYDROCODONE-ACETAMINOPHEN 7.5-325 MG PO TABS: 1.0000 | ORAL_TABLET | Freq: Four times a day (QID) | ORAL | 0 refills | Status: DC | PRN

## 2018-08-17 ENCOUNTER — Other Ambulatory Visit: Payer: Self-pay | Admitting: Family Medicine

## 2018-08-17 DIAGNOSIS — S149XXA Injury of unspecified nerves of neck, initial encounter: Secondary | ICD-10-CM

## 2018-08-17 MED ORDER — HYDROCODONE-ACETAMINOPHEN 7.5-325 MG PO TABS: 1.0000 | ORAL_TABLET | Freq: Four times a day (QID) | ORAL | 0 refills | Status: DC | PRN

## 2018-08-17 NOTE — Telephone Encounter (Signed)
Pt needing a refill on: °HYDROcodone-acetaminophen (NORCO) 7.5-325 MG tablet ° °Please fill at: ° ° °[id number] [place], [place] - [address] [phone] (Phone) °[phone] (Fax)  ° ° °Thanks, °[facility] °

## 2018-08-17 NOTE — Telephone Encounter (Signed)
Medication was just filled 08/11/18. KW

## 2018-08-25 ENCOUNTER — Other Ambulatory Visit: Payer: Self-pay | Admitting: Family Medicine

## 2018-08-25 DIAGNOSIS — S149XXA Injury of unspecified nerves of neck, initial encounter: Secondary | ICD-10-CM

## 2018-08-25 MED ORDER — HYDROCODONE-ACETAMINOPHEN 7.5-325 MG PO TABS: 1.0000 | ORAL_TABLET | Freq: Four times a day (QID) | ORAL | 0 refills | Status: DC | PRN

## 2018-08-25 NOTE — Telephone Encounter (Signed)
Pt needing refill on: °HYDROcodone-acetaminophen (NORCO) 7.5-325 MG tablet ° °Please fill at: ° ° °[id number] [place], [place] - [address] [phone] (Phone) °[phone] (Fax)  ° ° °Thanks, °[facility] ° °

## 2018-09-01 ENCOUNTER — Other Ambulatory Visit: Payer: Self-pay | Admitting: Family Medicine

## 2018-09-01 DIAGNOSIS — S149XXA Injury of unspecified nerves of neck, initial encounter: Secondary | ICD-10-CM

## 2018-09-01 NOTE — Telephone Encounter (Signed)
Requesting refill on:  HYDROcodone-acetaminophen ([place]) 7.5-325 MG tablet   Please fill at:  [id number] - [place], [place] - [address] [phone] (Phone) [phone] (Fax)   Thanks, [facility]

## 2018-09-01 NOTE — Telephone Encounter (Signed)
Please review. Thanks!  

## 2018-09-02 MED ORDER — HYDROCODONE-ACETAMINOPHEN 7.5-325 MG PO TABS: 1.0000 | ORAL_TABLET | Freq: Four times a day (QID) | ORAL | 0 refills | Status: DC | PRN

## 2018-09-11 ENCOUNTER — Other Ambulatory Visit: Payer: Self-pay | Admitting: Family Medicine

## 2018-09-11 DIAGNOSIS — S149XXA Injury of unspecified nerves of neck, initial encounter: Secondary | ICD-10-CM

## 2018-09-11 MED ORDER — HYDROCODONE-ACETAMINOPHEN 7.5-325 MG PO TABS: 1.0000 | ORAL_TABLET | Freq: Four times a day (QID) | ORAL | 0 refills | Status: DC | PRN

## 2018-09-11 NOTE — Telephone Encounter (Signed)
Pt needing refill on: HYDROcodone-acetaminophen (NORCO) 7.5-325 MG tablet  Please fill at:   [id number] - , [place] - [address] [phone] (Phone) [phone] (Fax)    Thanks, [employer]

## 2018-09-16 ENCOUNTER — Other Ambulatory Visit: Payer: Self-pay | Admitting: Family Medicine

## 2018-09-16 DIAGNOSIS — F329 Major depressive disorder, single episode, unspecified: Secondary | ICD-10-CM

## 2018-09-16 DIAGNOSIS — F32A Depression, unspecified: Secondary | ICD-10-CM

## 2018-09-19 ENCOUNTER — Other Ambulatory Visit: Payer: Self-pay

## 2018-09-19 DIAGNOSIS — S149XXA Injury of unspecified nerves of neck, initial encounter: Secondary | ICD-10-CM

## 2018-09-19 MED ORDER — HYDROCODONE-ACETAMINOPHEN 7.5-325 MG PO TABS: 1.0000 | ORAL_TABLET | Freq: Four times a day (QID) | ORAL | 0 refills | Status: DC | PRN

## 2018-10-02 ENCOUNTER — Other Ambulatory Visit: Payer: Self-pay | Admitting: Family Medicine

## 2018-10-02 DIAGNOSIS — N951 Menopausal and female climacteric states: Secondary | ICD-10-CM

## 2018-10-02 DIAGNOSIS — S149XXA Injury of unspecified nerves of neck, initial encounter: Secondary | ICD-10-CM

## 2018-10-02 NOTE — Telephone Encounter (Signed)
Pt needing a refill on: °HYDROcodone-acetaminophen (NORCO) 7.5-325 MG tablet ° °Please fill at: ° ° °[id number] [place], [place] - [address] [phone] (Phone) °[phone] (Fax)  ° ° °Thanks, °[facility] °

## 2018-10-03 MED ORDER — HYDROCODONE-ACETAMINOPHEN 7.5-325 MG PO TABS: 1.0000 | ORAL_TABLET | Freq: Four times a day (QID) | ORAL | 0 refills | Status: DC | PRN

## 2018-10-10 ENCOUNTER — Ambulatory Visit: Payer: Self-pay | Admitting: Family Medicine

## 2018-10-10 ENCOUNTER — Other Ambulatory Visit: Payer: Self-pay | Admitting: Family Medicine

## 2018-10-10 DIAGNOSIS — S149XXA Injury of unspecified nerves of neck, initial encounter: Secondary | ICD-10-CM

## 2018-10-10 NOTE — Telephone Encounter (Signed)
Pt needing a refill on: °HYDROcodone-acetaminophen (NORCO) 7.5-325 MG tablet ° °Please fill at: ° ° °[id number] [place], [place] - [address] [phone] (Phone) °[phone] (Fax)  ° ° °Thanks, °[facility] °

## 2018-10-10 NOTE — Telephone Encounter (Signed)
Pt did not come for her appointment this morning.

## 2018-10-11 MED ORDER — HYDROCODONE-ACETAMINOPHEN 7.5-325 MG PO TABS: 1.0000 | ORAL_TABLET | Freq: Four times a day (QID) | ORAL | 0 refills | Status: DC | PRN

## 2018-10-11 NOTE — Telephone Encounter (Signed)
Have sent prescription, but she needs to schedule follow up o.v. for meds and BP within the next month.

## 2018-10-11 NOTE — Telephone Encounter (Signed)
Patient advised. Appointment scheduled 10/20/2018 at 11am.

## 2018-10-20 ENCOUNTER — Ambulatory Visit
Admission: RE | Admit: 2018-10-20 | Discharge: 2018-10-20 | Disposition: A | Discharge status: Home / Self Care | Payer: BLUE CROSS/BLUE SHIELD | Source: Ambulatory Visit | Attending: Family Medicine | Admitting: Family Medicine

## 2018-10-20 ENCOUNTER — Encounter: Payer: Self-pay | Admitting: Family Medicine

## 2018-10-20 ENCOUNTER — Ambulatory Visit
Admission: RE | Admit: 2018-10-20 | Discharge: 2018-10-20 | Disposition: A | Discharge status: Home / Self Care | Payer: BLUE CROSS/BLUE SHIELD | Attending: Family Medicine | Admitting: Family Medicine

## 2018-10-20 ENCOUNTER — Ambulatory Visit (INDEPENDENT_AMBULATORY_CARE_PROVIDER_SITE_OTHER): Payer: BLUE CROSS/BLUE SHIELD | Admitting: Family Medicine

## 2018-10-20 VITALS — BP 151/95 | HR 77 | Temp 97.6°F | Resp 16 | Wt 178.0 lb

## 2018-10-20 DIAGNOSIS — G8929 Other chronic pain: Secondary | ICD-10-CM

## 2018-10-20 DIAGNOSIS — M5442 Lumbago with sciatica, left side: Secondary | ICD-10-CM

## 2018-10-20 DIAGNOSIS — I10 Essential (primary) hypertension: Secondary | ICD-10-CM | POA: Diagnosis not present

## 2018-10-20 DIAGNOSIS — M545 Low back pain: Secondary | ICD-10-CM | POA: Insufficient documentation

## 2018-10-20 DIAGNOSIS — M542 Cervicalgia: Secondary | ICD-10-CM

## 2018-10-20 DIAGNOSIS — R7303 Prediabetes: Secondary | ICD-10-CM

## 2018-10-20 DIAGNOSIS — M4722 Other spondylosis with radiculopathy, cervical region: Secondary | ICD-10-CM

## 2018-10-20 LAB — POCT GLYCOSYLATED HEMOGLOBIN (HGB A1C)
Est. average glucose Bld gHb Est-mCnc: 120
Hemoglobin A1C: 5.8 % — AB (ref 4.0–5.6)

## 2018-10-20 MED ORDER — PROMETHAZINE HCL 25 MG PO TABS: ORAL_TABLET | ORAL | 5 refills | Status: DC

## 2018-10-20 NOTE — Patient Instructions (Addendum)
.   Please bring all of your medications to every appointment so we can make sure that our medication list is the same as yours.   . Go to the [facility] on [address] for back Xray   

## 2018-10-20 NOTE — Progress Notes (Signed)
Patient: Jamie Williams Female    DOB: 02-16-1961   57 y.o.   MRN: [id number] Visit Date: 10/20/2018  Today's Provider: Lelon Huh, MD   Chief Complaint  Patient presents with  . Hypertension  . Hyperglycemia  . Hyperlipidemia   Subjective:     HPI  Hypertension, follow-up:  BP Readings from Last 3 Encounters:  10/20/18 (!) 151/95  07/17/18 (!) 162/100  07/11/18 (!) 151/92    She was last seen for hypertension 3 months ago.  BP at that visit was 151/92. Management since that visit includes no changes. She reports good compliance with treatment. She is not having side effects.  She is not exercising. She is not adherent to low salt diet.   Outside blood pressures are 140/80's. She is experiencing none.  Patient denies chest pain, chest pressure/discomfort, claudication, dyspnea, exertional chest pressure/discomfort, fatigue, irregular heart beat, lower extremity edema, near-syncope, orthopnea, palpitations, paroxysmal nocturnal dyspnea, syncope and tachypnea.   Cardiovascular risk factors include dyslipidemia and hypertension.  Use of agents associated with hypertension: none.     Weight trend: fluctuating a bit Wt Readings from Last 3 Encounters:  10/20/18 178 lb (80.7 kg)  07/17/18 174 lb (78.9 kg)  07/11/18 174 lb (78.9 kg)    Current diet: well balanced  ------------------------------------------------------------------------  Prediabetes, Follow-up:   Lab Results  Component Value Date   HGBA1C 5.8 (H) 03/07/2018   HGBA1C 5.9 (H) 11/14/2017   HGBA1C 6.2 (H) 01/26/2016   GLUCOSE 106 (H) 02/22/2018   GLUCOSE 122 (H) 11/14/2017   GLUCOSE 107 (H) 01/10/2017    Last seen for for this7 months ago.  Management since that visit includes no changes. Current symptoms include none and have been stable.  Weight trend: fluctuating a bit Prior visit with dietician: no Current diet: well balanced Current exercise: none  Pertinent Labs:      Component Value Date/Time   CHOL 195 03/07/2018 0938   TRIG 80 03/07/2018 0938   CHOLHDL 3.9 03/07/2018 0938   CREATININE 0.98 02/22/2018 1155   CREATININE 0.93 11/23/2014 1612    Wt Readings from Last 3 Encounters:  10/20/18 178 lb (80.7 kg)  07/17/18 174 lb (78.9 kg)  07/11/18 174 lb (78.9 kg)    Lipid/Cholesterol, Follow-up:   Last seen for this7 months ago.  Management changes since that visit include none. . Last Lipid Panel:    Component Value Date/Time   CHOL 195 03/07/2018 0938   TRIG 80 03/07/2018 0938   HDL 50 03/07/2018 0938   CHOLHDL 3.9 03/07/2018 0938   LDLCALC 129 (H) 03/07/2018 4401    Risk factors for vascular disease include hypercholesterolemia and hypertension  She reports good compliance with treatment. She is not having side effects.  Current symptoms include none and have been stable. Weight trend: fluctuating a bit Prior visit with dietician: no Current diet: well balanced Current exercise: none  Wt Readings from Last 3 Encounters:  10/20/18 178 lb (80.7 kg)  07/17/18 174 lb (78.9 kg)  07/11/18 174 lb (78.9 kg)    ------------------------------------------------------------------- Vitamin D Deficiency: Patient was last seen for this problem 7 months ago. Management during that visit includes advising patient to take supplements daily. Patient reports good compliance with treatment.  Lab Results  Component Value Date   VD25OH 25.7 (L) 03/07/2018   Has been having intermittent pains in left lower back radiating into left buttocks and legs the last couple of months. Occurs for a few days every  month or two.  Still having daily neck and upper arm pain, which is well controlled taking hydrocodone/apap 3-4 times a day.     Allergies  Allergen Reactions  . Aspirin Nausea And Vomiting  . Penicillins Rash  . Sulfa Antibiotics Hives     Current Outpatient Medications:  .  albuterol (PROVENTIL HFA;VENTOLIN HFA) 108 (90 Base) MCG/ACT  inhaler, Inhale 2 puffs into the lungs every 6 (six) hours as needed for wheezing or shortness of breath., Disp: 1 Inhaler, Rfl: 2 .  amLODipine (NORVASC) 10 MG tablet, Take 1 tablet (10 mg total) by mouth daily., Disp: 30 tablet, Rfl: 11 .  buPROPion (WELLBUTRIN SR) 150 MG 12 hr tablet, TAKE 1 TABLET BY MOUTH TWICE A DAY, Disp: 180 tablet, Rfl: 4 .  cholecalciferol (VITAMIN D) 1000 units tablet, Take 1,000 Units by mouth daily., Disp: , Rfl:  .  cyclobenzaprine (FLEXERIL) 5 MG tablet, Take 1 tablet (5 mg total) by mouth 3 (three) times daily as needed for muscle spasms., Disp: 21 tablet, Rfl: 0 .  estradiol (ESTRACE) 1 MG tablet, TAKE 1 TABLET (1 MG TOTAL) BY MOUTH DAILY., Disp: 30 tablet, Rfl: 5 .  gabapentin (NEURONTIN) 300 MG capsule, Take 1 capsule (300 mg total) by mouth 3 (three) times daily., Disp: 90 capsule, Rfl: 3 .  HYDROcodone-acetaminophen (NORCO) 7.5-325 MG tablet, Take 1 tablet by mouth every 6 (six) hours as needed for moderate pain., Disp: 30 tablet, Rfl: 0 .  meloxicam (MOBIC) 15 MG tablet, TAKE 1 TABLET BY MOUTH DAILY WITH FOOD, Disp: 30 tablet, Rfl: 5 .  PARoxetine (PAXIL) 30 MG tablet, TAKE 2 TABLETS (60 MG TOTAL) BY MOUTH DAILY., Disp: 180 tablet, Rfl: 4 .  promethazine (PHENERGAN) 25 MG tablet, TAKE 1 TABLET EVERY 4 TO 6 HOURS AS NEEDED FOR NAUSEA, Disp: 30 tablet, Rfl: 5 .  ranitidine (ZANTAC) 150 MG tablet, RANITIDINE HCL, 150MG  (Oral Tablet)  1 Two Times A Day for heartburn, indigestion, nausea for 0 days  Quantity: 60.00;  Refills: 5   Ordered :22-Jul-2010  Reginia Forts MD;  Buddy Duty 28-Jan-2010 Active Comments: DX: 787.02, Disp: , Rfl:  .  simvastatin (ZOCOR) 40 MG tablet, Take 1 tablet (40 mg total) by mouth daily., Disp: 30 tablet, Rfl: 6  Review of Systems  Constitutional: Negative for appetite change, chills, fatigue and fever.  Respiratory: Negative for chest tightness and shortness of breath.   Cardiovascular: Negative for chest pain and palpitations.    Gastrointestinal: Negative for abdominal pain, nausea and vomiting.  Neurological: Negative for dizziness and weakness.    Social History   Tobacco Use  . Smoking status: Current Every Day Smoker    Packs/day: 0.50  . Smokeless tobacco: Never Used  . Tobacco comment: Started about 57 years old usually 1/2 ppd  Substance Use Topics  . Alcohol use: Not Currently    Alcohol/week: 0.0 standard drinks      Objective:   BP (!) 151/95 (BP Location: Right Arm, Cuff Size: Large)   Pulse 77   Temp 97.6 F (36.4 C) (Oral)   Resp 16   Wt 178 lb (80.7 kg)   SpO2 98% Comment: room air  BMI 29.62 kg/m  Vitals:   10/20/18 1105 10/20/18 1107  BP: (!) 157/86 (!) 151/95  Pulse: 77   Resp: 16   Temp: 97.6 F (36.4 C)   TempSrc: Oral   SpO2: 98%   Weight: 178 lb (80.7 kg)      Physical Exam   General Appearance:  Alert, cooperative, no distress  Eyes:    PERRL, conjunctiva/corneas clear, EOM's intact       Lungs:     Clear to auscultation bilaterally, respirations unlabored  Heart:    Regular rate and rhythm      Results for orders placed or performed in visit on 10/20/18  POCT HgB A1C  Result Value Ref Range   Hemoglobin A1C 5.8 (A) 4.0 - 5.6 %   HbA1c POC (<> result, manual entry)     HbA1c, POC (prediabetic range)     HbA1c, POC (controlled diabetic range)     Est. average glucose Bld gHb Est-mCnc 120         Assessment & Plan    1. Pre-diabetes Well controlled with diet.  - POCT HgB A1C  2. Chronic left-sided low back pain with left-sided sciatica  - DG Lumbar Spine Complete; Future  3. Benign essential HTN SBP elevated, but she reports she has been very anxious. Work on diet. If not improved at follow up advised will need to add another BP medications.   4. Cervical spondylosis with radiculopathy   5. Neck pain Pain fairly well controlled on current medications. Drug contract signed today.   Follow up 5 months labs and BP check.     Lelon Huh, MD  [facility]

## 2018-10-23 ENCOUNTER — Encounter: Payer: Self-pay | Admitting: Family Medicine

## 2018-10-23 DIAGNOSIS — I700 Atherosclerosis of aorta: Secondary | ICD-10-CM | POA: Insufficient documentation

## 2018-10-24 ENCOUNTER — Other Ambulatory Visit: Payer: Self-pay

## 2018-10-24 DIAGNOSIS — S149XXA Injury of unspecified nerves of neck, initial encounter: Secondary | ICD-10-CM

## 2018-10-24 NOTE — Telephone Encounter (Signed)
Pt advised.  She also needs a [facility]ill [facility] [address]   Thanks,   -Mickel Baas

## 2018-10-24 NOTE — Telephone Encounter (Signed)
-----   Message from [name], MD sent at 10/23/2018  1:19 PM EST ----- Xray shows degenerative disk disease in lower spine which is causing sciatica. If pain becomes more persistent then would recommend physical therapy.

## 2018-10-25 ENCOUNTER — Other Ambulatory Visit: Payer: Self-pay | Admitting: Family Medicine

## 2018-10-25 DIAGNOSIS — N951 Menopausal and female climacteric states: Secondary | ICD-10-CM

## 2018-10-26 MED ORDER — HYDROCODONE-ACETAMINOPHEN 7.5-325 MG PO TABS: 1.0000 | ORAL_TABLET | Freq: Four times a day (QID) | ORAL | 0 refills | Status: DC | PRN

## 2018-11-03 ENCOUNTER — Other Ambulatory Visit: Payer: Self-pay

## 2018-11-03 DIAGNOSIS — S149XXA Injury of unspecified nerves of neck, initial encounter: Secondary | ICD-10-CM

## 2018-11-03 NOTE — Telephone Encounter (Signed)
Patient called requesting refills on medication. Pharmacy listed is correct. Thanks!

## 2018-11-04 MED ORDER — HYDROCODONE-ACETAMINOPHEN 7.5-325 MG PO TABS: 1.0000 | ORAL_TABLET | Freq: Four times a day (QID) | ORAL | 0 refills | Status: DC | PRN

## 2018-11-15 ENCOUNTER — Other Ambulatory Visit: Payer: Self-pay | Admitting: Family Medicine

## 2018-11-15 DIAGNOSIS — S149XXA Injury of unspecified nerves of neck, initial encounter: Secondary | ICD-10-CM

## 2018-11-15 MED ORDER — HYDROCODONE-ACETAMINOPHEN 7.5-325 MG PO TABS: 1.0000 | ORAL_TABLET | Freq: Four times a day (QID) | ORAL | 0 refills | Status: DC | PRN

## 2018-11-15 NOTE — Telephone Encounter (Signed)
Patient needs refill on Hydrocodone 7.22m - 325 mg. [facility] [place].

## 2018-11-21 ENCOUNTER — Telehealth: Payer: Self-pay | Admitting: Family Medicine

## 2018-11-21 DIAGNOSIS — S149XXA Injury of unspecified nerves of neck, initial encounter: Secondary | ICD-10-CM

## 2018-11-21 NOTE — Telephone Encounter (Signed)
Pt calling regarding her HYDROcodone-acetaminophen (NORCO) 7.5-325 MG tablet.  The pharmacy is saying the insurance company is needing a prior aurth to fill the medication.  Please advise.  Thanks, [employer]

## 2018-11-22 NOTE — Telephone Encounter (Signed)
Please call pharmacy and have then send info for prio auth.

## 2018-11-22 NOTE — Telephone Encounter (Signed)
In cover my meds

## 2018-11-30 ENCOUNTER — Other Ambulatory Visit: Payer: Self-pay | Admitting: Family Medicine

## 2018-11-30 DIAGNOSIS — S149XXA Injury of unspecified nerves of neck, initial encounter: Secondary | ICD-10-CM

## 2018-11-30 NOTE — Telephone Encounter (Signed)
Pt needing a refill on: °HYDROcodone-acetaminophen (NORCO) 7.5-325 MG tablet ° °Please fill at: ° ° °[id number] [place], [place] - [address] [phone] (Phone) °[phone] (Fax)  ° ° °Thanks, °[facility] °

## 2018-12-01 MED ORDER — HYDROCODONE-ACETAMINOPHEN 7.5-325 MG PO TABS: 1.0000 | ORAL_TABLET | Freq: Four times a day (QID) | ORAL | 0 refills | Status: DC | PRN

## 2018-12-08 ENCOUNTER — Other Ambulatory Visit: Payer: Self-pay | Admitting: Family Medicine

## 2018-12-08 DIAGNOSIS — S149XXA Injury of unspecified nerves of neck, initial encounter: Secondary | ICD-10-CM

## 2018-12-08 NOTE — Telephone Encounter (Signed)
°  Pt needing a refill on: °HYDROcodone-acetaminophen (NORCO) 7.5-325 MG tablet ° °Please fill at: ° ° °[id number] [place], [place] - [address] [phone] (Phone) °[phone] (Fax)  ° ° °Thanks, °[facility] °

## 2018-12-13 MED ORDER — HYDROCODONE-ACETAMINOPHEN 7.5-325 MG PO TABS: 1.0000 | ORAL_TABLET | Freq: Four times a day (QID) | ORAL | 0 refills | Status: DC | PRN

## 2018-12-13 NOTE — Telephone Encounter (Signed)
Pt check in refill on  HYDROcodone-acetaminophen (NORCO) 7.5-325 MG tablet  Please advise.  Thanks, [employer]

## 2018-12-22 ENCOUNTER — Other Ambulatory Visit: Payer: Self-pay | Admitting: Family Medicine

## 2018-12-22 DIAGNOSIS — S149XXA Injury of unspecified nerves of neck, initial encounter: Secondary | ICD-10-CM

## 2018-12-22 MED ORDER — HYDROCODONE-ACETAMINOPHEN 7.5-325 MG PO TABS: 1.0000 | ORAL_TABLET | Freq: Four times a day (QID) | ORAL | 0 refills | Status: DC | PRN

## 2018-12-22 NOTE — Telephone Encounter (Signed)
Please review. Thanks!  

## 2018-12-22 NOTE — Telephone Encounter (Signed)
Pt needing a refill on: °HYDROcodone-acetaminophen (NORCO) 7.5-325 MG tablet ° °Please fill at: ° ° °[id number] [place], [place] - [address] [phone] (Phone) °[phone] (Fax)  ° ° °Thanks, °[facility] °

## 2019-01-01 ENCOUNTER — Other Ambulatory Visit: Payer: Self-pay | Admitting: Family Medicine

## 2019-01-01 DIAGNOSIS — S149XXA Injury of unspecified nerves of neck, initial encounter: Secondary | ICD-10-CM

## 2019-01-01 NOTE — Telephone Encounter (Signed)
Pt needs a refill on her   Hydrocodone 7.5-325  [facility]  [employer]  Thanks  Con Memos

## 2019-01-02 MED ORDER — HYDROCODONE-ACETAMINOPHEN 7.5-325 MG PO TABS: 1.0000 | ORAL_TABLET | Freq: Four times a day (QID) | ORAL | 0 refills | Status: DC | PRN

## 2019-01-08 ENCOUNTER — Other Ambulatory Visit: Payer: Self-pay | Admitting: Family Medicine

## 2019-01-09 ENCOUNTER — Other Ambulatory Visit: Payer: Self-pay | Admitting: Family Medicine

## 2019-01-09 DIAGNOSIS — S149XXA Injury of unspecified nerves of neck, initial encounter: Secondary | ICD-10-CM

## 2019-01-09 NOTE — Telephone Encounter (Signed)
Pt needing a refill on: °HYDROcodone-acetaminophen (NORCO) 7.5-325 MG tablet ° °Please fill at: ° ° °[id number] [place], [place] - [address] [phone] (Phone) °[phone] (Fax)  ° ° °Thanks, °[facility] °

## 2019-01-10 MED ORDER — HYDROCODONE-ACETAMINOPHEN 7.5-325 MG PO TABS: 1.0000 | ORAL_TABLET | Freq: Four times a day (QID) | ORAL | 0 refills | Status: DC | PRN

## 2019-01-16 ENCOUNTER — Other Ambulatory Visit: Payer: Self-pay

## 2019-01-16 DIAGNOSIS — S149XXA Injury of unspecified nerves of neck, initial encounter: Secondary | ICD-10-CM

## 2019-01-16 NOTE — Telephone Encounter (Signed)
Patient is requesting a refill on her Norco 7.5-325 MG tablets. Patient states she picked up the medication that was order on 01/10/2019. Please advise.

## 2019-01-17 MED ORDER — HYDROCODONE-ACETAMINOPHEN 7.5-325 MG PO TABS: 1.0000 | ORAL_TABLET | Freq: Four times a day (QID) | ORAL | 0 refills | Status: DC | PRN

## 2019-01-25 ENCOUNTER — Other Ambulatory Visit: Payer: Self-pay

## 2019-01-25 DIAGNOSIS — S149XXA Injury of unspecified nerves of neck, initial encounter: Secondary | ICD-10-CM

## 2019-01-26 MED ORDER — HYDROCODONE-ACETAMINOPHEN 7.5-325 MG PO TABS: 1.0000 | ORAL_TABLET | Freq: Four times a day (QID) | ORAL | 0 refills | Status: DC | PRN

## 2019-02-06 ENCOUNTER — Other Ambulatory Visit: Payer: Self-pay

## 2019-02-06 DIAGNOSIS — S149XXA Injury of unspecified nerves of neck, initial encounter: Secondary | ICD-10-CM

## 2019-02-06 NOTE — Telephone Encounter (Signed)
Patient called requesting refill

## 2019-02-07 MED ORDER — HYDROCODONE-ACETAMINOPHEN 7.5-325 MG PO TABS: 1.0000 | ORAL_TABLET | Freq: Four times a day (QID) | ORAL | 0 refills | Status: DC | PRN

## 2019-02-20 ENCOUNTER — Other Ambulatory Visit: Payer: Self-pay | Admitting: Family Medicine

## 2019-02-20 DIAGNOSIS — S149XXA Injury of unspecified nerves of neck, initial encounter: Secondary | ICD-10-CM

## 2019-02-20 NOTE — Telephone Encounter (Signed)
Needs refill on   Hydrocodone 7.5-325  [facility] [place]  CB#  [phone]  thanks teri

## 2019-02-20 NOTE — Telephone Encounter (Signed)
Please advise 

## 2019-02-22 MED ORDER — HYDROCODONE-ACETAMINOPHEN 7.5-325 MG PO TABS: 1.0000 | ORAL_TABLET | Freq: Four times a day (QID) | ORAL | 0 refills | Status: DC | PRN

## 2019-03-09 ENCOUNTER — Other Ambulatory Visit: Payer: Self-pay

## 2019-03-09 DIAGNOSIS — S149XXA Injury of unspecified nerves of neck, initial encounter: Secondary | ICD-10-CM

## 2019-03-09 MED ORDER — HYDROCODONE-ACETAMINOPHEN 7.5-325 MG PO TABS: 1.0000 | ORAL_TABLET | Freq: Four times a day (QID) | ORAL | 0 refills | Status: DC | PRN

## 2019-03-21 ENCOUNTER — Encounter: Payer: Self-pay | Admitting: Family Medicine

## 2019-03-21 ENCOUNTER — Ambulatory Visit (INDEPENDENT_AMBULATORY_CARE_PROVIDER_SITE_OTHER): Payer: BLUE CROSS/BLUE SHIELD | Admitting: Family Medicine

## 2019-03-21 ENCOUNTER — Other Ambulatory Visit: Payer: Self-pay

## 2019-03-21 VITALS — BP 136/76 | HR 82 | Temp 98.4°F | Resp 16 | Ht 65.0 in | Wt 183.0 lb

## 2019-03-21 DIAGNOSIS — R7303 Prediabetes: Secondary | ICD-10-CM | POA: Diagnosis not present

## 2019-03-21 DIAGNOSIS — I10 Essential (primary) hypertension: Secondary | ICD-10-CM

## 2019-03-21 DIAGNOSIS — N951 Menopausal and female climacteric states: Secondary | ICD-10-CM

## 2019-03-21 MED ORDER — ESTRADIOL 1 MG PO TABS: 2.0000 mg | ORAL_TABLET | Freq: Every day | ORAL | 0 refills | Status: DC

## 2019-03-21 NOTE — Progress Notes (Signed)
Patient: Jamie Williams Female    DOB: [birth year]   58 y.o.   MRN: [id number] Visit Date: 03/21/2019  Today's Provider: Lelon Huh, MD   Chief Complaint  Patient presents with  . Hypertension  . Hyperglycemia   Subjective:     HPI  Hypertension, follow-up:  BP Readings from Last 3 Encounters:  03/21/19 136/76  10/20/18 (!) 151/95  07/17/18 (!) 162/100    She was last seen for hypertension 5 months ago.  BP at that visit was 151/95. Management since that visit includes work on diet and exercise. She reports fair compliance with treatment. She is not having side effects.  She is exercising. She is adherent to low salt diet.   Outside blood pressures are checked occasionally. She is experiencing none.  Patient denies exertional chest pressure/discomfort, lower extremity edema and palpitations.    Weight trend: stable Wt Readings from Last 3 Encounters:  03/21/19 183 lb (83 kg)  10/20/18 178 lb (80.7 kg)  07/17/18 174 lb (78.9 kg)    Current diet: well balanced    Prediabetes, Follow-up:   Lab Results  Component Value Date   HGBA1C 5.8 (A) 10/20/2018   HGBA1C 5.8 (H) 03/07/2018   HGBA1C 5.9 (H) 11/14/2017   GLUCOSE 106 (H) 02/22/2018   GLUCOSE 122 (H) 11/14/2017   GLUCOSE 107 (H) 01/10/2017    Last seen for for this 5 months ago.  Management since that visit includes no changes. Current symptoms include none and have been stable.  Pertinent Labs:    Component Value Date/Time   CHOL 195 03/07/2018 0938   TRIG 80 03/07/2018 0938   CHOLHDL 3.9 03/07/2018 0938   CREATININE 0.98 02/22/2018 1155   CREATININE 0.93 11/23/2014 1612   She also reports today, that she is having more frequent and severe hot flashes.  Past Surgical History:  Procedure Laterality Date  . ABDOMINAL HYSTERECTOMY  2011   Abdominal; Ovaries intact; CERVIX INTACT. Fibroids/dys menorrhea. Weaver-Lee  . ANTERIOR CERVICAL DECOMP/DISCECTOMY FUSION N/A 12/24/2015   Procedure:  Cervical five-six, Cervical six-seven  Anterior cervical decompression/diskectomy/fusion/interbody prosthesis/plate;  Surgeon: Newman Pies, MD;  Location: [facility];  Service: Neurosurgery;  Laterality: N/A;  . COLONOSCOPY WITH PROPOFOL N/A 07/17/2018   Procedure: COLONOSCOPY WITH PROPOFOL;  Surgeon: Jonathon Bellows, MD;  Location: [facility];  Service: Gastroenterology;  Laterality: N/A;  . [place] and 1996   two Etopic preg.  [place] Head Injuries  2006   surgery 2006 Cram/NS in [place]. Headaches with increased intracranial pressure. Repeat MRI in 2008 Negative  . Ulnar Neuropathy Right 2004   Ulnar Neuropathy surgical revision. 2004-2008. [place].   She has been taking 1mg  estrace with she states helped at first, but doesn't seem to be working anymore.   Allergies  Allergen Reactions  . Aspirin Nausea And Vomiting  . Penicillins Rash  . Sulfa Antibiotics Hives     Current Outpatient Medications:  .  albuterol (PROVENTIL HFA;VENTOLIN HFA) 108 (90 Base) MCG/ACT inhaler, Inhale 2 puffs into the lungs every 6 (six) hours as needed for wheezing or shortness of breath., Disp: 1 Inhaler, Rfl: 2 .  amLODipine (NORVASC) 10 MG tablet, Take 1 tablet (10 mg total) by mouth daily., Disp: 30 tablet, Rfl: 11 .  buPROPion (WELLBUTRIN SR) 150 MG 12 hr tablet, TAKE 1 TABLET BY MOUTH TWICE A DAY, Disp: 180 tablet, Rfl: 4 .  cholecalciferol (VITAMIN D) 1000 units tablet, Take 1,000 Units by mouth  daily., Disp: , Rfl:  .  cyclobenzaprine (FLEXERIL) 5 MG tablet, Take 1 tablet (5 mg total) by mouth 3 (three) times daily as needed for muscle spasms., Disp: 21 tablet, Rfl: 0 .  estradiol (ESTRACE) 1 MG tablet, Take 1 tablets (1 mg total) by mouth daily., Disp: 3 tablet, Rfl: 0 .  gabapentin (NEURONTIN) 300 MG capsule, Take 1 capsule (300 mg total) by mouth 3 (three) times daily., Disp: 90 capsule, Rfl: 3 .  HYDROcodone-acetaminophen (NORCO) 7.5-325 MG tablet, Take 1 tablet by  mouth every 6 (six) hours as needed for moderate pain., Disp: 30 tablet, Rfl: 0 .  meloxicam (MOBIC) 15 MG tablet, Take 1 tablet (15 mg total) by mouth daily. with food, Disp: 30 tablet, Rfl: 5 .  PARoxetine (PAXIL) 30 MG tablet, TAKE 2 TABLETS (60 MG TOTAL) BY MOUTH DAILY., Disp: 180 tablet, Rfl: 4 .  promethazine (PHENERGAN) 25 MG tablet, TAKE 1 TABLET EVERY 4 TO 6 HOURS AS NEEDED FOR NAUSEA, Disp: 30 tablet, Rfl: 5 .  simvastatin (ZOCOR) 40 MG tablet, Take 1 tablet (40 mg total) by mouth daily., Disp: 30 tablet, Rfl: 6 .  ranitidine (ZANTAC) 150 MG tablet, RANITIDINE HCL, 150MG  (Oral Tablet)  1 Two Times A Day for heartburn, indigestion, nausea for 0 days  Quantity: 60.00;  Refills: 5   Ordered :22-Jul-2010  Reginia Forts MD;  Buddy Duty 28-Jan-2010 Active Comments: DX: 787.02, Disp: , Rfl:   Review of Systems  Constitutional: Negative for activity change and fatigue.  Cardiovascular: Negative for chest pain, palpitations and leg swelling.  Musculoskeletal: Positive for arthralgias and back pain.  Allergic/Immunologic: Negative for environmental allergies.  Neurological: Negative for dizziness and headaches.  Psychiatric/Behavioral: Negative for agitation, behavioral problems, decreased concentration, self-injury, sleep disturbance and suicidal ideas. The patient is not nervous/anxious.     Social History   Tobacco Use  . Smoking status: Current Every Day Smoker    Packs/day: 0.50  . Smokeless tobacco: Never Used  . Tobacco comment: Started about 58 years old usually 1/2 ppd  Substance Use Topics  . Alcohol use: Not Currently    Alcohol/week: 0.0 standard drinks      Objective:   BP 136/76 (BP Location: Left Arm, Patient Position: Sitting, Cuff Size: Normal)   Pulse 82   Temp 98.4 F (36.9 C)   Resp 16   Ht 5\' 5"  (1.651 m)   Wt 183 lb (83 kg)   BMI 30.45 kg/m  Vitals:   03/21/19 0953  BP: 136/76  Pulse: 82  Resp: 16  Temp: 98.4 F (36.9 C)  Weight: 183 lb (83 kg)   Height: 5\' 5"  (1.651 m)     Physical Exam   General Appearance:    Alert, cooperative, no distress  Eyes:    PERRL, conjunctiva/corneas clear, EOM's intact       Lungs:     Clear to auscultation bilaterally, respirations unlabored  Heart:    Regular rate and rhythm  Neurologic:   Awake, alert, oriented x 3. No apparent focal neurological           defect.            Assessment & Plan    1. Menopausal symptoms No longer effectively treated with 1mg  estradiol, will double dose to 2mg . She had 90 1mg  tablets dispensed on 3/25 so should have about 30 tables left. Will check with her in about 10 days to see if this is working better and will change to 2mg  tablets.  -  estradiol (ESTRACE) 1 MG tablet; Take 2 tablets (2 mg total) by mouth daily.  Dispense: 3 tablet; Refill: 0  2. Benign essential HTN Well controlled.  Continue current medications.    3. Pre-diabetes Continue low glycemic diet and regular exercise.      Lelon Huh, MD  [facility]

## 2019-03-21 NOTE — Patient Instructions (Addendum)
.   Please review the attached list of medications and notify my office if there are any errors.    You can increase estradiole to 2 tablets daily. If this works well then I'll change the next refill to a 2mg  tablet  . Please call the [facility] [phone]) to schedule a routine screening mammogram.

## 2019-03-29 ENCOUNTER — Other Ambulatory Visit: Payer: Self-pay | Admitting: Family Medicine

## 2019-03-29 DIAGNOSIS — S149XXA Injury of unspecified nerves of neck, initial encounter: Secondary | ICD-10-CM

## 2019-03-29 NOTE — Telephone Encounter (Signed)
Pt needs a refill on her  Hydrocodone 7.5-325  [place]  teri

## 2019-03-30 MED ORDER — HYDROCODONE-ACETAMINOPHEN 7.5-325 MG PO TABS: 1.0000 | ORAL_TABLET | Freq: Four times a day (QID) | ORAL | 0 refills | Status: DC | PRN

## 2019-04-01 ENCOUNTER — Other Ambulatory Visit: Payer: Self-pay | Admitting: Family Medicine

## 2019-04-01 DIAGNOSIS — I10 Essential (primary) hypertension: Secondary | ICD-10-CM

## 2019-04-01 DIAGNOSIS — N951 Menopausal and female climacteric states: Secondary | ICD-10-CM

## 2019-04-01 MED ORDER — ESTRADIOL 2 MG PO TABS: 2.0000 mg | ORAL_TABLET | Freq: Every day | ORAL | 3 refills | Status: DC

## 2019-04-25 ENCOUNTER — Other Ambulatory Visit: Payer: Self-pay

## 2019-04-25 DIAGNOSIS — F32A Depression, unspecified: Secondary | ICD-10-CM

## 2019-04-25 DIAGNOSIS — R202 Paresthesia of skin: Secondary | ICD-10-CM

## 2019-04-25 DIAGNOSIS — F329 Major depressive disorder, single episode, unspecified: Secondary | ICD-10-CM

## 2019-04-25 DIAGNOSIS — S149XXA Injury of unspecified nerves of neck, initial encounter: Secondary | ICD-10-CM

## 2019-04-25 MED ORDER — PAROXETINE HCL 30 MG PO TABS: 60.0000 mg | ORAL_TABLET | Freq: Every day | ORAL | 0 refills | Status: DC

## 2019-04-25 MED ORDER — HYDROCODONE-ACETAMINOPHEN 7.5-325 MG PO TABS: 1.0000 | ORAL_TABLET | Freq: Four times a day (QID) | ORAL | 0 refills | Status: DC | PRN

## 2019-04-25 MED ORDER — GABAPENTIN 300 MG PO CAPS: 300.0000 mg | ORAL_CAPSULE | Freq: Three times a day (TID) | ORAL | 0 refills | Status: DC

## 2019-04-25 NOTE — Telephone Encounter (Signed)
Patient of Dr Caryn Section

## 2019-05-17 ENCOUNTER — Other Ambulatory Visit: Payer: Self-pay

## 2019-05-17 DIAGNOSIS — S149XXA Injury of unspecified nerves of neck, initial encounter: Secondary | ICD-10-CM

## 2019-05-17 MED ORDER — HYDROCODONE-ACETAMINOPHEN 7.5-325 MG PO TABS: 1.0000 | ORAL_TABLET | Freq: Four times a day (QID) | ORAL | 0 refills | Status: DC | PRN

## 2019-05-21 ENCOUNTER — Other Ambulatory Visit: Payer: Self-pay | Admitting: Physician Assistant

## 2019-05-21 DIAGNOSIS — R202 Paresthesia of skin: Secondary | ICD-10-CM

## 2019-06-11 ENCOUNTER — Other Ambulatory Visit: Payer: Self-pay | Admitting: Family Medicine

## 2019-06-11 DIAGNOSIS — S149XXA Injury of unspecified nerves of neck, initial encounter: Secondary | ICD-10-CM

## 2019-06-11 MED ORDER — HYDROCODONE-ACETAMINOPHEN 7.5-325 MG PO TABS: 1.0000 | ORAL_TABLET | Freq: Four times a day (QID) | ORAL | 0 refills | Status: DC | PRN

## 2019-06-11 NOTE — Telephone Encounter (Signed)
Pt needing a refill on: °HYDROcodone-acetaminophen (NORCO) 7.5-325 MG tablet ° °Please fill at: ° ° °[id number] - [place], [place] - [address] [phone] (Phone) °[phone] (Fax)  ° ° °Thanks, °[facility] °

## 2019-06-28 NOTE — Progress Notes (Signed)
Subjective:   Jamie Williams is a 58 y.o. female who presents for Medicare Annual (Subsequent) preventive examination.    This visit is being conducted through telemedicine due to the COVID-19 pandemic. This patient has given me verbal consent via doximity to conduct this visit, patient states they are participating from their home address. Some vital signs may be absent or patient reported.    Patient identification: identified by name, DOB, and current address  Review of Systems:  N/A  Cardiac Risk Factors include: advanced age (>22men, >48 women);dyslipidemia;hypertension     Objective:     Vitals: There were no vitals taken for this visit.  There is no height or weight on file to calculate BMI. Unable to obtain vitals due to visit being conducted via telephonically.   Advanced Directives 07/03/2019 06/27/2018 02/22/2018 09/30/2016 06/06/2016 12/16/2015  Does Patient Have a Medical Advance Directive? No No No No No No  Does patient want to make changes to medical advance directive? - Yes (MAU/Ambulatory/Procedural Areas - Information given) - - - -  Would patient like information on creating a medical advance directive? No - Patient declined - No - Patient declined Yes (MAU/Ambulatory/Procedural Areas - Information given) No - patient declined information Yes Higher education careers adviser given    Tobacco Social History   Tobacco Use  Smoking Status Current Every Day Smoker   Packs/day: 0.50  Smokeless Tobacco Never Used  Tobacco Comment   Started about 58 years old usually 1/2 ppd     Ready to quit: No Counseling given: No Comment: Started about 58 years old usually 1/2 ppd   Clinical Intake:  Pre-visit preparation completed: Yes  Pain : No/denies pain Pain Score: 0-No pain     Nutritional Risks: Nausea/ vomitting/ diarrhea(Nausea occasionally- PCP aware. Unsure of cause.) Diabetes: No  How often do you need to have someone help you when you read instructions,  pamphlets, or other written materials from your doctor or pharmacy?: 1 - Never  Interpreter Needed?: No  Information entered by :: MMarkoski, LPN  Past Medical History:  Diagnosis Date   Anxiety    Family history of adverse reaction to anesthesia    "sister had a difficult time waking up from mastectomy and passed away"    GERD (gastroesophageal reflux disease)    History of chicken pox    History of kidney stones    Hypertension    PONV (postoperative nausea and vomiting)    nausea   Sciatic nerve pain    Past Surgical History:  Procedure Laterality Date   ABDOMINAL HYSTERECTOMY  2011   Abdominal; Ovaries intact; CERVIX INTACT. Fibroids/dys menorrhea. Weaver-Lee   ANTERIOR CERVICAL DECOMP/DISCECTOMY FUSION N/A 12/24/2015   Procedure: Cervical five-six, Cervical six-seven  Anterior cervical decompression/diskectomy/fusion/interbody prosthesis/plate;  Surgeon: Newman Pies, MD;  Location: [place] [facility];  Service: Neurosurgery;  Laterality: N/A;   COLONOSCOPY WITH PROPOFOL N/A 07/17/2018   Procedure: COLONOSCOPY WITH PROPOFOL;  Surgeon: Jonathon Bellows, MD;  Location: [facility];  Service: Gastroenterology;  Laterality: N/A;   [place] and 1996   two Etopic preg.   Head Injuries  2006   surgery 2006 Cram/NS in [place]. Headaches with increased intracranial pressure. Repeat MRI in 2008 Negative   Ulnar Neuropathy Right 2004   Ulnar Neuropathy surgical revision. 2004-2008. [place].   Family History  Problem Relation Age of Onset   Hypertension Mother    Diabetes Mother        Type 2  Breast cancer Mother 46   Cancer Sister 52       Breast   Diabetes Sister        type 2   Breast cancer Sister 70   Diabetes Sister    Diabetes Sister    Diabetes Sister        type 2   Liver cancer Brother    Stomach cancer Brother    Social History   Socioeconomic History   Marital status: Married    Spouse name: Not on file    Number of children: 1   Years of education: Not on file   Highest education level: High school graduate  Occupational History   Occupation: disabled  Social Designer, fashion/clothing strain: Not hard at all   Food insecurity    Worry: Never true    Inability: Never true   Transportation needs    Medical: No    Non-medical: No  Tobacco Use   Smoking status: Current Every Day Smoker    Packs/day: 0.50   Smokeless tobacco: Never Used   Tobacco comment: Started about 58 years old usually 1/2 ppd  Substance and Sexual Activity   Alcohol use: Not Currently    Alcohol/week: 0.0 standard drinks   Drug use: Not Currently   Sexual activity: Not on file  Lifestyle   Physical activity    Days per week: 0 days    Minutes per session: 0 min   Stress: Rather much  Relationships   Social connections    Talks on phone: Patient refused    Gets together: Patient refused    Attends religious service: Patient refused    Active member of club or organization: Patient refused    Attends meetings of clubs or organizations: Patient refused    Relationship status: Patient refused  Other Topics Concern   Not on file  Social History Narrative   Not on file    Outpatient Encounter Medications as of 07/03/2019  Medication Sig   albuterol (PROVENTIL HFA;VENTOLIN HFA) 108 (90 Base) MCG/ACT inhaler Inhale 2 puffs into the lungs every 6 (six) hours as needed for wheezing or shortness of breath.   amLODipine (NORVASC) 10 MG tablet TAKE 1 TABLET BY MOUTH EVERY DAY   buPROPion (WELLBUTRIN SR) 150 MG 12 hr tablet TAKE 1 TABLET BY MOUTH TWICE A DAY   cholecalciferol (VITAMIN D) 1000 units tablet Take 1,000 Units by mouth daily.   estradiol (ESTRACE) 2 MG tablet Take 1 tablet (2 mg total) by mouth daily.   gabapentin (NEURONTIN) 300 MG capsule TAKE 1 CAPSULE BY MOUTH THREE TIMES A DAY   HYDROcodone-acetaminophen (NORCO) 7.5-325 MG tablet Take 1 tablet by mouth every 6 (six) hours as  needed for moderate pain.   meloxicam (MOBIC) 15 MG tablet Take 1 tablet (15 mg total) by mouth daily. with food   PARoxetine (PAXIL) 30 MG tablet Take 2 tablets (60 mg total) by mouth daily.   promethazine (PHENERGAN) 25 MG tablet TAKE 1 TABLET BY MOUTH EVERY 4 TO 6 HOURS AS NEEDED FOR NAUSEA   ranitidine (ZANTAC) 150 MG tablet RANITIDINE HCL, 150MG  (Oral Tablet)  1 Two Times A Day for heartburn, indigestion, nausea for 0 days  Quantity: 60.00;  Refills: 5   Ordered :22-Jul-2010  Reginia Forts MD;  Buddy Duty 28-Jan-2010 Active Comments: DX: 787.02   cyclobenzaprine (FLEXERIL) 5 MG tablet Take 1 tablet (5 mg total) by mouth 3 (three) times daily as needed for muscle spasms. (Patient not taking:  Reported on 07/03/2019)   simvastatin (ZOCOR) 40 MG tablet Take 1 tablet (40 mg total) by mouth daily. (Patient not taking: Reported on 07/03/2019)   No facility-administered encounter medications on file as of 07/03/2019.     Activities of Daily Living In your present state of health, do you have any difficulty performing the following activities: 07/03/2019  Hearing? N  Vision? N  Difficulty concentrating or making decisions? N  Walking or climbing stairs? Y  Comment Due to knee pain when climbing up stairs.  Dressing or bathing? N  Doing errands, shopping? N  Preparing Food and eating ? N  Using the Toilet? N  In the past six months, have you accidently leaked urine? N  Do you have problems with loss of bowel control? N  Managing your Medications? N  Managing your Finances? N  Housekeeping or managing your Housekeeping? N  Some recent data might be hidden    Patient Care Team: [name], MD as PCP - General (Family Medicine)    Assessment:   This is a routine wellness examination for Jamie Williams.  Exercise Activities and Dietary recommendations Current Exercise Habits: The patient does not participate in regular exercise at present, Exercise limited by: orthopedic  condition(s)  Goals     DIET - REDUCE PORTION SIZE     Recommend to eat 3 small meals a day with two healthy snacks in between.      Increase water intake     Starting 09/30/16, I will continue to drink 4 glasses of water a day.       Fall Risk: Fall Risk  07/03/2019 06/27/2018 09/30/2016  Falls in the past year? 0 No No    FALL RISK PREVENTION PERTAINING TO THE HOME:  Any stairs in or around the home? Yes  If so, are there any without handrails? No   Home free of loose throw rugs in walkways, pet beds, electrical cords, etc? Yes  Adequate lighting in your home to reduce risk of falls? Yes   ASSISTIVE DEVICES UTILIZED TO PREVENT FALLS:  Life alert? No  Use of a cane, walker or w/c? No  Grab bars in the bathroom? Yes  Shower chair or bench in shower? No  Elevated toilet seat or a handicapped toilet? Yes    TIMED UP AND GO:  Was the test performed? No .    Depression Screen PHQ 2/9 Scores 07/03/2019 06/27/2018 11/14/2017 09/30/2016  PHQ - 2 Score 1 2 2  0  PHQ- 9 Score - 5 4 -     Cognitive Function     6CIT Screen 07/03/2019 06/27/2018 09/30/2016  What Year? 0 points 0 points 0 points  What month? 0 points 0 points 0 points  What time? 0 points 0 points 0 points  Count back from 20 0 points 0 points 0 points  Months in reverse 0 points 2 points 2 points  Repeat phrase 2 points 0 points 4 points  Total Score 2 2 6     Immunization History  Administered Date(s) Administered   Influenza,inj,Quad PF,6+ Mos 08/29/2015, 08/26/2016, 11/14/2017, 07/11/2018   Pneumococcal Polysaccharide-23 02/24/2011   Tdap 01/28/2010    Tdap: Although this vaccine is not a covered service during a Wellness Exam, does the patient still wish to receive this vaccine today?  No .   Flu Vaccine: Due for Flu vaccine. Does the patient want to receive this vaccine today?  No .    Screening Tests Health Maintenance  Topic Date Due  MAMMOGRAM  09/09/2017   INFLUENZA VACCINE  05/26/2019    PAP SMEAR-Modifier  07/12/2019   TETANUS/TDAP  01/29/2020   COLONOSCOPY  07/18/2023   Hepatitis C Screening  Completed   HIV Screening  Completed    Cancer Screenings:  Colorectal Screening: Completed 07/17/18. Repeat every 5 years.  Mammogram: Completed 09/10/15. Ordered today. Pt provided with contact info and advised to call to schedule appt. Pt aware the office will call re: appt.  Lung Cancer Screening: (Low Dose CT Chest recommended if Age 16-80 years, 30 pack-year currently smoking OR have quit w/in 15years.) does not qualify.   Additional Screening:  Hepatitis C Screening: Up to date  Vision Screening: Recommended annual ophthalmology exams for early detection of glaucoma and other disorders of the eye.  Dental Screening: Recommended annual dental exams for proper oral hygiene  Community Resource Referral:  CRR required this visit?  No       Plan:  I have personally reviewed and addressed the Medicare Annual Wellness questionnaire and have noted the following in the patients chart:  A. Medical and social history B. Use of alcohol, tobacco or illicit drugs  C. Current medications and supplements D. Functional ability and status E.  Nutritional status F.  Physical activity G. Advance directives H. List of other physicians I.  Hospitalizations, surgeries, and ER visits in previous 12 months J.  [place] such as hearing and vision if needed, cognitive and depression L. Referrals and appointments   In addition, I have reviewed and discussed with patient certain preventive protocols, quality metrics, and best practice recommendations. A written personalized care plan for preventive services as well as general preventive health recommendations were provided to patient. Nurse Health Advisor  Signed,    Kendrik Mcshan [place], [place]  [id number] Nurse Health Advisor   Nurse Notes: Due for an influenza vaccine at next in office visit.

## 2019-07-03 ENCOUNTER — Other Ambulatory Visit: Payer: Self-pay

## 2019-07-03 ENCOUNTER — Ambulatory Visit (INDEPENDENT_AMBULATORY_CARE_PROVIDER_SITE_OTHER): Payer: BC Managed Care – PPO

## 2019-07-03 DIAGNOSIS — Z1231 Encounter for screening mammogram for malignant neoplasm of breast: Secondary | ICD-10-CM | POA: Diagnosis not present

## 2019-07-03 DIAGNOSIS — Z0000 Encounter for general adult medical examination without abnormal findings: Secondary | ICD-10-CM | POA: Diagnosis not present

## 2019-07-03 NOTE — Patient Instructions (Signed)
Jamie Williams , Thank you for taking time to come for your Medicare Wellness Visit. I appreciate your ongoing commitment to your health goals. Please review the following plan we discussed and let me know if I can assist you in the future.   Screening recommendations/referrals: Colonoscopy: Up to date, due 06/2023 Mammogram: Ordered today. Pt provided with contact info and advised to call to schedule appt.  Recommended yearly dental visit for hygiene and checkup  Vaccinations: Influenza vaccine: Currently due Tdap vaccine: Up to date, due 01-2020    Advanced directives: Please bring a copy of your POA (Power of [place]) and/or Living Will to your next appointment once completing paper work.   Conditions/risks identified: Continue to work towards eating 3 small meals a day with 2 healthy snacks in between.   Next appointment: 07/13/19 @ 10:00 AM with Dr Caryn Section.   Preventive Care 40-64 Years, Female Preventive care refers to lifestyle choices and visits with your health care provider that can promote health and wellness. What does preventive care include?  A yearly physical exam. This is also called an annual well check.  Dental exams once or twice a year.  Routine eye exams. Ask your health care provider how often you should have your eyes checked.  Personal lifestyle choices, including:  Daily care of your teeth and gums.  Regular physical activity.  Eating a healthy diet.  Avoiding tobacco and drug use.  Limiting alcohol use.  Practicing safe sex.  Taking low-dose aspirin daily starting at age 41.  Taking vitamin and mineral supplements as recommended by your health care provider. What happens during an annual well check? The services and screenings done by your health care provider during your annual well check will depend on your age, overall health, lifestyle risk factors, and family history of disease. Counseling  Your health care provider may ask you questions about  your:  Alcohol use.  Tobacco use.  Drug use.  Emotional well-being.  Home and relationship well-being.  Sexual activity.  Eating habits.  Work and work Statistician.  Method of birth control.  Menstrual cycle.  Pregnancy history. Screening  You may have the following tests or measurements:  Height, weight, and BMI.  Blood pressure.  Lipid and cholesterol levels. These may be checked every 5 years, or more frequently if you are over 56 years old.  Skin check.  Lung cancer screening. You may have this screening every year starting at age 12 if you have a 30-pack-year history of smoking and currently smoke or have quit within the past 15 years.  Fecal occult blood test (FOBT) of the stool. You may have this test every year starting at age 33.  Flexible sigmoidoscopy or colonoscopy. You may have a sigmoidoscopy every 5 years or a colonoscopy every 10 years starting at age 50.  Hepatitis C blood test.  Hepatitis B blood test.  Sexually transmitted disease (STD) testing.  Diabetes screening. This is done by checking your blood sugar (glucose) after you have not eaten for a while (fasting). You may have this done every 1-3 years.  Mammogram. This may be done every 1-2 years. Talk to your health care provider about when you should start having regular mammograms. This may depend on whether you have a family history of breast cancer.  BRCA-related cancer screening. This may be done if you have a family history of breast, ovarian, tubal, or peritoneal cancers.  Pelvic exam and Pap test. This may be done every 3 years starting at  age 73. Starting at age 42, this may be done every 5 years if you have a Pap test in combination with an HPV test.  Bone density scan. This is done to screen for osteoporosis. You may have this scan if you are at high risk for osteoporosis. Discuss your test results, treatment options, and if necessary, the need for more tests with your health care  provider. Vaccines  Your health care provider may recommend certain vaccines, such as:  Influenza vaccine. This is recommended every year.  Tetanus, diphtheria, and acellular pertussis (Tdap, Td) vaccine. You may need a Td booster every 10 years.  Zoster vaccine. You may need this after age 40.  Pneumococcal 13-valent conjugate (PCV13) vaccine. You may need this if you have certain conditions and were not previously vaccinated.  Pneumococcal polysaccharide (PPSV23) vaccine. You may need one or two doses if you smoke cigarettes or if you have certain conditions. Talk to your health care provider about which screenings and vaccines you need and how often you need them. This information is not intended to replace advice given to you by your health care provider. Make sure you discuss any questions you have with your health care provider. Document Released: 11/07/2015 Document Revised: 06/30/2016 Document Reviewed: 08/12/2015 Elsevier Interactive Patient Education  2017 [employer] Prevention in the Home Falls can cause injuries. They can happen to people of all ages. There are many things you can do to make your home safe and to help prevent falls. What can I do on the outside of my home?  Regularly fix the edges of walkways and driveways and fix any cracks.  Remove anything that might make you trip as you walk through a door, such as a raised step or threshold.  Trim any bushes or trees on the path to your home.  Use bright outdoor lighting.  Clear any walking paths of anything that might make someone trip, such as rocks or tools.  Regularly check to see if handrails are loose or broken. Make sure that both sides of any steps have handrails.  Any raised decks and porches should have guardrails on the edges.  Have any leaves, snow, or ice cleared regularly.  Use sand or salt on walking paths during winter.  Clean up any spills in your garage right away. This includes  oil or grease spills. What can I do in the bathroom?  Use night lights.  Install grab bars by the toilet and in the tub and shower. Do not use towel bars as grab bars.  Use non-skid mats or decals in the tub or shower.  If you need to sit down in the shower, use a plastic, non-slip stool.  Keep the floor dry. Clean up any water that spills on the floor as soon as it happens.  Remove soap buildup in the tub or shower regularly.  Attach bath mats securely with double-sided non-slip rug tape.  Do not have throw rugs and other things on the floor that can make you trip. What can I do in the bedroom?  Use night lights.  Make sure that you have a light by your bed that is easy to reach.  Do not use any sheets or blankets that are too big for your bed. They should not hang down onto the floor.  Have a firm chair that has side arms. You can use this for support while you get dressed.  Do not have throw rugs and other things  on the floor that can make you trip. What can I do in the kitchen?  Clean up any spills right away.  Avoid walking on wet floors.  Keep items that you use a lot in easy-to-reach places.  If you need to reach something above you, use a strong step stool that has a grab bar.  Keep electrical cords out of the way.  Do not use floor polish or wax that makes floors slippery. If you must use wax, use non-skid floor wax.  Do not have throw rugs and other things on the floor that can make you trip. What can I do with my stairs?  Do not leave any items on the stairs.  Make sure that there are handrails on both sides of the stairs and use them. Fix handrails that are broken or loose. Make sure that handrails are as long as the stairways.  Check any carpeting to make sure that it is firmly attached to the stairs. Fix any carpet that is loose or worn.  Avoid having throw rugs at the top or bottom of the stairs. If you do have throw rugs, attach them to the floor  with carpet tape.  Make sure that you have a light switch at the top of the stairs and the bottom of the stairs. If you do not have them, ask someone to add them for you. What else can I do to help prevent falls?  Wear shoes that:  Do not have high heels.  Have rubber bottoms.  Are comfortable and fit you well.  Are closed at the toe. Do not wear sandals.  If you use a stepladder:  Make sure that it is fully opened. Do not climb a closed stepladder.  Make sure that both sides of the stepladder are locked into place.  Ask someone to hold it for you, if possible.  Clearly mark and make sure that you can see:  Any grab bars or handrails.  First and last steps.  Where the edge of each step is.  Use tools that help you move around (mobility aids) if they are needed. These include:  Canes.  Walkers.  Scooters.  Crutches.  Turn on the lights when you go into a dark area. Replace any light bulbs as soon as they burn out.  Set up your furniture so you have a clear path. Avoid moving your furniture around.  If any of your floors are uneven, fix them.  If there are any pets around you, be aware of where they are.  Review your medicines with your doctor. Some medicines can make you feel dizzy. This can increase your chance of falling. Ask your doctor what other things that you can do to help prevent falls. This information is not intended to replace advice given to you by your health care provider. Make sure you discuss any questions you have with your health care provider. Document Released: 08/07/2009 Document Revised: 03/18/2016 Document Reviewed: 11/15/2014 Elsevier Interactive Patient Education  2017 [employer].

## 2019-07-06 ENCOUNTER — Other Ambulatory Visit: Payer: Self-pay

## 2019-07-06 DIAGNOSIS — S149XXA Injury of unspecified nerves of neck, initial encounter: Secondary | ICD-10-CM

## 2019-07-06 MED ORDER — HYDROCODONE-ACETAMINOPHEN 7.5-325 MG PO TABS: 1.0000 | ORAL_TABLET | Freq: Four times a day (QID) | ORAL | 0 refills | Status: DC | PRN

## 2019-07-06 NOTE — Telephone Encounter (Signed)
Patient requesting refill on the following medication: HYDROcodone-acetaminophen ([place]) 7.5-325 MG tablet   Pharmacy: [facility] on [address]

## 2019-07-13 ENCOUNTER — Encounter: Payer: Self-pay | Admitting: Family Medicine

## 2019-07-13 ENCOUNTER — Ambulatory Visit (INDEPENDENT_AMBULATORY_CARE_PROVIDER_SITE_OTHER): Payer: BC Managed Care – PPO | Admitting: Family Medicine

## 2019-07-13 ENCOUNTER — Other Ambulatory Visit (HOSPITAL_COMMUNITY)
Admission: RE | Admit: 2019-07-13 | Discharge: 2019-07-13 | Disposition: A | Discharge status: Home / Self Care | Payer: BC Managed Care – PPO | Source: Ambulatory Visit | Attending: Family Medicine | Admitting: Family Medicine

## 2019-07-13 ENCOUNTER — Other Ambulatory Visit: Payer: Self-pay

## 2019-07-13 VITALS — BP 144/90 | HR 80 | Temp 96.6°F | Resp 16 | Ht 65.0 in | Wt 180.4 lb

## 2019-07-13 DIAGNOSIS — Z01419 Encounter for gynecological examination (general) (routine) without abnormal findings: Secondary | ICD-10-CM | POA: Diagnosis not present

## 2019-07-13 DIAGNOSIS — E559 Vitamin D deficiency, unspecified: Secondary | ICD-10-CM | POA: Diagnosis not present

## 2019-07-13 DIAGNOSIS — Z124 Encounter for screening for malignant neoplasm of cervix: Secondary | ICD-10-CM

## 2019-07-13 DIAGNOSIS — Z720 Tobacco use: Secondary | ICD-10-CM | POA: Diagnosis not present

## 2019-07-13 DIAGNOSIS — I10 Essential (primary) hypertension: Secondary | ICD-10-CM | POA: Diagnosis not present

## 2019-07-13 DIAGNOSIS — Z23 Encounter for immunization: Secondary | ICD-10-CM | POA: Diagnosis not present

## 2019-07-13 DIAGNOSIS — I700 Atherosclerosis of aorta: Secondary | ICD-10-CM

## 2019-07-13 DIAGNOSIS — G629 Polyneuropathy, unspecified: Secondary | ICD-10-CM | POA: Diagnosis not present

## 2019-07-13 DIAGNOSIS — R7303 Prediabetes: Secondary | ICD-10-CM

## 2019-07-13 DIAGNOSIS — Z1151 Encounter for screening for human papillomavirus (HPV): Secondary | ICD-10-CM | POA: Diagnosis not present

## 2019-07-13 DIAGNOSIS — Z1239 Encounter for other screening for malignant neoplasm of breast: Secondary | ICD-10-CM | POA: Diagnosis not present

## 2019-07-13 DIAGNOSIS — Z90710 Acquired absence of both cervix and uterus: Secondary | ICD-10-CM | POA: Diagnosis not present

## 2019-07-13 DIAGNOSIS — F3342 Major depressive disorder, recurrent, in full remission: Secondary | ICD-10-CM | POA: Diagnosis not present

## 2019-07-13 NOTE — Patient Instructions (Addendum)
.   Please review the attached list of medications and notify my office if there are any errors.   . Please bring all of your medications to every appointment so we can make sure that our medication list is the same as yours.   Marland Kitchen Keep working on quitting smoking

## 2019-07-13 NOTE — Progress Notes (Signed)
Patient: Jamie Williams, Female    DOB: [birth year], 58 y.o.   MRN: [id number] Visit Date: 07/13/2019  Today's Provider: Lelon Huh, MD   Chief Complaint  Patient presents with  . Medicare Wellness   Subjective:    Pap/pelvic/breast exam  -----------------------------------------------------------  Hypertension, follow-up:  BP Readings from Last 3 Encounters:  07/13/19 (!) 144/90  03/21/19 136/76  10/20/18 (!) 151/95    She was last seen for hypertension 4 months ago.  BP at that visit was 136/76. Management changes since that visit include none.  She reports excellent compliance with treatment. She is not having side effects.  She is not exercising. She is adherent to low salt diet.   Outside blood pressures are being checked at home, systolic ranging from [id number]. Diastolic ranging from [id number] She is experiencing none.  Patient denies chest pain, chest pressure/discomfort, claudication, dyspnea, exertional chest pressure/discomfort, fatigue, irregular heart beat, lower extremity edema, near-syncope, orthopnea, palpitations, paroxysmal nocturnal dyspnea, syncope and tachypnea.   Cardiovascular risk factors include advanced age (older than 71 for men, 36 for women), hypertension and smoking/ tobacco exposure.  Use of agents associated with hypertension: NSAIDS.     Weight trend: stable Wt Readings from Last 3 Encounters:  07/13/19 180 lb 6.4 oz (81.8 kg)  03/21/19 183 lb (83 kg)  10/20/18 178 lb (80.7 kg)    Current diet: well balanced  ------------------------------------------------------------------------   Prediabetes, Follow-up:   Lab Results  Component Value Date   HGBA1C 5.8 (A) 10/20/2018   HGBA1C 5.8 (H) 03/07/2018   HGBA1C 5.9 (H) 11/14/2017   GLUCOSE 106 (H) 02/22/2018   GLUCOSE 122 (H) 11/14/2017   GLUCOSE 107 (H) 01/10/2017    Last seen for for this5 months ago.  Management since that visit includes none, continue diet and  exercise. Current symptoms include polydipsia and have been unchanged.  Weight trend: stable Prior visit with dietician: no Current diet: well balanced Current exercise: none  Pertinent Labs:    Component Value Date/Time   CHOL 195 03/07/2018 0938   TRIG 80 03/07/2018 0938   CHOLHDL 3.9 03/07/2018 0938   CREATININE 0.98 02/22/2018 1155   CREATININE 0.93 11/23/2014 1612    Wt Readings from Last 3 Encounters:  07/13/19 180 lb 6.4 oz (81.8 kg)  03/21/19 183 lb (83 kg)  10/20/18 178 lb (80.7 kg)    Review of Systems  Constitutional: Positive for diaphoresis.  Gastrointestinal: Positive for nausea.  Endocrine: Positive for polydipsia.  Musculoskeletal: Positive for back pain.  Neurological: Positive for headaches.    Social History   Socioeconomic History  . Marital status: Married    Spouse name: Not on file  . Number of children: 1  . Years of education: Not on file  . Highest education level: High school graduate  Occupational History  . Occupation: disabled  Social Needs  . Financial resource strain: Not hard at all  . Food insecurity    Worry: Never true    Inability: Never true  . Transportation needs    Medical: No    Non-medical: No  Tobacco Use  . Smoking status: Current Every Day Smoker    Packs/day: 0.50  . Smokeless tobacco: Never Used  . Tobacco comment: Started about 58 years old usually 1/2 ppd  Substance and Sexual Activity  . Alcohol use: Not Currently    Alcohol/week: 0.0 standard drinks  . Drug use: Not Currently  . Sexual activity: Not on file  Lifestyle  .  Physical activity    Days per week: 0 days    Minutes per session: 0 min  . Stress: Rather much  Relationships  . Social Herbalist on phone: Patient refused    Gets together: Patient refused    Attends religious service: Patient refused    Active member of club or organization: Patient refused    Attends meetings of clubs or organizations: Patient refused     Relationship status: Patient refused  . Intimate partner violence    Fear of current or ex partner: Patient refused    Emotionally abused: Patient refused    Physically abused: Patient refused    Forced sexual activity: Patient refused  Other Topics Concern  . Not on file  Social History Narrative  . Not on file    Past Medical History:  Diagnosis Date  . Anxiety   . Family history of adverse reaction to anesthesia    "sister had a difficult time waking up from mastectomy and passed away"   . GERD (gastroesophageal reflux disease)   . History of chicken pox   . History of kidney stones   . Hypertension   . PONV (postoperative nausea and vomiting)    nausea  . Sciatic nerve pain      Patient Active Problem List   Diagnosis Date Noted  . Aortic atherosclerosis ([place]) 10/23/2018  . GERD (gastroesophageal reflux disease)   . Menopausal symptoms 01/10/2017  . Cervical spondylosis with radiculopathy 12/24/2015  . Compression injury of cervical spinal nerve 10/07/2015  . Left arm pain 08/29/2015  . Neck pain 08/29/2015  . Hematuria 07/03/2015  . Allergic arthritis 06/27/2015  . Arthritis 06/27/2015  . Back ache 06/27/2015  . Clinical depression 06/27/2015  . H/O abnormal cervical Papanicolaou smear 06/27/2015  . Elevated intracranial pressure 06/27/2015  . Kidney stones 06/27/2015  . Knee pain 06/27/2015  . Abnormal mammogram 06/27/2015  . Headache, migraine 06/27/2015  . Neuropathy 06/27/2015  . Hand paresthesia 06/27/2015  . Pre-diabetes 06/27/2015  . Vitamin D deficiency 06/27/2015  . Weight loss 06/27/2015  . Adjustment reaction 06/27/2015  . Hypercholesterolemia without hypertriglyceridemia 01/29/2010  . Tobacco abuse 01/28/2010  . Benign essential HTN 09/16/2009  . Insomnia 09/16/2009  . Lesion of ulnar nerve 09/16/2009    Past Surgical History:  Procedure Laterality Date  . ABDOMINAL HYSTERECTOMY  2011   Abdominal; Ovaries intact; CERVIX INTACT.  Fibroids/dys menorrhea. [place]  . ANTERIOR CERVICAL DECOMP/DISCECTOMY FUSION N/A 12/24/2015   Procedure: Cervical five-six, Cervical six-seven  Anterior cervical decompression/diskectomy/fusion/interbody prosthesis/plate;  Surgeon: Newman Pies, MD;  Location: [facility]UR[facility]S;  Service: Neurosurgery;  Laterality: N/A;  . COLONOSCOPY WITH PROPOFOL N/A 07/17/2018   Procedure: COLONOSCOPY WITH PROPOFOL;  Surgeon: Jonathon Bellows, MD;  Location: [facility] END[facility]COPY;  Service: Gastroenterology;  Laterality: N/A;  . [place] and 1996   two Etopic preg.  Marland Kitchen Head Injuries  2006   surgery 2006 Cram/NS in [place]. Headaches with increased intracranial pressure. Repeat MRI in 2008 Negative  . Ulnar Neuropathy Right 2004   Ulnar Neuropathy surgical revision. 2004-2008. [place].    Her family history includes Breast cancer (age of onset: 34) in her sister; Breast cancer (age of onset: 56) in her mother; Cancer (age of onset: 87) in her sister; Diabetes in her mother, sister, sister, sister, and sister; Hypertension in her mother; Liver cancer in her brother; Stomach cancer in her brother.   Current Outpatient Medications:  .  albuterol (PROVENTIL HFA;VENTOLIN  HFA) 108 (90 Base) MCG/ACT inhaler, Inhale 2 puffs into the lungs every 6 (six) hours as needed for wheezing or shortness of breath., Disp: 1 Inhaler, Rfl: 2 .  amLODipine (NORVASC) 10 MG tablet, TAKE 1 TABLET BY MOUTH EVERY DAY, Disp: 90 tablet, Rfl: 3 .  buPROPion (WELLBUTRIN SR) 150 MG 12 hr tablet, TAKE 1 TABLET BY MOUTH TWICE A DAY, Disp: 180 tablet, Rfl: 4 .  cholecalciferol (VITAMIN D) 1000 units tablet, Take 1,000 Units by mouth daily., Disp: , Rfl:  .  estradiol (ESTRACE) 2 MG tablet, Take 1 tablet (2 mg total) by mouth daily., Disp: 90 tablet, Rfl: 3 .  gabapentin (NEURONTIN) 300 MG capsule, TAKE 1 CAPSULE BY MOUTH THREE TIMES A DAY, Disp: 270 capsule, Rfl: 1 .  HYDROcodone-acetaminophen (NORCO) 7.5-325 MG tablet, Take 1  tablet by mouth every 6 (six) hours as needed for moderate pain., Disp: 30 tablet, Rfl: 0 .  meloxicam (MOBIC) 15 MG tablet, Take 1 tablet (15 mg total) by mouth daily. with food, Disp: 30 tablet, Rfl: 5 .  PARoxetine (PAXIL) 30 MG tablet, Take 2 tablets (60 mg total) by mouth daily., Disp: 180 tablet, Rfl: 0 .  promethazine (PHENERGAN) 25 MG tablet, TAKE 1 TABLET BY MOUTH EVERY 4 TO 6 HOURS AS NEEDED FOR NAUSEA, Disp: 30 tablet, Rfl: 5 .  ranitidine (ZANTAC) 150 MG tablet, RANITIDINE HCL, 150MG  (Oral Tablet)  1 Two Times A Day for heartburn, indigestion, nausea for 0 days  Quantity: 60.00;  Refills: 5   Ordered :22-Jul-2010  Reginia Forts MD;  Buddy Duty 28-Jan-2010 Active Comments: DX: 787.02, Disp: , Rfl:  .  cyclobenzaprine (FLEXERIL) 5 MG tablet, Take 1 tablet (5 mg total) by mouth 3 (three) times daily as needed for muscle spasms. (Patient not taking: Reported on 07/03/2019), Disp: 21 tablet, Rfl: 0  Patient Care Team: [name], MD as PCP - General (Family Medicine)    Objective:    Vitals: BP (!) 144/90   Pulse 80   Temp (!) 96.6 F (35.9 C) (Oral)   Resp 16   Ht 5\' 5"  (1.651 m)   Wt 180 lb 6.4 oz (81.8 kg)   BMI 30.02 kg/m   Physical Exam   General Appearance:    Alert, cooperative, no distress, appears stated age  Head:    Normocephalic, without obvious abnormality, atraumatic  Eyes:    PERRL, conjunctiva/corneas clear, EOM's intact, fundi    benign, both eyes  Ears:    Normal TM's and external ear canals, both ears  Nose:   Nares normal, septum midline, mucosa normal, no drainage    or sinus tenderness  Throat:   Lips, mucosa, and tongue normal; teeth and gums normal  Neck:   Supple, symmetrical, trachea midline, no adenopathy;    thyroid:  no enlargement/tenderness/nodules; no carotid   bruit or JVD  Back:     Symmetric, no curvature, ROM normal, no CVA tenderness  Lungs:     Clear to auscultation bilaterally, respirations unlabored  Chest Wall:    No tenderness or  deformity   Heart:    Normal heart rate. Normal rhythm. No murmurs, rubs, or gallops.   Breast Exam:    normal appearance, no masses or tenderness  Abdomen:     Soft, non-tender, bowel sounds active all four quadrants,    no masses, no organomegaly  Pelvic:    cervix normal in appearance, uterus surgically absent and vagina normal without discharge  Extremities:   All extremities are  intact. No cyanosis or edema  Pulses:   2+ and symmetric all extremities  Skin:   Skin color, texture, turgor normal, no rashes or lesions  Lymph nodes:   Cervical, supraclavicular, and axillary nodes normal  Neurologic:   CNII-XII intact, normal strength, sensation and reflexes    throughout      Assessment & Plan:    1. Benign essential HTN She report that home BP is usually much better than BP reading in office today.  Continue current medications.   - Lipid panel - Comprehensive metabolic panel - CBC 2. Aortic atherosclerosis (HCC) Asymptomatic. Compliant with medication.  Continue aggressive risk factor modification.    3. Neuropathy Continue gabapentin  4. Vitamin D deficiency  - VITAMIN D 25 Hydroxy (Vit-D Deficiency, Fractures)  5. Pre-diabetes  - Hemoglobin A1c  6. Tobacco abuse Continue working on smoking cessation  7. Recurrent major depressive disorder, in full remission ([place]) Continue bupropion and paroxetine  8. . Cervical cancer screening  - Cytology - PAP with HPV  10. Breast cancer screening   11. Need for influenza vaccination  - Flu Vaccine QUAD 36+ mos IM     Lelon Huh, MD  [facility]

## 2019-07-14 LAB — LIPID PANEL
Chol/HDL Ratio: 4 ratio (ref 0.0–4.4)
Cholesterol, Total: 223 mg/dL — ABNORMAL HIGH (ref 100–199)
HDL: 56 mg/dL (ref >39)
LDL Chol Calc (NIH): 151 mg/dL — ABNORMAL HIGH (ref 0–99)
Triglycerides: 93 mg/dL (ref 0–149)
VLDL Cholesterol Cal: 16 mg/dL (ref 5–40)

## 2019-07-14 LAB — COMPREHENSIVE METABOLIC PANEL
ALT: 13 IU/L (ref 0–32)
AST: 15 IU/L (ref 0–40)
Albumin/Globulin Ratio: 2 (ref 1.2–2.2)
Albumin: 4.7 g/dL (ref 3.8–4.9)
Alkaline Phosphatase: 129 IU/L — ABNORMAL HIGH (ref 39–117)
BUN/Creatinine Ratio: 15 (ref 9–23)
BUN: 13 mg/dL (ref 6–24)
Bilirubin Total: 0.3 mg/dL (ref 0.0–1.2)
CO2: 22 mmol/L (ref 20–29)
Calcium: 10.2 mg/dL (ref 8.7–10.2)
Chloride: 105 mmol/L (ref 96–106)
Creatinine, Ser: 0.86 mg/dL (ref 0.57–1.00)
GFR calc Af Amer: 87 mL/min/{1.73_m2} (ref >59)
GFR calc non Af Amer: 76 mL/min/{1.73_m2} (ref >59)
Globulin, Total: 2.3 g/dL (ref 1.5–4.5)
Glucose: 96 mg/dL (ref 65–99)
Potassium: 4.6 mmol/L (ref 3.5–5.2)
Sodium: 142 mmol/L (ref 134–144)
Total Protein: 7 g/dL (ref 6.0–8.5)

## 2019-07-14 LAB — HEMOGLOBIN A1C
Est. average glucose Bld gHb Est-mCnc: 126 mg/dL
Hgb A1c MFr Bld: 6 % — ABNORMAL HIGH (ref 4.8–5.6)

## 2019-07-14 LAB — CBC
Hematocrit: 44.7 % (ref 34.0–46.6)
Hemoglobin: 14.8 g/dL (ref 11.1–15.9)
MCH: 27.9 pg (ref 26.6–33.0)
MCHC: 33.1 g/dL (ref 31.5–35.7)
MCV: 84 fL (ref 79–97)
Platelets: 324 10*3/uL (ref 150–450)
RBC: 5.3 x10E6/uL — ABNORMAL HIGH (ref 3.77–5.28)
RDW: 12.7 % (ref 11.7–15.4)
WBC: 10.9 10*3/uL — ABNORMAL HIGH (ref 3.4–10.8)

## 2019-07-14 LAB — VITAMIN D 25 HYDROXY (VIT D DEFICIENCY, FRACTURES): Vit D, 25-Hydroxy: 19.1 ng/mL — ABNORMAL LOW (ref 30.0–100.0)

## 2019-07-16 ENCOUNTER — Telehealth: Payer: Self-pay

## 2019-07-16 MED ORDER — PRAVASTATIN SODIUM 20 MG PO TABS: 20.0000 mg | ORAL_TABLET | Freq: Every day | ORAL | 3 refills | Status: DC

## 2019-07-16 NOTE — Telephone Encounter (Signed)
-----   Message from [name], MD sent at 07/14/2019 10:16 AM EDT ----- Cholesterol is much too high, vitamin d level is too low.  Needs to start pravastatin 20mg  once a day, #30, rf x 3 and OTC vitamin D3 2000units once a day. Need to recheck lipids and vitamin D in 3 months.

## 2019-07-16 NOTE — Telephone Encounter (Signed)
Patient advised as below. Patient verbalizes understanding and is in agreement with treatment plan.  

## 2019-07-17 LAB — CYTOLOGY - PAP
Diagnosis: NEGATIVE
High risk HPV: POSITIVE — AB
Molecular Disclaimer: 56
Molecular Disclaimer: DETECTED
Molecular Disclaimer: NORMAL

## 2019-07-19 ENCOUNTER — Telehealth: Payer: Self-pay | Admitting: Obstetrics & Gynecology

## 2019-07-19 ENCOUNTER — Other Ambulatory Visit: Payer: Self-pay

## 2019-07-19 DIAGNOSIS — B977 Papillomavirus as the cause of diseases classified elsewhere: Secondary | ICD-10-CM

## 2019-07-19 NOTE — Telephone Encounter (Signed)
[facility] referring for HPV (human papilloma virus) infection ([place]). Called and left voicemail for patient to call back to be schedule

## 2019-07-26 ENCOUNTER — Other Ambulatory Visit: Payer: Self-pay

## 2019-07-26 DIAGNOSIS — S149XXA Injury of unspecified nerves of neck, initial encounter: Secondary | ICD-10-CM

## 2019-07-26 NOTE — Telephone Encounter (Signed)
Patient called requesting refill

## 2019-07-27 MED ORDER — HYDROCODONE-ACETAMINOPHEN 7.5-325 MG PO TABS: 1.0000 | ORAL_TABLET | Freq: Four times a day (QID) | ORAL | 0 refills | Status: DC | PRN

## 2019-08-06 ENCOUNTER — Other Ambulatory Visit (HOSPITAL_COMMUNITY)
Admission: RE | Admit: 2019-08-06 | Discharge: 2019-08-06 | Disposition: A | Discharge status: Home / Self Care | Payer: BC Managed Care – PPO | Source: Ambulatory Visit | Attending: Obstetrics and Gynecology | Admitting: Obstetrics and Gynecology

## 2019-08-06 ENCOUNTER — Other Ambulatory Visit: Payer: Self-pay

## 2019-08-06 ENCOUNTER — Encounter: Payer: Self-pay | Admitting: Obstetrics and Gynecology

## 2019-08-06 ENCOUNTER — Ambulatory Visit (INDEPENDENT_AMBULATORY_CARE_PROVIDER_SITE_OTHER): Payer: BC Managed Care – PPO | Admitting: Obstetrics and Gynecology

## 2019-08-06 VITALS — BP 124/78 | Ht 65.0 in | Wt 192.0 lb

## 2019-08-06 DIAGNOSIS — R8789 Other abnormal findings in specimens from female genital organs: Secondary | ICD-10-CM | POA: Diagnosis not present

## 2019-08-06 DIAGNOSIS — R87618 Other abnormal cytological findings on specimens from cervix uteri: Secondary | ICD-10-CM

## 2019-08-06 DIAGNOSIS — N879 Dysplasia of cervix uteri, unspecified: Secondary | ICD-10-CM | POA: Diagnosis not present

## 2019-08-06 NOTE — Progress Notes (Signed)
Referring Provider:  Alan Mulder, MD  HPI:  Jamie Williams is a 58 y.o.  Female who presents today for evaluation and management of abnormal cervical cytology.    Dysplasia History:  07/13/2019: NILM, HPV+ 07/11/2018: NILM, HPV+ (HPV 16 +)  OB History  No obstetric history on file.    Past Medical History:  Diagnosis Date  . Anxiety   . Family history of adverse reaction to anesthesia    "sister had a difficult time waking up from mastectomy and passed away"   . GERD (gastroesophageal reflux disease)   . History of chicken pox   . History of kidney stones   . PONV (postoperative nausea and vomiting)    nausea  . Sciatic nerve pain     Past Surgical History:  Procedure Laterality Date  . ANTERIOR CERVICAL DECOMP/DISCECTOMY FUSION N/A 12/24/2015   Procedure: Cervical five-six, Cervical six-seven  Anterior cervical decompression/diskectomy/fusion/interbody prosthesis/plate;  Surgeon: Newman Pies, MD;  Location: [facility];  Service: Neurosurgery;  Laterality: N/A;  . COLONOSCOPY WITH PROPOFOL N/A 07/17/2018   Procedure: COLONOSCOPY WITH PROPOFOL;  Surgeon: Jonathon Bellows, MD;  Location: [facility] ENDOSCOPY;  Service: Gastroenterology;  Laterality: N/A;  . [place] and 1996   two Etopic preg.  Marland Kitchen Head Injuries  2006   surgery 2006 Cram/NS in [place]. Headaches with increased intracranial pressure. Repeat MRI in 2008 Negative  . SUPRACERVICAL ABDOMINAL HYSTERECTOMY  2011   Abdominal; Ovaries intact; CERVIX INTACT. Fibroids/dys menorrhea. [place]  . Ulnar Neuropathy Right 2004   Ulnar Neuropathy surgical revision. 2004-2008. [place].    SOCIAL HISTORY:  Social History   Substance and Sexual Activity  Alcohol Use Not Currently  . Alcohol/week: 0.0 standard drinks    Social History   Substance and Sexual Activity  Drug Use Not Currently     Family History  Problem Relation Age of Onset  . Hypertension Mother   . Diabetes Mother      Type 2  . Breast cancer Mother 18  . Cancer Sister 62       Breast  . Diabetes Sister        type 2  . Breast cancer Sister 2  . Diabetes Sister   . Diabetes Sister   . Diabetes Sister        type 2  . Liver cancer Brother   . Stomach cancer Brother     ALLERGIES:  Aspirin, Penicillins, and Sulfa antibiotics  Current Outpatient Medications on File Prior to Visit  Medication Sig Dispense Refill  . albuterol (PROVENTIL HFA;VENTOLIN HFA) 108 (90 Base) MCG/ACT inhaler Inhale 2 puffs into the lungs every 6 (six) hours as needed for wheezing or shortness of breath. 1 Inhaler 2  . amLODipine (NORVASC) 10 MG tablet TAKE 1 TABLET BY MOUTH EVERY DAY 90 tablet 3  . buPROPion (WELLBUTRIN SR) 150 MG 12 hr tablet TAKE 1 TABLET BY MOUTH TWICE A DAY 180 tablet 4  . cholecalciferol (VITAMIN D) 1000 units tablet Take 1,000 Units by mouth daily.    Marland Kitchen estradiol (ESTRACE) 2 MG tablet Take 1 tablet (2 mg total) by mouth daily. 90 tablet 3  . gabapentin (NEURONTIN) 300 MG capsule TAKE 1 CAPSULE BY MOUTH THREE TIMES A DAY 270 capsule 1  . HYDROcodone-acetaminophen (NORCO) 7.5-325 MG tablet Take 1 tablet by mouth every 6 (six) hours as needed for moderate pain. 30 tablet 0  . meloxicam (MOBIC) 15 MG tablet Take 1 tablet (15 mg total) by  mouth daily. with food 30 tablet 5  . PARoxetine (PAXIL) 30 MG tablet Take 2 tablets (60 mg total) by mouth daily. 180 tablet 0  . pravastatin (PRAVACHOL) 20 MG tablet Take 1 tablet (20 mg total) by mouth daily. 30 tablet 3  . promethazine (PHENERGAN) 25 MG tablet TAKE 1 TABLET BY MOUTH EVERY 4 TO 6 HOURS AS NEEDED FOR NAUSEA 30 tablet 5  . ranitidine (ZANTAC) 150 MG tablet RANITIDINE HCL, 150MG  (Oral Tablet)  1 Two Times A Day for heartburn, indigestion, nausea for 0 days  Quantity: 60.00;  Refills: 5   Ordered :22-Jul-2010  Reginia Forts MD;  Buddy Duty 28-Jan-2010 Active Comments: DX: 787.02     No current facility-administered medications on file prior to visit.      Physical Exam: -Vitals:  BP 124/78   Ht 5\' 5"  (1.651 m)   Wt 192 lb (87.1 kg)   BMI 31.95 kg/m  GEN: WD, WN, NAD.  A+ O x 3, good mood and affect. ABD:  NT, ND.  Soft, no masses.  No hernias noted.   Pelvic:   Vulva: Normal appearance.  No lesions.  Vagina: No lesions or abnormalities noted.  Support: Normal pelvic support.  Urethra No masses tenderness or scarring.  Meatus Normal size without lesions or prolapse.  Cervix: See below.  Anus: Normal exam.  No lesions.  Perineum: Normal exam.  No lesions.        Bimanual   Uterus: Normal size.  Non-tender.  Mobile.  AV.  Adnexae: No masses.  Non-tender to palpation.  Cul-de-sac: Negative for abnormality.   PROCEDURE: 1.  Urine Pregnancy Test:  not done 2.  Colposcopy performed with 4% acetic acid after verbal consent obtained                                         -Aceto-white Lesions Location(s): mildly diffusely               -Biopsy performed at 2, 4, 8, and 10 o'clock               -ECC indicated and performed: Yes.       -Biopsy sites made hemostatic with pressure and Monsel's solution   -Satisfactory colposcopy: No.    -Evidence of Invasive cervical CA :  NO  ASSESSMENT:  Jamie Williams is a 58 y.o. G1P1 female here for  1. Pap smear abnormality of cervix/human papillomavirus (HPV) positive   .  PLAN: I discussed the grading system of pap smears and HPV high risk viral types.  We will discuss and base management after colpo results return.      Prentice Docker, MD  [facility] 08/06/2019  10:58 AM   CC: [name], MD [address] [place],  [place] [address]

## 2019-08-08 LAB — SURGICAL PATHOLOGY

## 2019-08-09 ENCOUNTER — Telehealth: Payer: Self-pay | Admitting: Obstetrics and Gynecology

## 2019-08-09 NOTE — Telephone Encounter (Signed)
Left generic VM 

## 2019-08-10 ENCOUNTER — Other Ambulatory Visit: Payer: Self-pay

## 2019-08-10 DIAGNOSIS — S149XXA Injury of unspecified nerves of neck, initial encounter: Secondary | ICD-10-CM

## 2019-08-10 NOTE — Telephone Encounter (Signed)
Patient called requesting refills. [facility]. Thanks!

## 2019-08-12 ENCOUNTER — Other Ambulatory Visit: Payer: Self-pay | Admitting: Family Medicine

## 2019-08-14 ENCOUNTER — Encounter: Payer: Self-pay | Admitting: Obstetrics and Gynecology

## 2019-08-14 ENCOUNTER — Telehealth: Payer: Self-pay

## 2019-08-14 NOTE — Progress Notes (Signed)
Would you mind returning this patient's call and letting her know that the biopsies look normal with no pre-cancerous findings? The recommended follow up will be for a repeat pap smear in one year.  Thanks!

## 2019-08-14 NOTE — Telephone Encounter (Signed)
-----   Message from Jamie Bonnet, MD sent at 08/14/2019  1:13 PM EDT ----- Would you mind returning this patient's call and letting her know that the biopsies look normal with no pre-cancerous findings? The recommended follow up Jamie be for a repeat pap smear in one year.  Thanks!

## 2019-08-14 NOTE — Telephone Encounter (Signed)
Patient is calling to return missed call from [facility]. Patient is aware of SDJ is out of office today.

## 2019-08-14 NOTE — Telephone Encounter (Signed)
Lm with pt. Please let me know when pt returns my call.

## 2019-08-15 MED ORDER — HYDROCODONE-ACETAMINOPHEN 7.5-325 MG PO TABS: 1.0000 | ORAL_TABLET | Freq: Four times a day (QID) | ORAL | 0 refills | Status: DC | PRN

## 2019-08-15 NOTE — Telephone Encounter (Signed)
Please see my prior message. I don't know if this is a call back since it said "missed call." Please call her back. thanks

## 2019-08-15 NOTE — Telephone Encounter (Signed)
Tried to call pt again. No answer. Let me know when she calls back again

## 2019-08-15 NOTE — Telephone Encounter (Signed)
Patient is calling for result. Please advise

## 2019-08-17 NOTE — Telephone Encounter (Signed)
Please let me know when pt callsback ?

## 2019-08-20 NOTE — Telephone Encounter (Signed)
Patient calling to find out result. Per SDJ results were normal. Patient needs to follow up in one year for pap smear. Letter was sent out on 08/14/19 with all information. Patient aware.

## 2019-09-04 ENCOUNTER — Ambulatory Visit
Admission: RE | Admit: 2019-09-04 | Discharge: 2019-09-04 | Disposition: A | Discharge status: Home / Self Care | Payer: BC Managed Care – PPO | Source: Ambulatory Visit | Attending: Family Medicine | Admitting: Family Medicine

## 2019-09-04 DIAGNOSIS — Z1231 Encounter for screening mammogram for malignant neoplasm of breast: Secondary | ICD-10-CM | POA: Insufficient documentation

## 2019-09-07 ENCOUNTER — Other Ambulatory Visit: Payer: Self-pay | Admitting: *Deleted

## 2019-09-07 DIAGNOSIS — S149XXA Injury of unspecified nerves of neck, initial encounter: Secondary | ICD-10-CM

## 2019-09-09 MED ORDER — HYDROCODONE-ACETAMINOPHEN 7.5-325 MG PO TABS: 1.0000 | ORAL_TABLET | Freq: Four times a day (QID) | ORAL | 0 refills | Status: DC | PRN

## 2019-10-02 ENCOUNTER — Other Ambulatory Visit: Payer: Self-pay | Admitting: Family Medicine

## 2019-10-02 DIAGNOSIS — S149XXA Injury of unspecified nerves of neck, initial encounter: Secondary | ICD-10-CM

## 2019-10-02 MED ORDER — HYDROCODONE-ACETAMINOPHEN 7.5-325 MG PO TABS: 1.0000 | ORAL_TABLET | Freq: Four times a day (QID) | ORAL | 0 refills | Status: DC | PRN

## 2019-10-02 NOTE — Telephone Encounter (Signed)
HYDROcodone-acetaminophen (NORCO) 7.5-325 MG tablet    Patient is requesting refill.    [facility]  [id number] [place] [place] - [address] [phone] (Phone) [phone] (Fax)

## 2019-10-02 NOTE — Telephone Encounter (Signed)
LOV 07/13/2019 Last RF 09/09/2019

## 2019-10-02 NOTE — Telephone Encounter (Signed)
From [facility] 

## 2019-10-09 ENCOUNTER — Other Ambulatory Visit: Payer: Self-pay | Admitting: Family Medicine

## 2019-10-09 DIAGNOSIS — F329 Major depressive disorder, single episode, unspecified: Secondary | ICD-10-CM

## 2019-10-09 DIAGNOSIS — F32A Depression, unspecified: Secondary | ICD-10-CM

## 2019-10-15 ENCOUNTER — Encounter: Payer: Self-pay | Admitting: Family Medicine

## 2019-10-15 ENCOUNTER — Ambulatory Visit (INDEPENDENT_AMBULATORY_CARE_PROVIDER_SITE_OTHER): Payer: BC Managed Care – PPO | Admitting: Family Medicine

## 2019-10-15 ENCOUNTER — Other Ambulatory Visit: Payer: Self-pay

## 2019-10-15 VITALS — BP 144/96 | HR 83 | Temp 96.9°F | Wt 183.0 lb

## 2019-10-15 DIAGNOSIS — E559 Vitamin D deficiency, unspecified: Secondary | ICD-10-CM | POA: Diagnosis not present

## 2019-10-15 DIAGNOSIS — E7800 Pure hypercholesterolemia, unspecified: Secondary | ICD-10-CM

## 2019-10-15 DIAGNOSIS — I10 Essential (primary) hypertension: Secondary | ICD-10-CM | POA: Diagnosis not present

## 2019-10-15 NOTE — Progress Notes (Signed)
Patient: Jamie Williams Female    DOB: 14-May-1961   58 y.o.   MRN: [id number] Visit Date: 10/15/2019  Today's Provider: Lelon Huh, MD   Chief Complaint  Patient presents with  . Hyperlipidemia  . Vitamin D deficiency  . Hypertension   Subjective:     HPI  Lipid/Cholesterol, Follow-up:   Last seen for this 3 months ago.  Management changes since that visit include starting Pravastatin 20mg  daily.  Last Lipid Panel:    Component Value Date/Time   CHOL 223 (H) 07/13/2019 1104   TRIG 93 07/13/2019 1104   HDL 56 07/13/2019 1104   CHOLHDL 4.0 07/13/2019 1104   LDLCALC 151 (H) 07/13/2019 1104    Risk factors for vascular disease include hypercholesterolemia and hypertension  She reports good compliance with treatment. She is having side effects. Loss of appetite. Current symptoms include none  Weight trend: stable Prior visit with dietician: no Current diet: loss of appetite Current exercise: none  Wt Readings from Last 3 Encounters:  10/15/19 183 lb (83 kg)  08/06/19 192 lb (87.1 kg)  07/13/19 180 lb 6.4 oz (81.8 kg)    -------------------------------------------------------------------  Follow up for Vitamin D Deficiency:  The patient was last seen for this 3 months ago. Changes made at last visit include starting OTC Vitamin D3 2,000 units daily.  She reports good compliance with treatment. She feels that condition is Improved. She is not having side effects.   ------------------------------------------------------------------------------------  Hypertension, follow-up:  BP Readings from Last 3 Encounters:  10/15/19 (!) 176/95  08/06/19 124/78  07/13/19 (!) 144/90    She was last seen for hypertension 3 months ago.  BP at that visit was 144/90. Management since that visit includes no changes. She reports good compliance with treatment. She is not having side effects.  She is not exercising. She is adherent to low salt diet.     Outside blood pressures are being checked at home. She is experiencing none.  Patient denies chest pain, chest pressure/discomfort, irregular heart beat and palpitations.   Cardiovascular risk factors include dyslipidemia and hypertension.  Use of agents associated with hypertension: none.     Weight trend: decreasing steadily Wt Readings from Last 3 Encounters:  10/15/19 183 lb (83 kg)  08/06/19 192 lb (87.1 kg)  07/13/19 180 lb 6.4 oz (81.8 kg)    Current diet: loss of appetite   ------------------------------------------------------------------------  Allergies  Allergen Reactions  . Aspirin Nausea And Vomiting  . Penicillins Rash  . Sulfa Antibiotics Hives     Current Outpatient Medications:  .  albuterol (PROVENTIL HFA;VENTOLIN HFA) 108 (90 Base) MCG/ACT inhaler, Inhale 2 puffs into the lungs every 6 (six) hours as needed for wheezing or shortness of breath., Disp: 1 Inhaler, Rfl: 2 .  amLODipine (NORVASC) 10 MG tablet, TAKE 1 TABLET BY MOUTH EVERY DAY, Disp: 90 tablet, Rfl: 3 .  buPROPion (WELLBUTRIN SR) 150 MG 12 hr tablet, TAKE 1 TABLET BY MOUTH TWICE A DAY, Disp: 180 tablet, Rfl: 4 .  cholecalciferol (VITAMIN D) 1000 units tablet, Take 1,000 Units by mouth daily., Disp: , Rfl:  .  estradiol (ESTRACE) 2 MG tablet, Take 1 tablet (2 mg total) by mouth daily., Disp: 90 tablet, Rfl: 3 .  gabapentin (NEURONTIN) 300 MG capsule, TAKE 1 CAPSULE BY MOUTH THREE TIMES A DAY, Disp: 270 capsule, Rfl: 1 .  HYDROcodone-acetaminophen (NORCO) 7.5-325 MG tablet, Take 1 tablet by mouth every 6 (six) hours as needed for  moderate pain., Disp: 30 tablet, Rfl: 0 .  meloxicam (MOBIC) 15 MG tablet, TAKE 1 TABLET BY MOUTH EVERY DAY WITH FOOD, Disp: 30 tablet, Rfl: 5 .  PARoxetine (PAXIL) 30 MG tablet, TAKE 2 TABLETS (60 MG TOTAL) BY MOUTH DAILY., Disp: 180 tablet, Rfl: 1 .  pravastatin (PRAVACHOL) 20 MG tablet, TAKE 1 TABLET BY MOUTH EVERY DAY, Disp: 90 tablet, Rfl: 1 .  promethazine (PHENERGAN)  25 MG tablet, TAKE 1 TABLET BY MOUTH EVERY 4 TO 6 HOURS AS NEEDED FOR NAUSEA, Disp: 30 tablet, Rfl: 5 .  ranitidine (ZANTAC) 150 MG tablet, RANITIDINE HCL, 150MG  (Oral Tablet)  1 Two Times A Day for heartburn, indigestion, nausea for 0 days  Quantity: 60.00;  Refills: 5   Ordered :22-Jul-2010  Reginia Forts MD;  Buddy Duty 28-Jan-2010 Active Comments: DX: 787.02, Disp: , Rfl:   Review of Systems  Constitutional: Negative for appetite change, chills, fatigue and fever.  Respiratory: Negative for chest tightness and shortness of breath.   Cardiovascular: Negative for chest pain and palpitations.  Gastrointestinal: Negative for abdominal pain, nausea and vomiting.  Neurological: Negative for dizziness and weakness.    Social History   Tobacco Use  . Smoking status: Current Every Day Smoker    Packs/day: 0.50  . Smokeless tobacco: Never Used  . Tobacco comment: Started about 58 years old usually 1/2 ppd  Substance Use Topics  . Alcohol use: Not Currently    Alcohol/week: 0.0 standard drinks      Objective:    Vitals:   10/15/19 1055 10/15/19 1103  BP: (!) 176/95 (!) 144/96  Pulse: 83   Temp: (!) 96.9 F (36.1 C)   TempSrc: Temporal   SpO2: 99%   Weight: 183 lb (83 kg)   Body mass index is 30.45 kg/m.   Physical Exam   General Appearance:    Overweight female in no acute distress  Eyes:    PERRL, conjunctiva/corneas clear, EOM's intact       Lungs:     Clear to auscultation bilaterally, respirations unlabored  Heart:    Normal heart rate. Normal rhythm. No murmurs, rubs, or gallops.   MS:   All extremities are intact.   Neurologic:   Awake, alert, oriented x 3. No apparent focal neurological           defect.           Assessment & Plan    1. Benign essential HTN BP up today but she reports home BP is usually under 140/90. Continue current medications.   - Lipid panel (fasting) - TSH  2. Vitamin D deficiency Tolerating initiation of vitamin d supplementation. -  Vitamin D (25 hydroxy)  3. Pure hypercholesterolemia She is tolerating initiation of pravastatin well with no adverse effects.   - Comprehensive metabolic panel - Lipid panel (fasting)  The entirety of the information documented in the History of Present Illness, Review of Systems and Physical Exam were personally obtained by me. Portions of this information were initially documented by Idelle Jo, CMA and reviewed by me for thoroughness and accuracy.     Lelon Huh, MD  [facility]

## 2019-10-16 LAB — TSH: TSH: 2.79 u[IU]/mL (ref 0.450–4.500)

## 2019-10-16 LAB — COMPREHENSIVE METABOLIC PANEL
ALT: 13 IU/L (ref 0–32)
AST: 14 IU/L (ref 0–40)
Albumin/Globulin Ratio: 2 (ref 1.2–2.2)
Albumin: 4.5 g/dL (ref 3.8–4.9)
Alkaline Phosphatase: 133 IU/L — ABNORMAL HIGH (ref 39–117)
BUN/Creatinine Ratio: 11 (ref 9–23)
BUN: 10 mg/dL (ref 6–24)
Bilirubin Total: 0.6 mg/dL (ref 0.0–1.2)
CO2: 21 mmol/L (ref 20–29)
Calcium: 9.8 mg/dL (ref 8.7–10.2)
Chloride: 106 mmol/L (ref 96–106)
Creatinine, Ser: 0.9 mg/dL (ref 0.57–1.00)
GFR calc Af Amer: 83 mL/min/{1.73_m2} (ref >59)
GFR calc non Af Amer: 72 mL/min/{1.73_m2} (ref >59)
Globulin, Total: 2.3 g/dL (ref 1.5–4.5)
Glucose: 97 mg/dL (ref 65–99)
Potassium: 3.7 mmol/L (ref 3.5–5.2)
Sodium: 143 mmol/L (ref 134–144)
Total Protein: 6.8 g/dL (ref 6.0–8.5)

## 2019-10-16 LAB — LIPID PANEL
Chol/HDL Ratio: 3.4 ratio (ref 0.0–4.4)
Cholesterol, Total: 184 mg/dL (ref 100–199)
HDL: 54 mg/dL (ref >39)
LDL Chol Calc (NIH): 112 mg/dL — ABNORMAL HIGH (ref 0–99)
Triglycerides: 99 mg/dL (ref 0–149)
VLDL Cholesterol Cal: 18 mg/dL (ref 5–40)

## 2019-10-16 LAB — VITAMIN D 25 HYDROXY (VIT D DEFICIENCY, FRACTURES): Vit D, 25-Hydroxy: 28.1 ng/mL — ABNORMAL LOW (ref 30.0–100.0)

## 2019-10-24 ENCOUNTER — Other Ambulatory Visit: Payer: Self-pay | Admitting: Family Medicine

## 2019-10-24 DIAGNOSIS — R202 Paresthesia of skin: Secondary | ICD-10-CM

## 2019-10-24 DIAGNOSIS — S149XXA Injury of unspecified nerves of neck, initial encounter: Secondary | ICD-10-CM

## 2019-10-24 NOTE — Telephone Encounter (Signed)
Copied from [place] [phone] [id number]. Topic: Quick Communication - Rx Refill/Question >> Oct 24, 2019  2:56 PM Jamie Williams, [place] A wrote: Medication: HYDROcodone-acetaminophen (NORCO) 7.5-325 MG tablet (Patient is completely out of medication)  Has the patient contacted their pharmacy? Yes (Agent: If no, request that the patient contact the pharmacy for the refill.) (Agent: If yes, when and what did the pharmacy advise?)Contact PCP  Preferred Pharmacy (with phone number or street name): [id number] [place], [place] - [address]  Phone:  [phone] Fax:  [phone]     Agent: Please be advised that RX refills may take up to 3 business days. We ask that you follow-up with your pharmacy.

## 2019-10-24 NOTE — Telephone Encounter (Signed)
Requested medication (s) are due for refill today: yes  Requested medication (s) are on the active medication list: yes  Last refill: 10/02/2019  Future visit scheduled: yes  Notes to clinic:  This refill cannot be delegated  Patient is completely out    Requested Prescriptions  Pending Prescriptions Disp Refills   HYDROcodone-acetaminophen (NORCO) 7.5-325 MG tablet 30 tablet 0    Sig: Take 1 tablet by mouth every 6 (six) hours as needed for moderate pain.      Not Delegated - Analgesics:  Opioid Agonist Combinations Failed - 10/24/2019  3:14 PM      Failed - This refill cannot be delegated      Failed - Urine Drug Screen completed in last 360 days.      Passed - Valid encounter within last 6 months    Recent Outpatient Visits           1 week ago Benign essential HTN   [facility] [name], MD   3 months ago Benign essential HTN   [facility] [name], MD   7 months ago Menopausal symptoms   [facility] [name], MD   1 year ago [place], Donald E, MD   1 year ago Annual physical exam   [facility] [name], MD       Future Appointments             In 5 months Fisher, Kirstie Peri, MD [facility], [place]

## 2019-10-25 MED ORDER — HYDROCODONE-ACETAMINOPHEN 7.5-325 MG PO TABS: 1.0000 | ORAL_TABLET | Freq: Four times a day (QID) | ORAL | 0 refills | Status: DC | PRN

## 2019-11-09 ENCOUNTER — Other Ambulatory Visit: Payer: Self-pay | Admitting: Family Medicine

## 2019-11-09 DIAGNOSIS — S149XXA Injury of unspecified nerves of neck, initial encounter: Secondary | ICD-10-CM

## 2019-11-09 NOTE — Telephone Encounter (Signed)
Medication Refill - Medication: HYDROcodone-acetaminophen (NORCO) 7.5-325 MG tablet    Preferred Pharmacy (with phone number or street name):  [id number] [place], [place] - [address] Phone:  [phone]  Fax:  [phone]       Agent: Please be advised that RX refills may take up to 3 business days. We ask that you follow-up with your pharmacy.

## 2019-11-10 MED ORDER — HYDROCODONE-ACETAMINOPHEN 7.5-325 MG PO TABS: 1.0000 | ORAL_TABLET | Freq: Four times a day (QID) | ORAL | 0 refills | Status: DC | PRN

## 2019-11-21 ENCOUNTER — Other Ambulatory Visit: Payer: Self-pay | Admitting: Family Medicine

## 2019-11-21 DIAGNOSIS — S149XXA Injury of unspecified nerves of neck, initial encounter: Secondary | ICD-10-CM

## 2019-11-21 NOTE — Telephone Encounter (Signed)
HYDROcodone-acetaminophen (NORCO) 7.5-325 MG tablet  promethazine (PHENERGAN) 25 MG tablet     Patient is requesting refills.    [facility]  [id number] [place], [place] - [address] Phone:  [phone]  Fax:  [phone]

## 2019-11-21 NOTE — Telephone Encounter (Signed)
Requested medication (s) are due for refill today: no  Requested medication (s) are on the active medication list: yes  Last refill:  11/10/2019  Future visit scheduled: yes  Notes to clinic:  this refill cannot be delegated    Requested Prescriptions  Pending Prescriptions Disp Refills   promethazine (PHENERGAN) 25 MG tablet 30 tablet 5    Sig: TAKE 1 TABLET BY MOUTH EVERY 4 TO 6 HOURS AS NEEDED FOR NAUSEA      Not Delegated - Gastroenterology: Antiemetics Failed - 11/21/2019  2:21 PM      Failed - This refill cannot be delegated      Passed - Valid encounter within last 6 months    Recent Outpatient Visits           1 month ago Benign essential HTN   [facility] [name], MD   4 months ago Benign essential HTN   [facility] [name], MD   8 months ago Menopausal symptoms   [facility] [name], MD   1 year ago [place], Donald E, MD   1 year ago Annual physical exam   [facility] [name], MD       Future Appointments             In 4 months Fisher, Kirstie Peri, MD [facility]              HYDROcodone-acetaminophen (NORCO) 7.5-325 MG tablet 30 tablet 0    Sig: Take 1 tablet by mouth every 6 (six) hours as needed for moderate pain.      Not Delegated - Analgesics:  Opioid Agonist Combinations Failed - 11/21/2019  2:21 PM      Failed - This refill cannot be delegated      Failed - Urine Drug Screen completed in last 360 days.      Passed - Valid encounter within last 6 months    Recent Outpatient Visits           1 month ago Benign essential HTN   [facility] [name], MD   4 months ago Benign essential HTN   [facility] [name], MD   8 months ago Menopausal symptoms   [facility] [name], MD   1 year ago [place], Donald E, MD   1 year ago Annual physical exam   [facility] [name], MD       Future Appointments             In 4 months Fisher, Kirstie Peri, MD [facility], [place]

## 2019-11-22 MED ORDER — PROMETHAZINE HCL 25 MG PO TABS: ORAL_TABLET | ORAL | 5 refills | Status: DC

## 2019-11-22 MED ORDER — HYDROCODONE-ACETAMINOPHEN 7.5-325 MG PO TABS: 1.0000 | ORAL_TABLET | Freq: Four times a day (QID) | ORAL | 0 refills | Status: DC | PRN

## 2019-12-05 ENCOUNTER — Other Ambulatory Visit: Payer: Self-pay | Admitting: Family Medicine

## 2019-12-05 DIAGNOSIS — S149XXA Injury of unspecified nerves of neck, initial encounter: Secondary | ICD-10-CM

## 2019-12-05 MED ORDER — HYDROCODONE-ACETAMINOPHEN 7.5-325 MG PO TABS: 1.0000 | ORAL_TABLET | Freq: Four times a day (QID) | ORAL | 0 refills | Status: DC | PRN

## 2019-12-05 NOTE — Telephone Encounter (Signed)
Copied from [place] [phone]. Topic: Quick Communication - Rx Refill/Question >> Dec 05, 2019  3:04 PM Yvette Rack wrote: Medication: HYDROcodone-acetaminophen ([place]) 7.5-325 MG tablet  Has the patient contacted their pharmacy? no  Preferred Pharmacy (with phone number or street name): [id number] [place], [place] - [address]  Phone: [phone]   Fax: [phone]  Agent: Please be advised that RX refills may take up to 3 business days. We ask that you follow-up with your pharmacy.

## 2019-12-05 NOTE — Telephone Encounter (Signed)
Requested medication (s) are due for refill today: yes  Requested medication (s) are on the active medication list: yes  Last refill:  11/22/19  Future visit scheduled: yes  Notes to clinic:  not delegated    Requested Prescriptions  Pending Prescriptions Disp Refills   HYDROcodone-acetaminophen (NORCO) 7.5-325 MG tablet 30 tablet 0    Sig: Take 1 tablet by mouth every 6 (six) hours as needed for moderate pain.      Not Delegated - Analgesics:  Opioid Agonist Combinations Failed - 12/05/2019  3:09 PM      Failed - This refill cannot be delegated      Failed - Urine Drug Screen completed in last 360 days.      Passed - Valid encounter within last 6 months    Recent Outpatient Visits           1 month ago Benign essential HTN   [facility] [name], MD   4 months ago Benign essential HTN   [facility] [name], MD   8 months ago Menopausal symptoms   [facility] [name], MD   1 year ago [place], Donald E, MD   1 year ago Annual physical exam   [facility] [name], MD       Future Appointments             In 4 months Fisher, Kirstie Peri, MD [facility], [place]

## 2019-12-20 ENCOUNTER — Other Ambulatory Visit: Payer: Self-pay | Admitting: Family Medicine

## 2019-12-20 DIAGNOSIS — S149XXA Injury of unspecified nerves of neck, initial encounter: Secondary | ICD-10-CM

## 2019-12-20 NOTE — Telephone Encounter (Signed)
Refill request for HYDROcodone-acetaminophen (NORCO) 7.5-325 MG tablet. LR 12/05/19

## 2019-12-20 NOTE — Telephone Encounter (Signed)
Medication Refill - Medication: hydrocodone  Has the patient contacted their pharmacy? Yes.   (Agent: If no, request that the patient contact the pharmacy for the refill.) (Agent: If yes, when and what did the pharmacy advise?)  Preferred Pharmacy (with phone number or street name):  [id number] [place], [place] [address]  [address] [place] [address]  Phone: [phone] Fax: [phone]  Not a 24 hour pharmacy; exact hours not known.     Agent: Please be advised that RX refills may take up to 3 business days. We ask that you follow-up with your pharmacy.

## 2019-12-22 MED ORDER — HYDROCODONE-ACETAMINOPHEN 7.5-325 MG PO TABS: 1.0000 | ORAL_TABLET | Freq: Four times a day (QID) | ORAL | 0 refills | Status: DC | PRN

## 2020-01-04 ENCOUNTER — Other Ambulatory Visit: Payer: Self-pay | Admitting: Family Medicine

## 2020-01-04 DIAGNOSIS — S149XXA Injury of unspecified nerves of neck, initial encounter: Secondary | ICD-10-CM

## 2020-01-04 NOTE — Telephone Encounter (Signed)
Pt called in to request a refill for HYDROcodone-acetaminophen ([place]) 7.5-325 MG tablet    [id number] , [place] - [address] Phone:  [phone]  Fax:  [phone]

## 2020-01-05 MED ORDER — HYDROCODONE-ACETAMINOPHEN 7.5-325 MG PO TABS: 1.0000 | ORAL_TABLET | Freq: Four times a day (QID) | ORAL | 0 refills | Status: DC | PRN

## 2020-01-17 ENCOUNTER — Other Ambulatory Visit: Payer: Self-pay | Admitting: Family Medicine

## 2020-01-17 DIAGNOSIS — S149XXA Injury of unspecified nerves of neck, initial encounter: Secondary | ICD-10-CM

## 2020-01-17 NOTE — Telephone Encounter (Signed)
Medication Refill - Medication: HYDROcodone-acetaminophen (NORCO) 7.5-325 MG tablet    Has the patient contacted their pharmacy? No. (Agent: If no, request that the patient contact the pharmacy for the refill.) (Agent: If yes, when and what did the pharmacy advise?)  Preferred Pharmacy (with phone number or street name):[id number] [place], [place] - [address]  [address], [place] [address]  Phone:  [phone] Fax:  [phone]   Agent: Please be advised that RX refills may take up to 3 business days. We ask that you follow-up with your pharmacy.

## 2020-01-18 MED ORDER — HYDROCODONE-ACETAMINOPHEN 7.5-325 MG PO TABS: 1.0000 | ORAL_TABLET | Freq: Four times a day (QID) | ORAL | 0 refills | Status: DC | PRN

## 2020-01-30 ENCOUNTER — Other Ambulatory Visit: Payer: Self-pay | Admitting: Family Medicine

## 2020-01-30 DIAGNOSIS — S149XXA Injury of unspecified nerves of neck, initial encounter: Secondary | ICD-10-CM

## 2020-01-30 MED ORDER — BUPROPION HCL ER (SR) 150 MG PO TB12: 150.0000 mg | ORAL_TABLET | Freq: Two times a day (BID) | ORAL | 0 refills | Status: DC

## 2020-01-30 NOTE — Telephone Encounter (Signed)
Requested medication (s) are due for refill today - if to continue  Requested medication (s) are on the active medication list -yes  Future visit scheduled -yes  Last refill: 01/18/20  Notes to clinic: request for non delegated Rx  Requested Prescriptions  Pending Prescriptions Disp Refills   HYDROcodone-acetaminophen (NORCO) 7.5-325 MG tablet 30 tablet 0    Sig: Take 1 tablet by mouth every 6 (six) hours as needed for moderate pain.      Not Delegated - Analgesics:  Opioid Agonist Combinations Failed - 01/30/2020  2:24 PM      Failed - This refill cannot be delegated      Failed - Urine Drug Screen completed in last 360 days.      Passed - Valid encounter within last 6 months    Recent Outpatient Visits           3 months ago Benign essential HTN   [facility] [name], MD   6 months ago Benign essential HTN   [facility] [name], MD   10 months ago Menopausal symptoms   [facility] [name], MD   1 year ago [place], Donald E, MD   1 year ago Annual physical exam   [place], MD       Future Appointments             In 2 months Fisher, Kirstie Peri, MD [facility], [facility]             Signed Prescriptions Disp Refills   buPROPion [employer] [facility] SR) 150 MG 12 hr tablet 180 tablet 0    Sig: Take 1 tablet (150 mg total) by mouth 2 (two) times daily.      Psychiatry: Antidepressants - bupropion Failed - 01/30/2020  2:24 PM      Failed - Last BP in normal range    BP Readings from Last 1 Encounters:  10/15/19 (!) 144/96          Passed - Completed PHQ-2 or PHQ-9 in the last 360 days.      Passed - Valid encounter within last 6 months    Recent Outpatient Visits           3 months ago Benign essential HTN   [facility] [name], MD   6 months ago Benign essential HTN   [facility] [name], MD   10 months ago Menopausal symptoms   [facility] [name], MD   1 year ago [place], Donald E, MD   1 year ago Annual physical exam   [place], MD       Future Appointments             In 2 months Fisher, Kirstie Peri, MD [facility], [facility]                Requested Prescriptions  Pending Prescriptions Disp Refills   HYDROcodone-acetaminophen (NORCO) 7.5-325 MG tablet 30 tablet 0    Sig: Take 1 tablet by mouth every 6 (six) hours as needed for moderate pain.      Not Delegated - Analgesics:  Opioid Agonist Combinations Failed - 01/30/2020  2:24 PM      Failed - This refill cannot be delegated      Failed - Urine Drug Screen  completed in last 360 days.      Passed - Valid encounter within last 6 months    Recent Outpatient Visits           3 months ago Benign essential HTN   [facility] [name], MD   6 months ago Benign essential HTN   [facility] [name], MD   10 months ago Menopausal symptoms   [facility] [name], MD   1 year ago [place], Donald E, MD   1 year ago Annual physical exam   [place], MD       Future Appointments             In 2 months Fisher, Kirstie Peri, MD [facility], [facility]             Signed Prescriptions Disp Refills   buPROPion [facility] SR) 150 MG 12 hr tablet 180 tablet 0    Sig: Take 1 tablet (150 mg total) by mouth 2 (two) times daily.      Psychiatry: Antidepressants - bupropion Failed - 01/30/2020  2:24 PM      Failed - Last BP in normal range    BP Readings from Last 1 Encounters:  10/15/19 (!) 144/96          Passed - Completed PHQ-2 or PHQ-9 in the last 360 days.      Passed - Valid encounter within last 6 months    Recent  Outpatient Visits           3 months ago Benign essential HTN   [facility] [name], MD   6 months ago Benign essential HTN   [facility] [name], MD   10 months ago Menopausal symptoms   [facility] [name], MD   1 year ago [place], Donald E, MD   1 year ago Annual physical exam   [facility] [name], MD       Future Appointments             In 2 months Fisher, Kirstie Peri, MD [facility], [place]

## 2020-01-30 NOTE — Telephone Encounter (Signed)
Pt has an appt with dr Caryn Section on 04-15-2020. Pt needs a refill on hydrocodone and bupropion. [facility] [address] in [place]

## 2020-01-30 NOTE — Telephone Encounter (Signed)
Appointment 04/15/20

## 2020-01-31 MED ORDER — HYDROCODONE-ACETAMINOPHEN 7.5-325 MG PO TABS: 1.0000 | ORAL_TABLET | Freq: Four times a day (QID) | ORAL | 0 refills | Status: DC | PRN

## 2020-02-11 ENCOUNTER — Other Ambulatory Visit: Payer: Self-pay | Admitting: Family Medicine

## 2020-02-11 DIAGNOSIS — S149XXA Injury of unspecified nerves of neck, initial encounter: Secondary | ICD-10-CM

## 2020-02-11 NOTE — Telephone Encounter (Signed)
Requested medication (s) are due for refill today: yes  Requested medication (s) are on the active medication list: yes  Last refill: 01/31/20  #30  0 refills  Future visit scheduled  Yes   04/15/20  Notes to clinic:not delegated  Requested Prescriptions  Pending Prescriptions Disp Refills   HYDROcodone-acetaminophen (NORCO) 7.5-325 MG tablet 30 tablet 0    Sig: Take 1 tablet by mouth every 6 (six) hours as needed for moderate pain.      Not Delegated - Analgesics:  Opioid Agonist Combinations Failed - 02/11/2020 10:45 AM      Failed - This refill cannot be delegated      Failed - Urine Drug Screen completed in last 360 days.      Passed - Valid encounter within last 6 months    Recent Outpatient Visits           3 months ago Benign essential HTN   [facility] [name], MD   7 months ago Benign essential HTN   [facility] [name], MD   10 months ago Menopausal symptoms   [facility] [name], MD   1 year ago [place], Donald E, MD   1 year ago Annual physical exam   [facility] [name], MD       Future Appointments             In 2 months Fisher, Kirstie Peri, MD [facility], [place]

## 2020-02-11 NOTE — Telephone Encounter (Signed)
Medication Refill - Medication: HYDROcodone-acetaminophen (NORCO) 7.5-325 MG tablet    Has the patient contacted their pharmacy? No. (Agent: If no, request that the patient contact the pharmacy for the refill.) (Agent: If yes, when and what did the pharmacy advise?)  Preferred Pharmacy (with phone number or street name):  [facility] [id number], [place] Phone:  [phone]  Fax:  [phone]       Agent: Please be advised that RX refills may take up to 3 business days. We ask that you follow-up with your pharmacy.

## 2020-02-12 MED ORDER — HYDROCODONE-ACETAMINOPHEN 7.5-325 MG PO TABS: 1.0000 | ORAL_TABLET | Freq: Four times a day (QID) | ORAL | 0 refills | Status: DC | PRN

## 2020-02-14 ENCOUNTER — Ambulatory Visit: Payer: Medicare Other | Attending: Internal Medicine

## 2020-02-14 DIAGNOSIS — Z23 Encounter for immunization: Secondary | ICD-10-CM

## 2020-02-14 NOTE — Progress Notes (Signed)
   Covid-19 Vaccination Clinic  Name:  Jamie Williams    MRN: [id number] DOB: [birth year]  02/14/2020  [name] was observed post Covid-19 immunization for 15 minutes without incident. She was provided with Vaccine Information Sheet and instruction to access the V-Safe system.   [name] was instructed to call 911 with any severe reactions post vaccine: Marland Kitchen Difficulty breathing  . Swelling of face and throat  . A fast heartbeat  . A bad rash all over body  . Dizziness and weakness   Immunizations Administered    Name Date Dose VIS Date Route   Pfizer COVID-19 Vaccine 02/14/2020 10:42 AM 0.3 mL 12/19/2018 Intramuscular   Manufacturer: [place]   Lot: [id number]   [place]: [id number]

## 2020-02-20 ENCOUNTER — Other Ambulatory Visit: Payer: Self-pay | Admitting: Family Medicine

## 2020-02-20 DIAGNOSIS — S149XXA Injury of unspecified nerves of neck, initial encounter: Secondary | ICD-10-CM

## 2020-02-20 MED ORDER — HYDROCODONE-ACETAMINOPHEN 7.5-325 MG PO TABS: 1.0000 | ORAL_TABLET | Freq: Four times a day (QID) | ORAL | 0 refills | Status: DC | PRN

## 2020-02-20 NOTE — Telephone Encounter (Signed)
Pt needs refill on hydrocodone , [facility] [address] in [place]

## 2020-02-20 NOTE — Telephone Encounter (Signed)
Requested medication (s) are due for refill today: yes  Requested medication (s) are on the active medication list: yes  Last refill: 02/11/20   #30 - Daneil Dolin  Future visit scheduled: yes  Notes to clinic: Not delegated    Requested Prescriptions  Pending Prescriptions Disp Refills   HYDROcodone-acetaminophen (NORCO) 7.5-325 MG tablet 30 tablet 0    Sig: Take 1 tablet by mouth every 6 (six) hours as needed for moderate pain.      Not Delegated - Analgesics:  Opioid Agonist Combinations Failed - 02/20/2020  2:47 PM      Failed - This refill cannot be delegated      Failed - Urine Drug Screen completed in last 360 days.      Passed - Valid encounter within last 6 months    Recent Outpatient Visits           4 months ago Benign essential HTN   [facility] [name], MD   7 months ago Benign essential HTN   [facility] [name], MD   11 months ago Menopausal symptoms   [facility] [name], MD   1 year ago [place], Donald E, MD   1 year ago Annual physical exam   [facility] [name], MD       Future Appointments             In 1 month Fisher, Kirstie Peri, MD [facility], [place]

## 2020-02-21 ENCOUNTER — Other Ambulatory Visit: Payer: Self-pay | Admitting: Family Medicine

## 2020-02-21 NOTE — Telephone Encounter (Signed)
Requested Prescriptions  Pending Prescriptions Disp Refills  . meloxicam (MOBIC) 15 MG tablet [Pharmacy Med Name: MELOXICAM 15 MG TABLET] 90 tablet 0    Sig: TAKE 1 TABLET BY MOUTH EVERY DAY WITH FOOD     Analgesics:  COX2 Inhibitors Passed - 02/21/2020  5:05 PM      Passed - HGB in normal range and within 360 days    Hemoglobin  Date Value Ref Range Status  07/13/2019 14.8 11.1 - 15.9 g/dL Final         Passed - Cr in normal range and within 360 days    Creatinine  Date Value Ref Range Status  11/23/2014 0.93 0.60 - 1.30 mg/dL Final   Creatinine, Ser  Date Value Ref Range Status  10/15/2019 0.90 0.57 - 1.00 mg/dL Final         Passed - Patient is not pregnant      Passed - Valid encounter within last 12 months    Recent Outpatient Visits          4 months ago Benign essential HTN   [facility] [name], MD   7 months ago Benign essential HTN   [facility] [name], MD   11 months ago Menopausal symptoms   [facility] [name], MD   1 year ago [place], Donald E, MD   1 year ago Annual physical exam   [facility] [name], MD      Future Appointments            In 1 month Fisher, Kirstie Peri, MD [facility], [place]

## 2020-02-28 ENCOUNTER — Other Ambulatory Visit: Payer: Self-pay | Admitting: Family Medicine

## 2020-02-28 DIAGNOSIS — S149XXA Injury of unspecified nerves of neck, initial encounter: Secondary | ICD-10-CM

## 2020-02-28 MED ORDER — HYDROCODONE-ACETAMINOPHEN 7.5-325 MG PO TABS: 1.0000 | ORAL_TABLET | Freq: Four times a day (QID) | ORAL | 0 refills | Status: DC | PRN

## 2020-02-28 NOTE — Telephone Encounter (Signed)
Medication: HYDROcodone-acetaminophen (NORCO) 7.5-325 MG tablet [id number]  Patient is requesting a refill - Last refill was sent 02/20/20 for 30 tablets  Has the patient contacted their pharmacy?Yes  (Agent: If no, request that the patient contact the pharmacy for the refill.) (Agent: If yes, when and what did the pharmacy advise?)  Preferred Pharmacy (with phone number or street name): [facility] [id number], [place]  Phone:  [phone] Fax:  [phone]     Agent: Please be advised that RX refills may take up to 3 business days. We ask that you follow-up with your pharmacy.

## 2020-02-28 NOTE — Telephone Encounter (Signed)
Requested medication (s) are due for refill today - possible short term/acute Rx  Requested medication (s) are on the active medication list -yes  Future visit scheduled -yes  Last refill: 02/20/20  Notes to clinic: Request for non delegated Rx  Requested Prescriptions  Pending Prescriptions Disp Refills   HYDROcodone-acetaminophen (NORCO) 7.5-325 MG tablet 30 tablet 0    Sig: Take 1 tablet by mouth every 6 (six) hours as needed for moderate pain.      Not Delegated - Analgesics:  Opioid Agonist Combinations Failed - 02/28/2020  2:32 PM      Failed - This refill cannot be delegated      Failed - Urine Drug Screen completed in last 360 days.      Passed - Valid encounter within last 6 months    Recent Outpatient Visits           4 months ago Benign essential HTN   [facility] [name], MD   7 months ago Benign essential HTN   [facility] [name], MD   11 months ago Menopausal symptoms   [facility] [name], MD   1 year ago [place], Donald E, MD   1 year ago Annual physical exam   [place], MD       Future Appointments             In 1 month Fisher, Kirstie Peri, MD [facility], [facility]                Requested Prescriptions  Pending Prescriptions Disp Refills   HYDROcodone-acetaminophen (NORCO) 7.5-325 MG tablet 30 tablet 0    Sig: Take 1 tablet by mouth every 6 (six) hours as needed for moderate pain.      Not Delegated - Analgesics:  Opioid Agonist Combinations Failed - 02/28/2020  2:32 PM      Failed - This refill cannot be delegated      Failed - Urine Drug Screen completed in last 360 days.      Passed - Valid encounter within last 6 months    Recent Outpatient Visits           4 months ago Benign essential HTN   [facility] [name], MD   7 months ago Benign essential HTN   [facility] [name], MD   11 months ago Menopausal symptoms   [facility] [name], MD   1 year ago [place], Donald E, MD   1 year ago Annual physical exam   [facility] [name], MD       Future Appointments             In 1 month Fisher, Kirstie Peri, MD [facility] [address], [place]

## 2020-03-10 ENCOUNTER — Other Ambulatory Visit: Payer: Self-pay | Admitting: Family Medicine

## 2020-03-10 DIAGNOSIS — S149XXA Injury of unspecified nerves of neck, initial encounter: Secondary | ICD-10-CM

## 2020-03-10 NOTE — Telephone Encounter (Signed)
Requested medication (s) are due for refill today: Yes  Requested medication (s) are on the active medication list: Yes  Last refill:  02/28/20  Future visit scheduled: Yes  Notes to clinic:  See request.    Requested Prescriptions  Pending Prescriptions Disp Refills   HYDROcodone-acetaminophen (NORCO) 7.5-325 MG tablet 30 tablet 0    Sig: Take 1 tablet by mouth every 6 (six) hours as needed for moderate pain.      Not Delegated - Analgesics:  Opioid Agonist Combinations Failed - 03/10/2020  2:11 PM      Failed - This refill cannot be delegated      Failed - Urine Drug Screen completed in last 360 days.      Passed - Valid encounter within last 6 months    Recent Outpatient Visits           4 months ago Benign essential HTN   [facility] [name], MD   8 months ago Benign essential HTN   [facility] [name], MD   11 months ago Menopausal symptoms   [facility] [name], MD   1 year ago [place], Donald E, MD   1 year ago Annual physical exam   [facility] [name], MD       Future Appointments             In 1 month Fisher, Kirstie Peri, MD [facility], [place]

## 2020-03-10 NOTE — Telephone Encounter (Signed)
Medication Refill - Medication: HYDROcodone-acetaminophen (NORCO) 7.5-325 MG tablet    Has the patient contacted their pharmacy? No. (Agent: If no, request that the patient contact the pharmacy for the refill.) (Agent: If yes, when and what did the pharmacy advise?)  Preferred Pharmacy (with phone number or street name):  [facility] [id number] [address], [place] Phone:  [phone]  Fax:  [phone]       Agent: Please be advised that RX refills may take up to 3 business days. We ask that you follow-up with your pharmacy.

## 2020-03-11 ENCOUNTER — Ambulatory Visit: Payer: Medicare Other | Attending: Internal Medicine

## 2020-03-11 DIAGNOSIS — Z23 Encounter for immunization: Secondary | ICD-10-CM

## 2020-03-11 MED ORDER — HYDROCODONE-ACETAMINOPHEN 7.5-325 MG PO TABS: 1.0000 | ORAL_TABLET | Freq: Four times a day (QID) | ORAL | 0 refills | Status: DC | PRN

## 2020-03-11 NOTE — Progress Notes (Signed)
   Covid-19 Vaccination Clinic  Name:  Jamie Williams    MRN: [id number] DOB: [birth year]  03/11/2020  [name] was observed post Covid-19 immunization for 15 minutes without incident. She was provided with Vaccine Information Sheet and instruction to access the V-Safe system.   [name] was instructed to call 911 with any severe reactions post vaccine: Marland Kitchen Difficulty breathing  . Swelling of face and throat  . A fast heartbeat  . A bad rash all over body  . Dizziness and weakness   Immunizations Administered    Name Date Dose VIS Date Route   Pfizer COVID-19 Vaccine 03/11/2020  9:48 AM 0.3 mL 12/19/2018 Intramuscular   Manufacturer: [place]   Lot: [id number]   [place]: [id number]

## 2020-03-21 ENCOUNTER — Other Ambulatory Visit: Payer: Self-pay | Admitting: Family Medicine

## 2020-03-21 DIAGNOSIS — S149XXA Injury of unspecified nerves of neck, initial encounter: Secondary | ICD-10-CM

## 2020-03-21 MED ORDER — HYDROCODONE-ACETAMINOPHEN 7.5-325 MG PO TABS: 1.0000 | ORAL_TABLET | Freq: Four times a day (QID) | ORAL | 0 refills | Status: DC | PRN

## 2020-03-21 NOTE — Telephone Encounter (Signed)
RX REFILL HYDROcodone-acetaminophen (NORCO) 7.5-325 MG tablet [id number]  PHARMACY [facility] [id number] [address], [address] AT [place] Phone:  [phone]  Fax:  [phone]

## 2020-04-03 ENCOUNTER — Other Ambulatory Visit: Payer: Self-pay | Admitting: Family Medicine

## 2020-04-03 DIAGNOSIS — S149XXA Injury of unspecified nerves of neck, initial encounter: Secondary | ICD-10-CM

## 2020-04-03 NOTE — Telephone Encounter (Signed)
Pt request refill   HYDROcodone-acetaminophen (NORCO) 7.5-325 MG tablet   [id number] - [place], [place] - [address] Phone:  [phone]  Fax:  [phone]      

## 2020-04-04 MED ORDER — HYDROCODONE-ACETAMINOPHEN 7.5-325 MG PO TABS: 1.0000 | ORAL_TABLET | Freq: Four times a day (QID) | ORAL | 0 refills | Status: DC | PRN

## 2020-04-14 NOTE — Progress Notes (Signed)
Established patient visit   Patient: Jamie Williams   DOB: November 20, 1960   59 y.o. Female  MRN: [id number] Visit Date: 04/15/2020  Today's healthcare provider: Lelon Huh, MD   Chief Complaint  Patient presents with  . Hypertension   Subjective    HPI Hypertension, follow-up  BP Readings from Last 3 Encounters:  04/15/20 (!) 150/88  10/15/19 (!) 144/96  08/06/19 124/78   Wt Readings from Last 3 Encounters:  04/15/20 185 lb (83.9 kg)  10/15/19 183 lb (83 kg)  08/06/19 192 lb (87.1 kg)     She was last seen for hypertension 6 months ago.  BP at that visit was 144/96. Management since that visit includes continuing same medication.  She reports excellent compliance with treatment. She is not having side effects.  She is following a Regular diet. She is not exercising. She does smoke. She also states she has been under a lot of stress lately due to failing health of her dog.  Use of agents associated with hypertension: none.   Outside blood pressures are 140'-150's over 80's . Symptoms: No chest pain No chest pressure  No palpitations No syncope  No dyspnea No orthopnea  No paroxysmal nocturnal dyspnea No lower extremity edema   Pertinent labs: Lab Results  Component Value Date   CHOL 184 10/15/2019   HDL 54 10/15/2019   LDLCALC 112 (H) 10/15/2019   TRIG 99 10/15/2019   CHOLHDL 3.4 10/15/2019   Lab Results  Component Value Date   NA 143 10/15/2019   K 3.7 10/15/2019   CREATININE 0.90 10/15/2019   GFRNONAA 72 10/15/2019   GFRAA 83 10/15/2019   GLUCOSE 97 10/15/2019     The 10-year ASCVD risk score Mikey Bussing DC Jr., et al., 2013) is: 16.7%   --------------------------------------------------------------------------------------------------- She also is here to follow up on chronic radicular pain secondary to cervical disk disease. She states pain is pretty well control, but she does have to take hydrocodone/apap on regular consistent schedule, usually  four times daily.  She also needs refill of albuterol for RAD. She reports she typically has to use it twice a week, sometimes more often if her allergies are bothering her.      Medications: Outpatient Medications Prior to Visit  Medication Sig  . albuterol (PROVENTIL HFA;VENTOLIN HFA) 108 (90 Base) MCG/ACT inhaler Inhale 2 puffs into the lungs every 6 (six) hours as needed for wheezing or shortness of breath.  Marland Kitchen amLODipine (NORVASC) 10 MG tablet TAKE 1 TABLET BY MOUTH EVERY DAY  . buPROPion (WELLBUTRIN SR) 150 MG 12 hr tablet Take 1 tablet (150 mg total) by mouth 2 (two) times daily.  . cholecalciferol (VITAMIN D) 1000 units tablet Take 2,000 Units by mouth daily.   Marland Kitchen estradiol (ESTRACE) 2 MG tablet Take 1 tablet (2 mg total) by mouth daily.  Marland Kitchen gabapentin (NEURONTIN) 300 MG capsule TAKE 1 CAPSULE BY MOUTH THREE TIMES A DAY  . HYDROcodone-acetaminophen (NORCO) 7.5-325 MG tablet Take 1 tablet by mouth every 6 (six) hours as needed for moderate pain.  . meloxicam (MOBIC) 15 MG tablet TAKE 1 TABLET BY MOUTH EVERY DAY WITH FOOD  . PARoxetine (PAXIL) 30 MG tablet TAKE 2 TABLETS (60 MG TOTAL) BY MOUTH DAILY.  . pravastatin (PRAVACHOL) 20 MG tablet TAKE 1 TABLET BY MOUTH EVERY DAY  . promethazine (PHENERGAN) 25 MG tablet TAKE 1 TABLET BY MOUTH EVERY 4 TO 6 HOURS AS NEEDED FOR NAUSEA  . ranitidine (ZANTAC) 150 MG tablet RANITIDINE  HCL, 150MG  (Oral Tablet)  1 Two Times A Day for heartburn, indigestion, nausea for 0 days  Quantity: 60.00;  Refills: 5   Ordered :22-Jul-2010  Reginia Forts MD;  Buddy Duty 28-Jan-2010 Active Comments: DX: 787.02   No facility-administered medications prior to visit.    Review of Systems  Constitutional: Negative for appetite change, chills, fatigue and fever.  Respiratory: Negative for cough, chest tightness, shortness of breath and wheezing.   Cardiovascular: Negative for chest pain, palpitations and leg swelling.  Gastrointestinal: Positive for nausea. Negative  for abdominal pain and vomiting.  Neurological: Positive for headaches (hx of migraines). Negative for dizziness and weakness.       Objective    BP (!) 150/88 (BP Location: Right Arm, Patient Position: Sitting, Cuff Size: Normal)   Pulse 91   Temp (!) 97.1 F (36.2 C) (Skin)   Wt 185 lb (83.9 kg)   SpO2 98%   BMI 30.79 kg/m    Physical Exam  General appearance: Obese female, cooperative and in no acute distress Head: Normocephalic, without obvious abnormality, atraumatic Respiratory: Respirations even and unlabored, normal respiratory rate Extremities: All extremities are intact.  Skin: Skin color, texture, turgor normal. No rashes seen  Psych: Appropriate mood and affect. Neurologic: Mental status: Alert, oriented to person, place, and time, thought content appropriate.     Assessment & Plan     1. Benign essential HTN Uncontrolled, add - hydrochlorothiazide (HYDRODIURIL) 12.5 MG tablet; Take 1 tablet (12.5 mg total) by mouth daily.  Dispense: 90 tablet; Refill: 3  2. Compression injury of cervical spinal nerve Pain adequately controlled, refill- HYDROcodone-acetaminophen (NORCO) 7.5-325 MG tablet; Take 1 tablet by mouth every 6 (six) hours as needed for moderate pain.  Dispense: 30 tablet; Refill: 0  3. Mild intermittent reactive airway disease without complication refill- albuterol (VENTOLIN HFA) 108 (90 Base) MCG/ACT inhaler; Inhale 2 puffs into the lungs every 6 (six) hours as needed for wheezing or shortness of breath.  Dispense: 18 g; Refill: 3  4. Cervical spondylosis with radiculopathy   5. Chronic, continuous use of opioids No sign of abuse or diversion.   6. Aortic atherosclerosis (HCC) Continue statin, working on optimizing BP control.  Follow up 3 mos for BP check.    No follow-ups on file.         Lelon Huh, MD  [facility] [phone] (phone) [phone] (fax)  [place]

## 2020-04-15 ENCOUNTER — Other Ambulatory Visit: Payer: Self-pay

## 2020-04-15 ENCOUNTER — Ambulatory Visit (INDEPENDENT_AMBULATORY_CARE_PROVIDER_SITE_OTHER): Payer: Medicare Other | Admitting: Family Medicine

## 2020-04-15 VITALS — BP 150/88 | HR 91 | Temp 97.1°F | Wt 185.0 lb

## 2020-04-15 DIAGNOSIS — M4722 Other spondylosis with radiculopathy, cervical region: Secondary | ICD-10-CM

## 2020-04-15 DIAGNOSIS — I700 Atherosclerosis of aorta: Secondary | ICD-10-CM

## 2020-04-15 DIAGNOSIS — J4520 Mild intermittent asthma, uncomplicated: Secondary | ICD-10-CM | POA: Diagnosis not present

## 2020-04-15 DIAGNOSIS — I10 Essential (primary) hypertension: Secondary | ICD-10-CM | POA: Diagnosis not present

## 2020-04-15 DIAGNOSIS — S149XXA Injury of unspecified nerves of neck, initial encounter: Secondary | ICD-10-CM | POA: Diagnosis not present

## 2020-04-15 DIAGNOSIS — F1190 Opioid use, unspecified, uncomplicated: Secondary | ICD-10-CM

## 2020-04-15 MED ORDER — HYDROCHLOROTHIAZIDE 12.5 MG PO TABS: 12.5000 mg | ORAL_TABLET | Freq: Every day | ORAL | 3 refills | Status: DC

## 2020-04-15 MED ORDER — HYDROCODONE-ACETAMINOPHEN 7.5-325 MG PO TABS: 1.0000 | ORAL_TABLET | Freq: Four times a day (QID) | ORAL | 0 refills | Status: DC | PRN

## 2020-04-15 MED ORDER — ALBUTEROL SULFATE HFA 108 (90 BASE) MCG/ACT IN AERS: 2.0000 | INHALATION_SPRAY | Freq: Four times a day (QID) | RESPIRATORY_TRACT | 3 refills | Status: DC | PRN

## 2020-04-15 NOTE — Patient Instructions (Signed)
.   Please review the attached list of medications and notify my office if there are any errors.   . Please bring all of your medications to every appointment so we can make sure that our medication list is the same as yours.   

## 2020-05-02 ENCOUNTER — Other Ambulatory Visit: Payer: Self-pay | Admitting: Family Medicine

## 2020-05-02 DIAGNOSIS — S149XXA Injury of unspecified nerves of neck, initial encounter: Secondary | ICD-10-CM

## 2020-05-02 NOTE — Telephone Encounter (Signed)
Medication Refill - Medication: HYDROcodone-acetaminophen (NORCO) 7.5-325 MG tablet    Has the patient contacted their pharmacy? Yes.   (Agent: If no, request that the patient contact the pharmacy for the refill.) (Agent: If yes, when and what did the pharmacy advise?)  Preferred Pharmacy (with phone number or street name):  [facility][id number] [address], [place] [address]  [place] [address]  Phone: [phone] Fax: [phone]     Agent: Please be advised that RX refills may take up to 3 business days. We ask that you follow-up with your pharmacy.

## 2020-05-02 NOTE — Telephone Encounter (Signed)
Requested medication (s) are due for refill today: no  Requested medication (s) are on the active medication list: yes  Last refill:  04/15/2020  Future visit scheduled:  yes  Notes to clinic:  this refill cannot be delegated    Requested Prescriptions  Pending Prescriptions Disp Refills   HYDROcodone-acetaminophen (NORCO) 7.5-325 MG tablet 30 tablet 0    Sig: Take 1 tablet by mouth every 6 (six) hours as needed for moderate pain.      Not Delegated - Analgesics:  Opioid Agonist Combinations Failed - 05/02/2020 12:42 PM      Failed - This refill cannot be delegated      Failed - Urine Drug Screen completed in last 360 days.      Passed - Valid encounter within last 6 months    Recent Outpatient Visits           2 weeks ago Benign essential HTN   [facility] [name], MD   6 months ago Benign essential HTN   [facility] [name], MD   9 months ago Benign essential HTN   [facility] [name], MD   1 year ago Menopausal symptoms   [facility] [name], MD   1 year ago Pre-diabetes   [facility] [name], MD       Future Appointments             In 2 months Fisher, Kirstie Peri, MD [facility], [place]

## 2020-05-05 MED ORDER — HYDROCODONE-ACETAMINOPHEN 7.5-325 MG PO TABS: 1.0000 | ORAL_TABLET | Freq: Four times a day (QID) | ORAL | 0 refills | Status: DC | PRN

## 2020-05-16 ENCOUNTER — Other Ambulatory Visit: Payer: Self-pay | Admitting: Family Medicine

## 2020-05-16 DIAGNOSIS — S149XXA Injury of unspecified nerves of neck, initial encounter: Secondary | ICD-10-CM

## 2020-05-16 NOTE — Telephone Encounter (Signed)
Copied from [place] [id number]. Topic: Quick Communication - Rx Refill/Question >> May 16, 2020  9:16 AM Yvette Rack wrote: Medication: HYDROcodone-acetaminophen (NORCO) 7.5-325 MG tablet  Has the patient contacted their pharmacy? no  Preferred Pharmacy (with phone number or street name): [id number] [address], [place]  Phone: [phone]   Fax: [phone]  Agent: Please be advised that RX refills may take up to 3 business days. We ask that you follow-up with your pharmacy.

## 2020-05-16 NOTE — Telephone Encounter (Signed)
Requested medication (s) are due for refill today: yes  Requested medication (s) are on the active medication list: yes  Last refill: 05/05/2020  Future visit scheduled: yes  Notes to clinic:  this refill cannot be delegated    Requested Prescriptions  Pending Prescriptions Disp Refills   HYDROcodone-acetaminophen (NORCO) 7.5-325 MG tablet 30 tablet 0    Sig: Take 1 tablet by mouth every 6 (six) hours as needed for moderate pain.      Not Delegated - Analgesics:  Opioid Agonist Combinations Failed - 05/16/2020  9:19 AM      Failed - This refill cannot be delegated      Failed - Urine Drug Screen completed in last 360 days.      Passed - Valid encounter within last 6 months    Recent Outpatient Visits           1 month ago Benign essential HTN   [facility] [name], MD   7 months ago Benign essential HTN   [facility] [name], MD   10 months ago Benign essential HTN   [facility] [name], MD   1 year ago Menopausal symptoms   [facility] [name], MD   1 year ago Pre-diabetes   [facility] [name], MD       Future Appointments             In 2 months Fisher, Kirstie Peri, MD [facility], [place]

## 2020-05-18 MED ORDER — HYDROCODONE-ACETAMINOPHEN 7.5-325 MG PO TABS: 1.0000 | ORAL_TABLET | Freq: Four times a day (QID) | ORAL | 0 refills | Status: DC | PRN

## 2020-05-25 ENCOUNTER — Other Ambulatory Visit: Payer: Self-pay | Admitting: Family Medicine

## 2020-06-06 ENCOUNTER — Other Ambulatory Visit: Payer: Self-pay | Admitting: Family Medicine

## 2020-06-06 DIAGNOSIS — S149XXA Injury of unspecified nerves of neck, initial encounter: Secondary | ICD-10-CM

## 2020-06-06 MED ORDER — HYDROCODONE-ACETAMINOPHEN 7.5-325 MG PO TABS: 1.0000 | ORAL_TABLET | Freq: Four times a day (QID) | ORAL | 0 refills | Status: DC | PRN

## 2020-06-06 NOTE — Telephone Encounter (Signed)
LOV 04/15/20 LRF 05/18/20 #30

## 2020-06-06 NOTE — Telephone Encounter (Signed)
RX REFILL HYDROcodone-acetaminophen (NORCO) 7.5-325 MG tablet [[id number]]  PHARMACY [id number], [address] AT [place] Phone:  [phone]  Fax:  [phone]

## 2020-06-06 NOTE — Telephone Encounter (Signed)
Requested medication (s) are due for refill today: yes  Requested medication (s) are on the active medication list: yes  Last refill:  05/18/20  Future visit scheduled: yes  Notes to clinic:  Please review for refill. Refill not delegated per protocol.    Requested Prescriptions  Pending Prescriptions Disp Refills   HYDROcodone-acetaminophen (NORCO) 7.5-325 MG tablet 30 tablet 0    Sig: Take 1 tablet by mouth every 6 (six) hours as needed for moderate pain.      Not Delegated - Analgesics:  Opioid Agonist Combinations Failed - 06/06/2020 10:34 AM      Failed - This refill cannot be delegated      Failed - Urine Drug Screen completed in last 360 days.      Passed - Valid encounter within last 6 months    Recent Outpatient Visits           1 month ago Benign essential HTN   [facility] [name], MD   7 months ago Benign essential HTN   [facility] [name], MD   10 months ago Benign essential HTN   [facility] [name], MD   1 year ago Menopausal symptoms   [facility] [name], MD   1 year ago Pre-diabetes   [place], Kirstie Peri, MD       Future Appointments             In 1 month Fisher, Kirstie Peri, MD [facility], [place]

## 2020-06-27 ENCOUNTER — Telehealth: Payer: Self-pay | Admitting: Family Medicine

## 2020-07-03 NOTE — Progress Notes (Signed)
Subjective:   Jamie Williams is a 59 y.o. female who presents for Medicare Annual (Subsequent) preventive examination.  I connected with [place] Dom today by telephone and verified that I am speaking with the correct person using two identifiers. Location patient: home Location provider: work Persons participating in the virtual visit: patient, provider.   I discussed the limitations, risks, security and privacy concerns of performing an evaluation and management service by telephone and the availability of in person appointments. I also discussed with the patient that there may be a patient responsible charge related to this service. The patient expressed understanding and verbally consented to this telephonic visit.    Interactive audio and video telecommunications were attempted between this provider and patient, however failed, due to patient having technical difficulties OR patient did not have access to video capability.  We continued and completed visit with audio only.   Review of Systems    N/A  Cardiac Risk Factors include: dyslipidemia;hypertension;smoking/ tobacco exposure     Objective:    There were no vitals filed for this visit. There is no height or weight on file to calculate BMI.  Advanced Directives 07/07/2020 07/03/2019 06/27/2018 02/22/2018 09/30/2016 06/06/2016 12/16/2015  Does Patient Have a Medical Advance Directive? No No No No No No No  Does patient want to make changes to medical advance directive? - - Yes (MAU/Ambulatory/Procedural Areas - Information given) - - - -  Would patient like information on creating a medical advance directive? - No - Patient declined - No - Patient declined Yes (MAU/Ambulatory/Procedural Areas - Information given) No - patient declined information Yes - Educational materials given    Current Medications (verified) Outpatient Encounter Medications as of 07/07/2020  Medication Sig  . albuterol (VENTOLIN HFA) 108 (90 Base) MCG/ACT  inhaler Inhale 2 puffs into the lungs every 6 (six) hours as needed for wheezing or shortness of breath.  Marland Kitchen amLODipine (NORVASC) 10 MG tablet TAKE 1 TABLET BY MOUTH EVERY DAY  . buPROPion (WELLBUTRIN SR) 150 MG 12 hr tablet TAKE 1 TABLET BY MOUTH TWICE DAILY  . cholecalciferol (VITAMIN D) 1000 units tablet Take 2,000 Units by mouth daily.   Marland Kitchen estradiol (ESTRACE) 2 MG tablet Take 1 tablet (2 mg total) by mouth daily.  Marland Kitchen gabapentin (NEURONTIN) 300 MG capsule TAKE 1 CAPSULE BY MOUTH THREE TIMES A DAY  . hydrochlorothiazide (HYDRODIURIL) 12.5 MG tablet Take 1 tablet (12.5 mg total) by mouth daily.  Marland Kitchen HYDROcodone-acetaminophen (NORCO) 7.5-325 MG tablet Take 1 tablet by mouth every 6 (six) hours as needed for moderate pain.  . meloxicam (MOBIC) 15 MG tablet TAKE 1 TABLET BY MOUTH EVERY DAY WITH FOOD  . PARoxetine (PAXIL) 30 MG tablet TAKE 2 TABLETS (60 MG TOTAL) BY MOUTH DAILY.  . pravastatin (PRAVACHOL) 20 MG tablet TAKE 1 TABLET BY MOUTH EVERY DAY  . promethazine (PHENERGAN) 25 MG tablet TAKE 1 TABLET BY MOUTH EVERY 4 TO 6 HOURS AS NEEDED FOR NAUSEA  . ranitidine (ZANTAC) 150 MG tablet RANITIDINE HCL, 150MG  (Oral Tablet)  1 Two Times A Day for heartburn, indigestion, nausea for 0 days  Quantity: 60.00;  Refills: 5   Ordered :22-Jul-2010  Reginia Forts MD;  Buddy Duty 28-Jan-2010 Active Comments: DX: 787.02   No facility-administered encounter medications on file as of 07/07/2020.    Allergies (verified) Aspirin, Penicillins, and Sulfa antibiotics   History: Past Medical History:  Diagnosis Date  . Anxiety   . Family history of adverse reaction to anesthesia    "  sister had a difficult time waking up from mastectomy and passed away"   . GERD (gastroesophageal reflux disease)   . History of chicken pox   . History of kidney stones   . PONV (postoperative nausea and vomiting)    nausea  . Sciatic nerve pain    Past Surgical History:  Procedure Laterality Date  . ANTERIOR CERVICAL  DECOMP/DISCECTOMY FUSION N/A 12/24/2015   Procedure: Cervical five-six, Cervical six-seven  Anterior cervical decompression/diskectomy/fusion/interbody prosthesis/plate;  Surgeon: Newman Pies, MD;  Location: [place] [facility];  Service: Neurosurgery;  Laterality: N/A;  . COLONOSCOPY WITH PROPOFOL N/A 07/17/2018   Procedure: COLONOSCOPY WITH PROPOFOL;  Surgeon: Jonathon Bellows, MD;  Location: [facility] END[facility]COPY;  Service: Gastroenterology;  Laterality: N/A;  . [place] and 1996   two Etopic preg.  Marland Kitchen Head Injuries  2006   surgery 2006 Cram/NS in [place]. Headaches with increased intracranial pressure. Repeat MRI in 2008 Negative  . SUPRACERVICAL ABDOMINAL HYSTERECTOMY  2011   Abdominal; Ovaries intact; CERVIX INTACT. Fibroids/dys menorrhea. [place]  . Ulnar Neuropathy Right 2004   Ulnar Neuropathy surgical revision. 2004-2008. [place].   Family History  Problem Relation Age of Onset  . Hypertension Mother   . Diabetes Mother        Type 2  . Breast cancer Mother 34  . Cancer Sister 44       Breast  . Diabetes Sister        type 2  . Breast cancer Sister 41  . Diabetes Sister   . Diabetes Sister   . Diabetes Sister        type 2  . Liver cancer Brother   . Stomach cancer Brother    Social History   Socioeconomic History  . Marital status: Married    Spouse name: Not on file  . Number of children: 1  . Years of education: Not on file  . Highest education level: High school graduate  Occupational History  . Occupation: disabled  Tobacco Use  . Smoking status: Current Every Day Smoker    Packs/day: 0.50  . Smokeless tobacco: Never Used  . Tobacco comment: Started about 59 years old usually 1/2 ppd  Vaping Use  . Vaping Use: Never used  Substance and Sexual Activity  . Alcohol use: Not Currently    Alcohol/week: 0.0 standard drinks  . Drug use: Not Currently  . Sexual activity: Not on file  Other Topics Concern  . Not on file  Social History  Narrative  . Not on file   Social Determinants of Health   Financial Resource Strain: Low Risk   . Difficulty of Paying Living Expenses: Not hard at all  Food Insecurity: No Food Insecurity  . Worried About Charity fundraiser in the Last Year: Never true  . Ran Out of Food in the Last Year: Never true  Transportation Needs: No Transportation Needs  . Lack of Transportation (Medical): No  . Lack of Transportation (Non-Medical): No  Physical Activity: Inactive  . Days of Exercise per Week: 0 days  . Minutes of Exercise per Session: 0 min  Stress: No Stress Concern Present  . Feeling of Stress : Not at all  Social Connections: Moderately Integrated  . Frequency of Communication with Friends and Family: More than three times a week  . Frequency of Social Gatherings with Friends and Family: Three times a week  . Attends Religious Services: More than 4 times per year  . Active Member  of Clubs or Organizations: No  . Attends Archivist Meetings: Never  . Marital Status: Married    Tobacco Counseling Ready to quit: No Counseling given: No Comment: Started about 59 years old usually 1/2 ppd   Clinical Intake:  Pre-visit preparation completed: Yes  Pain : No/denies pain     Nutritional Risks: None Diabetes: No  How often do you need to have someone help you when you read instructions, pamphlets, or other written materials from your doctor or pharmacy?: 1 - Never  Diabetic? No  Interpreter Needed?: No  Information entered by :: MMarkoski, LPN   Activities of Daily Living In your present state of health, do you have any difficulty performing the following activities: 07/07/2020 10/15/2019  Hearing? N N  Vision? N Y  Difficulty concentrating or making decisions? N N  Walking or climbing stairs? Y Y  Comment Due to knee pains. -  Dressing or bathing? N N  Doing errands, shopping? N N  Preparing Food and eating ? N -  Using the Toilet? N -  In the past six  months, have you accidently leaked urine? Y -  Comment Occasionally with pressure. -  Do you have problems with loss of bowel control? N -  Managing your Medications? N -  Managing your Finances? N -  Housekeeping or managing your Housekeeping? N -  Some recent data might be hidden    Patient Care Team: [name], MD as PCP - General (Family Medicine)  Indicate any recent Medical Services you may have received from other than [facility] providers in the past year (date may be approximate).     Assessment:   This is a routine wellness examination for Dailin.  Hearing/Vision screen No exam data present  Dietary issues and exercise activities discussed: Current Exercise Habits: The patient does not participate in regular exercise at present, Exercise limited by: orthopedic condition(s)  Goals    . Exercise 3x per week (30 min per time)     Recommend to start walking 3 days a week for at least 30 minutes at a time.     . Quit Smoking     Recommend to continue efforts to reduce smoking habits until no longer smoking.       Depression Screen PHQ 2/9 Scores 07/07/2020 07/07/2020 10/15/2019 07/03/2019 06/27/2018 11/14/2017 09/30/2016  PHQ - 2 Score 1 0 0 1 2 2  0  PHQ- 9 Score - - 4 - 5 4 -    Fall Risk Fall Risk  07/07/2020 10/15/2019 07/03/2019 06/27/2018 09/30/2016  Falls in the past year? 0 0 0 No No  Number falls in past yr: 0 0 - - -  Injury with Fall? 0 0 - - -    Any stairs in or around the home? Yes  If so, are there any without handrails? No  Home free of loose throw rugs in walkways, pet beds, electrical cords, etc? Yes  Adequate lighting in your home to reduce risk of falls? Yes   ASSISTIVE DEVICES UTILIZED TO PREVENT FALLS:  Life alert? No  Use of a cane, walker or w/c? No  Grab bars in the bathroom? No  Shower chair or bench in shower? Yes  Elevated toilet seat or a handicapped toilet? Yes    Cognitive Function:     6CIT Screen 07/07/2020 07/03/2019 06/27/2018  09/30/2016  What Year? 0 points 0 points 0 points 0 points  What month? 0 points 0 points 0 points 0 points  What time? 0 points 0 points 0 points 0 points  Count back from 20 0 points 0 points 0 points 0 points  Months in reverse 0 points 0 points 2 points 2 points  Repeat phrase 0 points 2 points 0 points 4 points  Total Score 0 2 2 6     Immunizations Immunization History  Administered Date(s) Administered  . Influenza,inj,Quad PF,6+ Mos 08/29/2015, 08/26/2016, 11/14/2017, 07/11/2018, 07/13/2019  . PFIZER SARS-COV-2 Vaccination 02/14/2020, 03/11/2020  . Pneumococcal Polysaccharide-23 02/24/2011  . Tdap 01/28/2010    TDAP status: Due, Education has been provided regarding the importance of this vaccine. Advised may receive this vaccine at local pharmacy or Health Dept. Aware to provide a copy of the vaccination record if obtained from local pharmacy or Health Dept. Verbalized acceptance and understanding. Flu Vaccine status: Due fall 2021 Covid-19 vaccine status: Completed vaccines  Qualifies for Shingles Vaccine? Yes   Zostavax completed No   Shingrix Completed?: No.    Education has been provided regarding the importance of this vaccine. Patient has been advised to call insurance company to determine out of pocket expense if they have not yet received this vaccine. Advised may also receive vaccine at local pharmacy or Health Dept. Verbalized acceptance and understanding.  Screening Tests Health Maintenance  Topic Date Due  . INFLUENZA VACCINE  05/25/2020  . PAP SMEAR-Modifier  07/12/2020  . TETANUS/TDAP  07/07/2021 (Originally 01/29/2020)  . MAMMOGRAM  09/03/2021  . COLONOSCOPY  07/18/2023  . COVID-19 Vaccine  Completed  . Hepatitis C Screening  Completed  . HIV Screening  Completed    Health Maintenance  Health Maintenance Due  Topic Date Due  . INFLUENZA VACCINE  05/25/2020  . PAP SMEAR-Modifier  07/12/2020    Colorectal cancer screening: Completed 07/17/18. Repeat  every 5 years Mammogram status: Completed 09/04/19. Repeat every year  Lung Cancer Screening: (Low Dose CT Chest recommended if Age 32-80 years, 30 pack-year currently smoking OR have quit w/in 15years.) does qualify.   Lung Cancer Screening Referral: An Epic message has been sent to Burgess Estelle, RN (Oncology Nurse Navigator) regarding the possible need for this exam. Raquel Sarna will review the patient's chart to determine if the patient truly qualifies for the exam. If the patient qualifies, Raquel Sarna will order the Low Dose CT of the chest to facilitate the scheduling of this exam.  Additional Screening:  Hepatitis C Screening: Up to date  Vision Screening: Recommended annual ophthalmology exams for early detection of glaucoma and other disorders of the eye. Is the patient up to date with their annual eye exam? No Who is the provider or what is the name of the office in which the patient attends annual eye exams? Pt to check for insurance coverage with either Dr Gloriann Loan or Dr Ellin Mayhew.  If pt is not established with a provider, would they like to be referred to a provider to establish care? No .   Dental Screening: Recommended annual dental exams for proper oral hygiene  Community Resource Referral / Chronic Care Management: CRR required this visit?  No   CCM required this visit?  No      Plan:     I have personally reviewed and noted the following in the patient's chart:   . Medical and social history . Use of alcohol, tobacco or illicit drugs  . Current medications and supplements . Functional ability and status . Nutritional status . Physical activity . Advanced directives . List of other physicians . Hospitalizations, surgeries, and ER  visits in previous 12 months . Vitals . Screenings to include cognitive, depression, and falls . Referrals and appointments  In addition, I have reviewed and discussed with patient certain preventive protocols, quality metrics, and best practice  recommendations. A written personalized care plan for preventive services as well as general preventive health recommendations were provided to patient.     Briley Sulton [place], [place]   [id number]/[id number]   Nurse Notes: Pt to receive the flu shot at next in office apt.

## 2020-07-07 ENCOUNTER — Other Ambulatory Visit: Payer: Self-pay

## 2020-07-07 ENCOUNTER — Ambulatory Visit (INDEPENDENT_AMBULATORY_CARE_PROVIDER_SITE_OTHER): Payer: BC Managed Care – PPO

## 2020-07-07 ENCOUNTER — Telehealth: Payer: Self-pay

## 2020-07-07 DIAGNOSIS — Z0000 Encounter for general adult medical examination without abnormal findings: Secondary | ICD-10-CM | POA: Diagnosis not present

## 2020-07-07 NOTE — Telephone Encounter (Signed)
Contacted patient for lung CT screening clinic after receiving referral from Dr. Caryn Section.  Message left for patient to call Burgess Estelle, lung navigator to schedule CT scan.

## 2020-07-07 NOTE — Patient Instructions (Signed)
Jamie Williams , Thank you for taking time to come for your Medicare Wellness Visit. I appreciate your ongoing commitment to your health goals. Please review the following plan we discussed and let me know if I can assist you in the future.   Screening recommendations/referrals: Colonoscopy: Up to date, due 06/2023 Mammogram: Up to date, due 08/2020 Recommended yearly ophthalmology/optometry visit for glaucoma screening and checkup Recommended yearly dental visit for hygiene and checkup  Vaccinations: Influenza vaccine: Currently due Tdap vaccine: Currently due, declined at this time. Shingles vaccine: Shingrix discussed. Please contact your pharmacy for coverage information.     Advanced directives: Advance directive discussed with you today. Even though you declined this today please call our office should you change your mind and we can give you the proper paperwork for you to fill out.  Conditions/risks identified: Smoking cessation discussed today. Recommend to start walking 3 days a week for at least 30 minutes at a time.   Next appointment: 07/21/20 @ 8:40 AM with Dr Caryn Section   Preventive Care 40-64 Years, Female Preventive care refers to lifestyle choices and visits with your health care provider that can promote health and wellness. What does preventive care include?  A yearly physical exam. This is also called an annual well check.  Dental exams once or twice a year.  Routine eye exams. Ask your health care provider how often you should have your eyes checked.  Personal lifestyle choices, including:  Daily care of your teeth and gums.  Regular physical activity.  Eating a healthy diet.  Avoiding tobacco and drug use.  Limiting alcohol use.  Practicing safe sex.  Taking low-dose aspirin daily starting at age 90.  Taking vitamin and mineral supplements as recommended by your health care provider. What happens during an annual well check? The services and screenings  done by your health care provider during your annual well check will depend on your age, overall health, lifestyle risk factors, and family history of disease. Counseling  Your health care provider may ask you questions about your:  Alcohol use.  Tobacco use.  Drug use.  Emotional well-being.  Home and relationship well-being.  Sexual activity.  Eating habits.  Work and work Statistician.  Method of birth control.  Menstrual cycle.  Pregnancy history. Screening  You may have the following tests or measurements:  Height, weight, and BMI.  Blood pressure.  Lipid and cholesterol levels. These may be checked every 5 years, or more frequently if you are over 10 years old.  Skin check.  Lung cancer screening. You may have this screening every year starting at age 83 if you have a 30-pack-year history of smoking and currently smoke or have quit within the past 15 years.  Fecal occult blood test (FOBT) of the stool. You may have this test every year starting at age 38.  Flexible sigmoidoscopy or colonoscopy. You may have a sigmoidoscopy every 5 years or a colonoscopy every 10 years starting at age 2.  Hepatitis C blood test.  Hepatitis B blood test.  Sexually transmitted disease (STD) testing.  Diabetes screening. This is done by checking your blood sugar (glucose) after you have not eaten for a while (fasting). You may have this done every 1-3 years.  Mammogram. This may be done every 1-2 years. Talk to your health care provider about when you should start having regular mammograms. This may depend on whether you have a family history of breast cancer.  BRCA-related cancer screening. This may be done  if you have a family history of breast, ovarian, tubal, or peritoneal cancers.  Pelvic exam and Pap test. This may be done every 3 years starting at age 58. Starting at age 32, this may be done every 5 years if you have a Pap test in combination with an HPV test.  Bone  density scan. This is done to screen for osteoporosis. You may have this scan if you are at high risk for osteoporosis. Discuss your test results, treatment options, and if necessary, the need for more tests with your health care provider. Vaccines  Your health care provider may recommend certain vaccines, such as:  Influenza vaccine. This is recommended every year.  Tetanus, diphtheria, and acellular pertussis (Tdap, Td) vaccine. You may need a Td booster every 10 years.  Zoster vaccine. You may need this after age 51.  Pneumococcal 13-valent conjugate (PCV13) vaccine. You may need this if you have certain conditions and were not previously vaccinated.  Pneumococcal polysaccharide (PPSV23) vaccine. You may need one or two doses if you smoke cigarettes or if you have certain conditions. Talk to your health care provider about which screenings and vaccines you need and how often you need them. This information is not intended to replace advice given to you by your health care provider. Make sure you discuss any questions you have with your health care provider. Document Released: 11/07/2015 Document Revised: 06/30/2016 Document Reviewed: 08/12/2015 Elsevier Interactive Patient Education  2017 [employer] Prevention in the Home Falls can cause injuries. They can happen to people of all ages. There are many things you can do to make your home safe and to help prevent falls. What can I do on the outside of my home?  Regularly fix the edges of walkways and driveways and fix any cracks.  Remove anything that might make you trip as you walk through a door, such as a raised step or threshold.  Trim any bushes or trees on the path to your home.  Use bright outdoor lighting.  Clear any walking paths of anything that might make someone trip, such as rocks or tools.  Regularly check to see if handrails are loose or broken. Make sure that both sides of any steps have  handrails.  Any raised decks and porches should have guardrails on the edges.  Have any leaves, snow, or ice cleared regularly.  Use sand or salt on walking paths during winter.  Clean up any spills in your garage right away. This includes oil or grease spills. What can I do in the bathroom?  Use night lights.  Install grab bars by the toilet and in the tub and shower. Do not use towel bars as grab bars.  Use non-skid mats or decals in the tub or shower.  If you need to sit down in the shower, use a plastic, non-slip stool.  Keep the floor dry. Clean up any water that spills on the floor as soon as it happens.  Remove soap buildup in the tub or shower regularly.  Attach bath mats securely with double-sided non-slip rug tape.  Do not have throw rugs and other things on the floor that can make you trip. What can I do in the bedroom?  Use night lights.  Make sure that you have a light by your bed that is easy to reach.  Do not use any sheets or blankets that are too big for your bed. They should not hang down onto the floor.  Have a firm chair that has side arms. You can use this for support while you get dressed.  Do not have throw rugs and other things on the floor that can make you trip. What can I do in the kitchen?  Clean up any spills right away.  Avoid walking on wet floors.  Keep items that you use a lot in easy-to-reach places.  If you need to reach something above you, use a strong step stool that has a grab bar.  Keep electrical cords out of the way.  Do not use floor polish or wax that makes floors slippery. If you must use wax, use non-skid floor wax.  Do not have throw rugs and other things on the floor that can make you trip. What can I do with my stairs?  Do not leave any items on the stairs.  Make sure that there are handrails on both sides of the stairs and use them. Fix handrails that are broken or loose. Make sure that handrails are as long as  the stairways.  Check any carpeting to make sure that it is firmly attached to the stairs. Fix any carpet that is loose or worn.  Avoid having throw rugs at the top or bottom of the stairs. If you do have throw rugs, attach them to the floor with carpet tape.  Make sure that you have a light switch at the top of the stairs and the bottom of the stairs. If you do not have them, ask someone to add them for you. What else can I do to help prevent falls?  Wear shoes that:  Do not have high heels.  Have rubber bottoms.  Are comfortable and fit you well.  Are closed at the toe. Do not wear sandals.  If you use a stepladder:  Make sure that it is fully opened. Do not climb a closed stepladder.  Make sure that both sides of the stepladder are locked into place.  Ask someone to hold it for you, if possible.  Clearly mark and make sure that you can see:  Any grab bars or handrails.  First and last steps.  Where the edge of each step is.  Use tools that help you move around (mobility aids) if they are needed. These include:  Canes.  Walkers.  Scooters.  Crutches.  Turn on the lights when you go into a dark area. Replace any light bulbs as soon as they burn out.  Set up your furniture so you have a clear path. Avoid moving your furniture around.  If any of your floors are uneven, fix them.  If there are any pets around you, be aware of where they are.  Review your medicines with your doctor. Some medicines can make you feel dizzy. This can increase your chance of falling. Ask your doctor what other things that you can do to help prevent falls. This information is not intended to replace advice given to you by your health care provider. Make sure you discuss any questions you have with your health care provider. Document Released: 08/07/2009 Document Revised: 03/18/2016 Document Reviewed: 11/15/2014 Elsevier Interactive Patient Education  2017 [employer].

## 2020-07-08 ENCOUNTER — Other Ambulatory Visit: Payer: Self-pay | Admitting: Family Medicine

## 2020-07-08 DIAGNOSIS — S149XXA Injury of unspecified nerves of neck, initial encounter: Secondary | ICD-10-CM

## 2020-07-08 NOTE — Telephone Encounter (Signed)
HYDROcodone-acetaminophen (NORCO) 7.5-325 MG tablet Medication Date: 06/06/2020 Department: [place] Ordering/Authorizing: Virginia Crews, MD   [place] [phone] [address] [address], [place] - [place] Phone:  [phone]  Fax:  [phone]

## 2020-07-08 NOTE — Telephone Encounter (Signed)
Requested medication (s) are due for refill today: yes  Requested medication (s) are on the active medication list: yes  Last refill:  06/06/20  #30  0 refills  Future visit scheduled:yes  Notes to clinic:  not delegated    Requested Prescriptions  Pending Prescriptions Disp Refills   HYDROcodone-acetaminophen (NORCO) 7.5-325 MG tablet 30 tablet 0    Sig: Take 1 tablet by mouth every 6 (six) hours as needed for moderate pain.      Not Delegated - Analgesics:  Opioid Agonist Combinations Failed - 07/08/2020 11:04 AM      Failed - This refill cannot be delegated      Failed - Urine Drug Screen completed in last 360 days.      Passed - Valid encounter within last 6 months    Recent Outpatient Visits           2 months ago Benign essential HTN   [facility] [name], MD   8 months ago Benign essential HTN   [facility] [name], MD   12 months ago Benign essential HTN   [facility] [name], MD   1 year ago Menopausal symptoms   [facility] [name], MD   1 year ago Pre-diabetes   [place], Kirstie Peri, MD       Future Appointments             In 1 week Fisher, Kirstie Peri, MD [facility] [place]

## 2020-07-09 MED ORDER — HYDROCODONE-ACETAMINOPHEN 7.5-325 MG PO TABS: 1.0000 | ORAL_TABLET | Freq: Four times a day (QID) | ORAL | 0 refills | Status: DC | PRN

## 2020-07-11 ENCOUNTER — Emergency Department
Admission: EM | Admit: 2020-07-11 | Discharge: 2020-07-11 | Disposition: A | Discharge status: Home / Self Care | Payer: BC Managed Care – PPO | Attending: Emergency Medicine | Admitting: Emergency Medicine

## 2020-07-11 ENCOUNTER — Other Ambulatory Visit: Payer: Self-pay

## 2020-07-11 ENCOUNTER — Encounter: Payer: Self-pay | Admitting: Emergency Medicine

## 2020-07-11 ENCOUNTER — Ambulatory Visit: Payer: Self-pay | Admitting: *Deleted

## 2020-07-11 DIAGNOSIS — F17200 Nicotine dependence, unspecified, uncomplicated: Secondary | ICD-10-CM | POA: Insufficient documentation

## 2020-07-11 DIAGNOSIS — Z79899 Other long term (current) drug therapy: Secondary | ICD-10-CM | POA: Insufficient documentation

## 2020-07-11 DIAGNOSIS — R519 Headache, unspecified: Secondary | ICD-10-CM | POA: Insufficient documentation

## 2020-07-11 DIAGNOSIS — J449 Chronic obstructive pulmonary disease, unspecified: Secondary | ICD-10-CM | POA: Diagnosis not present

## 2020-07-11 DIAGNOSIS — I10 Essential (primary) hypertension: Secondary | ICD-10-CM

## 2020-07-11 NOTE — ED Provider Notes (Signed)
[facility] Emergency Department Provider Note  ____________________________________________  Time seen: Approximately 6:10 PM  I have reviewed the triage vital signs and the nursing notes.   HISTORY  Chief Complaint Hypertension   HPI Jamie Williams is a 59 y.o. female with a history of hypertension who does daily checks of her blood pressure. Over the past 2 weeks she has noticed that it has been higher than usual despite taking her medication as prescribed. She has had headaches not relieved by tylenol. She has significant recent stressors in her life.   Past Medical History:  Diagnosis Date  . Anxiety   . Family history of adverse reaction to anesthesia    "sister had a difficult time waking up from mastectomy and passed away"   . GERD (gastroesophageal reflux disease)   . History of chicken pox   . History of kidney stones   . PONV (postoperative nausea and vomiting)    nausea  . Sciatic nerve pain     Patient Active Problem List   Diagnosis Date Noted  . Chronic, continuous use of opioids 04/15/2020  . Pap smear abnormality of cervix/human papillomavirus (HPV) positive 08/06/2019  . Aortic atherosclerosis ([place]) 10/23/2018  . GERD (gastroesophageal reflux disease)   . Menopausal symptoms 01/10/2017  . Cervical spondylosis with radiculopathy 12/24/2015  . Compression injury of cervical spinal nerve 10/07/2015  . Left arm pain 08/29/2015  . Neck pain 08/29/2015  . Hematuria 07/03/2015  . Allergic arthritis 06/27/2015  . Arthritis 06/27/2015  . Back ache 06/27/2015  . Clinical depression 06/27/2015  . H/O abnormal cervical Papanicolaou smear 06/27/2015  . Elevated intracranial pressure 06/27/2015  . Kidney stones 06/27/2015  . Knee pain 06/27/2015  . Abnormal mammogram 06/27/2015  . Headache, migraine 06/27/2015  . Neuropathy 06/27/2015  . Hand paresthesia 06/27/2015  . Pre-diabetes 06/27/2015  . Vitamin D deficiency 06/27/2015  .  Weight loss 06/27/2015  . Adjustment reaction 06/27/2015  . Chronic obstructive pulmonary disease ([place]) 02/24/2011  . Hypercholesterolemia without hypertriglyceridemia 01/29/2010  . Tobacco abuse 01/28/2010  . Benign essential HTN 09/16/2009  . Insomnia 09/16/2009  . Lesion of ulnar nerve 09/16/2009    Past Surgical History:  Procedure Laterality Date  . ANTERIOR CERVICAL DECOMP/DISCECTOMY FUSION N/A 12/24/2015   Procedure: Cervical five-six, Cervical six-seven  Anterior cervical decompression/diskectomy/fusion/interbody prosthesis/plate;  Surgeon: Newman Pies, MD;  Location: [place] [facility];  Service: Neurosurgery;  Laterality: N/A;  . COLONOSCOPY WITH PROPOFOL N/A 07/17/2018   Procedure: COLONOSCOPY WITH PROPOFOL;  Surgeon: Jonathon Bellows, MD;  Location: [facility];  Service: Gastroenterology;  Laterality: N/A;  . [place] and 1996   two Etopic preg.  [place] Kitchen Head Injuries  2006   surgery 2006 Cram/NS in [place]. Headaches with increased intracranial pressure. Repeat MRI in 2008 Negative  . SUPRACERVICAL ABDOMINAL HYSTERECTOMY  2011   Abdominal; Ovaries intact; CERVIX INTACT. Fibroids/dys menorrhea. [place]  . Ulnar Neuropathy Right 2004   Ulnar Neuropathy surgical revision. 2004-2008. [place].    Prior to Admission medications   Medication Sig Start Date End Date Taking? Authorizing Provider  albuterol (VENTOLIN HFA) 108 (90 Base) MCG/ACT inhaler Inhale 2 puffs into the lungs every 6 (six) hours as needed for wheezing or shortness of breath. 04/15/20   [name], MD  amLODipine (NORVASC) 10 MG tablet TAKE 1 TABLET BY MOUTH EVERY DAY 04/01/19   [name], MD  buPROPion (WELLBUTRIN SR) 150 MG 12 hr tablet TAKE 1 TABLET BY MOUTH TWICE  DAILY 05/25/20   [name], MD  cholecalciferol (VITAMIN D) 1000 units tablet Take 2,000 Units by mouth daily.     [provider]  estradiol (ESTRACE) 2 MG tablet Take 1 tablet (2 mg total) by mouth  daily. 04/01/19   [name], MD  gabapentin (NEURONTIN) 300 MG capsule TAKE 1 CAPSULE BY MOUTH THREE TIMES A DAY 10/24/19   [name], MD  hydrochlorothiazide (HYDRODIURIL) 12.5 MG tablet Take 1 tablet (12.5 mg total) by mouth daily. 04/15/20   [name], MD  HYDROcodone-acetaminophen (NORCO) 7.5-325 MG tablet Take 1 tablet by mouth every 6 (six) hours as needed for moderate pain. 07/09/20   [name], MD  meloxicam (MOBIC) 15 MG tablet TAKE 1 TABLET BY MOUTH EVERY DAY WITH FOOD 02/21/20   [name], MD  PARoxetine (PAXIL) 30 MG tablet TAKE 2 TABLETS (60 MG TOTAL) BY MOUTH DAILY. 10/09/19   [name], MD  pravastatin (PRAVACHOL) 20 MG tablet TAKE 1 TABLET BY MOUTH EVERY DAY 10/09/19   [name], MD  promethazine (PHENERGAN) 25 MG tablet TAKE 1 TABLET BY MOUTH EVERY 4 TO 6 HOURS AS NEEDED FOR NAUSEA 11/22/19   [name], MD  ranitidine (ZANTAC) 150 MG tablet RANITIDINE HCL, 150MG  (Oral Tablet)  1 Two Times A Day for heartburn, indigestion, nausea for 0 days  Quantity: 60.00;  Refills: 5   Ordered :22-Jul-2010  Reginia Forts MD;  Started 28-Jan-2010 Active Comments: DX: 787.02 01/28/10   [provider]    Allergies Aspirin, Penicillins, and Sulfa antibiotics  Family History  Problem Relation Age of Onset  . Hypertension Mother   . Diabetes Mother        Type 2  . Breast cancer Mother 44  . Cancer Sister 61       Breast  . Diabetes Sister        type 2  . Breast cancer Sister 56  . Diabetes Sister   . Diabetes Sister   . Diabetes Sister        type 2  . Liver cancer Brother   . Stomach cancer Brother     Social History Social History   Tobacco Use  . Smoking status: Current Every Day Smoker    Packs/day: 0.50  . Smokeless tobacco: Never Used  . Tobacco comment: Started about 59 years old usually 1/2 ppd  Vaping Use  . Vaping Use: Never used  Substance Use Topics  . Alcohol use: Not Currently    Alcohol/week: 0.0  standard drinks  . Drug use: Not Currently    Review of Systems Constitutional: negative for fever. ENT: negative for sore throat. Respiratory: negative3 Gastrointestinal: No abdominal pain.  No nausea, no vomiting.  No diarrhea.  Musculoskeletal: Negative for generalized body aches. Skin: Negative for rash/lesion/wound. Neurological: Positive for headaches, negative for focal weakness or numbness.  ____________________________________________   PHYSICAL EXAM:  VITAL SIGNS: ED Triage Vitals  Enc Vitals Group     BP 07/11/20 1650 (!) 159/88     Pulse Rate 07/11/20 1650 85     Resp 07/11/20 1650 16     Temp 07/11/20 1650 98.8 F (37.1 C)     Temp Source 07/11/20 1650 Oral     SpO2 07/11/20 1650 97 %     Weight 07/11/20 1651 185 lb (83.9 kg)     Height 07/11/20 1651 5\' 5"  (1.651 m)     Head Circumference --  Peak Flow --      Pain Score 07/11/20 1651 0     Pain Loc --      Pain Edu? --      Excl. in [place]? --     Constitutional: Alert and oriented. Well appearing and in no acute distress. Eyes: Conjunctivae are normal. Head: Atraumatic. Nose: No congestion/rhinnorhea. Mouth/Throat: Mucous membranes are moist. Neck: No stridor.  Cardiovascular: Normal rate, regular rhythm. Good peripheral circulation. Respiratory: Normal respiratory effort. Musculoskeletal: Full ROM throughout.  Neurologic:  Normal speech and language. No gross focal neurologic deficits are appreciated. Speech is normal. No gait instability. Skin:  Skin is warm, dry and intact. No rash noted. Psychiatric: Mood and affect are normal. Speech and behavior are normal.  ____________________________________________   LABS (all labs ordered are listed, but only abnormal results are displayed)  Labs Reviewed - No data to display ____________________________________________  EKG  Not indicated ____________________________________________  RADIOLOGY  Not  indicated ____________________________________________   PROCEDURES  None ____________________________________________   INITIAL IMPRESSION / ASSESSMENT AND PLAN / ED COURSE   59 year old female presenting to the emergency department for evaluation of elevated blood pressure.  See HPI for further details.  Triage vitals reveal a blood pressure is 159/88.  Patient states that it was significantly higher when she checked it with her wrist cuff and the arm cuff at [facility].  She is asymptomatic with the exception of AN intermittent, dull headache that can be resolved with Tylenol.  Plan today we will have her follow-up with her primary care provider to discuss further plan of care.  She does have some significant stressors in her life at the moment which may be driving her blood pressure.  She was encouraged to limit her sodium and processed food intake and to take her medications daily as prescribed.  She was given strict ER return precautions and signs and symptoms that would encourage her to return to the ER were discussed.  Pertinent labs & imaging results that were available during my care of the patient were reviewed by me and considered in my medical decision making (see chart for details).  ____________________________________________   FINAL CLINICAL IMPRESSION(S) / ED DIAGNOSES  Final diagnoses:  Hypertension, unspecified type       Victorino Dike, FNP 07/11/20 1857    Vladimir Crofts, MD 07/11/20 [phone]

## 2020-07-11 NOTE — Discharge Instructions (Signed)
Please continue taking your medication as prescribed.  Follow up with primary care as scheduled or sooner if able.  Return to the ER for symptoms that change or worsen.

## 2020-07-11 NOTE — ED Notes (Signed)
See triage note  Presents with htn   States she has had slight h/a for a couple of days and noticed that her b/p was elevated

## 2020-07-11 NOTE — Telephone Encounter (Signed)
  Elevated BP and headache. Patient states her BP has been elevated for couple days- she thought it was her wrist cuff- she went to pharmacy and it is elevated there as well.She does have headache.  Advised ED per protocol.  Reason for Disposition . [6] Systolic BP  >= 754 OR Diastolic >= 492 AND [0] cardiac or neurologic symptoms (e.g., chest pain, difficulty breathing, unsteady gait, blurred vision)  Answer Assessment - Initial Assessment Questions 1. BLOOD PRESSURE: "What is the blood pressure?" "Did you take at least two measurements 5 minutes apart?"     177/107, 187/105- at home with wrist cuff 2. ONSET: "When did you take your blood pressure?"     3:15 3. HOW: "How did you obtain the blood pressure?" (e.g., visiting nurse, automatic home BP monitor)     automatic  4. HISTORY: "Do you have a history of high blood pressure?"     yes 5. MEDICATIONS: "Are you taking any medications for blood pressure?" "Have you missed any doses recently?"     Yes- medication regular- no missed doses 6. OTHER SYMPTOMS: "Do you have any symptoms?" (e.g., headache, chest pain, blurred vision, difficulty breathing, weakness)     headache 7. PREGNANCY: "Is there any chance you are pregnant?" "When was your last menstrual period?"     n/a  Protocols used: BLOOD PRESSURE - HIGH-A-AH

## 2020-07-11 NOTE — ED Triage Notes (Signed)
Pt reports here for elevated bp at home.  NAD. No pain at this time. Has had a mild headache.  VSS.

## 2020-07-16 ENCOUNTER — Telehealth: Payer: Self-pay

## 2020-07-16 NOTE — Telephone Encounter (Signed)
Contacted patient for lung CT screening clinic based on referral from Dr. Caryn Section.  Patient agreeable to program and scan set for Oct 6 at 10:00.  Patient given address to imaging center and phone number to [facility], lung navigator.  Patient is a current smoker and started smoking consistently at age 59.  She smoked briefly at age 59.  She smokes less than a pack a day and after further conversation learned she smokes about 5 cigarettes a day.  She has [employer] and [employer].

## 2020-07-21 ENCOUNTER — Encounter: Payer: Self-pay | Admitting: Family Medicine

## 2020-07-21 ENCOUNTER — Ambulatory Visit (INDEPENDENT_AMBULATORY_CARE_PROVIDER_SITE_OTHER): Payer: BC Managed Care – PPO | Admitting: Family Medicine

## 2020-07-21 ENCOUNTER — Other Ambulatory Visit: Payer: Self-pay

## 2020-07-21 VITALS — BP 143/90 | HR 83 | Temp 97.9°F | Ht 65.0 in | Wt 189.0 lb

## 2020-07-21 DIAGNOSIS — I10 Essential (primary) hypertension: Secondary | ICD-10-CM | POA: Diagnosis not present

## 2020-07-21 DIAGNOSIS — Z23 Encounter for immunization: Secondary | ICD-10-CM | POA: Diagnosis not present

## 2020-07-21 DIAGNOSIS — E7800 Pure hypercholesterolemia, unspecified: Secondary | ICD-10-CM

## 2020-07-21 DIAGNOSIS — E559 Vitamin D deficiency, unspecified: Secondary | ICD-10-CM | POA: Diagnosis not present

## 2020-07-21 DIAGNOSIS — R7303 Prediabetes: Secondary | ICD-10-CM

## 2020-07-21 DIAGNOSIS — E039 Hypothyroidism, unspecified: Secondary | ICD-10-CM

## 2020-07-21 NOTE — Progress Notes (Signed)
Established patient visit   Patient: Jamie Williams   DOB: 24-Dec-1960   59 y.o. Female  MRN: [id number] Visit Date: 07/21/2020  Today's healthcare provider: Lelon Huh, MD   Chief Complaint  Patient presents with  . Hypertension   Subjective    HPI  Hypertension, follow-up  BP Readings from Last 3 Encounters:  07/21/20 (!) 143/90  07/11/20 (!) 159/88  04/15/20 (!) 150/88   Wt Readings from Last 3 Encounters:  07/21/20 189 lb (85.7 kg)  07/11/20 185 lb (83.9 kg)  04/15/20 185 lb (83.9 kg)     She was last seen for hypertension 3 months ago.  BP at that visit was 150/88. Management since that visit includes adding HCTZ 12.5 MG.  She reports excellent compliance with treatment. She had episode of very high blood pressure when checked at pharmacy a few weeks ago associated with mild headache. She was advised to go to ER and BP came down spontaneously to 159/88. She has since been checking at home with automatic brachial cuff and states that systolic BP was in the 782N.  She is not having side effects.  She is following a Regular diet. She is exercising. She does smoke.  Use of agents associated with hypertension: none.   Outside blood pressures are 138/88.   Pertinent labs: Lab Results  Component Value Date   CHOL 184 10/15/2019   HDL 54 10/15/2019   LDLCALC 112 (H) 10/15/2019   TRIG 99 10/15/2019   CHOLHDL 3.4 10/15/2019   Lab Results  Component Value Date   NA 143 10/15/2019   K 3.7 10/15/2019   CREATININE 0.90 10/15/2019   GFRNONAA 72 10/15/2019   GFRAA 83 10/15/2019   GLUCOSE 97 10/15/2019     The 10-year ASCVD risk score Mikey Bussing DC Jr., et al., 2013) is: 14.5%   --------------------------------------------------------------------------------------------------- She also continues on pravastatin which she is tolerating well for her cholesterol.  Lipid Panel     Component Value Date/Time   CHOL 184 10/15/2019 1120   TRIG 99 10/15/2019 1120    HDL 54 10/15/2019 1120   CHOLHDL 3.4 10/15/2019 1120   LDLCALC 112 (H) 10/15/2019 1120   LABVLDL 18 10/15/2019 1120    She continue vitamin D Lab Results  Component Value Date   VD25OH 28.1 (L) 10/15/2019    She is due to check a1c for prediabetes Lab Results  Component Value Date   HGBA1C 6.0 (H) 07/13/2019     Social History   Tobacco Use  . Smoking status: Current Every Day Smoker    Packs/day: 0.50  . Smokeless tobacco: Never Used  . Tobacco comment: Started about 59 years old usually 1/2 ppd  Vaping Use  . Vaping Use: Never used  Substance Use Topics  . Alcohol use: Not Currently    Alcohol/week: 0.0 standard drinks  . Drug use: Not Currently       Medications: Outpatient Medications Prior to Visit  Medication Sig  . albuterol (VENTOLIN HFA) 108 (90 Base) MCG/ACT inhaler Inhale 2 puffs into the lungs every 6 (six) hours as needed for wheezing or shortness of breath.  Marland Kitchen amLODipine (NORVASC) 10 MG tablet TAKE 1 TABLET BY MOUTH EVERY DAY  . buPROPion (WELLBUTRIN SR) 150 MG 12 hr tablet TAKE 1 TABLET BY MOUTH TWICE DAILY  . cholecalciferol (VITAMIN D) 1000 units tablet Take 2,000 Units by mouth daily.   Marland Kitchen estradiol (ESTRACE) 2 MG tablet Take 1 tablet (2 mg total) by mouth daily.  Marland Kitchen  gabapentin (NEURONTIN) 300 MG capsule TAKE 1 CAPSULE BY MOUTH THREE TIMES A DAY  . hydrochlorothiazide (HYDRODIURIL) 12.5 MG tablet Take 1 tablet (12.5 mg total) by mouth daily.  Marland Kitchen HYDROcodone-acetaminophen (NORCO) 7.5-325 MG tablet Take 1 tablet by mouth every 6 (six) hours as needed for moderate pain.  . meloxicam (MOBIC) 15 MG tablet TAKE 1 TABLET BY MOUTH EVERY DAY WITH FOOD  . PARoxetine (PAXIL) 30 MG tablet TAKE 2 TABLETS (60 MG TOTAL) BY MOUTH DAILY.  . pravastatin (PRAVACHOL) 20 MG tablet TAKE 1 TABLET BY MOUTH EVERY DAY  . promethazine (PHENERGAN) 25 MG tablet TAKE 1 TABLET BY MOUTH EVERY 4 TO 6 HOURS AS NEEDED FOR NAUSEA  . ranitidine (ZANTAC) 150 MG tablet RANITIDINE HCL, 150MG   (Oral Tablet)  1 Two Times A Day for heartburn, indigestion, nausea for 0 days  Quantity: 60.00;  Refills: 5   Ordered :22-Jul-2010  Reginia Forts MD;  Buddy Duty 28-Jan-2010 Active Comments: DX: 787.02   No facility-administered medications prior to visit.    Review of Systems  Constitutional: Negative for fever, malaise/fatigue and weight loss.  Respiratory: Negative for shortness of breath.   Cardiovascular: Negative for chest pain, palpitations and orthopnea.  Neurological: Negative for dizziness, sensory change, focal weakness and weakness.      Objective    BP (!) 143/90 (BP Location: Right Arm, Patient Position: Sitting, Cuff Size: Large)   Pulse 83   Temp 97.9 F (36.6 C) (Oral)   Ht 5\' 5"  (1.651 m)   Wt 189 lb (85.7 kg)   BMI 31.45 kg/m    Physical Exam   General: Appearance:    Obese female in no acute distress  Eyes:    PERRL, conjunctiva/corneas clear, EOM's intact       Lungs:     Clear to auscultation bilaterally, respirations unlabored  Heart:    Normal heart rate. Normal rhythm. No murmurs, rubs, or gallops.   MS:   All extremities are intact.   Neurologic:   Awake, alert, oriented x 3. No apparent focal neurological           defect.         No results found for any visits on 07/21/20.  Assessment & Plan     1. Benign essential HTN Doing well with hctz, but outside BP has been labile. Consider increasing to 25 after reviewing labsd.  - TSH  2. Vitamin D deficiency  - VITAMIN D 25 Hydroxy (Vit-D Deficiency, Fractures)  3. Pre-diabetes  - Hemoglobin A1c  4. Hypercholesterolemia without hypertriglyceridemia She is tolerating pravastatin well with no adverse effects.   - Comprehensive metabolic panel - Lipid panel  5. Need for influenza vaccination  - Flu Vaccine QUAD 6+ mos PF IM (Fluarix Quad PF)         The entirety of the information documented in the History of Present Illness, Review of Systems and Physical Exam were personally  obtained by me. Portions of this information were initially documented by the CMA and reviewed by me for thoroughness and accuracy.      Lelon Huh, MD  [facility] [phone] (phone) [phone] (fax)  [place]

## 2020-07-21 NOTE — Telephone Encounter (Signed)
Contacted patient to review and clarify smoking history. Patient reports smoking 2 - 3 cigarettes per day for 27 years at most. She is made aware that she does not fall into the high risk category that would make her eligible for lung cancer screening scans. Assistance with smoking cessation offered.

## 2020-07-22 DIAGNOSIS — E039 Hypothyroidism, unspecified: Secondary | ICD-10-CM | POA: Insufficient documentation

## 2020-07-22 LAB — HEMOGLOBIN A1C
Est. average glucose Bld gHb Est-mCnc: 128 mg/dL
Hgb A1c MFr Bld: 6.1 % — ABNORMAL HIGH (ref 4.8–5.6)

## 2020-07-22 LAB — LIPID PANEL
Chol/HDL Ratio: 3.4 ratio (ref 0.0–4.4)
Cholesterol, Total: 210 mg/dL — ABNORMAL HIGH (ref 100–199)
HDL: 62 mg/dL (ref >39)
LDL Chol Calc (NIH): 132 mg/dL — ABNORMAL HIGH (ref 0–99)
Triglycerides: 89 mg/dL (ref 0–149)
VLDL Cholesterol Cal: 16 mg/dL (ref 5–40)

## 2020-07-22 LAB — TSH: TSH: 5.12 u[IU]/mL — ABNORMAL HIGH (ref 0.450–4.500)

## 2020-07-22 LAB — COMPREHENSIVE METABOLIC PANEL
ALT: 16 IU/L (ref 0–32)
AST: 16 IU/L (ref 0–40)
Albumin/Globulin Ratio: 2 (ref 1.2–2.2)
Albumin: 4.8 g/dL (ref 3.8–4.9)
Alkaline Phosphatase: 146 IU/L — ABNORMAL HIGH (ref 44–121)
BUN/Creatinine Ratio: 12 (ref 9–23)
BUN: 10 mg/dL (ref 6–24)
Bilirubin Total: 0.3 mg/dL (ref 0.0–1.2)
CO2: 20 mmol/L (ref 20–29)
Calcium: 10.3 mg/dL — ABNORMAL HIGH (ref 8.7–10.2)
Chloride: 102 mmol/L (ref 96–106)
Creatinine, Ser: 0.84 mg/dL (ref 0.57–1.00)
GFR calc Af Amer: 89 mL/min/{1.73_m2} (ref >59)
GFR calc non Af Amer: 77 mL/min/{1.73_m2} (ref >59)
Globulin, Total: 2.4 g/dL (ref 1.5–4.5)
Glucose: 112 mg/dL — ABNORMAL HIGH (ref 65–99)
Potassium: 3.9 mmol/L (ref 3.5–5.2)
Sodium: 140 mmol/L (ref 134–144)
Total Protein: 7.2 g/dL (ref 6.0–8.5)

## 2020-07-22 LAB — VITAMIN D 25 HYDROXY (VIT D DEFICIENCY, FRACTURES): Vit D, 25-Hydroxy: 37.5 ng/mL (ref 30.0–100.0)

## 2020-07-25 ENCOUNTER — Other Ambulatory Visit: Payer: Self-pay | Admitting: Family Medicine

## 2020-07-25 ENCOUNTER — Telehealth: Payer: Self-pay

## 2020-07-25 DIAGNOSIS — S149XXA Injury of unspecified nerves of neck, initial encounter: Secondary | ICD-10-CM

## 2020-07-25 DIAGNOSIS — I10 Essential (primary) hypertension: Secondary | ICD-10-CM

## 2020-07-25 MED ORDER — HYDROCODONE-ACETAMINOPHEN 7.5-325 MG PO TABS: 1.0000 | ORAL_TABLET | Freq: Four times a day (QID) | ORAL | 0 refills | Status: DC | PRN

## 2020-07-25 MED ORDER — HYDROCHLOROTHIAZIDE 25 MG PO TABS: 25.0000 mg | ORAL_TABLET | Freq: Every day | ORAL | 2 refills | Status: DC

## 2020-07-25 NOTE — Telephone Encounter (Signed)
Medication Refill - Medication:HYDROcodone-acetaminophen (NORCO) 7.5-325 MG tablet    Has the patient contacted their pharmacy? yes (Agent: If no, request that the patient contact the pharmacy for the refill.) (Agent: If yes, when and what did the pharmacy advise?)Contact PCP  Preferred Pharmacy (with phone number or street name):  [id number], [place] Phone:  [phone]  Fax:  [phone]       Agent: Please be advised that RX refills may take up to 3 business days. We ask that you follow-up with your pharmacy.

## 2020-07-25 NOTE — Telephone Encounter (Signed)
-----   Message from [name], MD sent at 07/22/2020  7:55 AM EDT ----- Cholesterol is up to 210, should be under 200. Try to be more strict with diet and continue pravastatin Is borderline hypothyroid which can lower metabolism. Marland Kitchen Rest of labs are good.  Need to increase hctz to 25mg  once a day, #30, rf x 2 for better blood pressure control. .   Please schedule follow up 2 months to check on blood pressure and thyroid.

## 2020-07-25 NOTE — Telephone Encounter (Signed)
See below. KW 

## 2020-07-25 NOTE — Telephone Encounter (Signed)
Patient advised of lab results and medication will be sent to patient's pharmacy.

## 2020-07-25 NOTE — Telephone Encounter (Signed)
Requested medication (s) are due for refill today: Yes  Requested medication (s) are on the active medication list: Yes  Last refill:  07/09/20  Future visit scheduled: No  Notes to clinic:  See request.    Requested Prescriptions  Pending Prescriptions Disp Refills   HYDROcodone-acetaminophen (NORCO) 7.5-325 MG tablet 30 tablet 0    Sig: Take 1 tablet by mouth every 6 (six) hours as needed for moderate pain.      Not Delegated - Analgesics:  Opioid Agonist Combinations Failed - 07/25/2020 10:45 AM      Failed - This refill cannot be delegated      Failed - Urine Drug Screen completed in last 360 days.      Passed - Valid encounter within last 6 months    Recent Outpatient Visits           4 days ago Benign essential HTN   [facility] [name], MD   3 months ago Benign essential HTN   [facility] [name], MD   9 months ago Benign essential HTN   [facility] [name], MD   1 year ago Benign essential HTN   [facility] [name], MD   1 year ago Menopausal symptoms   [place], Kirstie Peri, MD

## 2020-07-25 NOTE — Telephone Encounter (Signed)
Called to advise patient of lab results, no answer. Left voicemail for patient to call back. If patient calls back it is okay for [facility] to advise patient of results.

## 2020-07-28 ENCOUNTER — Other Ambulatory Visit: Payer: Self-pay | Admitting: Family Medicine

## 2020-07-28 NOTE — Telephone Encounter (Signed)
Requested medication (s) are due for refill today Yes  Requested medication (s) are on the active medication list Yes  Future visit scheduled No.  Last OV 07/01/20.   Note to clinic-This medication is not delegated for [facility] to refill. Routing to provider for consideration.   Requested Prescriptions  Pending Prescriptions Disp Refills   promethazine (PHENERGAN) 25 MG tablet [Pharmacy Med Name: PROMETHAZINE 25MG  TABLETS] 30 tablet 5    Sig: TAKE 1 TABLET BY MOUTH EVERY 4 TO 6 HOURS AS NEEDED FOR NAUSEA      Not Delegated - Gastroenterology: Antiemetics Failed - 07/28/2020 10:55 AM      Failed - This refill cannot be delegated      Passed - Valid encounter within last 6 months    Recent Outpatient Visits           1 week ago Benign essential HTN   [facility] [name], MD   3 months ago Benign essential HTN   [facility] [name], MD   9 months ago Benign essential HTN   [facility] [name], MD   1 year ago Benign essential HTN   [facility] [name], MD   1 year ago Menopausal symptoms   [place], Kirstie Peri, MD

## 2020-08-06 ENCOUNTER — Other Ambulatory Visit: Payer: Self-pay | Admitting: Family Medicine

## 2020-08-06 DIAGNOSIS — S149XXA Injury of unspecified nerves of neck, initial encounter: Secondary | ICD-10-CM

## 2020-08-06 DIAGNOSIS — Z1231 Encounter for screening mammogram for malignant neoplasm of breast: Secondary | ICD-10-CM

## 2020-08-06 MED ORDER — HYDROCODONE-ACETAMINOPHEN 7.5-325 MG PO TABS: 1.0000 | ORAL_TABLET | Freq: Four times a day (QID) | ORAL | 0 refills | Status: DC | PRN

## 2020-08-06 NOTE — Telephone Encounter (Signed)
Requested medication (s) are due for refill today: Yes  Requested medication (s) are on the active medication list: Yes  Last refill:  07/25/20  Future visit scheduled: Yes  Notes to clinic:  See request.    Requested Prescriptions  Pending Prescriptions Disp Refills   HYDROcodone-acetaminophen (NORCO) 7.5-325 MG tablet 30 tablet 0    Sig: Take 1 tablet by mouth every 6 (six) hours as needed for moderate pain.      Not Delegated - Analgesics:  Opioid Agonist Combinations Failed - 08/06/2020 11:34 AM      Failed - This refill cannot be delegated      Failed - Urine Drug Screen completed in last 360 days.      Passed - Valid encounter within last 6 months    Recent Outpatient Visits           2 weeks ago Benign essential HTN   [facility] [name], MD   3 months ago Benign essential HTN   [facility] [name], MD   9 months ago Benign essential HTN   [facility] [name], MD   1 year ago Benign essential HTN   [facility] [name], MD   1 year ago Menopausal symptoms   [facility] [name], MD       Future Appointments             In 2 months Fisher, Kirstie Peri, MD [facility], [place]

## 2020-08-06 NOTE — Telephone Encounter (Signed)
HYDROcodone-acetaminophen (NORCO) 7.5-325 MG tablet Medication Date: 07/25/2020 Department: [facility] Ordering/Authorizing: [name], MD   [place] [phone] [address], [place] Phone:  [phone]  Fax:  [phone]

## 2020-08-07 ENCOUNTER — Other Ambulatory Visit: Payer: Self-pay | Admitting: Family Medicine

## 2020-09-11 ENCOUNTER — Other Ambulatory Visit: Payer: Self-pay | Admitting: Family Medicine

## 2020-09-11 DIAGNOSIS — S149XXA Injury of unspecified nerves of neck, initial encounter: Secondary | ICD-10-CM

## 2020-09-11 MED ORDER — HYDROCODONE-ACETAMINOPHEN 7.5-325 MG PO TABS: 1.0000 | ORAL_TABLET | Freq: Four times a day (QID) | ORAL | 0 refills | Status: DC | PRN

## 2020-09-11 NOTE — Telephone Encounter (Signed)
Pt request refill   HYDROcodone-acetaminophen (NORCO) 7.5-325 MG tablet   [facility] #[id number] [address], [address] AT [place] Phone:  [phone]  Fax:  [phone]

## 2020-09-11 NOTE — Telephone Encounter (Signed)
Requested medication (s) are due for refill today - yes  Requested medication (s) are on the active medication list -yes  Future visit scheduled -yes  Last refill: 08/06/20  Notes to clinic: request non delegated Rx  Requested Prescriptions  Pending Prescriptions Disp Refills   HYDROcodone-acetaminophen (NORCO) 7.5-325 MG tablet 30 tablet 0    Sig: Take 1 tablet by mouth every 6 (six) hours as needed for moderate pain.      Not Delegated - Analgesics:  Opioid Agonist Combinations Failed - 09/11/2020  1:33 PM      Failed - This refill cannot be delegated      Failed - Urine Drug Screen completed in last 360 days      Passed - Valid encounter within last 6 months    Recent Outpatient Visits           1 month ago Benign essential HTN   [facility] [name], MD   4 months ago Benign essential HTN   [facility] [name], MD   11 months ago Benign essential HTN   [facility] [name], MD   1 year ago Benign essential HTN   [facility] [name], MD   1 year ago Menopausal symptoms   [facility] [name], MD       Future Appointments             In 3 weeks Fisher, Kirstie Peri, MD [facility], [facility]                Requested Prescriptions  Pending Prescriptions Disp Refills   HYDROcodone-acetaminophen (NORCO) 7.5-325 MG tablet 30 tablet 0    Sig: Take 1 tablet by mouth every 6 (six) hours as needed for moderate pain.      Not Delegated - Analgesics:  Opioid Agonist Combinations Failed - 09/11/2020  1:33 PM      Failed - This refill cannot be delegated      Failed - Urine Drug Screen completed in last 360 days      Passed - Valid encounter within last 6 months    Recent Outpatient Visits           1 month ago Benign essential HTN   [facility] [name], MD   4 months ago Benign essential HTN   [facility] [name], MD   11 months ago Benign essential HTN   [facility] [name], MD   1 year ago Benign essential HTN   [facility] [name], MD   1 year ago Menopausal symptoms   [facility] [name], MD       Future Appointments             In 3 weeks Fisher, Kirstie Peri, MD [facility], [place]

## 2020-10-06 ENCOUNTER — Ambulatory Visit (INDEPENDENT_AMBULATORY_CARE_PROVIDER_SITE_OTHER): Payer: BC Managed Care – PPO | Admitting: Family Medicine

## 2020-10-06 ENCOUNTER — Encounter: Payer: Self-pay | Admitting: Family Medicine

## 2020-10-06 ENCOUNTER — Other Ambulatory Visit: Payer: Self-pay

## 2020-10-06 VITALS — BP 134/80 | HR 89 | Temp 97.9°F | Resp 16 | Ht 65.0 in | Wt 189.0 lb

## 2020-10-06 DIAGNOSIS — I10 Essential (primary) hypertension: Secondary | ICD-10-CM

## 2020-10-06 DIAGNOSIS — M1710 Unilateral primary osteoarthritis, unspecified knee: Secondary | ICD-10-CM | POA: Diagnosis not present

## 2020-10-06 DIAGNOSIS — E039 Hypothyroidism, unspecified: Secondary | ICD-10-CM

## 2020-10-06 NOTE — Patient Instructions (Signed)
.   Please review the attached list of medications and notify my office if there are any errors.   . Please bring all of your medications to every appointment so we can make sure that our medication list is the same as yours.   

## 2020-10-06 NOTE — Progress Notes (Signed)
Established patient visit   Patient: Jamie Williams   DOB: 1961-04-29   59 y.o. Female  MRN: [id number] Visit Date: 10/06/2020  Today's healthcare provider: Lelon Huh, MD   Chief Complaint  Patient presents with  . Hypertension  . Hypothyroidism   Subjective    HPI  Hypertension, follow-up  BP Readings from Last 3 Encounters:  10/06/20 134/80  07/21/20 (!) 143/90  07/11/20 (!) 159/88   Wt Readings from Last 3 Encounters:  10/06/20 189 lb (85.7 kg)  07/21/20 189 lb (85.7 kg)  07/11/20 185 lb (83.9 kg)     She was last seen for hypertension 2 months ago.  BP at that visit was 143/90. Management since that visit includes increasing HCTZ to 25mg  daily.  She reports good compliance with treatment. She is not having side effects.  She is following a Regular diet. She is exercising. She does smoke.  Use of agents associated with hypertension: none.   Outside blood pressures are checked occasionally. Symptoms: No chest pain No chest pressure  No palpitations No syncope  No dyspnea No orthopnea  No paroxysmal nocturnal dyspnea No lower extremity edema   Pertinent labs: Lab Results  Component Value Date   CHOL 210 (H) 07/21/2020   HDL 62 07/21/2020   LDLCALC 132 (H) 07/21/2020   TRIG 89 07/21/2020   CHOLHDL 3.4 07/21/2020   Lab Results  Component Value Date   NA 140 07/21/2020   K 3.9 07/21/2020   CREATININE 0.84 07/21/2020   GFRNONAA 77 07/21/2020   GFRAA 89 07/21/2020   GLUCOSE 112 (H) 07/21/2020     The 10-year ASCVD risk score Mikey Bussing DC Jr., et al., 2013) is: 12%   Hypothyroid, follow-up  Lab Results  Component Value Date   TSH 5.120 (H) 07/21/2020   TSH 2.790 10/15/2019   TSH 1.490 03/07/2018   Wt Readings from Last 3 Encounters:  10/06/20 189 lb (85.7 kg)  07/21/20 189 lb (85.7 kg)  07/11/20 185 lb (83.9 kg)    She was last seen 2 months ago.  Management since that visit includes no medication changes. Labs were borderline  hypothyroid on 07/21/2020. Due to recheck today.  She reports good compliance with treatment. She is not having side effects.   Symptoms: No change in energy level No constipation  No diarrhea No heat / cold intolerance  No nervousness No palpitations  No weight changes    Knee Pain  Patient reports that she has had pain in her left knee X 2 months. She denies any injuries.  Had had off and on for years and previously told by Dr. Sabra Heck that there was no cushioning in her knee.      Medications: Outpatient Medications Prior to Visit  Medication Sig  . albuterol (VENTOLIN HFA) 108 (90 Base) MCG/ACT inhaler Inhale 2 puffs into the lungs every 6 (six) hours as needed for wheezing or shortness of breath.  Marland Kitchen amLODipine (NORVASC) 10 MG tablet TAKE 1 TABLET BY MOUTH EVERY DAY  . buPROPion (WELLBUTRIN SR) 150 MG 12 hr tablet TAKE 1 TABLET BY MOUTH TWICE DAILY  . cholecalciferol (VITAMIN D) 1000 units tablet Take 2,000 Units by mouth daily.   Marland Kitchen estradiol (ESTRACE) 2 MG tablet Take 1 tablet (2 mg total) by mouth daily.  Marland Kitchen gabapentin (NEURONTIN) 300 MG capsule TAKE 1 CAPSULE BY MOUTH THREE TIMES A DAY  . hydrochlorothiazide (HYDRODIURIL) 25 MG tablet Take 1 tablet (25 mg total) by mouth daily.  Marland Kitchen  HYDROcodone-acetaminophen (NORCO) 7.5-325 MG tablet Take 1 tablet by mouth every 6 (six) hours as needed for moderate pain.  . meloxicam (MOBIC) 15 MG tablet TAKE 1 TABLET BY MOUTH EVERY DAY WITH FOOD  . PARoxetine (PAXIL) 30 MG tablet TAKE 2 TABLETS (60 MG TOTAL) BY MOUTH DAILY.  . pravastatin (PRAVACHOL) 20 MG tablet TAKE 1 TABLET BY MOUTH EVERY DAY  . promethazine (PHENERGAN) 25 MG tablet TAKE 1 TABLET BY MOUTH EVERY 4 TO 6 HOURS AS NEEDED FOR NAUSEA  . ranitidine (ZANTAC) 150 MG tablet RANITIDINE HCL, 150MG  (Oral Tablet)  1 Two Times A Day for heartburn, indigestion, nausea for 0 days  Quantity: 60.00;  Refills: 5   Ordered :22-Jul-2010  Reginia Forts MD;  Buddy Duty 28-Jan-2010 Active Comments: DX:  787.02   No facility-administered medications prior to visit.    Review of Systems  Constitutional: Negative.   Respiratory: Negative.   Cardiovascular: Negative for chest pain and leg swelling.  Endocrine: Negative.   Musculoskeletal: Positive for arthralgias.  Neurological: Negative.       Objective    BP 134/80   Pulse 89   Temp 97.9 F (36.6 C)   Resp 16   Ht 5\' 5"  (1.651 m)   Wt 189 lb (85.7 kg)   BMI 31.45 kg/m    Physical Exam   General: Appearance:    Obese female in no acute distress  Eyes:    PERRL, conjunctiva/corneas clear, EOM's intact       Lungs:     Clear to auscultation bilaterally, respirations unlabored  Heart:    Normal heart rate. Normal rhythm. No murmurs, rubs, or gallops.   MS:   All extremities are intact.   Neurologic:   Awake, alert, oriented x 3. No apparent focal neurological           defect.         Assessment & Plan     1. Benign essential HTN Much better with increased dose. Continue current medications.   - Magnesium - Renal function panel  2. Hypothyroidism, unspecified type Check  - TSH  3. Primary osteoarthritis of knee, unspecified laterality Recommend trial of OTC glucosamine. Consider prescription NSAIDs if not helping.            Lelon Huh, MD  [facility] [phone] (phone) [phone] (fax)  [place]

## 2020-10-07 LAB — RENAL FUNCTION PANEL
Albumin: 4.6 g/dL (ref 3.8–4.9)
BUN/Creatinine Ratio: 15 (ref 9–23)
BUN: 14 mg/dL (ref 6–24)
CO2: 22 mmol/L (ref 20–29)
Calcium: 10.7 mg/dL — ABNORMAL HIGH (ref 8.7–10.2)
Chloride: 103 mmol/L (ref 96–106)
Creatinine, Ser: 0.96 mg/dL (ref 0.57–1.00)
GFR calc Af Amer: 76 mL/min/{1.73_m2} (ref >59)
GFR calc non Af Amer: 66 mL/min/{1.73_m2} (ref >59)
Glucose: 117 mg/dL — ABNORMAL HIGH (ref 65–99)
Phosphorus: 4.4 mg/dL — ABNORMAL HIGH (ref 3.0–4.3)
Potassium: 4.1 mmol/L (ref 3.5–5.2)
Sodium: 140 mmol/L (ref 134–144)

## 2020-10-07 LAB — MAGNESIUM: Magnesium: 2.1 mg/dL (ref 1.6–2.3)

## 2020-10-07 LAB — TSH: TSH: 5.41 u[IU]/mL — ABNORMAL HIGH (ref 0.450–4.500)

## 2020-10-08 ENCOUNTER — Telehealth: Payer: Self-pay

## 2020-10-08 DIAGNOSIS — E039 Hypothyroidism, unspecified: Secondary | ICD-10-CM

## 2020-10-08 MED ORDER — LEVOTHYROXINE SODIUM 50 MCG PO TABS: 50.0000 ug | ORAL_TABLET | Freq: Every day | ORAL | 0 refills | Status: DC

## 2020-10-08 NOTE — Telephone Encounter (Signed)
-----   Message from [name], MD sent at 10/07/2020  7:46 AM EST ----- Labs are good. Is borderline for hyPOthyroid. This might cause some leg aching and fatigue. Recommend start levothyroxine 50 mcg once daily, #90. Rf x 0. Need follow up in 11-12 weeks.  Also, I forgot to put the name of the supplement for her to take for knee pain on the AVS. She can take glucosamine. Directions are on label.

## 2020-10-08 NOTE — Telephone Encounter (Signed)
Patient advised and agrees with treatment plan. Prescription sent into pharmacy. Follow up appointment scheduled for 01/07/2021 at 8:40am.

## 2020-10-13 ENCOUNTER — Other Ambulatory Visit: Payer: Self-pay | Admitting: Family Medicine

## 2020-10-13 DIAGNOSIS — I10 Essential (primary) hypertension: Secondary | ICD-10-CM

## 2020-11-05 ENCOUNTER — Other Ambulatory Visit: Payer: Self-pay | Admitting: Family Medicine

## 2020-11-13 ENCOUNTER — Other Ambulatory Visit: Payer: Self-pay | Admitting: Family Medicine

## 2020-11-13 DIAGNOSIS — S149XXA Injury of unspecified nerves of neck, initial encounter: Secondary | ICD-10-CM

## 2020-11-13 MED ORDER — HYDROCODONE-ACETAMINOPHEN 7.5-325 MG PO TABS: 1.0000 | ORAL_TABLET | Freq: Four times a day (QID) | ORAL | 0 refills | Status: DC | PRN

## 2020-11-13 NOTE — Telephone Encounter (Signed)
Medication Refill - Medication: HYDROcodone-acetaminophen ([place]) 7.5-325 MG tablet     Preferred Pharmacy (with phone number or street name): [facility] #[id number] [address], [place] Phone:  [phone]  Fax:  [phone]       Agent: Please be advised that RX refills may take up to 3 business days. We ask that you follow-up with your pharmacy.

## 2020-11-13 NOTE — Telephone Encounter (Signed)
Please review for refill. Refill not delegated per protocol. Last OV: 10/06/20 Last refill on 09/11/20 #30

## 2020-12-08 ENCOUNTER — Other Ambulatory Visit: Payer: Self-pay | Admitting: Family Medicine

## 2020-12-08 DIAGNOSIS — I10 Essential (primary) hypertension: Secondary | ICD-10-CM

## 2020-12-25 ENCOUNTER — Other Ambulatory Visit: Payer: Self-pay | Admitting: Family Medicine

## 2020-12-25 DIAGNOSIS — I10 Essential (primary) hypertension: Secondary | ICD-10-CM

## 2020-12-25 DIAGNOSIS — S149XXA Injury of unspecified nerves of neck, initial encounter: Secondary | ICD-10-CM

## 2020-12-25 MED ORDER — HYDROCODONE-ACETAMINOPHEN 7.5-325 MG PO TABS: 1.0000 | ORAL_TABLET | Freq: Four times a day (QID) | ORAL | 0 refills | Status: DC | PRN

## 2020-12-25 NOTE — Telephone Encounter (Signed)
Requested medication (s) are due for refill today -yes  Requested medication (s) are on the active medication list -yes  Future visit scheduled -yes  Last refill: 11/13/20  Notes to clinic: Request non delegated Rx  Requested Prescriptions  Pending Prescriptions Disp Refills   HYDROcodone-acetaminophen (NORCO) 7.5-325 MG tablet 30 tablet 0    Sig: Take 1 tablet by mouth every 6 (six) hours as needed for moderate pain.      Not Delegated - Analgesics:  Opioid Agonist Combinations Failed - 12/25/2020  1:42 PM      Failed - This refill cannot be delegated      Failed - Urine Drug Screen completed in last 360 days      Passed - Valid encounter within last 6 months    Recent Outpatient Visits           2 months ago Benign essential HTN   [facility] [name], MD   5 months ago Benign essential HTN   [facility] [name], MD   8 months ago Benign essential HTN   [facility] [name], MD   1 year ago Benign essential HTN   [facility] [name], MD   1 year ago Benign essential HTN   [facility] [name], MD       Future Appointments             In 1 week Fisher, Kirstie Peri, MD [facility], [facility]                 Requested Prescriptions  Pending Prescriptions Disp Refills   HYDROcodone-acetaminophen (NORCO) 7.5-325 MG tablet 30 tablet 0    Sig: Take 1 tablet by mouth every 6 (six) hours as needed for moderate pain.      Not Delegated - Analgesics:  Opioid Agonist Combinations Failed - 12/25/2020  1:42 PM      Failed - This refill cannot be delegated      Failed - Urine Drug Screen completed in last 360 days      Passed - Valid encounter within last 6 months    Recent Outpatient Visits           2 months ago Benign essential HTN   [facility] [name], MD   5 months ago Benign essential HTN   [facility] [name], MD   8 months ago Benign essential HTN   [facility] [name], MD   1 year ago Benign essential HTN   [facility] [name], MD   1 year ago Benign essential HTN   [facility] [name], MD       Future Appointments             In 1 week Fisher, Kirstie Peri, MD [facility], [place]

## 2020-12-25 NOTE — Telephone Encounter (Signed)
Medication Refill - Medication: HYDROcodone-acetaminophen (NORCO) 7.5-325 MG tablet    Has the patient contacted their pharmacy? No.Controlled Substance (Agent: If no, request that the patient contact the pharmacy for the refill.) (Agent: If yes, when and what did the pharmacy advise?)  Preferred Pharmacy (with phone number or street name):  [facility] #[id number] [address], [place]  [place] [address]  Phone: [phone] Fax: [phone]     Agent: Please be advised that RX refills may take up to 3 business days. We ask that you follow-up with your pharmacy.

## 2021-01-07 ENCOUNTER — Ambulatory Visit (INDEPENDENT_AMBULATORY_CARE_PROVIDER_SITE_OTHER): Payer: BC Managed Care – PPO | Admitting: Family Medicine

## 2021-01-07 ENCOUNTER — Other Ambulatory Visit: Payer: Self-pay

## 2021-01-07 ENCOUNTER — Encounter: Payer: Self-pay | Admitting: Family Medicine

## 2021-01-07 VITALS — BP 154/89 | HR 81 | Temp 97.0°F | Resp 16 | Wt 185.0 lb

## 2021-01-07 DIAGNOSIS — E039 Hypothyroidism, unspecified: Secondary | ICD-10-CM | POA: Diagnosis not present

## 2021-01-07 DIAGNOSIS — R7303 Prediabetes: Secondary | ICD-10-CM

## 2021-01-07 DIAGNOSIS — I10 Essential (primary) hypertension: Secondary | ICD-10-CM

## 2021-01-07 DIAGNOSIS — N951 Menopausal and female climacteric states: Secondary | ICD-10-CM | POA: Diagnosis not present

## 2021-01-07 DIAGNOSIS — R202 Paresthesia of skin: Secondary | ICD-10-CM | POA: Diagnosis not present

## 2021-01-07 MED ORDER — MELOXICAM 15 MG PO TABS: 15.0000 mg | ORAL_TABLET | Freq: Every day | ORAL | 4 refills | Status: DC

## 2021-01-07 MED ORDER — ESTRADIOL 2 MG PO TABS: 2.0000 mg | ORAL_TABLET | Freq: Every day | ORAL | 3 refills | Status: DC

## 2021-01-07 MED ORDER — PRAVASTATIN SODIUM 20 MG PO TABS: 20.0000 mg | ORAL_TABLET | Freq: Every day | ORAL | 4 refills | Status: DC

## 2021-01-07 MED ORDER — GABAPENTIN 300 MG PO CAPS: ORAL_CAPSULE | ORAL | 1 refills | Status: DC

## 2021-01-07 NOTE — Progress Notes (Signed)
Established patient visit   Patient: Jamie Williams   DOB: May 03, 1961   60 y.o. Female  MRN: [id number] Visit Date: 01/07/2021  Today's healthcare provider: Lelon Huh, MD   Chief Complaint  Patient presents with  . Hypothyroidism  . Hypertension   Subjective    HPI  Hypothyroid, follow-up  Lab Results  Component Value Date   TSH 5.410 (H) 10/06/2020   TSH 5.120 (H) 07/21/2020   TSH 2.790 10/15/2019   Wt Readings from Last 3 Encounters:  01/07/21 185 lb (83.9 kg)  10/06/20 189 lb (85.7 kg)  07/21/20 189 lb (85.7 kg)    She was last seen for hypothyroid 3 months ago.  Management since that visit includes starting levothyroxine 50 mcg once daily. She reports good compliance with treatment. She is not having side effects. She states her energy level has increased since starting levothyroxine. She has more appetite, but her weight is down.   Symptoms: Yes change in energy level No constipation  No diarrhea No heat / cold intolerance  No nervousness No palpitations  No weight changes    -----------------------------------------------------------------------------------------  Hypertension, follow-up  BP Readings from Last 3 Encounters:  01/07/21 (!) 154/89  10/06/20 134/80  07/21/20 (!) 143/90   Wt Readings from Last 3 Encounters:  01/07/21 185 lb (83.9 kg)  10/06/20 189 lb (85.7 kg)  07/21/20 189 lb (85.7 kg)     She was last seen for hypertension 3 months ago.  BP at that visit was 134/80. Management since that visit includes continue same medication.  She reports good compliance with treatment, although she states she forgot to take her bp medications this morning. .She is not having side effects.  She is following a Regular diet. She is not exercising. She does smoke.  Use of agents associated with hypertension: none.   Outside blood pressures are not checked. Symptoms: No chest pain No chest pressure  No palpitations No syncope  No  dyspnea No orthopnea  No paroxysmal nocturnal dyspnea No lower extremity edema   Pertinent labs: Lab Results  Component Value Date   CHOL 210 (H) 07/21/2020   HDL 62 07/21/2020   LDLCALC 132 (H) 07/21/2020   TRIG 89 07/21/2020   CHOLHDL 3.4 07/21/2020   Lab Results  Component Value Date   NA 140 10/06/2020   K 4.1 10/06/2020   CREATININE 0.96 10/06/2020   GFRNONAA 66 10/06/2020   GFRAA 76 10/06/2020   GLUCOSE 117 (H) 10/06/2020     The 10-year ASCVD risk score Mikey Bussing DC Jr., et al., 2013) is: 19.2%   ---------------------------------------------------------------------------------------------------      Medications: Outpatient Medications Prior to Visit  Medication Sig  . albuterol (VENTOLIN HFA) 108 (90 Base) MCG/ACT inhaler Inhale 2 puffs into the lungs every 6 (six) hours as needed for wheezing or shortness of breath.  Marland Kitchen amLODipine (NORVASC) 10 MG tablet Take 1 tablet (10 mg total) by mouth daily.  Marland Kitchen buPROPion (WELLBUTRIN SR) 150 MG 12 hr tablet TAKE 1 TABLET BY MOUTH TWICE DAILY  . cholecalciferol (VITAMIN D) 1000 units tablet Take 2,000 Units by mouth daily.   Marland Kitchen estradiol (ESTRACE) 2 MG tablet Take 1 tablet (2 mg total) by mouth daily.  Marland Kitchen gabapentin (NEURONTIN) 300 MG capsule TAKE 1 CAPSULE BY MOUTH THREE TIMES A DAY  . hydrochlorothiazide (HYDRODIURIL) 25 MG tablet Take 1 tablet (25 mg total) by mouth daily.  Marland Kitchen HYDROcodone-acetaminophen (NORCO) 7.5-325 MG tablet Take 1 tablet by mouth every 6 (  six) hours as needed for moderate pain.  Marland Kitchen levothyroxine (SYNTHROID) 50 MCG tablet Take 1 tablet (50 mcg total) by mouth daily.  . meloxicam (MOBIC) 15 MG tablet TAKE 1 TABLET BY MOUTH EVERY DAY WITH FOOD  . PARoxetine (PAXIL) 30 MG tablet TAKE 2 TABLETS (60 MG TOTAL) BY MOUTH DAILY.  . pravastatin (PRAVACHOL) 20 MG tablet TAKE 1 TABLET BY MOUTH EVERY DAY  . promethazine (PHENERGAN) 25 MG tablet TAKE 1 TABLET BY MOUTH EVERY 4 TO 6 HOURS AS NEEDED FOR NAUSEA  . ranitidine (ZANTAC)  150 MG tablet RANITIDINE HCL, 150MG  (Oral Tablet)  1 Two Times A Day for heartburn, indigestion, nausea for 0 days  Quantity: 60.00;  Refills: 5   Ordered :22-Jul-2010  Reginia Forts MD;  Buddy Duty 28-Jan-2010 Active Comments: DX: 787.02   No facility-administered medications prior to visit.    Review of Systems  Constitutional: Negative for appetite change, chills, fatigue and fever.  Respiratory: Negative for chest tightness and shortness of breath.   Cardiovascular: Negative for chest pain and palpitations.  Gastrointestinal: Negative for abdominal pain, nausea and vomiting.  Neurological: Negative for dizziness and weakness.        Objective    BP (!) 154/89 (BP Location: Left Arm, Patient Position: Sitting, Cuff Size: Large)   Pulse 81   Temp (!) 97 F (36.1 C) (Temporal)   Resp 16   Wt 185 lb (83.9 kg)   BMI 30.79 kg/m      Physical Exam   General appearance: Obese female, cooperative and in no acute distress Head: Normocephalic, without obvious abnormality, atraumatic Respiratory: Respirations even and unlabored, normal respiratory rate Extremities: All extremities are intact.  Skin: Skin color, texture, turgor normal. No rashes seen  Psych: Appropriate mood and affect. Neurologic: Mental status: Alert, oriented to person, place, and time, thought content appropriate.   Assessment & Plan     1. Hypothyroidism, unspecified type Doing well with initiation of levothyroxine.  - TSH - T4, free  2. Pre-diabetes  - Hemoglobin A1c  3. Benign essential HTN Usually well controlled, but has not take medications today.   4. Left hand paresthesia Needs refill  - gabapentin (NEURONTIN) 300 MG capsule; TAKE 1 CAPSULE BY MOUTH THREE TIMES A DAY  Dispense: 270 capsule; Refill: 1  5. Menopausal symptoms Needs refill  - estradiol (ESTRACE) 2 MG tablet; Take 1 tablet (2 mg total) by mouth daily.  Dispense: 90 tablet; Refill: 3  Follow up pending results of labs.        The entirety of the information documented in the History of Present Illness, Review of Systems and Physical Exam were personally obtained by me. Portions of this information were initially documented by the CMA and reviewed by me for thoroughness and accuracy.      Lelon Huh, MD  [facility] [phone] (phone) [phone] (fax)  [place]

## 2021-01-08 ENCOUNTER — Telehealth: Payer: Self-pay

## 2021-01-08 LAB — HEMOGLOBIN A1C
Est. average glucose Bld gHb Est-mCnc: 126 mg/dL
Hgb A1c MFr Bld: 6 % — ABNORMAL HIGH (ref 4.8–5.6)

## 2021-01-08 LAB — T4, FREE: Free T4: 1.74 ng/dL (ref 0.82–1.77)

## 2021-01-08 LAB — TSH: TSH: 2.82 u[IU]/mL (ref 0.450–4.500)

## 2021-01-08 NOTE — Telephone Encounter (Signed)
-----   Message from [name], MD sent at 01/08/2021  7:34 AM EDT ----- Thyroid functions are just right. Continue current dose of levothyroxine. Average sugar is stable at 126, which is pre diabetic. Follow up to check on a1c and thyroid in 6 months.

## 2021-01-08 NOTE — Telephone Encounter (Signed)
I called pt and pt verbalized understanding of information below.  

## 2021-01-13 ENCOUNTER — Other Ambulatory Visit: Payer: Self-pay | Admitting: Family Medicine

## 2021-01-13 DIAGNOSIS — S149XXA Injury of unspecified nerves of neck, initial encounter: Secondary | ICD-10-CM

## 2021-01-13 MED ORDER — HYDROCODONE-ACETAMINOPHEN 7.5-325 MG PO TABS: 1.0000 | ORAL_TABLET | Freq: Four times a day (QID) | ORAL | 0 refills | Status: DC | PRN

## 2021-01-13 NOTE — Telephone Encounter (Signed)
Requested medication (s) are due for refill today:yes  Requested medication (s) are on the active medication list:  yes  Last refill:  12/25/2020  Future visit scheduled: no  Notes to clinic:  this refill cannot be delegated    Requested Prescriptions  Pending Prescriptions Disp Refills   HYDROcodone-acetaminophen (NORCO) 7.5-325 MG tablet 30 tablet 0    Sig: Take 1 tablet by mouth every 6 (six) hours as needed for moderate pain.      Not Delegated - Analgesics:  Opioid Agonist Combinations Failed - 01/13/2021 12:32 PM      Failed - This refill cannot be delegated      Failed - Urine Drug Screen completed in last 360 days      Passed - Valid encounter within last 6 months    Recent Outpatient Visits           6 days ago Hypothyroidism, unspecified type   [facility] [name], MD   3 months ago Benign essential HTN   [facility] [name], MD   5 months ago Benign essential HTN   [facility] [name], MD   9 months ago Benign essential HTN   [facility] [name], MD   1 year ago Benign essential HTN   [facility] [name], MD

## 2021-01-13 NOTE — Telephone Encounter (Signed)
Medication Refill - Medication: Hydrocodone 7.5/325  Has the patient contacted their pharmacy? No. (Agent: If no, request that the patient contact the pharmacy for the refill.) (Agent: If yes, when and what did the pharmacy advise?)  Preferred Pharmacy (with phone number or street name): [facility] at [address] and [address]: Please be advised that RX refills may take up to 3 business days. We ask that you follow-up with your pharmacy.

## 2021-01-28 ENCOUNTER — Other Ambulatory Visit: Payer: Self-pay | Admitting: Family Medicine

## 2021-01-28 DIAGNOSIS — E039 Hypothyroidism, unspecified: Secondary | ICD-10-CM

## 2021-01-29 ENCOUNTER — Other Ambulatory Visit: Payer: Self-pay | Admitting: Family Medicine

## 2021-01-29 DIAGNOSIS — S149XXA Injury of unspecified nerves of neck, initial encounter: Secondary | ICD-10-CM

## 2021-01-29 MED ORDER — HYDROCODONE-ACETAMINOPHEN 7.5-325 MG PO TABS: 1.0000 | ORAL_TABLET | Freq: Four times a day (QID) | ORAL | 0 refills | Status: DC | PRN

## 2021-01-29 NOTE — Telephone Encounter (Signed)
Medication Refill - Medication: HYDROcodone-acetaminophen (NORCO) 7.5-325 MG tablet    Has the patient contacted their pharmacy? No. (Agent: If no, request that the patient contact the pharmacy for the refill.) (Agent: If yes, when and what did the pharmacy advise?)  Preferred Pharmacy (with phone number or street name):  [facility] #[id number] [address], [place] - [address]  [place] [address]  Phone: [phone] Fax: [phone]     Agent: Please be advised that RX refills may take up to 3 business days. We ask that you follow-up with your pharmacy.

## 2021-01-29 NOTE — Telephone Encounter (Signed)
Requested medication (s) are due for refill today: yes  Requested medication (s) are on the active medication list: yes  Last refill: 01/13/2021  Future visit scheduled: no  Notes to clinic:  this refill cannot be delegated    Requested Prescriptions  Pending Prescriptions Disp Refills   HYDROcodone-acetaminophen (NORCO) 7.5-325 MG tablet 30 tablet 0    Sig: Take 1 tablet by mouth every 6 (six) hours as needed for moderate pain.      Not Delegated - Analgesics:  Opioid Agonist Combinations Failed - 01/29/2021  3:06 PM      Failed - This refill cannot be delegated      Failed - Urine Drug Screen completed in last 360 days      Passed - Valid encounter within last 6 months    Recent Outpatient Visits           3 weeks ago Hypothyroidism, unspecified type   [facility] [name], MD   3 months ago Benign essential HTN   [facility] [name], MD   6 months ago Benign essential HTN   [facility] [name], MD   9 months ago Benign essential HTN   [facility] [name], MD   1 year ago Benign essential HTN   [facility] [name], MD

## 2021-02-13 ENCOUNTER — Other Ambulatory Visit: Payer: Self-pay | Admitting: Family Medicine

## 2021-02-13 DIAGNOSIS — S149XXA Injury of unspecified nerves of neck, initial encounter: Secondary | ICD-10-CM

## 2021-02-13 MED ORDER — HYDROCODONE-ACETAMINOPHEN 7.5-325 MG PO TABS: 1.0000 | ORAL_TABLET | Freq: Four times a day (QID) | ORAL | 0 refills | Status: DC | PRN

## 2021-02-13 NOTE — Telephone Encounter (Signed)
Medication Refill - Medication: HYDROcodone-acetaminophen (NORCO) 7.5-325 MG tablet   Pt is completely out   Has the patient contacted their pharmacy? No. (Agent: If no, request that the patient contact the pharmacy for the refill.) (Agent: If yes, when and what did the pharmacy advise?)  Preferred Pharmacy (with phone number or street name):  [facility] #[id number] [address], [place] - [address]  [place] [address]  Phone: [phone] Fax: [phone]    Agent: Please be advised that RX refills may take up to 3 business days. We ask that you follow-up with your pharmacy.

## 2021-02-13 NOTE — Telephone Encounter (Signed)
Requested medication (s) are due for refill today: due 02/28/21  Requested medication (s) are on the active medication list: yes  Last refill:  01/29/21 #30 0 refills  Future visit scheduled: no  Notes to clinic:  not delegated per protocol, patient requesting refill     Requested Prescriptions  Pending Prescriptions Disp Refills   HYDROcodone-acetaminophen (NORCO) 7.5-325 MG tablet 30 tablet 0    Sig: Take 1 tablet by mouth every 6 (six) hours as needed for moderate pain.      Not Delegated - Analgesics:  Opioid Agonist Combinations Failed - 02/13/2021 12:24 PM      Failed - This refill cannot be delegated      Failed - Urine Drug Screen completed in last 360 days      Passed - Valid encounter within last 6 months    Recent Outpatient Visits           1 month ago Hypothyroidism, unspecified type   [facility] [name], MD   4 months ago Benign essential HTN   [facility] [name], MD   6 months ago Benign essential HTN   [facility] [name], MD   10 months ago Benign essential HTN   [facility] [name], MD   1 year ago Benign essential HTN   [facility] [name], MD

## 2021-02-27 ENCOUNTER — Other Ambulatory Visit: Payer: Self-pay | Admitting: Family Medicine

## 2021-02-27 DIAGNOSIS — S149XXA Injury of unspecified nerves of neck, initial encounter: Secondary | ICD-10-CM

## 2021-02-27 MED ORDER — HYDROCODONE-ACETAMINOPHEN 7.5-325 MG PO TABS: 1.0000 | ORAL_TABLET | Freq: Four times a day (QID) | ORAL | 0 refills | Status: DC | PRN

## 2021-02-27 NOTE — Telephone Encounter (Signed)
Requested medication (s) are due for refill today: yes  Requested medication (s) are on the active medication list:  yes   Last refill:  02/13/2021  Future visit scheduled: yes  Notes to clinic:  this refill cannot be delegated    Requested Prescriptions  Pending Prescriptions Disp Refills   HYDROcodone-acetaminophen (NORCO) 7.5-325 MG tablet 30 tablet 0    Sig: Take 1 tablet by mouth every 6 (six) hours as needed for moderate pain.      There is no refill protocol information for this order

## 2021-02-27 NOTE — Telephone Encounter (Signed)
Medication Refill - Medication: Hydrocodone 7.5    Has the patient contacted their pharmacy? Yes.  they told her to call the office (Agent: If no, request that the patient contact the pharmacy for the refill.) (Agent: If yes, when and what did the pharmacy advise?)  Preferred Pharmacy (with phone number or street name): [place] [address]  Agent: Please be advised that RX refills may take up to 3 business days. We ask that you follow-up with your pharmacy.

## 2021-03-13 ENCOUNTER — Other Ambulatory Visit: Payer: Self-pay | Admitting: Family Medicine

## 2021-03-13 DIAGNOSIS — S149XXA Injury of unspecified nerves of neck, initial encounter: Secondary | ICD-10-CM

## 2021-03-13 MED ORDER — HYDROCODONE-ACETAMINOPHEN 7.5-325 MG PO TABS: 1.0000 | ORAL_TABLET | Freq: Four times a day (QID) | ORAL | 0 refills | Status: DC | PRN

## 2021-03-13 NOTE — Telephone Encounter (Signed)
Copied from [place] [id number]. Topic: Quick Communication - Rx Refill/Question >> Mar 13, 2021 10:03 AM Tessa Lerner A wrote: Medication: HYDROcodone-acetaminophen (NORCO) 7.5-325 MG tablet   Has the patient contacted their pharmacy? No. Patient has not due to the medication's classification   Preferred Pharmacy (with phone number or street name): [facility] #[id number] [address], [place]  Phone:  [phone] Fax:  [phone]  Agent: Please be advised that RX refills may take up to 3 business days. We ask that you follow-up with your pharmacy.

## 2021-03-13 NOTE — Telephone Encounter (Signed)
Requested medication (s) are due for refill today: Yes  Requested medication (s) are on the active medication list: Yes  Last refill:  02/27/21  Future visit scheduled: Yes  Notes to clinic: Unable to refill per protocol, cannot delgate.      Requested Prescriptions  Pending Prescriptions Disp Refills   HYDROcodone-acetaminophen (NORCO) 7.5-325 MG tablet 30 tablet 0    Sig: Take 1 tablet by mouth every 6 (six) hours as needed for moderate pain.      Not Delegated - Analgesics:  Opioid Agonist Combinations Failed - 03/13/2021 11:17 AM      Failed - This refill cannot be delegated      Failed - Urine Drug Screen completed in last 360 days      Passed - Valid encounter within last 6 months    Recent Outpatient Visits           2 months ago Hypothyroidism, unspecified type   [facility] [name], MD   5 months ago Benign essential HTN   [facility] [name], MD   7 months ago Benign essential HTN   [facility] [name], [facility]   11 months ago Benign essential HTN   [facility] [name], MD   1 year ago Benign essential HTN   [facility] [name], MD       Future Appointments             In 3 months Fisher, Kirstie Peri, MD [facility], [facility]

## 2021-03-17 ENCOUNTER — Other Ambulatory Visit: Payer: Self-pay | Admitting: Family Medicine

## 2021-03-27 ENCOUNTER — Telehealth: Payer: Self-pay | Admitting: Family Medicine

## 2021-03-27 DIAGNOSIS — S149XXA Injury of unspecified nerves of neck, initial encounter: Secondary | ICD-10-CM

## 2021-03-27 MED ORDER — HYDROCODONE-ACETAMINOPHEN 7.5-325 MG PO TABS: 1.0000 | ORAL_TABLET | Freq: Four times a day (QID) | ORAL | 0 refills | Status: DC | PRN

## 2021-03-27 NOTE — Telephone Encounter (Signed)
Medication Refill - Medication: HYDROcodone-acetaminophen (NORCO) 7.5-325 MG tablet    Has the patient contacted their pharmacy? No, pt states she has to call in, due to it being a controlled substance  Preferred Pharmacy (with phone number or street name):  [facility] [place], [place] - [place] Phone:  [phone]  Fax:  [phone]       Agent: Please be advised that RX refills may take up to 3 business days. We ask that you follow-up with your pharmacy.

## 2021-04-10 ENCOUNTER — Other Ambulatory Visit: Payer: Self-pay | Admitting: Family Medicine

## 2021-04-10 DIAGNOSIS — S149XXA Injury of unspecified nerves of neck, initial encounter: Secondary | ICD-10-CM

## 2021-04-10 MED ORDER — HYDROCODONE-ACETAMINOPHEN 7.5-325 MG PO TABS: 1.0000 | ORAL_TABLET | Freq: Four times a day (QID) | ORAL | 0 refills | Status: DC | PRN

## 2021-04-10 NOTE — Telephone Encounter (Signed)
Requested medication (s) are due for refill today - unsure  Requested medication (s) are on the active medication list -yes  Future visit scheduled -yes  Last refill: 03/27/21 #30  Notes to clinic: Request Rf- non delegated Rx  Requested Prescriptions  Pending Prescriptions Disp Refills   HYDROcodone-acetaminophen (NORCO) 7.5-325 MG tablet 30 tablet 0    Sig: Take 1 tablet by mouth every 6 (six) hours as needed for moderate pain.      Not Delegated - Analgesics:  Opioid Agonist Combinations Failed - 04/10/2021  9:59 AM      Failed - This refill cannot be delegated      Failed - Urine Drug Screen completed in last 360 days      Passed - Valid encounter within last 6 months    Recent Outpatient Visits           3 months ago Hypothyroidism, unspecified type   [facility] [name], MD   6 months ago Benign essential HTN   [facility] [name], MD   8 months ago Benign essential HTN   [facility] [name], MD   12 months ago Benign essential HTN   [facility] [name], MD   1 year ago Benign essential HTN   [facility] [name], MD       Future Appointments             In 2 months Fisher, Kirstie Peri, MD [facility]                 Requested Prescriptions  Pending Prescriptions Disp Refills   HYDROcodone-acetaminophen (NORCO) 7.5-325 MG tablet 30 tablet 0    Sig: Take 1 tablet by mouth every 6 (six) hours as needed for moderate pain.      Not Delegated - Analgesics:  Opioid Agonist Combinations Failed - 04/10/2021  9:59 AM      Failed - This refill cannot be delegated      Failed - Urine Drug Screen completed in last 360 days      Passed - Valid encounter within last 6 months    Recent Outpatient Visits           3 months ago Hypothyroidism, unspecified type   [facility] [name], MD   6 months ago  Benign essential HTN   [facility] [name], MD   8 months ago Benign essential HTN   [facility] [name], MD   12 months ago Benign essential HTN   [facility] [name], MD   1 year ago Benign essential HTN   [facility] [name], MD       Future Appointments             In 2 months Fisher, Kirstie Peri, MD [facility], [place]

## 2021-04-10 NOTE — Telephone Encounter (Signed)
Medication Refill - Medication: Hydrocodone 7.5/325  Has the patient contacted their pharmacy? No. (Agent: If no, request that the patient contact the pharmacy for the refill.) (Agent: If yes, when and what did the pharmacy advise?)  Preferred Pharmacy (with phone number or street name): [facility]. [address]: Please be advised that RX refills may take up to 3 business days. We ask that you follow-up with your pharmacy.

## 2021-04-24 ENCOUNTER — Other Ambulatory Visit: Payer: Self-pay | Admitting: Family Medicine

## 2021-04-24 DIAGNOSIS — S149XXA Injury of unspecified nerves of neck, initial encounter: Secondary | ICD-10-CM

## 2021-04-24 MED ORDER — HYDROCODONE-ACETAMINOPHEN 7.5-325 MG PO TABS: 1.0000 | ORAL_TABLET | Freq: Four times a day (QID) | ORAL | 0 refills | Status: DC | PRN

## 2021-04-24 NOTE — Telephone Encounter (Signed)
Requested medication (s) are due for refill today unsure  Requested medication (s) are on the active medication list -yes  Future visit scheduled - yes  Last refill: 2 weeks  Notes to clinic: Request RF- non delegated Rx  Requested Prescriptions  Pending Prescriptions Disp Refills   HYDROcodone-acetaminophen (NORCO) 7.5-325 MG tablet 30 tablet 0    Sig: Take 1 tablet by mouth every 6 (six) hours as needed for moderate pain.      Not Delegated - Analgesics:  Opioid Agonist Combinations Failed - 04/24/2021 12:31 PM      Failed - This refill cannot be delegated      Failed - Urine Drug Screen completed in last 360 days      Passed - Valid encounter within last 6 months    Recent Outpatient Visits           3 months ago Hypothyroidism, unspecified type   [facility] [name], MD   6 months ago Benign essential HTN   [facility] [name], MD   9 months ago Benign essential HTN   [facility] [name], MD   1 year ago Benign essential HTN   [facility] [name], MD   1 year ago Benign essential HTN   [facility] [name], MD       Future Appointments             In 2 months Fisher, Kirstie Peri, MD [facility]                 Requested Prescriptions  Pending Prescriptions Disp Refills   HYDROcodone-acetaminophen (NORCO) 7.5-325 MG tablet 30 tablet 0    Sig: Take 1 tablet by mouth every 6 (six) hours as needed for moderate pain.      Not Delegated - Analgesics:  Opioid Agonist Combinations Failed - 04/24/2021 12:31 PM      Failed - This refill cannot be delegated      Failed - Urine Drug Screen completed in last 360 days      Passed - Valid encounter within last 6 months    Recent Outpatient Visits           3 months ago Hypothyroidism, unspecified type   [facility] [name], MD   6 months ago Benign  essential HTN   [facility] [name], MD   9 months ago Benign essential HTN   [facility] [name], MD   1 year ago Benign essential HTN   [facility] [name], MD   1 year ago Benign essential HTN   [facility] [name], MD       Future Appointments             In 2 months Fisher, Kirstie Peri, MD [facility], [place]

## 2021-04-24 NOTE — Telephone Encounter (Signed)
Medication Refill - Medication:  HYDROcodone-acetaminophen (NORCO) 7.5-325 MG tablet  Has the patient contacted their pharmacy? Yes.    (Agent: If yes, when and what did the pharmacy advise?) Contact PCP office  Preferred Pharmacy (with phone number or street name):  [facility] #[id number] [address], [place] Phone:  [phone]  Fax:  [phone]      Agent: Please be advised that RX refills may take up to 3 business days. We ask that you follow-up with your pharmacy.

## 2021-04-28 ENCOUNTER — Other Ambulatory Visit: Payer: Self-pay | Admitting: Family Medicine

## 2021-04-28 DIAGNOSIS — I10 Essential (primary) hypertension: Secondary | ICD-10-CM

## 2021-05-08 ENCOUNTER — Other Ambulatory Visit: Payer: Self-pay | Admitting: Family Medicine

## 2021-05-08 DIAGNOSIS — S149XXA Injury of unspecified nerves of neck, initial encounter: Secondary | ICD-10-CM

## 2021-05-08 MED ORDER — HYDROCODONE-ACETAMINOPHEN 7.5-325 MG PO TABS: 1.0000 | ORAL_TABLET | Freq: Four times a day (QID) | ORAL | 0 refills | Status: DC | PRN

## 2021-05-08 NOTE — Telephone Encounter (Signed)
Requested medication (s) are due for refill today:  yes   Requested medication (s) are on the active medication list: yes   Last refill:  04/24/2021  Future visit scheduled: yes  Notes to clinic: this refill cannot be delegated    Requested Prescriptions  Pending Prescriptions Disp Refills   HYDROcodone-acetaminophen (NORCO) 7.5-325 MG tablet 30 tablet 0    Sig: Take 1 tablet by mouth every 6 (six) hours as needed for moderate pain.      Not Delegated - Analgesics:  Opioid Agonist Combinations Failed - 05/08/2021  9:42 AM      Failed - This refill cannot be delegated      Failed - Urine Drug Screen completed in last 360 days      Passed - Valid encounter within last 6 months    Recent Outpatient Visits           4 months ago Hypothyroidism, unspecified type   [facility] [name], MD   7 months ago Benign essential HTN   [facility] [name], MD   9 months ago Benign essential HTN   [facility] [name], MD   1 year ago Benign essential HTN   [facility] [name], MD   1 year ago Benign essential HTN   [facility] [name], MD       Future Appointments             In 1 month Fisher, Kirstie Peri, MD [facility], [place]

## 2021-05-08 NOTE — Telephone Encounter (Signed)
Medication Refill - Medication: HYDROcodone-acetaminophen (NORCO) 7.5-325 MG tablet  Has the patient contacted their pharmacy? Yes.    (Agent: If yes, when and what did the pharmacy advise?) contact PCP office   Preferred Pharmacy (with phone number or street name):  [facility] #[id number] [address], [place] Phone:  [phone]  Fax:  [phone]      Agent: Please be advised that RX refills may take up to 3 business days. We ask that you follow-up with your pharmacy.

## 2021-05-22 ENCOUNTER — Other Ambulatory Visit: Payer: Self-pay | Admitting: Family Medicine

## 2021-05-22 DIAGNOSIS — S149XXA Injury of unspecified nerves of neck, initial encounter: Secondary | ICD-10-CM

## 2021-05-22 MED ORDER — HYDROCODONE-ACETAMINOPHEN 7.5-325 MG PO TABS: 1.0000 | ORAL_TABLET | Freq: Four times a day (QID) | ORAL | 0 refills | Status: DC | PRN

## 2021-05-22 NOTE — Telephone Encounter (Signed)
Requested medication (s) are due for refill today: yes  Requested medication (s) are on the active medication list: yes   Last refill:  05/08/2021  Future visit scheduled: yes  Notes to clinic:  This refill cannot be delegated   Requested Prescriptions  Pending Prescriptions Disp Refills   HYDROcodone-acetaminophen (NORCO) 7.5-325 MG tablet 30 tablet 0    Sig: Take 1 tablet by mouth every 6 (six) hours as needed for moderate pain.      Not Delegated - Analgesics:  Opioid Agonist Combinations Failed - 05/22/2021 10:21 AM      Failed - This refill cannot be delegated      Failed - Urine Drug Screen completed in last 360 days      Passed - Valid encounter within last 6 months    Recent Outpatient Visits           4 months ago Hypothyroidism, unspecified type   [facility] [name], MD   7 months ago Benign essential HTN   [facility] [name], MD   10 months ago Benign essential HTN   [facility] [name], MD   1 year ago Benign essential HTN   [facility] [name], MD   1 year ago Benign essential HTN   [facility] [name], MD       Future Appointments             In 1 month Fisher, Kirstie Peri, MD [facility], [place]

## 2021-05-22 NOTE — Telephone Encounter (Signed)
Copied from [place] [phone] [id number]. Topic: Quick Communication - Rx Refill/Question >> May 22, 2021 10:18 AM Leward Quan A wrote: Medication: HYDROcodone-acetaminophen ([place]) 7.5-325 MG tablet   Has the patient contacted their pharmacy? No. (Agent: If no, request that the patient contact the pharmacy for the refill.) (Agent: If yes, when and what did the pharmacy advise?)  Preferred Pharmacy (with phone number or street name): [facility] DRUG STORE [id number], [place]  Phone:  [phone] Fax:  [phone]     Agent: Please be advised that RX refills may take up to 3 business days. We ask that you follow-up with your pharmacy.

## 2021-06-03 ENCOUNTER — Other Ambulatory Visit: Payer: Self-pay | Admitting: Family Medicine

## 2021-06-03 DIAGNOSIS — E039 Hypothyroidism, unspecified: Secondary | ICD-10-CM

## 2021-06-03 NOTE — Telephone Encounter (Signed)
Requested Prescriptions  Pending Prescriptions Disp Refills  . levothyroxine (SYNTHROID) 50 MCG tablet [Pharmacy Med Name: LEVOTHYROXINE 0.'05MG'$  (50MCG) TAB] 90 tablet 0    Sig: TAKE 1 TABLET(50 MCG) BY MOUTH DAILY     Endocrinology:  Hypothyroid Agents Failed - 06/03/2021  6:22 AM      Failed - TSH needs to be rechecked within 3 months after an abnormal result. Refill until TSH is due.      Passed - TSH in normal range and within 360 days    TSH  Date Value Ref Range Status  01/07/2021 2.820 0.450 - 4.500 uIU/mL Final         Passed - Valid encounter within last 12 months    Recent Outpatient Visits          4 months ago Hypothyroidism, unspecified type   [facility] [name], MD   8 months ago Benign essential HTN   [facility] [name], MD   10 months ago Benign essential HTN   [facility] [name], MD   1 year ago Benign essential HTN   [facility] [name], MD   1 year ago Benign essential HTN   [facility] [name], MD      Future Appointments            In 1 month Fisher, Kirstie Peri, MD [facility], [place]

## 2021-06-05 ENCOUNTER — Other Ambulatory Visit: Payer: Self-pay | Admitting: Family Medicine

## 2021-06-05 DIAGNOSIS — S149XXA Injury of unspecified nerves of neck, initial encounter: Secondary | ICD-10-CM

## 2021-06-05 MED ORDER — HYDROCODONE-ACETAMINOPHEN 7.5-325 MG PO TABS: 1.0000 | ORAL_TABLET | Freq: Four times a day (QID) | ORAL | 0 refills | Status: DC | PRN

## 2021-06-05 NOTE — Telephone Encounter (Signed)
Copied from [place] [phone]. Topic: Quick Communication - Rx Refill/Question >> Jun 05, 2021  9:15 AM Tessa Lerner A wrote: Medication: HYDROcodone-acetaminophen (NORCO) 7.5-325 MG tablet   Has the patient contacted their pharmacy? No. (Agent: If no, request that the patient contact the pharmacy for the refill.) (Agent: If yes, when and what did the pharmacy advise?)  Preferred Pharmacy (with phone number or street name): [facility] DRUG STORE [id number], [place]  Phone:  [phone] Fax:  [phone]   Agent: Please be advised that RX refills may take up to 3 business days. We ask that you follow-up with your pharmacy.

## 2021-06-05 NOTE — Telephone Encounter (Signed)
Requested medication (s) are due for refill today: no  Requested medication (s) are on the active medication list: yess  Last refill:  05/22/2021  Future visit scheduled: yes  Notes to clinic this refill cannot be delegated    Requested Prescriptions  Pending Prescriptions Disp Refills   HYDROcodone-acetaminophen (NORCO) 7.5-325 MG tablet 30 tablet 0    Sig: Take 1 tablet by mouth every 6 (six) hours as needed for moderate pain.     Not Delegated - Analgesics:  Opioid Agonist Combinations Failed - 06/05/2021  9:20 AM      Failed - This refill cannot be delegated      Failed - Urine Drug Screen completed in last 360 days      Passed - Valid encounter within last 6 months    Recent Outpatient Visits           4 months ago Hypothyroidism, unspecified type   [facility] [name], MD   8 months ago Benign essential HTN   [facility] [name], MD   10 months ago Benign essential HTN   [facility] [name], MD   1 year ago Benign essential HTN   [facility] [name], MD   1 year ago Benign essential HTN   [facility] [name], MD       Future Appointments             In 4 weeks Fisher, Kirstie Peri, MD [facility], [place]

## 2021-06-05 NOTE — Telephone Encounter (Signed)
Please review. Thanks!  

## 2021-06-19 ENCOUNTER — Other Ambulatory Visit: Payer: Self-pay | Admitting: Family Medicine

## 2021-06-19 DIAGNOSIS — S149XXA Injury of unspecified nerves of neck, initial encounter: Secondary | ICD-10-CM

## 2021-06-19 NOTE — Telephone Encounter (Signed)
Please review for refill. Refill not delegated per protocol.  Last refill: 06/05/21 Next OV: 07/03/21

## 2021-06-19 NOTE — Telephone Encounter (Signed)
Copied from [place] [phone]. Topic: Quick Communication - Rx Refill/Question >> Jun 19, 2021  9:28 AM Yvette Rack wrote: Medication: HYDROcodone-acetaminophen ([place]) 7.5-325 MG tablet  Has the patient contacted their pharmacy? No. (Agent: If no, request that the patient contact the pharmacy for the refill.) (Agent: If yes, when and what did the pharmacy advise?)  Preferred Pharmacy (with phone number or street name): [facility] DR[facility] STORE [id number], [place] Phone: [phone]   Fax: [phone]  Agent: Please be advised that RX refills may take up to 3 business days. We ask that you follow-up with your pharmacy.

## 2021-06-21 MED ORDER — HYDROCODONE-ACETAMINOPHEN 7.5-325 MG PO TABS: 1.0000 | ORAL_TABLET | Freq: Four times a day (QID) | ORAL | 0 refills | Status: DC | PRN

## 2021-07-03 ENCOUNTER — Other Ambulatory Visit: Payer: Self-pay

## 2021-07-03 ENCOUNTER — Ambulatory Visit (INDEPENDENT_AMBULATORY_CARE_PROVIDER_SITE_OTHER): Payer: BC Managed Care – PPO | Admitting: Family Medicine

## 2021-07-03 ENCOUNTER — Encounter: Payer: Self-pay | Admitting: Family Medicine

## 2021-07-03 VITALS — BP 139/77 | HR 85 | Temp 98.6°F | Wt 180.0 lb

## 2021-07-03 DIAGNOSIS — S149XXA Injury of unspecified nerves of neck, initial encounter: Secondary | ICD-10-CM

## 2021-07-03 DIAGNOSIS — N951 Menopausal and female climacteric states: Secondary | ICD-10-CM

## 2021-07-03 DIAGNOSIS — M4722 Other spondylosis with radiculopathy, cervical region: Secondary | ICD-10-CM

## 2021-07-03 DIAGNOSIS — E559 Vitamin D deficiency, unspecified: Secondary | ICD-10-CM | POA: Diagnosis not present

## 2021-07-03 DIAGNOSIS — E039 Hypothyroidism, unspecified: Secondary | ICD-10-CM

## 2021-07-03 DIAGNOSIS — E7800 Pure hypercholesterolemia, unspecified: Secondary | ICD-10-CM

## 2021-07-03 DIAGNOSIS — R202 Paresthesia of skin: Secondary | ICD-10-CM | POA: Diagnosis not present

## 2021-07-03 DIAGNOSIS — F4320 Adjustment disorder, unspecified: Secondary | ICD-10-CM

## 2021-07-03 DIAGNOSIS — I10 Essential (primary) hypertension: Secondary | ICD-10-CM

## 2021-07-03 DIAGNOSIS — F32A Depression, unspecified: Secondary | ICD-10-CM | POA: Diagnosis not present

## 2021-07-03 DIAGNOSIS — R7303 Prediabetes: Secondary | ICD-10-CM | POA: Diagnosis not present

## 2021-07-03 DIAGNOSIS — Z23 Encounter for immunization: Secondary | ICD-10-CM

## 2021-07-03 DIAGNOSIS — G629 Polyneuropathy, unspecified: Secondary | ICD-10-CM | POA: Diagnosis not present

## 2021-07-03 MED ORDER — PAROXETINE HCL 30 MG PO TABS: 60.0000 mg | ORAL_TABLET | Freq: Every day | ORAL | 1 refills | Status: DC

## 2021-07-03 MED ORDER — GABAPENTIN 300 MG PO CAPS: ORAL_CAPSULE | ORAL | 1 refills | Status: DC

## 2021-07-03 MED ORDER — HYDROCODONE-ACETAMINOPHEN 7.5-325 MG PO TABS: 1.0000 | ORAL_TABLET | Freq: Four times a day (QID) | ORAL | 0 refills | Status: DC | PRN

## 2021-07-03 MED ORDER — BUPROPION HCL ER (SR) 150 MG PO TB12: 150.0000 mg | ORAL_TABLET | Freq: Two times a day (BID) | ORAL | 1 refills | Status: DC

## 2021-07-03 NOTE — Patient Instructions (Signed)
Please review the attached list of medications and notify my office if there are any errors.   It is recommended to engage in 150 minutes of moderate exercise every week.

## 2021-07-03 NOTE — Progress Notes (Signed)
Established patient visit   Patient: Jamie Williams   DOB: [birth year]   60 y.o. Female  MRN: [id number] Visit Date: 07/03/2021  Today's healthcare provider: Lelon Huh, MD   Chief Complaint  Patient presents with   Hypothyroidism   Hypertension   Hyperglycemia   Subjective    HPI  Hypothyroid, follow-up  Lab Results  Component Value Date   TSH 2.820 01/07/2021   TSH 5.410 (H) 10/06/2020   TSH 5.120 (H) 07/21/2020   FREET4 1.74 01/07/2021   Wt Readings from Last 3 Encounters:  07/03/21 180 lb (81.6 kg)  01/07/21 185 lb (83.9 kg)  10/06/20 189 lb (85.7 kg)    She was last seen for hypothyroid 6 months ago.  Management since that visit includes no changes. She reports excellent compliance with treatment. She is not having side effects.   Symptoms: No change in energy level No constipation  No diarrhea No heat / cold intolerance  No nervousness No palpitations  No weight changes    -----------------------------------------------------------------------------------------   Prediabetes, Follow-up  Lab Results  Component Value Date   HGBA1C 6.0 (H) 01/07/2021   HGBA1C 6.1 (H) 07/21/2020   HGBA1C 6.0 (H) 07/13/2019   GLUCOSE 117 (H) 10/06/2020   GLUCOSE 112 (H) 07/21/2020   GLUCOSE 97 10/15/2019    Last seen for for this6 months ago.  Management since that visit includes no changes. Current symptoms include none and have been stable.  Current diet: in general, a "healthy" diet      Wt Readings from Last 3 Encounters:  07/03/21 180 lb (81.6 kg)  01/07/21 185 lb (83.9 kg)  10/06/20 189 lb (85.7 kg)    ----------------------------------------------------------------------------------------- Hypertension, follow-up  BP Readings from Last 3 Encounters:  07/03/21 139/77  01/07/21 (!) 154/89  10/06/20 134/80   Wt Readings from Last 3 Encounters:  07/03/21 180 lb (81.6 kg)  01/07/21 185 lb (83.9 kg)  10/06/20 189 lb (85.7 kg)     She was  last seen for hypertension 6 months ago.  BP at that visit was 154/89. Management since that visit includes no changes.  She reports excellent compliance with treatment. She is not having side effects.  She is following a Low Sodium diet. She is exercising. She does smoke.  Use of agents associated with hypertension: none.   Outside blood pressures are normal at home. Symptoms: No chest pain No chest pressure  No palpitations No syncope  No dyspnea No orthopnea  No paroxysmal nocturnal dyspnea No lower extremity edema   Pertinent labs: Lab Results  Component Value Date   CHOL 210 (H) 07/21/2020   HDL 62 07/21/2020   LDLCALC 132 (H) 07/21/2020   TRIG 89 07/21/2020   CHOLHDL 3.4 07/21/2020   Lab Results  Component Value Date   NA 140 10/06/2020   K 4.1 10/06/2020   CREATININE 0.96 10/06/2020   GFRNONAA 66 10/06/2020   GFRAA 76 10/06/2020   GLUCOSE 117 (H) 10/06/2020     The 10-year ASCVD risk score (Arnett DK, et al., 2019) is: 14.2%   ---------------------------------------------------------------------------------------------------  She also reports that she continues to have hot flashes mainly at night. They are better since increasing dose of estradiol which she wishes to continue.   She continues to have pain from neuropathy and cervical disk disease every day, but current dose of gabapentin and hydrocodone/apap remain very effective.  Medications: Outpatient Medications Prior to Visit  Medication Sig   albuterol (VENTOLIN HFA) 108 (90  Base) MCG/ACT inhaler Inhale 2 puffs into the lungs every 6 (six) hours as needed for wheezing or shortness of breath.   amLODipine (NORVASC) 10 MG tablet Take 1 tablet (10 mg total) by mouth daily.   buPROPion (WELLBUTRIN SR) 150 MG 12 hr tablet TAKE 1 TABLET BY MOUTH TWICE DAILY   cholecalciferol (VITAMIN D) 1000 units tablet Take 2,000 Units by mouth daily.    estradiol (ESTRACE) 2 MG tablet Take 1 tablet (2 mg total) by mouth  daily.   gabapentin (NEURONTIN) 300 MG capsule TAKE 1 CAPSULE BY MOUTH THREE TIMES A DAY   hydrochlorothiazide (HYDRODIURIL) 25 MG tablet Take 1 tablet (25 mg total) by mouth daily.   HYDROcodone-acetaminophen (NORCO) 7.5-325 MG tablet Take 1 tablet by mouth every 6 (six) hours as needed for moderate pain.   levothyroxine (SYNTHROID) 50 MCG tablet TAKE 1 TABLET(50 MCG) BY MOUTH DAILY   meloxicam (MOBIC) 15 MG tablet Take 1 tablet (15 mg total) by mouth daily. with food   PARoxetine (PAXIL) 30 MG tablet TAKE 2 TABLETS (60 MG TOTAL) BY MOUTH DAILY.   pravastatin (PRAVACHOL) 20 MG tablet Take 1 tablet (20 mg total) by mouth daily.   promethazine (PHENERGAN) 25 MG tablet TAKE 1 TABLET BY MOUTH EVERY 4 TO 6 HOURS AS NEEDED FOR NAUSEA   ranitidine (ZANTAC) 150 MG tablet RANITIDINE HCL, '150MG'$  (Oral Tablet)  1 Two Times A Day for heartburn, indigestion, nausea for 0 days  Quantity: 60.00;  Refills: 5   Ordered :22-Jul-2010  Reginia Forts MD;  Buddy Duty 28-Jan-2010 Active Comments: DX: 787.02   No facility-administered medications prior to visit.    Review of Systems  Constitutional: Negative.   Respiratory: Negative.    Cardiovascular: Negative.   Gastrointestinal: Negative.   Endocrine: Positive for heat intolerance and polydipsia. Negative for cold intolerance, polyphagia and polyuria.  Neurological:  Negative for dizziness, light-headedness, numbness and headaches.      Objective    BP 139/77 (BP Location: Left Arm, Patient Position: Sitting, Cuff Size: Large)   Pulse 85   Temp 98.6 F (37 C) (Oral)   Wt 180 lb (81.6 kg)   BMI 29.95 kg/m    Physical Exam   General: Appearance:     Overweight female in no acute distress  Eyes:    PERRL, conjunctiva/corneas clear, EOM's intact       Lungs:     Clear to auscultation bilaterally, respirations unlabored  Heart:    Normal heart rate. Normal rhythm. No murmurs, rubs, or gallops.    MS:   All extremities are intact.    Neurologic:    Awake, alert, oriented x 3. No apparent focal neurological defect.         Assessment & Plan     1. Hypothyroidism, unspecified type  - TSH  2. Depression, unspecified depression type Was exacerbated when her 58 yo dog passed away, but is doing better now. refill buPROPion (WELLBUTRIN SR) 150 MG 12 hr tablet; Take 1 tablet (150 mg total) by mouth 2 (two) times daily.  Dispense: 120 tablet; Refill: 1  Has been taking paroxetine intermittently, but is now out refill PARoxetine (PAXIL) 30 MG tablet; Take 2 tablets (60 mg total) by mouth daily.  Dispense: 180 tablet; Refill: 1  3. Pre-diabetes  - Hemoglobin A1c  4. Hypercholesterolemia without hypertriglyceridemia She is tolerating pravastatin well with no adverse effects.   - Comprehensive metabolic panel - Lipid panel  5. Vitamin D deficiency  - VITAMIN D 25 Hydroxy (  Vit-D Deficiency, Fractures)  6. Menopausal symptoms Fairly well controlled on '2mg'$  estradiole. Encouraged to try weaning to QOD every once in awhile  7. Adjustment disorder, unspecified type Continue bupropion and paroxetine.   8. Benign essential HTN Well controlled.  Continue current medications.    9. Cervical spondylosis with radiculopathy Continue gabapentin and hydrocodone/apap   10. Compression injury of cervical spinal nerve refill- HYDROcodone-acetaminophen (NORCO) 7.5-325 MG tablet; Take 1 tablet by mouth every 6 (six) hours as needed for moderate pain.  Dispense: 30 tablet; Refill: 0  11. Neuropathy   12. Left hand paresthesia  - gabapentin (NEURONTIN) 300 MG capsule; TAKE 1 CAPSULE BY MOUTH THREE TIMES A DAY  Dispense: 270 capsule; Refill: 1  13. Need for influenza vaccination She declined flu vaccine today.         The entirety of the information documented in the History of Present Illness, Review of Systems and Physical Exam were personally obtained by me. Portions of this information were initially documented by the CMA and reviewed  by me for thoroughness and accuracy.     Lelon Huh, MD  [facility] [phone] (phone) [phone] (fax)  [place]

## 2021-07-04 LAB — COMPREHENSIVE METABOLIC PANEL
ALT: 17 IU/L (ref 0–32)
AST: 16 IU/L (ref 0–40)
Albumin/Globulin Ratio: 2.2 (ref 1.2–2.2)
Albumin: 4.9 g/dL (ref 3.8–4.9)
Alkaline Phosphatase: 148 IU/L — ABNORMAL HIGH (ref 44–121)
BUN/Creatinine Ratio: 12 (ref 9–23)
BUN: 11 mg/dL (ref 6–24)
Bilirubin Total: 0.7 mg/dL (ref 0.0–1.2)
CO2: 22 mmol/L (ref 20–29)
Calcium: 10.6 mg/dL — ABNORMAL HIGH (ref 8.7–10.2)
Chloride: 102 mmol/L (ref 96–106)
Creatinine, Ser: 0.95 mg/dL (ref 0.57–1.00)
Globulin, Total: 2.2 g/dL (ref 1.5–4.5)
Glucose: 118 mg/dL — ABNORMAL HIGH (ref 65–99)
Potassium: 4.7 mmol/L (ref 3.5–5.2)
Sodium: 142 mmol/L (ref 134–144)
Total Protein: 7.1 g/dL (ref 6.0–8.5)
eGFR: 69 mL/min/{1.73_m2} (ref >59)

## 2021-07-04 LAB — TSH: TSH: 3.59 u[IU]/mL (ref 0.450–4.500)

## 2021-07-04 LAB — VITAMIN D 25 HYDROXY (VIT D DEFICIENCY, FRACTURES): Vit D, 25-Hydroxy: 38.2 ng/mL (ref 30.0–100.0)

## 2021-07-04 LAB — LIPID PANEL
Chol/HDL Ratio: 3.7 ratio (ref 0.0–4.4)
Cholesterol, Total: 245 mg/dL — ABNORMAL HIGH (ref 100–199)
HDL: 66 mg/dL (ref >39)
LDL Chol Calc (NIH): 163 mg/dL — ABNORMAL HIGH (ref 0–99)
Triglycerides: 92 mg/dL (ref 0–149)
VLDL Cholesterol Cal: 16 mg/dL (ref 5–40)

## 2021-07-04 LAB — HEMOGLOBIN A1C
Est. average glucose Bld gHb Est-mCnc: 128 mg/dL
Hgb A1c MFr Bld: 6.1 % — ABNORMAL HIGH (ref 4.8–5.6)

## 2021-07-08 ENCOUNTER — Telehealth: Payer: Self-pay

## 2021-07-08 MED ORDER — PRAVASTATIN SODIUM 40 MG PO TABS: 40.0000 mg | ORAL_TABLET | Freq: Every day | ORAL | 1 refills | Status: DC

## 2021-07-08 NOTE — Telephone Encounter (Signed)
-----   Message from [name], MD sent at 07/05/2021  7:50 AM EDT ----- Cholesterol is too high. Need to increase pravastatin to '40mg'$  once a day. Can send in prescription for #90 rf x 1.  A1c is stable at 6.1 Rest of labs are good. Please schedule follow up in 6 months.

## 2021-07-08 NOTE — Telephone Encounter (Signed)
Pt advised.  RX sent to [facility].   Thanks,   -Mickel Baas

## 2021-07-17 ENCOUNTER — Other Ambulatory Visit: Payer: Self-pay | Admitting: Family Medicine

## 2021-07-17 DIAGNOSIS — S149XXA Injury of unspecified nerves of neck, initial encounter: Secondary | ICD-10-CM

## 2021-07-17 MED ORDER — HYDROCODONE-ACETAMINOPHEN 7.5-325 MG PO TABS: 1.0000 | ORAL_TABLET | Freq: Four times a day (QID) | ORAL | 0 refills | Status: DC | PRN

## 2021-07-17 NOTE — Telephone Encounter (Signed)
Medication Refill - Medication: Hydrocodone 7.5/325  Has the patient contacted their pharmacy? No. (Agent: If no, request that the patient contact the pharmacy for the refill.) (Agent: If yes, when and what did the pharmacy advise?)  Preferred Pharmacy (with phone number or street name): [facility]  s and [address] Has the patient been seen for an appointment in the last year OR does the patient have an upcoming appointment? Yes.    Agent: Please be advised that RX refills may take up to 3 business days. We ask that you follow-up with your pharmacy.

## 2021-07-17 NOTE — Telephone Encounter (Signed)
Requested medication (s) are due for refill today:   Provider to review  Requested medication (s) are on the active medication list:   Yes  Future visit scheduled:   Yes   Last ordered: 07/03/2021 #30, 0 refills  Non delegated refill   Requested Prescriptions  Pending Prescriptions Disp Refills   HYDROcodone-acetaminophen (NORCO) 7.5-325 MG tablet 30 tablet 0    Sig: Take 1 tablet by mouth every 6 (six) hours as needed for moderate pain.     Not Delegated - Analgesics:  Opioid Agonist Combinations Failed - 07/17/2021 10:25 AM      Failed - This refill cannot be delegated      Failed - Urine Drug Screen completed in last 360 days      Passed - Valid encounter within last 6 months    Recent Outpatient Visits           2 weeks ago Hypothyroidism, unspecified type   [facility] [name], MD   6 months ago Hypothyroidism, unspecified type   [facility] [name], MD   9 months ago Benign essential HTN   [facility] [name], MD   12 months ago Benign essential HTN   [facility] [name], MD   1 year ago Benign essential HTN   [facility] [name], MD       Future Appointments             In 5 months Fisher, Kirstie Peri, MD [facility], [place]

## 2021-08-07 ENCOUNTER — Other Ambulatory Visit: Payer: Self-pay | Admitting: Family Medicine

## 2021-08-07 DIAGNOSIS — S149XXA Injury of unspecified nerves of neck, initial encounter: Secondary | ICD-10-CM

## 2021-08-07 MED ORDER — HYDROCODONE-ACETAMINOPHEN 7.5-325 MG PO TABS: 1.0000 | ORAL_TABLET | Freq: Four times a day (QID) | ORAL | 0 refills | Status: DC | PRN

## 2021-08-07 NOTE — Telephone Encounter (Signed)
Copied from [place] [phone]. Topic: Quick Communication - Rx Refill/Question >> Aug 07, 2021 12:52 PM Leward Quan A wrote: Medication: HYDROcodone-acetaminophen ([place]) 7.5-325 MG tablet  Has the patient contacted their pharmacy? No. Because its a controlled substance  (Agent: If no, request that the patient contact the pharmacy for the refill.) (Agent: If yes, when and what did the pharmacy advise?)  Preferred Pharmacy (with phone number or street name): [facility] DR[facility]ORE #[id number], [place]  Phone:  [phone] Fax:  [phone]    Has the patient been seen for an appointment in the last year OR does the patient have an upcoming appointment? Yes.    Agent: Please be advised that RX refills may take up to 3 business days. We ask that you follow-up with your pharmacy.

## 2021-08-07 NOTE — Telephone Encounter (Signed)
LOV: 07/03/2021   Thanks,   -Mickel Baas

## 2021-08-07 NOTE — Telephone Encounter (Signed)
Requested medication (s) are due for refill today: yes  Requested medication (s) are on the active medication list: yes  Last refill:  07/17/21 #30  Future visit scheduled: yes  Notes to clinic:  Please review for refill. Refill not delegated per protocol    Requested Prescriptions  Pending Prescriptions Disp Refills   HYDROcodone-acetaminophen (NORCO) 7.5-325 MG tablet 30 tablet 0    Sig: Take 1 tablet by mouth every 6 (six) hours as needed for moderate pain.     Not Delegated - Analgesics:  Opioid Agonist Combinations Failed - 08/07/2021  2:12 PM      Failed - This refill cannot be delegated      Failed - Urine Drug Screen completed in last 360 days      Passed - Valid encounter within last 6 months    Recent Outpatient Visits           1 month ago Hypothyroidism, unspecified type   [facility] [name], MD   7 months ago Hypothyroidism, unspecified type   [facility] [name], MD   10 months ago Benign essential HTN   [facility] [name], MD   1 year ago Benign essential HTN   [facility] [name], MD   1 year ago Benign essential HTN   [facility] [name], MD       Future Appointments             In 4 months Fisher, Kirstie Peri, MD [facility], [place]

## 2021-08-21 ENCOUNTER — Other Ambulatory Visit: Payer: Self-pay | Admitting: Family Medicine

## 2021-08-21 DIAGNOSIS — S149XXA Injury of unspecified nerves of neck, initial encounter: Secondary | ICD-10-CM

## 2021-08-21 NOTE — Addendum Note (Signed)
Addended by: Matilde Sprang on: 08/21/2021 02:56 PM   Modules accepted: Orders

## 2021-08-21 NOTE — Telephone Encounter (Signed)
Patient called to verify which medication she needs a refill on, she says Hydrocodone.

## 2021-08-21 NOTE — Telephone Encounter (Signed)
Medication Refill - Medication:   Has the patient contacted their pharmacy? No. (Agent: If no, request that the patient contact the pharmacy for the refill. If patient does not wish to contact the pharmacy document the reason why and proceed with request.) (Agent: If yes, when and what did the pharmacy advise?)Pt stated she usually calls the office when advised of calling pharmacy   Preferred Pharmacy (with phone number or street name): [facility] DRUG STORE [place], [place] - [address]  [place], [place] [address]  Phone:  [phone]  Fax:  [phone]  Has the patient been seen for an appointment in the last year OR does the patient have an upcoming appointment? Yes.    Agent: Please be advised that RX refills may take up to 3 business days. We ask that you follow-up with your pharmacy.

## 2021-08-22 ENCOUNTER — Emergency Department
Admission: EM | Admit: 2021-08-22 | Discharge: 2021-08-22 | Disposition: A | Discharge status: Home / Self Care | Payer: BC Managed Care – PPO | Attending: Emergency Medicine | Admitting: Emergency Medicine

## 2021-08-22 ENCOUNTER — Other Ambulatory Visit: Payer: Self-pay

## 2021-08-22 DIAGNOSIS — F17210 Nicotine dependence, cigarettes, uncomplicated: Secondary | ICD-10-CM | POA: Diagnosis not present

## 2021-08-22 DIAGNOSIS — Z79899 Other long term (current) drug therapy: Secondary | ICD-10-CM | POA: Diagnosis not present

## 2021-08-22 DIAGNOSIS — E039 Hypothyroidism, unspecified: Secondary | ICD-10-CM | POA: Diagnosis not present

## 2021-08-22 DIAGNOSIS — H9209 Otalgia, unspecified ear: Secondary | ICD-10-CM | POA: Diagnosis not present

## 2021-08-22 DIAGNOSIS — U071 COVID-19: Secondary | ICD-10-CM | POA: Diagnosis not present

## 2021-08-22 DIAGNOSIS — I10 Essential (primary) hypertension: Secondary | ICD-10-CM | POA: Diagnosis not present

## 2021-08-22 DIAGNOSIS — R55 Syncope and collapse: Secondary | ICD-10-CM | POA: Insufficient documentation

## 2021-08-22 DIAGNOSIS — R519 Headache, unspecified: Secondary | ICD-10-CM | POA: Diagnosis not present

## 2021-08-22 LAB — RESP PANEL BY RT-PCR (FLU A&B, COVID) ARPGX2
Influenza A by PCR: NEGATIVE
Influenza B by PCR: NEGATIVE
SARS Coronavirus 2 by RT PCR: POSITIVE — AB

## 2021-08-22 MED ORDER — NIRMATRELVIR/RITONAVIR (PAXLOVID)TABLET: ORAL_TABLET | ORAL | 0 refills | Status: DC

## 2021-08-22 MED ORDER — ACETAMINOPHEN 325 MG PO TABS
650.0000 mg | ORAL_TABLET | Freq: Once | ORAL | Status: AC
  Administered 2021-08-22: 650 mg via ORAL
  Filled 2021-08-22: qty 2

## 2021-08-22 MED ORDER — ONDANSETRON 4 MG PO TBDP
4.0000 mg | ORAL_TABLET | Freq: Once | ORAL | Status: AC
  Administered 2021-08-22: 4 mg via ORAL
  Filled 2021-08-22: qty 1

## 2021-08-22 MED ORDER — KETOROLAC TROMETHAMINE 30 MG/ML IJ SOLN
30.0000 mg | Freq: Once | INTRAMUSCULAR | Status: AC
  Administered 2021-08-22: 30 mg via INTRAMUSCULAR

## 2021-08-22 MED ORDER — KETOROLAC TROMETHAMINE 30 MG/ML IJ SOLN
30.0000 mg | Freq: Once | INTRAMUSCULAR | Status: DC
  Filled 2021-08-22: qty 1

## 2021-08-22 MED ORDER — ONDANSETRON 4 MG PO TBDP: 4.0000 mg | ORAL_TABLET | Freq: Three times a day (TID) | ORAL | 0 refills | Status: DC | PRN

## 2021-08-22 NOTE — Telephone Encounter (Signed)
Requested medication (s) are due for refill today:yes  Requested medication (s) are on the active medication list: yes  Last refill: 08/07/21 # 30  0 refills  Future visit scheduled yes  12/28/21  Notes to clinic: not delegated  Requested Prescriptions  Pending Prescriptions Disp Refills   HYDROcodone-acetaminophen (NORCO) 7.5-325 MG tablet 30 tablet 0    Sig: Take 1 tablet by mouth every 6 (six) hours as needed for moderate pain.     Not Delegated - Analgesics:  Opioid Agonist Combinations Failed - 08/21/2021  2:56 PM      Failed - This refill cannot be delegated      Failed - Urine Drug Screen completed in last 360 days      Passed - Valid encounter within last 6 months    Recent Outpatient Visits           1 month ago Hypothyroidism, unspecified type   [facility] [name], MD   7 months ago Hypothyroidism, unspecified type   [facility] [name], MD   10 months ago Benign essential HTN   [facility] [name], MD   1 year ago Benign essential HTN   [facility] [name], MD   1 year ago Benign essential HTN   [facility] [name], MD       Future Appointments             In 4 months Fisher, Kirstie Peri, MD [facility], [place]

## 2021-08-22 NOTE — Discharge Instructions (Addendum)
You were diagnosed with Covid. We encourage rest and fluids. I am prescribing you an oral antiviral to take for 5 days, as well as nausea medication. You need to hold your Meloxicam, Wellbutrin and Pravastatin while you are taking the antiviral medication. You may take Tylenol and cough syrup as needed for symptoms management. Follow up with your PCP on Monday

## 2021-08-22 NOTE — ED Provider Notes (Addendum)
[facility] Emergency Department Provider Note ____________________________________________  Time seen: 0735  I have reviewed the triage vital signs and the nursing notes.  HISTORY  Chief Complaint  Headache and Generalized Body Aches   HPI Jamie Williams is a 60 y.o. female presents to the ER today with c/o headache, nausea and cough. She reports this started 2 days ago. The headache is located all over. She describes the pain as throbbing. She reports associated blurred vision but denies dizziness or syncope. The cough is non productive. She denies runny nose, nasal congestion, ear pain, sore throat, shortness of breath, vomiting or diarrhea. She has had fever, chills and body aches. She has taken Tylenol OTC with minimal relief of symptoms. She has had contacts with similar symptoms but no know covid exposure that she is aware of. She has had 2 covid vaccines.   Past Medical History:  Diagnosis Date   Anxiety    Family history of adverse reaction to anesthesia    "sister had a difficult time waking up from mastectomy and passed away"    GERD (gastroesophageal reflux disease)    History of chicken pox    History of kidney stones    PONV (postoperative nausea and vomiting)    nausea   Sciatic nerve pain     Patient Active Problem List   Diagnosis Date Noted   Hypothyroid 07/22/2020   Chronic, continuous use of opioids 04/15/2020   Pap smear abnormality of cervix/human papillomavirus (HPV) positive 08/06/2019   Aortic atherosclerosis ([place]) 10/23/2018   GERD (gastroesophageal reflux disease)    Menopausal symptoms 01/10/2017   Cervical spondylosis with radiculopathy 12/24/2015   Compression injury of cervical spinal nerve 10/07/2015   Left arm pain 08/29/2015   Neck pain 08/29/2015   Hematuria 07/03/2015   Allergic arthritis 06/27/2015   Arthritis 06/27/2015   Back ache 06/27/2015   Clinical depression 06/27/2015   H/O abnormal cervical Papanicolaou  smear 06/27/2015   Elevated intracranial pressure 06/27/2015   Kidney stones 06/27/2015   Knee pain 06/27/2015   Abnormal mammogram 06/27/2015   Headache, migraine 06/27/2015   Neuropathy 06/27/2015   Hand paresthesia 06/27/2015   Pre-diabetes 06/27/2015   Vitamin D deficiency 06/27/2015   Weight loss 06/27/2015   Adjustment reaction 06/27/2015   Hypercholesterolemia without hypertriglyceridemia 01/29/2010   Tobacco abuse 01/28/2010   Benign essential HTN 09/16/2009   Insomnia 09/16/2009   Lesion of ulnar nerve 09/16/2009    Past Surgical History:  Procedure Laterality Date   ANTERIOR CERVICAL DECOMP/DISCECTOMY FUSION N/A 12/24/2015   Procedure: Cervical five-six, Cervical six-seven  Anterior cervical decompression/diskectomy/fusion/interbody prosthesis/plate;  Surgeon: Newman Pies, MD;  Location: [facility] NEURO ORS;  Service: Neurosurgery;  Laterality: N/A;   COLONOSCOPY WITH PROPOFOL N/A 07/17/2018   Procedure: COLONOSCOPY WITH PROPOFOL;  Surgeon: Jonathon Bellows, MD;  Location: [facility] ENDOSCOPY;  Service: Gastroenterology;  Laterality: N/A;   [place] and 1996   two Etopic preg.   Head Injuries  2006   surgery 2006 Cram/NS in [place]. Headaches with increased intracranial pressure. Repeat MRI in 2008 Negative   SUPRACERVICAL ABDOMINAL HYSTERECTOMY  2011   Abdominal; Ovaries intact; CERVIX INTACT. Fibroids/dys menorrhea. [place]   Ulnar Neuropathy Right 2004   Ulnar Neuropathy surgical revision. 2004-2008. [place].    Prior to Admission medications   Medication Sig Start Date End Date Taking? Authorizing Provider  nirmatrelvir/ritonavir EUA (PAXLOVID) 20 x 150 MG & 10 x 100MG  TABS GFR is > 60. Take nirmatrelvir (150  mg) 2 tab PO BID for 5 days and ritonavir (100 mg) 1 tab  PO daily for 5 days. 08/22/21  Yes Akshith Moncus, Coralie Keens, NP  ondansetron (ZOFRAN ODT) 4 MG disintegrating tablet Take 1 tablet (4 mg total) by mouth every 8 (eight) hours as needed for  nausea or vomiting. 08/22/21  Yes Jearld Fenton, NP  albuterol (VENTOLIN HFA) 108 (90 Base) MCG/ACT inhaler Inhale 2 puffs into the lungs every 6 (six) hours as needed for wheezing or shortness of breath. 04/15/20   [name], MD  amLODipine (NORVASC) 10 MG tablet Take 1 tablet (10 mg total) by mouth daily. 12/08/20   [name], MD  buPROPion (WELLBUTRIN SR) 150 MG 12 hr tablet Take 1 tablet (150 mg total) by mouth 2 (two) times daily. 07/03/21   [name], MD  cholecalciferol (VITAMIN D) 1000 units tablet Take 2,000 Units by mouth daily.     [provider]  estradiol (ESTRACE) 2 MG tablet Take 1 tablet (2 mg total) by mouth daily. 01/07/21   [name], MD  gabapentin (NEURONTIN) 300 MG capsule TAKE 1 CAPSULE BY MOUTH THREE TIMES A DAY 07/03/21   [name], MD  hydrochlorothiazide (HYDRODIURIL) 25 MG tablet Take 1 tablet (25 mg total) by mouth daily. 07/25/20   [name], MD  HYDROcodone-acetaminophen (NORCO) 7.5-325 MG tablet Take 1 tablet by mouth every 6 (six) hours as needed for moderate pain. 08/07/21   [name], MD  levothyroxine (SYNTHROID) 50 MCG tablet TAKE 1 TABLET(50 MCG) BY MOUTH DAILY 06/03/21   [name], MD  meloxicam (MOBIC) 15 MG tablet Take 1 tablet (15 mg total) by mouth daily. with food 01/07/21   [name], MD  PARoxetine (PAXIL) 30 MG tablet Take 2 tablets (60 mg total) by mouth daily. 07/03/21   [name], MD  pravastatin (PRAVACHOL) 40 MG tablet Take 1 tablet (40 mg total) by mouth daily. 07/08/21   [name], MD  promethazine (PHENERGAN) 25 MG tablet TAKE 1 TABLET BY MOUTH EVERY 4 TO 6 HOURS AS NEEDED FOR NAUSEA 03/17/21   [name], MD  ranitidine (ZANTAC) 150 MG tablet RANITIDINE HCL, 150MG  (Oral Tablet)  1 Two Times A Day for heartburn, indigestion, nausea for 0 days  Quantity: 60.00;  Refills: 5   Ordered :22-Jul-2010  Reginia Forts MD;  Started 28-Jan-2010 Active Comments: DX: 787.02  01/28/10   [provider]    Allergies Aspirin, Penicillins, and Sulfa antibiotics  Family History  Problem Relation Age of Onset   Hypertension Mother    Diabetes Mother        Type 2   Breast cancer Mother 26   Cancer Sister 1       Breast   Diabetes Sister        type 2   Breast cancer Sister 50   Diabetes Sister    Diabetes Sister    Diabetes Sister        type 2   Liver cancer Brother    Stomach cancer Brother     Social History Social History   Tobacco Use   Smoking status: Every Day    Packs/day: 0.50    Types: Cigarettes   Smokeless tobacco: Never   Tobacco comments:    Started about 60 years old usually 1/2 ppd  Vaping Use   Vaping Use: Never used  Substance Use Topics   Alcohol use: Not Currently  Alcohol/week: 0.0 standard drinks   Drug use: Not Currently    Review of Systems  Constitutional: Positive for fever, chills and body aches. Eyes: Positive for blurred vision. Negative for eye pain, redness or discharge. ENT: Negative for runny  nose, nasal congestion, ear pain or sore throat. Cardiovascular: Negative for chest pain or chest tightness. Respiratory: Positive for cough. Negative for shortness of breath. Gastrointestinal: Positive for nausea. Negative for abdominal pain, vomiting and diarrhea. Skin: Negative for rash. Neurological: Positive for headache. Negative for focal weakness or syncope. ____________________________________________  PHYSICAL EXAM:  VITAL SIGNS: ED Triage Vitals  Enc Vitals Group     BP 08/22/21 0041 (!) 150/94     Pulse Rate 08/22/21 0041 76     Resp 08/22/21 0041 18     Temp 08/22/21 0041 99.1 F (37.3 C)     Temp Source 08/22/21 0041 Oral     SpO2 08/22/21 0041 99 %     Weight 08/22/21 0041 180 lb (81.6 kg)     Height 08/22/21 0041 5\' 5"  (1.651 m)     Head Circumference --      Peak Flow --      Pain Score 08/22/21 0051 10     Pain Loc --      Pain Edu? --      Excl. in [place]? --      Constitutional: Alert and oriented. Appear unwell but in NAD. Head: Normocephalic. Eyes: Normal extraocular movements Mouth/Throat: Mucous membranes are moist. No posterior pharynx erythema or exudate noted. Hematological/Lymphatic/Immunological: No cervical lymphadenopathy. Cardiovascular: Normal rate, regular rhythm.  Respiratory: Normal respiratory effort. No wheezes/rales/rhonchi noted. Gastrointestinal: Soft and nontender.  Neurologic:  Normal speech and language. No gross focal neurologic deficits are appreciated. Skin:  Skin is warm, dry and intact. No rash noted. ____________________________________________   LABS Labs Reviewed  RESP PANEL BY RT-PCR (FLU A&B, COVID) ARPGX2 - Abnormal; Notable for the following components:      Result Value   SARS Coronavirus 2 by RT PCR POSITIVE (*)    All other components within normal limits   ____________________________________________  INITIAL IMPRESSION / ASSESSMENT AND PLAN / ED COURSE  Headache, Cough, Nausea, Fever, Chills and Body Aches:   DDx include influenza, covid, viral uri with cough Covid test positive Tylenol 650 mg PO x 1 Zofran 4 mg PO x 1 Toradol 30 mg IM x 1 RX for Paxlovid BID x 5 days RX for Zofran 4 mg Q8H prn Encouraged rest and fluids Follow up with PCP on Monday   _______________________________________________  FINAL CLINICAL IMPRESSION(S) / ED DIAGNOSES  Final diagnoses:  COVID-19      Jearld Fenton, NP 08/22/21 0758    Jearld Fenton, NP 08/22/21 6226    Naaman Plummer, MD 08/26/21 [phone]

## 2021-08-22 NOTE — ED Triage Notes (Signed)
Pt presents to ER c/o body aches, HA, nausea and generalized weakness since Thursday night.  Pt states her husband and son were sick with something recently, but unknown what it was.  Pt A&O x4 at this time. No resp distress noted.

## 2021-08-24 ENCOUNTER — Ambulatory Visit: Payer: Self-pay

## 2021-08-24 MED ORDER — HYDROCODONE-ACETAMINOPHEN 7.5-325 MG PO TABS: 1.0000 | ORAL_TABLET | Freq: Four times a day (QID) | ORAL | 0 refills | Status: DC | PRN

## 2021-08-24 NOTE — Telephone Encounter (Signed)
Pt called and stated she had severe headache since 08/20/21, went to [facility] ED on 08/22/21 and tested positive for COVID. [facility] told her to follow up with PCP. Pt has symptoms of headache, chills, body aches, and dry cough. She has been taking Tylenol for headache and fever of 99.6. She stated that her symptoms are improving but her head is still hurting. Advised pt to schedule virtual appt, appt made for 08/25/21 at 1120 with Dr. Caryn Section via telephone. Care advice given and pt verbalized understanding.   Summary: cough and congestion   The patient has tested positive for COVID at [facility]   The patient was directed to follow up with their PCP   The patient would like to be prescribed something to help with discomfort headache, body aches, nausea and sinus discomfort     The patient has been experiencing symptoms since 08/20/21   Please contact to discuss further      Reason for Disposition  [1] Fever returns after gone for over 24 hours AND [2] symptoms worse or not improved  Answer Assessment - Initial Assessment Questions 1. COVID-19 DIAGNOSIS: "Who made your COVID-19 diagnosis?" "Was it confirmed by a positive lab test or self-test?" If not diagnosed by a doctor (or NP/PA), ask "Are there lots of cases (community spread) where you live?" Note: See public health department website, if unsure.     COVID tested 10/30 [facility] 2. COVID-19 EXPOSURE: "Was there any known exposure to COVID before the symptoms began?" CDC Definition of close contact: within 6 feet (2 meters) for a total of 15 minutes or more over a 24-hour period.      unsure 3. ONSET: "When did the COVID-19 symptoms start?"      08/20/21 4. WORST SYMPTOM: "What is your worst symptom?" (e.g., cough, fever, shortness of breath, muscle aches)     headache 5. COUGH: "Do you have a cough?" If Yes, ask: "How bad is the cough?"       Yes, dry cough 6. FEVER: "Do you have a fever?" If Yes, ask: "What is your temperature, how was it  measured, and when did it start?"     Yes, 99.6 7. RESPIRATORY STATUS: "Describe your breathing?" (e.g., shortness of breath, wheezing, unable to speak)      No 8. BETTER-SAME-WORSE: "Are you getting better, staying the same or getting worse compared to yesterday?"  If getting worse, ask, "In what way?"     better 9. HIGH RISK DISEASE: "Do you have any chronic medical problems?" (e.g., asthma, heart or lung disease, weak immune system, obesity, etc.)     No 10. VACCINE: "Have you had the COVID-19 vaccine?" If Yes, ask: "Which one, how many shots, when did you get it?"       Yes, pfizer 4/21, 5/21 11. BOOSTER: "Have you received your COVID-19 booster?" If Yes, ask: "Which one and when did you get it?"       No 12. PREGNANCY: "Is there any chance you are pregnant?" "When was your last menstrual period?"       No 13. OTHER SYMPTOMS: "Do you have any other symptoms?"  (e.g., chills, fatigue, headache, loss of smell or taste, muscle pain, sore throat)       Chills, body aches, headache 14. O2 SATURATION MONITOR:  "Do you use an oxygen saturation monitor (pulse oximeter) at home?" If Yes, ask "What is your reading (oxygen level) today?" "What is your usual oxygen saturation reading?" (e.g., 95%)  No  Protocols used: Coronavirus (MMEEG-56) Diagnosed or Suspected-A-AH

## 2021-08-25 ENCOUNTER — Ambulatory Visit (INDEPENDENT_AMBULATORY_CARE_PROVIDER_SITE_OTHER): Payer: BC Managed Care – PPO | Admitting: Family Medicine

## 2021-08-25 ENCOUNTER — Encounter: Payer: Self-pay | Admitting: Family Medicine

## 2021-08-25 DIAGNOSIS — U071 COVID-19: Secondary | ICD-10-CM

## 2021-08-25 NOTE — Progress Notes (Signed)
Virtual telephone visit    Virtual Visit via Telephone Note   This visit type was conducted due to national recommendations for restrictions regarding the COVID-19 Pandemic (e.g. social distancing) in an effort to limit this patient's exposure and mitigate transmission in our community. Due to her co-morbid illnesses, this patient is at least at moderate risk for complications without adequate follow up. This format is felt to be most appropriate for this patient at this time. The patient did not have access to video technology or had technical difficulties with video requiring transitioning to audio format only (telephone). Physical exam was limited to content and character of the telephone converstion.    Patient location: home Provider location: [facility]  I discussed the limitations of evaluation and management by telemedicine and the availability of in person appointments. The patient expressed understanding and agreed to proceed.   Visit Date: 08/25/2021  Today's healthcare provider: Lelon Huh, MD   Chief Complaint  Patient presents with   Covid Positive   Subjective    HPI   Follow up ER visit  Patient was seen in ER for Headache and generalized body aches on 08/22/2021. She was treated for COVID. Treatment for this included rx for Paxlovid, and ondansetron. She reports good compliance with treatment. She reports this condition is Improved. Patient still feels weak and tired. She has had some sweats. She states her symptoms started 5 days ago. Patient denies any fever.   -----------------------------------------------------------------------------------------     Medications: Outpatient Medications Prior to Visit  Medication Sig   albuterol (VENTOLIN HFA) 108 (90 Base) MCG/ACT inhaler Inhale 2 puffs into the lungs every 6 (six) hours as needed for wheezing or shortness of breath.   amLODipine (NORVASC) 10 MG tablet Take 1 tablet (10 mg total) by mouth daily.    buPROPion (WELLBUTRIN SR) 150 MG 12 hr tablet Take 1 tablet (150 mg total) by mouth 2 (two) times daily.   cholecalciferol (VITAMIN D) 1000 units tablet Take 2,000 Units by mouth daily.    estradiol (ESTRACE) 2 MG tablet Take 1 tablet (2 mg total) by mouth daily.   gabapentin (NEURONTIN) 300 MG capsule TAKE 1 CAPSULE BY MOUTH THREE TIMES A DAY   hydrochlorothiazide (HYDRODIURIL) 25 MG tablet Take 1 tablet (25 mg total) by mouth daily.   HYDROcodone-acetaminophen (NORCO) 7.5-325 MG tablet Take 1 tablet by mouth every 6 (six) hours as needed for moderate pain.   levothyroxine (SYNTHROID) 50 MCG tablet TAKE 1 TABLET(50 MCG) BY MOUTH DAILY   meloxicam (MOBIC) 15 MG tablet Take 1 tablet (15 mg total) by mouth daily. with food   nirmatrelvir/ritonavir EUA (PAXLOVID) 20 x 150 MG & 10 x 100MG  TABS GFR is > 60. Take nirmatrelvir (150 mg) 2 tab PO BID for 5 days and ritonavir (100 mg) 1 tab  PO daily for 5 days.   ondansetron (ZOFRAN ODT) 4 MG disintegrating tablet Take 1 tablet (4 mg total) by mouth every 8 (eight) hours as needed for nausea or vomiting.   PARoxetine (PAXIL) 30 MG tablet Take 2 tablets (60 mg total) by mouth daily.   pravastatin (PRAVACHOL) 40 MG tablet Take 1 tablet (40 mg total) by mouth daily.   promethazine (PHENERGAN) 25 MG tablet TAKE 1 TABLET BY MOUTH EVERY 4 TO 6 HOURS AS NEEDED FOR NAUSEA   ranitidine (ZANTAC) 150 MG tablet RANITIDINE HCL, 150MG  (Oral Tablet)  1 Two Times A Day for heartburn, indigestion, nausea for 0 days  Quantity: 60.00;  Refills:  5   Ordered :22-Jul-2010  Reginia Forts MD;  Buddy Duty 28-Jan-2010 Active Comments: DX: 787.02   No facility-administered medications prior to visit.    Review of Systems  Constitutional:  Positive for fatigue. Negative for appetite change, chills and fever.  Respiratory:  Negative for chest tightness and shortness of breath.   Cardiovascular:  Negative for chest pain and palpitations.  Gastrointestinal:  Negative for abdominal  pain, nausea and vomiting.  Neurological:  Positive for weakness. Negative for dizziness.     Objective    There were no vitals taken for this visit.   Marland [place]  Assessment & Plan     1. COVID-19 Improving steadily with paxlovid and ondansetron. Call if symptoms change or if not rapidly improving.         I discussed the assessment and treatment plan with the patient. The patient was provided an opportunity to ask questions and all were answered. The patient agreed with the plan and demonstrated an understanding of the instructions.   The patient was advised to call back or seek an in-person evaluation if the symptoms worsen or if the condition fails to improve as anticipated.  I provided 6 minutes of non-face-to-face time during this encounter.  The entirety of the information documented in the History of Present Illness, Review of Systems and Physical Exam were personally obtained by me. Portions of this information were initially documented by the CMA and reviewed by me for thoroughness and accuracy.    Lelon Huh, MD [facility] [phone] (phone) [phone] (fax)  [place]

## 2021-09-11 ENCOUNTER — Other Ambulatory Visit: Payer: Self-pay | Admitting: Family Medicine

## 2021-09-11 DIAGNOSIS — S149XXA Injury of unspecified nerves of neck, initial encounter: Secondary | ICD-10-CM

## 2021-09-11 MED ORDER — HYDROCODONE-ACETAMINOPHEN 7.5-325 MG PO TABS: 1.0000 | ORAL_TABLET | Freq: Four times a day (QID) | ORAL | 0 refills | Status: DC | PRN

## 2021-09-11 NOTE — Telephone Encounter (Signed)
LOV: 08/25/2021  NOV: 12/28/2021   Thanks,   -Mickel Baas

## 2021-09-11 NOTE — Telephone Encounter (Signed)
Requested medication (s) are due for refill today: Yes  Requested medication (s) are on the active medication list: Yes  Last refill:  08/24/21 #30/0RF  Future visit scheduled: Yes  Notes to clinic:  Unable to refill per protocol, cannot delegate.    Requested Prescriptions  Pending Prescriptions Disp Refills   HYDROcodone-acetaminophen (NORCO) 7.5-325 MG tablet 30 tablet 0    Sig: Take 1 tablet by mouth every 6 (six) hours as needed for moderate pain.     Not Delegated - Analgesics:  Opioid Agonist Combinations Failed - 09/11/2021 10:06 AM      Failed - This refill cannot be delegated      Failed - Urine Drug Screen completed in last 360 days      Passed - Valid encounter within last 6 months    Recent Outpatient Visits           2 weeks ago Briny B[place]zes, Donald E, MD   2 months ago Hypothyroidism, unspecified type   [facility] [name], MD   8 months ago Hypothyroidism, unspecified type   [facility] [name], MD   11 months ago Benign essential HTN   [facility] [name], MD   1 year ago Benign essential HTN   [facility] [name], MD       Future Appointments             In 3 months Fisher, Kirstie Peri, MD [facility] [place]

## 2021-09-11 NOTE — Telephone Encounter (Signed)
Medication Refill - Medication: Hydrocodone   Has the patient contacted their pharmacy? No. Pt states that she always has to call this into office. Please advise.  (Agent: If no, request that the patient contact the pharmacy for the refill. If patient does not wish to contact the pharmacy document the reason why and proceed with request.) (Agent: If yes, when and what did the pharmacy advise?)  Preferred Pharmacy (with phone number or street name):  [facility][id number] [address], [place]  [place] [address]  Phone: [phone] Fax: [phone]  Hours: Not open 24 hours   Has the patient been seen for an appointment in the last year OR does the patient have an upcoming appointment? Yes.    Agent: Please be advised that RX refills may take up to 3 business days. We ask that you follow-up with your pharmacy.

## 2021-09-24 ENCOUNTER — Ambulatory Visit: Payer: Self-pay | Admitting: *Deleted

## 2021-09-24 NOTE — Telephone Encounter (Signed)
Pt reports cough, onset Tuesday. Reports productive for yellowish phlegm, coughing spells during day, keeping her awake at night. Temp today 99.8, MAx yesterday 100.0  Also reports wheezing at times "With coughing." SOB with exertion. Taking Coricidin "Helps with the body aches, not the cough so much." No availability at practice. Advised E visit, states will follow disposition. CAre advise given, pt verbalizes understanding.

## 2021-09-24 NOTE — Telephone Encounter (Signed)
Pt called in stating she thinks she has the flu, pt stated she is running a fever, body aches as well, stated No to having Covid, pt wanted to discuss with a nurse.    Reason for Disposition  Wheezing is present  Answer Assessment - Initial Assessment Questions 1. ONSET: "When did the cough begin?"      Tuesday 2. SEVERITY: "How bad is the cough today?"      Bad spells , awake at night 3. SPUTUM: "Describe the color of your sputum" (none, dry cough; clear, white, yellow, green)     Yellowish 4. HEMOPTYSIS: "Are you coughing up any blood?" If so ask: "How much?" (flecks, streaks, tablespoons, etc.)     no 5. DIFFICULTY BREATHING: "Are you having difficulty breathing?" If Yes, ask: "How bad is it?" (e.g., mild, moderate, severe)    - MILD: No SOB at rest, mild SOB with walking, speaks normally in sentences, can lie down, no retractions, pulse < 100.    - MODERATE: SOB at rest, SOB with minimal exertion and prefers to sit, cannot lie down flat, speaks in phrases, mild retractions, audible wheezing, pulse 100-120.    - SEVERE: Very SOB at rest, speaks in single words, struggling to breathe, sitting hunched forward, retractions, pulse > 120      SOB with exertion 6. FEVER: "Do you have a fever?" If Yes, ask: "What is your temperature, how was it measured, and when did it start?"     Today 99.8   Max 100.0 7. CARDIAC HISTORY: "Do you have any history of heart disease?" (e.g., heart attack, congestive heart failure)      *No Answer* 8. LUNG HISTORY: "Do you have any history of lung disease?"  (e.g., pulmonary embolus, asthma, emphysema)     *No Answer* 9. PE RISK FACTORS: "Do you have a history of blood clots?" (or: recent major surgery, recent prolonged travel, bedridden)     *No Answer* 10. OTHER SYMPTOMS: "Do you have any other symptoms?" (e.g., runny nose, wheezing, chest pain)       Wheezing at times when coughing  Protocols used: Cough - Acute Productive-A-AH

## 2021-09-25 NOTE — Telephone Encounter (Signed)
FYI see message below from triage nurse, patient was advised to schedule evisit., Amparo Bristol

## 2021-09-25 NOTE — Telephone Encounter (Signed)
FYI

## 2021-09-28 ENCOUNTER — Other Ambulatory Visit: Payer: Self-pay | Admitting: Family Medicine

## 2021-09-28 DIAGNOSIS — S149XXA Injury of unspecified nerves of neck, initial encounter: Secondary | ICD-10-CM

## 2021-09-28 NOTE — Telephone Encounter (Signed)
Requested medications are due for refill today.  yes  Requested medications are on the active medications list.  yes  Last refill. 09/11/2021  Future visit scheduled.   yes  Notes to clinic.  Medication not delegated.

## 2021-09-28 NOTE — Telephone Encounter (Signed)
Medication Refill - Medication: HYDROcodone-acetaminophen (NORCO) 7.5-325 MG tablet  Has the patient contacted their pharmacy? No. (Pt says she always has to cll it in. Preferred Pharmacy (with phone number or street name): [facility] #[id number] - [place], [place] Has the patient been seen for an appointment in the last year OR does the patient have an upcoming appointment? Yes.    Agent: Please be advised that RX refills may take up to 3 business days. We ask that you follow-up with your pharmacy.

## 2021-09-29 MED ORDER — HYDROCODONE-ACETAMINOPHEN 7.5-325 MG PO TABS: 1.0000 | ORAL_TABLET | Freq: Four times a day (QID) | ORAL | 0 refills | Status: DC | PRN

## 2021-09-29 NOTE — Telephone Encounter (Signed)
LOV: 07/03/2021  NOV: 12/28/2021

## 2021-10-13 ENCOUNTER — Other Ambulatory Visit: Payer: Self-pay | Admitting: Family Medicine

## 2021-10-13 DIAGNOSIS — S149XXA Injury of unspecified nerves of neck, initial encounter: Secondary | ICD-10-CM

## 2021-10-13 DIAGNOSIS — E039 Hypothyroidism, unspecified: Secondary | ICD-10-CM

## 2021-10-13 NOTE — Telephone Encounter (Signed)
Requested medications are due for refill today.  unsure  Requested medications are on the active medications list.  yes  Last refill. 09/29/2021  Future visit scheduled.   yes  Notes to clinic.  Medication not delegated.    Requested Prescriptions  Pending Prescriptions Disp Refills   HYDROcodone-acetaminophen (NORCO) 7.5-325 MG tablet 30 tablet 0    Sig: Take 1 tablet by mouth every 6 (six) hours as needed for moderate pain.     Not Delegated - Analgesics:  Opioid Agonist Combinations Failed - 10/13/2021  2:16 PM      Failed - This refill cannot be delegated      Failed - Urine Drug Screen completed in last 360 days      Passed - Valid encounter within last 6 months    Recent Outpatient Visits           1 month ago [place], Donald E, MD   3 months ago Hypothyroidism, unspecified type   [facility] [name], MD   9 months ago Hypothyroidism, unspecified type   [facility] [name], MD   1 year ago Benign essential HTN   [facility] [name], MD   1 year ago Benign essential HTN   [facility] [name], MD       Future Appointments             In 2 months Fisher, Kirstie Peri, MD [facility], [place]

## 2021-10-13 NOTE — Telephone Encounter (Signed)
Medication Refill - Medication: HYDROcodone-acetaminophen (NORCO) 7.5-325 MG tablet  Has the patient contacted their pharmacy? No. (Agent: If no, request that the patient contact the pharmacy for the refill. If patient does not wish to contact the pharmacy document the reason why and proceed with request.) (Agent: If yes, when and what did the pharmacy advise?)  Preferred Pharmacy (with phone number or street name): [id number] - [place], [place] - [address]  [address], [place] [address]  Phone:  [phone]  Fax:  [phone]  Has the patient been seen for an appointment in the last year OR does the patient have an upcoming appointment? Yes.    Agent: Please be advised that RX refills may take up to 3 business days. We ask that you follow-up with your pharmacy.  

## 2021-10-14 MED ORDER — HYDROCODONE-ACETAMINOPHEN 7.5-325 MG PO TABS: 1.0000 | ORAL_TABLET | Freq: Four times a day (QID) | ORAL | 0 refills | Status: DC | PRN

## 2021-10-27 ENCOUNTER — Other Ambulatory Visit: Payer: Self-pay | Admitting: Family Medicine

## 2021-10-27 NOTE — Telephone Encounter (Signed)
[facility] faxed refill request for the following medications:  promethazine (PHENERGAN) 25 MG tablet   Please advise.

## 2021-10-27 NOTE — Telephone Encounter (Signed)
LOV:  08/25/2021  NOV: 12/28/2021  Last Refill:  03/17/2021  #30 5 Refills   Thanks,   -Mickel Baas

## 2021-10-28 MED ORDER — PROMETHAZINE HCL 25 MG PO TABS: 25.0000 mg | ORAL_TABLET | Freq: Four times a day (QID) | ORAL | 5 refills | Status: DC | PRN

## 2021-10-30 ENCOUNTER — Other Ambulatory Visit: Payer: Self-pay | Admitting: Family Medicine

## 2021-10-30 DIAGNOSIS — S149XXA Injury of unspecified nerves of neck, initial encounter: Secondary | ICD-10-CM

## 2021-10-30 MED ORDER — HYDROCODONE-ACETAMINOPHEN 7.5-325 MG PO TABS: 1.0000 | ORAL_TABLET | Freq: Four times a day (QID) | ORAL | 0 refills | Status: DC | PRN

## 2021-10-30 NOTE — Telephone Encounter (Signed)
LOV: 08/25/2021  NOV: 12/28/2021  Thanks,   -Mickel Baas

## 2021-10-30 NOTE — Telephone Encounter (Signed)
Copied from [place] [id number]. Topic: Quick Communication - Rx Refill/Question >> Oct 30, 2021  9:00 AM Yvette Rack wrote: Medication: HYDROcodone-acetaminophen ([place]) 7.5-325 MG tablet  Has the patient contacted their pharmacy? No. Pt previously told to contact provider (Agent: If no, request that the patient contact the pharmacy for the refill. If patient does not wish to contact the pharmacy document the reason why and proceed with request.) (Agent: If yes, when and what did the pharmacy advise?)  Preferred Pharmacy (with phone number or street name): [facility] #[id number] [address], [place]  Phone: [phone] Fax: [phone]  Has the patient been seen for an appointment in the last year OR does the patient have an upcoming appointment? Yes.    Agent: Please be advised that RX refills may take up to 3 business days. We ask that you follow-up with your pharmacy.

## 2021-10-30 NOTE — Telephone Encounter (Signed)
Requested medication (s) are due for refill today: yes?  Requested medication (s) are on the active medication list: yes  Last refill:  10/14/21 #30/0  Future visit scheduled: yes  Notes to clinic:  Unable to refill per protocol, cannot delegate.      Requested Prescriptions  Pending Prescriptions Disp Refills   HYDROcodone-acetaminophen (NORCO) 7.5-325 MG tablet 30 tablet 0    Sig: Take 1 tablet by mouth every 6 (six) hours as needed for moderate pain.     Not Delegated - Analgesics:  Opioid Agonist Combinations Failed - 10/30/2021 10:16 AM      Failed - This refill cannot be delegated      Failed - Urine Drug Screen completed in last 360 days      Passed - Valid encounter within last 6 months    Recent Outpatient Visits           2 months ago [place], Donald E, MD   3 months ago Hypothyroidism, unspecified type   [facility] [name], MD   9 months ago Hypothyroidism, unspecified type   [facility] [name], MD   1 year ago Benign essential HTN   [facility] [name], MD   1 year ago Benign essential HTN   [facility] [name], MD       Future Appointments             In 1 month Fisher, Kirstie Peri, MD [facility], [place]

## 2021-11-12 ENCOUNTER — Other Ambulatory Visit: Payer: Self-pay | Admitting: Family Medicine

## 2021-11-12 DIAGNOSIS — S149XXA Injury of unspecified nerves of neck, initial encounter: Secondary | ICD-10-CM

## 2021-11-12 NOTE — Telephone Encounter (Signed)
Medication Refill - Medication: Hydrocodone 7.5/325  Has the patient contacted their pharmacy? No.  They told her to always call the provider  (Agent: If no, request that the patient contact the pharmacy for the refill. If patient does not wish to contact the pharmacy document the reason why and proceed with request.) (Agent: If yes, when and what did the pharmacy advise?)  Preferred Pharmacy (with phone number or street name): [facility]  [address] Has the patient been seen for an appointment in the last year OR does the patient have an upcoming appointment? Yes.    Agent: Please be advised that RX refills may take up to 3 business days. We ask that you follow-up with your pharmacy.

## 2021-11-13 MED ORDER — HYDROCODONE-ACETAMINOPHEN 7.5-325 MG PO TABS: 1.0000 | ORAL_TABLET | Freq: Four times a day (QID) | ORAL | 0 refills | Status: DC | PRN

## 2021-11-26 ENCOUNTER — Other Ambulatory Visit: Payer: Self-pay | Admitting: Family Medicine

## 2021-11-26 DIAGNOSIS — S149XXA Injury of unspecified nerves of neck, initial encounter: Secondary | ICD-10-CM

## 2021-11-26 NOTE — Telephone Encounter (Signed)
Requested medication (s) are due for refill today: yes  Requested medication (s) are on the active medication list: yes    Last refill: 11/13/21  #30  0 refills  Future visit scheduled yes 12/28/21  Notes to clinic:Not delegated  Requested Prescriptions  Pending Prescriptions Disp Refills   HYDROcodone-acetaminophen (NORCO) 7.5-325 MG tablet 30 tablet 0    Sig: Take 1 tablet by mouth every 6 (six) hours as needed for moderate pain.     Not Delegated - Analgesics:  Opioid Agonist Combinations Failed - 11/26/2021  3:57 PM      Failed - This refill cannot be delegated      Failed - Urine Drug Screen completed in last 360 days      Failed - Valid encounter within last 3 months    Recent Outpatient Visits           3 months ago [place], Donald E, MD   4 months ago Hypothyroidism, unspecified type   [facility] [name], MD   10 months ago Hypothyroidism, unspecified type   [facility] [name], MD   1 year ago Benign essential HTN   [facility] [name], MD   1 year ago Benign essential HTN   [facility] [name], MD       Future Appointments             In 1 month Fisher, Kirstie Peri, MD [facility], [place]

## 2021-11-26 NOTE — Telephone Encounter (Signed)
Medication Refill - Medication: HYDROcodone-acetaminophen (NORCO) 7.5-325 MG tablet  Has the patient contacted their pharmacy? No. (She calls in every month  Preferred Pharmacy (with phone number or street name): [facility] #[id number] - [place], [place] Has the patient been seen for an appointment in the last year OR does the patient have an upcoming appointment? Yes.    Agent: Please be advised that RX refills may take up to 3 business days. We ask that you follow-up with your pharmacy.

## 2021-11-27 MED ORDER — HYDROCODONE-ACETAMINOPHEN 7.5-325 MG PO TABS: 1.0000 | ORAL_TABLET | Freq: Four times a day (QID) | ORAL | 0 refills | Status: DC | PRN

## 2021-12-10 ENCOUNTER — Other Ambulatory Visit: Payer: Self-pay | Admitting: Family Medicine

## 2021-12-10 DIAGNOSIS — S149XXA Injury of unspecified nerves of neck, initial encounter: Secondary | ICD-10-CM

## 2021-12-10 NOTE — Telephone Encounter (Signed)
Medication Refill - Medication:  HYDROcodone-acetaminophen (NORCO) 7.5-325 MG tablet 30 tablet 0 11/27/2021    Sig - Route: Take 1 tablet by mouth every 6 (six) hours as needed for moderate pain. - Oral   Sent to pharmacy as: HYDROcodone-acetaminophen ([place]) 7.5-325 MG tablet   Earliest Fill Date: 11/27/2021   E-Prescribing Status: Receipt confirmed by pharmacy (11/27/2021  7:41 AM EST     Has the patient contacted their pharmacy? Yes.   (Agent: If no, request that the patient contact the pharmacy for the refill. If patient does not wish to contact the pharmacy document the reason why and proceed with request.) (Agent: If yes, when and what did the pharmacy advise?) call dr  Preferred Pharmacy (with phone number or street name):  [facility] #[id number] [address], [place] - [place]  [place] [address]  Phone: [phone] Fax: [phone]   Has the patient been seen for an appointment in the last year OR does the patient have an upcoming appointment? Yes.   Nxt 12/28/21  Agent: Please be advised that RX refills may take up to 3 business days. We ask that you follow-up with your pharmacy.

## 2021-12-10 NOTE — Telephone Encounter (Signed)
Requested medication (s) are due for refill today: yes  Requested medication (s) are on the active medication list: yes    Last refill: 11/27/21  #30  0 refills  Future visit scheduled yes 12/28/21  Notes to clinic:not delegated  Requested Prescriptions  Pending Prescriptions Disp Refills   HYDROcodone-acetaminophen (NORCO) 7.5-325 MG tablet 30 tablet 0    Sig: Take 1 tablet by mouth every 6 (six) hours as needed for moderate pain.     Not Delegated - Analgesics:  Opioid Agonist Combinations Failed - 12/10/2021 11:21 AM      Failed - This refill cannot be delegated      Failed - Urine Drug Screen completed in last 360 days      Failed - Valid encounter within last 3 months    Recent Outpatient Visits           3 months ago [place], Jamie E, MD   5 months ago Hypothyroidism, unspecified type   [facility] [name], MD   11 months ago Hypothyroidism, unspecified type   [facility] [name], MD   1 year ago Benign essential HTN   [facility] [name], MD   1 year ago Benign essential HTN   [facility] [name], MD       Future Appointments             In 2 weeks Fisher, Jamie Peri, MD [facility], [place]

## 2021-12-11 MED ORDER — HYDROCODONE-ACETAMINOPHEN 7.5-325 MG PO TABS: 1.0000 | ORAL_TABLET | Freq: Four times a day (QID) | ORAL | 0 refills | Status: DC | PRN

## 2021-12-24 ENCOUNTER — Other Ambulatory Visit: Payer: Self-pay | Admitting: Family Medicine

## 2021-12-24 DIAGNOSIS — S149XXA Injury of unspecified nerves of neck, initial encounter: Secondary | ICD-10-CM

## 2021-12-24 MED ORDER — HYDROCODONE-ACETAMINOPHEN 7.5-325 MG PO TABS: 1.0000 | ORAL_TABLET | Freq: Four times a day (QID) | ORAL | 0 refills | Status: DC | PRN

## 2021-12-24 NOTE — Telephone Encounter (Signed)
Medication Refill - Medication: HYDROcodone-acetaminophen (NORCO) 7.5-325 MG  ? ? ? ?Has the patient contacted their pharmacy? No. ?(Agent: If no, request that the patient contact the pharmacy for the refill. If patient does not wish to contact the pharmacy document the reason why and proceed with request.) ?(Agent: If yes, when and what did the pharmacy advise?) ? ?Preferred Pharmacy (with phone number or street name): [facility] #[id number] [address], [place]  ?[address] [place], [place] [address]  ?Phone:  [phone]  Fax:  [phone]  ?Has the patient been seen for an appointment in the last year OR does the patient have an upcoming appointment? Yes.   ? ?Agent: Please be advised that RX refills may take up to 3 business days. We ask that you follow-up with your pharmacy. ?

## 2021-12-24 NOTE — Telephone Encounter (Signed)
Requested medication (s) are due for refill today: signed 12/11/21 ? ?Requested medication (s) are on the active medication list: yes ? ?Last refill:  12/11/21 #30 0 refills ? ?Future visit scheduled: yes in 4 days ? ?Notes to clinic:  not delegated per protocol, patient requesting refill. ? ? ?  ?Requested Prescriptions  ?Pending Prescriptions Disp Refills  ? HYDROcodone-acetaminophen (NORCO) 7.5-325 MG tablet 30 tablet 0  ?  Sig: Take 1 tablet by mouth every 6 (six) hours as needed for moderate pain.  ?  ? Not Delegated - Analgesics:  Opioid Agonist Combinations Failed - 12/24/2021  2:51 PM  ?  ?  Failed - This refill cannot be delegated  ?  ?  Failed - Urine Drug Screen completed in last 360 days  ?  ?  Failed - Valid encounter within last 3 months  ?  Recent Outpatient Visits   ? ?      ? 4 months ago COVID-19  ? [facility] [name], MD  ? 5 months ago Hypothyroidism, unspecified type  ? [facility] [name], MD  ? 11 months ago Hypothyroidism, unspecified type  ? [facility] Caryn Section, Kirstie Peri, MD  ? 1 year ago Benign essential HTN  ? [facility] Caryn Section, Kirstie Peri, MD  ? 1 year ago Benign essential HTN  ? [facility] Caryn Section, Kirstie Peri, MD  ? ?  ?  ?Future Appointments   ? ?        ? In 4 days Fisher, Kirstie Peri, MD [facility], [facility]  ? ?  ? ?  ?  ?  ? ?

## 2021-12-28 ENCOUNTER — Encounter: Payer: Self-pay | Admitting: Family Medicine

## 2021-12-28 ENCOUNTER — Ambulatory Visit (INDEPENDENT_AMBULATORY_CARE_PROVIDER_SITE_OTHER): Payer: BC Managed Care – PPO | Admitting: Family Medicine

## 2021-12-28 ENCOUNTER — Other Ambulatory Visit: Payer: Self-pay

## 2021-12-28 VITALS — BP 142/82 | HR 76 | Temp 98.0°F | Resp 16 | Ht 65.0 in | Wt 180.2 lb

## 2021-12-28 DIAGNOSIS — F32A Depression, unspecified: Secondary | ICD-10-CM

## 2021-12-28 DIAGNOSIS — R7303 Prediabetes: Secondary | ICD-10-CM

## 2021-12-28 DIAGNOSIS — I10 Essential (primary) hypertension: Secondary | ICD-10-CM | POA: Diagnosis not present

## 2021-12-28 LAB — POCT GLYCOSYLATED HEMOGLOBIN (HGB A1C): Hemoglobin A1C: 5.6 % (ref 4.0–5.6)

## 2021-12-28 MED ORDER — HYDROCHLOROTHIAZIDE 25 MG PO TABS: 25.0000 mg | ORAL_TABLET | Freq: Every day | ORAL | 5 refills | Status: DC

## 2021-12-28 MED ORDER — PAROXETINE HCL 30 MG PO TABS: 60.0000 mg | ORAL_TABLET | Freq: Every day | ORAL | 1 refills | Status: DC

## 2021-12-28 MED ORDER — AMLODIPINE BESYLATE 10 MG PO TABS: 10.0000 mg | ORAL_TABLET | Freq: Every day | ORAL | 11 refills | Status: DC

## 2021-12-28 NOTE — Patient Instructions (Addendum)
Please review the attached list of medications and notify my office if there are any errors.  ? ?Please call the [facility] [phone]) to schedule a routine screening mammogram.  ? ?You are due for a follow up at [facility] with Dr. Glennon Mac ?

## 2021-12-28 NOTE — Progress Notes (Signed)
?  ? ? ?Established patient visit ? ? ?Patient: Jamie Williams   DOB: 07-Jun-1961   61 y.o. Female  MRN: [id number] ?Visit Date: 12/28/2021 ? ?Today's healthcare provider: Lelon Huh, MD  ? ?Chief Complaint  ?Patient presents with  ? Hyperlipidemia  ? Hypertension  ? Diabetes  ? ?Subjective  ?  ?HPI  ?PreDiabetes Mellitus , follow-up ? ?Lab Results  ?Component Value Date  ? HGBA1C 5.6 12/28/2021  ? HGBA1C 6.1 (H) 07/03/2021  ? HGBA1C 6.0 (H) 01/07/2021  ? ?Last seen for diabetes 3-6 months ago.  ?Management since then includes continuing the same treatment. ?She reports excellent compliance with treatment. ?She is not having side effects.  ? ?Home blood sugar records:  not being checked ? ?Episodes of hypoglycemia? No  ? ?--------------------------------------------------------------------------------------------------- ?Hypertension, follow-up ? ?BP Readings from Last 3 Encounters:  ?12/28/21 (!) 142/82  ?08/22/21 (!) 145/93  ?07/03/21 139/77  ? Wt Readings from Last 3 Encounters:  ?12/28/21 180 lb 3.2 oz (81.7 kg)  ?08/22/21 180 lb (81.6 kg)  ?07/03/21 180 lb (81.6 kg)  ?  ? ?She was last seen for hypertension 3-6 months ago.  ?BP at that visit was 139/77. ?Management since that visit includes continue current medications. ?She reports excellent compliance with treatment. ?She is not having side effects.  ?She is not exercising. ?She is not adherent to low salt diet.   ?Outside blood pressures are 140's/80's. ? ?She does smoke. ? ?--------------------------------------------------------------------------------------------------- ?Lipid/Cholesterol, follow-up ? ?Last Lipid Panel: ?Lab Results  ?Component Value Date  ? CHOL 245 (H) 07/03/2021  ? LDLCALC 163 (H) 07/03/2021  ? HDL 66 07/03/2021  ? TRIG 92 07/03/2021  ? ? ?She was last seen for this 3-6 months ago.  ?Management since that visit includes Increase Pravastatin to 40 mg once a day. ? ?She reports excellent compliance with treatment. ?She is not having  side effects.  ? ?Symptoms: ?No appetite changes No foot ulcerations  ?No chest pain No chest pressure/discomfort  ?No dyspnea No orthopnea  ?No fatigue No lower extremity edema  ?No palpitations No paroxysmal nocturnal dyspnea  ?No nausea No numbness or tingling of extremity  ?No polydipsia No polyuria  ?No speech difficulty No syncope  ? ?She is following a Regular diet. ?Current exercise: none ? ?Last metabolic panel ?Lab Results  ?Component Value Date  ? GLUCOSE 118 (H) 07/03/2021  ? NA 142 07/03/2021  ? K 4.7 07/03/2021  ? BUN 11 07/03/2021  ? CREATININE 0.95 07/03/2021  ? EGFR 69 07/03/2021  ? GFRNONAA 66 10/06/2020  ? CALCIUM 10.6 (H) 07/03/2021  ? AST 16 07/03/2021  ? ALT 17 07/03/2021  ? ?The 10-year ASCVD risk score (Arnett DK, et al., 2019) is: 17.7% ? ?---------------------------------------------------------------------------------------------------  ? ?Medications: ?Outpatient Medications Prior to Visit  ?Medication Sig  ? albuterol (VENTOLIN HFA) 108 (90 Base) MCG/ACT inhaler Inhale 2 puffs into the lungs every 6 (six) hours as needed for wheezing or shortness of breath.  ? amLODipine (NORVASC) 10 MG tablet Take 1 tablet (10 mg total) by mouth daily.  ? buPROPion (WELLBUTRIN SR) 150 MG 12 hr tablet Take 1 tablet (150 mg total) by mouth 2 (two) times daily.  ? cholecalciferol (VITAMIN D) 1000 units tablet Take 2,000 Units by mouth daily.   ? estradiol (ESTRACE) 2 MG tablet Take 1 tablet (2 mg total) by mouth daily.  ? gabapentin (NEURONTIN) 300 MG capsule TAKE 1 CAPSULE BY MOUTH THREE TIMES A DAY  ? hydrochlorothiazide (HYDRODIURIL) 25 MG tablet Take  1 tablet (25 mg total) by mouth daily.  ? HYDROcodone-acetaminophen (NORCO) 7.5-325 MG tablet Take 1 tablet by mouth every 6 (six) hours as needed for moderate pain.  ? levothyroxine (SYNTHROID) 50 MCG tablet TAKE 1 TABLET(50 MCG) BY MOUTH DAILY  ? meloxicam (MOBIC) 15 MG tablet Take 1 tablet (15 mg total) by mouth daily. with food  ?  nirmatrelvir/ritonavir EUA (PAXLOVID) 20 x 150 MG & 10 x 100MG TABS GFR is > 60. Take nirmatrelvir (150 mg) 2 tab PO BID for 5 days and ritonavir (100 mg) 1 tab  PO daily for 5 days.  ? ondansetron (ZOFRAN ODT) 4 MG disintegrating tablet Take 1 tablet (4 mg total) by mouth every 8 (eight) hours as needed for nausea or vomiting.  ? PARoxetine (PAXIL) 30 MG tablet Take 2 tablets (60 mg total) by mouth daily.  ? pravastatin (PRAVACHOL) 40 MG tablet Take 1 tablet (40 mg total) by mouth daily.  ? promethazine (PHENERGAN) 25 MG tablet Take 1 tablet (25 mg total) by mouth every 6 (six) hours as needed for nausea or vomiting.  ? ranitidine (ZANTAC) 150 MG tablet RANITIDINE HCL, 150MG (Oral Tablet) ? 1 Two Times A Day for heartburn, indigestion, nausea for 0 days ? Quantity: 60.00;  Refills: 5 ? ? Ordered :22-Jul-2010 ? Reginia Forts MD;  Started 28-Jan-2010 ?Active Comments: DX: 787.02  ? ?No facility-administered medications prior to visit.  ? ? ?Review of Systems ? ? ?  Objective  ?  ?BP (!) 142/82 (BP Location: Left Arm, Patient Position: Sitting, Cuff Size: Large)   Pulse 76   Temp 98 ?F (36.7 ?C) (Oral)   Resp 16   Ht _0  (1.651 m)   Wt 180 lb 3.2 oz (81.7 kg)   BMI 29.99 kg/m?  ? ? ?Physical Exam  ? ?General: Appearance:     ?Well developed, well nourished female in no acute distress  ?Eyes:    PERRL, conjunctiva/corneas clear, EOM's intact       ?Lungs:     Clear to auscultation bilaterally, respirations unlabored  ?Heart:    Normal heart rate. Normal rhythm. No murmurs, rubs, or gallops.    ?MS:   All extremities are intact.    ?Neurologic:   Awake, alert, oriented x 3. No apparent focal neurological defect.   ?   ?  ? ?Results for orders placed or performed in visit on 12/28/21  ?POCT glycosylated hemoglobin (Hb A1C)  ?Result Value Ref Range  ? Hemoglobin A1C 5.6 4.0 - 5.6 %  ? ? Assessment & Plan  ?  ?1. Pre-diabetes ?Diet controlled.  ? ?2. Benign essential HTN ?Not at goal. She states she thinks she is  taking hctz but refill has not been ordered in over a year and last dispense history was for 30 supply on May 6th, 2022 ?- hydrochlorothiazide (HYDRODIURIL) 25 MG tablet; Take 1 tablet (25 mg total) by mouth daily.  Dispense: 30 tablet; Refill: 5 ? ?refill amLODipine (NORVASC) 10 MG tablet; Take 1 tablet (10 mg total) by mouth daily.  Dispense: 60 tablet; Refill: 11 ? ?3. Depression, unspecified depression type ?Doing well on current dose paroxetine which was refilled today.  ?- PARoxetine (PAXIL) 30 MG tablet; Take 2 tablets (60 mg total) by mouth daily.  Dispense: 180 tablet; Refill: 1  ? ?Future Appointments  ?Date Time Provider [place]  ?04/30/2022 11:00 AM Caryn Section Kirstie Peri, MD [facility]  ?  ?   ? ?The entirety of the information documented  in the History of Present Illness, Review of Systems and Physical Exam were personally obtained by me. Portions of this information were initially documented by the CMA and reviewed by me for thoroughness and accuracy.   ? ? ?Lelon Huh, MD  ?[facility] ?[phone] (phone) ?[phone] (fax) ? ?[facility]  ?

## 2022-01-08 ENCOUNTER — Other Ambulatory Visit: Payer: Self-pay | Admitting: Family Medicine

## 2022-01-08 DIAGNOSIS — I10 Essential (primary) hypertension: Secondary | ICD-10-CM

## 2022-01-08 DIAGNOSIS — S149XXA Injury of unspecified nerves of neck, initial encounter: Secondary | ICD-10-CM

## 2022-01-08 MED ORDER — HYDROCODONE-ACETAMINOPHEN 7.5-325 MG PO TABS: 1.0000 | ORAL_TABLET | Freq: Four times a day (QID) | ORAL | 0 refills | Status: DC | PRN

## 2022-01-08 NOTE — Telephone Encounter (Signed)
Copied from [place] [id number]. Topic: Quick Communication - Rx Refill/Question ?>> Jan 08, 2022  8:54 AM Tessa Lerner A wrote: ?Medication: hydrochlorothiazide (HYDRODIURIL) 25 MG tablet [[id number]]  ? ?Has the patient contacted their pharmacy? No. ?(Agent: If no, request that the patient contact the pharmacy for the refill. If patient does not wish to contact the pharmacy document the reason why and proceed with request.) ?(Agent: If yes, when and what did the pharmacy advise?) ? ?Preferred Pharmacy (with phone number or street name): [facility] #[id number] [address], [place] ?[place] [address] ?Phone: [phone] Fax: [phone] ?Hours: Not open 24 hours ? ?Has the patient been seen for an appointment in the last year OR does the patient have an upcoming appointment? Yes.   ? ?Agent: Please be advised that RX refills may take up to 3 business days. We ask that you follow-up with your pharmacy. ?

## 2022-01-08 NOTE — Telephone Encounter (Signed)
Patient called and advised Hydrochlorothiazide is available at the pharmacy. She says I needed my Hydrocodone refilled. Advised this will be be sent to the provider. ?

## 2022-01-08 NOTE — Telephone Encounter (Signed)
Requested medication (s) are due for refill today - unsure ? ?Requested medication (s) are on the active medication list -yes ? ?Future visit scheduled -yes ? ?Last refill: 12/24/20 #30 ? ?Notes to clinic: Request RF: non delegated Rx ? ?Requested Prescriptions  ?Pending Prescriptions Disp Refills  ? HYDROcodone-acetaminophen (NORCO) 7.5-325 MG tablet 30 tablet 0  ?  Sig: Take 1 tablet by mouth every 6 (six) hours as needed for moderate pain.  ?  ? Not Delegated - Analgesics:  Opioid Agonist Combinations Failed - 01/08/2022  9:50 AM  ?  ?  Failed - This refill cannot be delegated  ?  ?  Failed - Urine Drug Screen completed in last 360 days  ?  ?  Passed - Valid encounter within last 3 months  ?  Recent Outpatient Visits   ? ?      ? 1 week ago Pre-diabetes  ? [facility] Caryn Section, Kirstie Peri, MD  ? 4 months ago COVID-19  ? [facility] [name], MD  ? 6 months ago Hypothyroidism, unspecified type  ? [facility] [name], MD  ? 1 year ago Hypothyroidism, unspecified type  ? [facility] Caryn Section, Kirstie Peri, MD  ? 1 year ago Benign essential HTN  ? [facility] Caryn Section, Kirstie Peri, MD  ? ?  ?  ?Future Appointments   ? ?        ? In 3 months Fisher, Kirstie Peri, MD [facility], [facility]  ? ?  ? ?  ?  ?  ? ? ? ?Requested Prescriptions  ?Pending Prescriptions Disp Refills  ? HYDROcodone-acetaminophen (NORCO) 7.5-325 MG tablet 30 tablet 0  ?  Sig: Take 1 tablet by mouth every 6 (six) hours as needed for moderate pain.  ?  ? Not Delegated - Analgesics:  Opioid Agonist Combinations Failed - 01/08/2022  9:50 AM  ?  ?  Failed - This refill cannot be delegated  ?  ?  Failed - Urine Drug Screen completed in last 360 days  ?  ?  Passed - Valid encounter within last 3 months  ?  Recent Outpatient Visits   ? ?      ? 1 week ago Pre-diabetes  ? [facility] Caryn Section, Kirstie Peri, MD  ? 4 months ago COVID-19  ? [facility]  [name], MD  ? 6 months ago Hypothyroidism, unspecified type  ? [facility] [name], MD  ? 1 year ago Hypothyroidism, unspecified type  ? [facility] Caryn Section, Kirstie Peri, MD  ? 1 year ago Benign essential HTN  ? [facility] Caryn Section, Kirstie Peri, MD  ? ?  ?  ?Future Appointments   ? ?        ? In 3 months Fisher, Kirstie Peri, MD [facility], [facility]  ? ?  ? ?  ?  ?  ? ? ? ?

## 2022-01-19 ENCOUNTER — Other Ambulatory Visit: Payer: Self-pay | Admitting: Family Medicine

## 2022-01-19 DIAGNOSIS — Z1231 Encounter for screening mammogram for malignant neoplasm of breast: Secondary | ICD-10-CM

## 2022-01-22 ENCOUNTER — Other Ambulatory Visit: Payer: Self-pay | Admitting: Family Medicine

## 2022-01-22 DIAGNOSIS — S149XXA Injury of unspecified nerves of neck, initial encounter: Secondary | ICD-10-CM

## 2022-01-22 NOTE — Telephone Encounter (Signed)
Requested medications are due for refill today.  yes ? ?Requested medications are on the active medications list.  yes ? ?Last refill. 01/08/2022 #30 0 refills ? ?Future visit scheduled.   yes ? ?Notes to clinic.  Medication refill not delegated. ? ? ? ?Requested Prescriptions  ?Pending Prescriptions Disp Refills  ? HYDROcodone-acetaminophen (NORCO) 7.5-325 MG tablet 30 tablet 0  ?  Sig: Take 1 tablet by mouth every 6 (six) hours as needed for moderate pain.  ?  ? Not Delegated - Analgesics:  Opioid Agonist Combinations Failed - 01/22/2022 12:08 PM  ?  ?  Failed - This refill cannot be delegated  ?  ?  Failed - Urine Drug Screen completed in last 360 days  ?  ?  Passed - Valid encounter within last 3 months  ?  Recent Outpatient Visits   ? ?      ? 3 weeks ago Pre-diabetes  ? [facility] Caryn Section, Kirstie Peri, MD  ? 5 months ago COVID-19  ? [facility] [name], MD  ? 6 months ago Hypothyroidism, unspecified type  ? [facility] [name], MD  ? 1 year ago Hypothyroidism, unspecified type  ? [facility] Caryn Section, Kirstie Peri, MD  ? 1 year ago Benign essential HTN  ? [facility] Caryn Section, Kirstie Peri, MD  ? ?  ?  ?Future Appointments   ? ?        ? In 3 months Fisher, Kirstie Peri, MD [facility]  ? ?  ? ?  ?  ?  ?  ?

## 2022-01-22 NOTE — Telephone Encounter (Signed)
Copied from [place] [id number]. Topic: Quick Communication - Rx Refill/Question ?>> Jan 22, 2022  9:07 AM Tessa Lerner A wrote: ?Medication: HYDROcodone-acetaminophen (NORCO) 7.5-325 MG tablet [[id number]]  ? ?Has the patient contacted their pharmacy? No. ?(Agent: If no, request that the patient contact the pharmacy for the refill. If patient does not wish to contact the pharmacy document the reason why and proceed with request.) ?(Agent: If yes, when and what did the pharmacy advise?) ? ?Preferred Pharmacy (with phone number or street name): [facility] #[id number] [address], [place] ?[place] [address] ?Phone: [phone] Fax: [phone] ?Hours: Not open 24 hours ? ?Has the patient been seen for an appointment in the last year OR does the patient have an upcoming appointment? Yes.   ? ?Agent: Please be advised that RX refills may take up to 3 business days. We ask that you follow-up with your pharmacy. ?

## 2022-01-23 MED ORDER — HYDROCODONE-ACETAMINOPHEN 7.5-325 MG PO TABS: 1.0000 | ORAL_TABLET | Freq: Four times a day (QID) | ORAL | 0 refills | Status: DC | PRN

## 2022-01-28 ENCOUNTER — Other Ambulatory Visit: Payer: Self-pay | Admitting: Family Medicine

## 2022-01-29 ENCOUNTER — Other Ambulatory Visit: Payer: Self-pay | Admitting: Family Medicine

## 2022-01-29 DIAGNOSIS — E039 Hypothyroidism, unspecified: Secondary | ICD-10-CM

## 2022-02-05 ENCOUNTER — Other Ambulatory Visit: Payer: Self-pay | Admitting: Family Medicine

## 2022-02-05 DIAGNOSIS — S149XXA Injury of unspecified nerves of neck, initial encounter: Secondary | ICD-10-CM

## 2022-02-05 NOTE — Telephone Encounter (Signed)
Requested medication (s) are due for refill today:   Provider to review ? ?Requested medication (s) are on the active medication list:   Yes ? ?Future visit scheduled:   Yes ? ? ?Last ordered: 01/23/2022 #30, 0 refills ? ?Non delegated refill  ? ?Requested Prescriptions  ?Pending Prescriptions Disp Refills  ? HYDROcodone-acetaminophen (NORCO) 7.5-325 MG tablet 30 tablet 0  ?  Sig: Take 1 tablet by mouth every 6 (six) hours as needed for moderate pain.  ?  ? Not Delegated - Analgesics:  Opioid Agonist Combinations Failed - 02/05/2022  9:04 AM  ?  ?  Failed - This refill cannot be delegated  ?  ?  Failed - Urine Drug Screen completed in last 360 days  ?  ?  Passed - Valid encounter within last 3 months  ?  Recent Outpatient Visits   ? ?      ? 1 month ago Pre-diabetes  ? [facility] Caryn Section, Kirstie Peri, MD  ? 5 months ago COVID-19  ? [facility] [name], MD  ? 7 months ago Hypothyroidism, unspecified type  ? [facility] [name], MD  ? 1 year ago Hypothyroidism, unspecified type  ? [facility] Caryn Section, Kirstie Peri, MD  ? 1 year ago Benign essential HTN  ? [facility] Caryn Section, Kirstie Peri, MD  ? ?  ?  ?Future Appointments   ? ?        ? In 2 months Fisher, Kirstie Peri, MD [facility]  ? ?  ? ? ?  ?  ?  ? ?

## 2022-02-05 NOTE — Telephone Encounter (Signed)
Medication Refill - Medication:  ?HYDROcodone-acetaminophen (NORCO) 7.5-325 MG tablet  ? ?Has the patient contacted their pharmacy? Yes.   ?Contact PCP ? ?Preferred Pharmacy (with phone number or street name):  ?[facility] #[id number] [address], [place] [address] [place]  ?[address] [place], [place] [address]  ?Phone:  [phone]  Fax:  [phone]  ? ?Has the patient been seen for an appointment in the last year OR does the patient have an upcoming appointment? Yes.   ? ?Agent: Please be advised that RX refills may take up to 3 business days. We ask that you follow-up with your pharmacy. ?

## 2022-02-06 MED ORDER — HYDROCODONE-ACETAMINOPHEN 7.5-325 MG PO TABS: 1.0000 | ORAL_TABLET | Freq: Four times a day (QID) | ORAL | 0 refills | Status: DC | PRN

## 2022-02-19 ENCOUNTER — Other Ambulatory Visit: Payer: Self-pay | Admitting: Family Medicine

## 2022-02-19 DIAGNOSIS — S149XXA Injury of unspecified nerves of neck, initial encounter: Secondary | ICD-10-CM

## 2022-02-19 NOTE — Telephone Encounter (Signed)
Medication Refill - Medication:  ?HYDROcodone-acetaminophen (NORCO) 7.5-325 MG tablet ? ?Has the patient contacted their pharmacy? Yes.   ?Contact PCP ? ?Preferred Pharmacy (with phone number or street name):  ?[facility]ORE #[id number] [address], [place] [address] [place]  ?[address] [place], [place] [address]  ?Phone:  [phone]  Fax:  [phone]  ? ?Has the patient been seen for an appointment in the last year OR does the patient have an upcoming appointment?  ? ?Agent: Please be advised that RX refills may take up to 3 business days. We ask that you follow-up with your pharmacy. ?

## 2022-02-19 NOTE — Telephone Encounter (Signed)
Requested medication (s) are due for refill today - unsure ? ?Requested medication (s) are on the active medication list -yes ? ?Future visit scheduled -yes ? ?Last refill: 02/06/22 #30 ? ?Notes to clinic: Request RF: non delegated Rx ? ?Requested Prescriptions  ?Pending Prescriptions Disp Refills  ? HYDROcodone-acetaminophen (NORCO) 7.5-325 MG tablet 30 tablet 0  ?  Sig: Take 1 tablet by mouth every 6 (six) hours as needed for moderate pain.  ?  ? Not Delegated - Analgesics:  Opioid Agonist Combinations Failed - 02/19/2022  1:10 PM  ?  ?  Failed - This refill cannot be delegated  ?  ?  Failed - Urine Drug Screen completed in last 360 days  ?  ?  Passed - Valid encounter within last 3 months  ?  Recent Outpatient Visits   ? ?      ? 1 month ago Pre-diabetes  ? [facility] Caryn Section, Kirstie Peri, MD  ? 5 months ago COVID-19  ? [facility] [name], MD  ? 7 months ago Hypothyroidism, unspecified type  ? [facility] [name], MD  ? 1 year ago Hypothyroidism, unspecified type  ? [facility] Caryn Section, Kirstie Peri, MD  ? 1 year ago Benign essential HTN  ? [facility] Caryn Section, Kirstie Peri, MD  ? ?  ?  ?Future Appointments   ? ?        ? In 2 months Fisher, Kirstie Peri, MD [facility]  ? ?  ? ? ?  ?  ?  ? ? ? ?Requested Prescriptions  ?Pending Prescriptions Disp Refills  ? HYDROcodone-acetaminophen (NORCO) 7.5-325 MG tablet 30 tablet 0  ?  Sig: Take 1 tablet by mouth every 6 (six) hours as needed for moderate pain.  ?  ? Not Delegated - Analgesics:  Opioid Agonist Combinations Failed - 02/19/2022  1:10 PM  ?  ?  Failed - This refill cannot be delegated  ?  ?  Failed - Urine Drug Screen completed in last 360 days  ?  ?  Passed - Valid encounter within last 3 months  ?  Recent Outpatient Visits   ? ?      ? 1 month ago Pre-diabetes  ? [facility] Caryn Section, Kirstie Peri, MD  ? 5 months ago COVID-19  ? [facility]  [name], MD  ? 7 months ago Hypothyroidism, unspecified type  ? [facility] [name], MD  ? 1 year ago Hypothyroidism, unspecified type  ? [facility] Caryn Section, Kirstie Peri, MD  ? 1 year ago Benign essential HTN  ? [facility] Caryn Section, Kirstie Peri, MD  ? ?  ?  ?Future Appointments   ? ?        ? In 2 months Fisher, Kirstie Peri, MD [facility]  ? ?  ? ? ?  ?  ?  ? ? ? ?

## 2022-02-21 MED ORDER — HYDROCODONE-ACETAMINOPHEN 7.5-325 MG PO TABS: 1.0000 | ORAL_TABLET | Freq: Four times a day (QID) | ORAL | 0 refills | Status: DC | PRN

## 2022-02-24 ENCOUNTER — Ambulatory Visit
Admission: RE | Admit: 2022-02-24 | Discharge: 2022-02-24 | Disposition: A | Discharge status: Home / Self Care | Payer: BC Managed Care – PPO | Source: Ambulatory Visit | Attending: Family Medicine | Admitting: Family Medicine

## 2022-02-24 DIAGNOSIS — Z1231 Encounter for screening mammogram for malignant neoplasm of breast: Secondary | ICD-10-CM | POA: Insufficient documentation

## 2022-03-05 ENCOUNTER — Other Ambulatory Visit: Payer: Self-pay | Admitting: Family Medicine

## 2022-03-05 DIAGNOSIS — S149XXA Injury of unspecified nerves of neck, initial encounter: Secondary | ICD-10-CM

## 2022-03-05 NOTE — Telephone Encounter (Signed)
Medication Refill - Medication: HYDROcodone-acetaminophen (NORCO) 7.5-325 MG tablet  ? ?Has the patient contacted their pharmacy? No. ? ?(Agent: If yes, when and what did the pharmacy advise?) controlled substance  ? ?Preferred Pharmacy (with phone number or street name):  ?[facility] #[id number] [address], [place] [address] [place] Phone:  [phone]  ?Fax:  [phone]  ?  ? ?Has the patient been seen for an appointment in the last year OR does the patient have an upcoming appointment? Yes.   ? ?Agent: Please be advised that RX refills may take up to 3 business days. We ask that you follow-up with your pharmacy. ? ?

## 2022-03-08 MED ORDER — HYDROCODONE-ACETAMINOPHEN 7.5-325 MG PO TABS: 1.0000 | ORAL_TABLET | Freq: Four times a day (QID) | ORAL | 0 refills | Status: DC | PRN

## 2022-03-08 NOTE — Telephone Encounter (Signed)
Pt would like to know if her medication will be sent in, pt would like a call back.  ?

## 2022-03-08 NOTE — Telephone Encounter (Signed)
Requested medication (s) are due for refill today: Yes ? ?Requested medication (s) are on the active medication list: Yes ? ?Last refill:  02/21/22 ? ?Future visit scheduled: Yes ? ?Notes to clinic:  See request. ? ? ? ?Requested Prescriptions  ?Pending Prescriptions Disp Refills  ? HYDROcodone-acetaminophen (NORCO) 7.5-325 MG tablet 30 tablet 0  ?  Sig: Take 1 tablet by mouth every 6 (six) hours as needed for moderate pain.  ?  ? Not Delegated - Analgesics:  Opioid Agonist Combinations Failed - 03/05/2022 10:24 AM  ?  ?  Failed - This refill cannot be delegated  ?  ?  Failed - Urine Drug Screen completed in last 360 days  ?  ?  Passed - Valid encounter within last 3 months  ?  Recent Outpatient Visits   ? ?      ? 2 months ago Pre-diabetes  ? [facility] Caryn Section, Kirstie Peri, MD  ? 6 months ago COVID-19  ? [facility] [name], MD  ? 8 months ago Hypothyroidism, unspecified type  ? [facility] [name], MD  ? 1 year ago Hypothyroidism, unspecified type  ? [facility] Caryn Section, Kirstie Peri, MD  ? 1 year ago Benign essential HTN  ? [facility] Caryn Section, Kirstie Peri, MD  ? ?  ?  ?Future Appointments   ? ?        ? In 1 month Fisher, Kirstie Peri, MD [facility]  ? ?  ? ? ?  ?  ?  ? ?

## 2022-03-19 ENCOUNTER — Other Ambulatory Visit: Payer: Self-pay | Admitting: Family Medicine

## 2022-03-19 DIAGNOSIS — I10 Essential (primary) hypertension: Secondary | ICD-10-CM

## 2022-03-19 DIAGNOSIS — S149XXA Injury of unspecified nerves of neck, initial encounter: Secondary | ICD-10-CM

## 2022-03-19 NOTE — Telephone Encounter (Signed)
Medication Refill - Medication: HYDROcodone-acetaminophen (NORCO) 7.5-325 MG tablet  Has the patient contacted their pharmacy? No. (Agent: If no, request that the patient contact the pharmacy for the refill. If patient does not wish to contact the pharmacy document the reason why and proceed with request.) (Agent: If yes, when and what did the pharmacy advise?)  Preferred Pharmacy (with phone number or street name): [facility] #[id number] - [place], [place] - [address]  [address], [place] [address]  Phone:  [phone]  Fax:  [phone]  Has the patient been seen for an appointment in the last year OR does the patient have an upcoming appointment? Yes.    Agent: Please be advised that RX refills may take up to 3 business days. We ask that you follow-up with your pharmacy.  

## 2022-03-19 NOTE — Telephone Encounter (Signed)
Requested medication (s) are due for refill today - provider review   Requested medication (s) are on the active medication list -yes  Future visit scheduled -yes  Last refill: 03/08/22 #30  Notes to clinic: non delegated Rx  Requested Prescriptions  Pending Prescriptions Disp Refills   HYDROcodone-acetaminophen (NORCO) 7.5-325 MG tablet 30 tablet 0    Sig: Take 1 tablet by mouth every 6 (six) hours as needed for moderate pain.     Not Delegated - Analgesics:  Opioid Agonist Combinations Failed - 03/19/2022  1:09 PM      Failed - This refill cannot be delegated      Failed - Urine Drug Screen completed in last 360 days      Passed - Valid encounter within last 3 months    Recent Outpatient Visits           2 months ago [place], Donald E, MD   6 months ago [place], Donald E, MD   8 months ago Hypothyroidism, unspecified type   [facility] [name], MD   1 year ago Hypothyroidism, unspecified type   [facility] [name], MD   1 year ago Benign essential HTN   [facility] [name], MD       Future Appointments             In 1 month Fisher, Kirstie Peri, MD [facility]                Requested Prescriptions  Pending Prescriptions Disp Refills   HYDROcodone-acetaminophen (NORCO) 7.5-325 MG tablet 30 tablet 0    Sig: Take 1 tablet by mouth every 6 (six) hours as needed for moderate pain.     Not Delegated - Analgesics:  Opioid Agonist Combinations Failed - 03/19/2022  1:09 PM      Failed - This refill cannot be delegated      Failed - Urine Drug Screen completed in last 360 days      Passed - Valid encounter within last 3 months    Recent Outpatient Visits           2 months ago [place], Donald E, MD   6 months ago [place], Donald E, MD   8 months ago Hypothyroidism, unspecified type   [facility] [name], MD   1 year ago Hypothyroidism, unspecified type   [facility] [name], MD   1 year ago Benign essential HTN   [facility] [name], MD       Future Appointments             In 1 month Fisher, Kirstie Peri, MD [facility] [place]

## 2022-03-22 MED ORDER — HYDROCODONE-ACETAMINOPHEN 7.5-325 MG PO TABS: 1.0000 | ORAL_TABLET | Freq: Four times a day (QID) | ORAL | 0 refills | Status: DC | PRN

## 2022-03-26 ENCOUNTER — Telehealth: Payer: Self-pay | Admitting: Family Medicine

## 2022-03-26 NOTE — Telephone Encounter (Signed)
Copied from [place] [id number]. Topic: Medicare AWV >> Mar 26, 2022 10:35 AM Cher Nakai R wrote: Reason for CRM:  Left message for patient to call back and schedule Medicare Annual Wellness Visit (AWV) in office.   If unable to come into the office for AWV,  please offer to do virtually or by telephone.  Last AWV: 07/07/2020  Please schedule at anytime with [facility] Health Advisor.  30 minute appointment for Virtual or phone 45 minute appointment for in office or Initial virtual/phone  Any questions, please contact me at [phone]

## 2022-03-30 ENCOUNTER — Ambulatory Visit (INDEPENDENT_AMBULATORY_CARE_PROVIDER_SITE_OTHER): Payer: BC Managed Care – PPO | Admitting: Family Medicine

## 2022-03-30 ENCOUNTER — Other Ambulatory Visit (HOSPITAL_COMMUNITY)
Admission: RE | Admit: 2022-03-30 | Discharge: 2022-03-30 | Disposition: A | Discharge status: Home / Self Care | Payer: BC Managed Care – PPO | Source: Ambulatory Visit | Attending: Family Medicine | Admitting: Family Medicine

## 2022-03-30 ENCOUNTER — Encounter: Payer: Self-pay | Admitting: Family Medicine

## 2022-03-30 VITALS — BP 120/82 | Temp 97.1°F | Ht 65.0 in | Wt 181.4 lb

## 2022-03-30 DIAGNOSIS — Z01419 Encounter for gynecological examination (general) (routine) without abnormal findings: Secondary | ICD-10-CM | POA: Insufficient documentation

## 2022-03-30 DIAGNOSIS — Z1151 Encounter for screening for human papillomavirus (HPV): Secondary | ICD-10-CM | POA: Insufficient documentation

## 2022-03-30 DIAGNOSIS — N9089 Other specified noninflammatory disorders of vulva and perineum: Secondary | ICD-10-CM

## 2022-03-30 DIAGNOSIS — Z124 Encounter for screening for malignant neoplasm of cervix: Secondary | ICD-10-CM | POA: Insufficient documentation

## 2022-03-30 DIAGNOSIS — R87618 Other abnormal cytological findings on specimens from cervix uteri: Secondary | ICD-10-CM

## 2022-03-30 NOTE — Progress Notes (Signed)
GYNECOLOGY ANNUAL PREVENTATIVE CARE ENCOUNTER NOTE  Subjective:   Jamie Williams is a 61 y.o. [place] female [id number] here for a routine annual gynecologic exam.  Current complaints: "I am worried about the HPV".     S/p supracervical hysterectomy in 2011. Colpo in 2020 due to NIL, HPV positive- subtype 16-- no dysplasia (koiliocytic changes). Missed paps since 2020. Confirmed with patient she has not had a pap at other locations  Performs self breast exams  She was nervous about cancer and hence her lapse of nearly 3 years since her pap. She reports her brothers died of liver and pancreatic cancer (2015 and 2019) and her sister of breast cancer (2016).   She continues to smoke 1/2 ppd.   Denies abnormal vaginal bleeding, discharge, pelvic pain, problems with intercourse or other gynecologic concerns.    Gynecologic History No LMP recorded. Patient has had a hysterectomy. Contraception: status post hysterectomy-- SUPRACERVICAL Last Pap: 2020- NIL, HPV POS 2019-- NIL, HPV POS (16) Last mammogram: 02/24/22. Results were: normal  Health Maintenance Due  Topic Date Due   Zoster Vaccines- Shingrix (1 of 2) Never done   TETANUS/TDAP  01/29/2020   COVID-19 Vaccine (3 - Booster for Pfizer series) 05/06/2020   PAP SMEAR-Modifier  07/12/2020    The following portions of the patient's history were reviewed and updated as appropriate: allergies, current medications, past family history, past medical history, past social history, past surgical history and problem list.  Review of Systems Pertinent items are noted in HPI.   Objective:  BP 120/82   Temp (!) 97.1 F (36.2 C) (Oral)   Ht '5\' 5"'$  (1.651 m)   Wt 181 lb 6.4 oz (82.3 kg)   BMI 30.19 kg/m  CONSTITUTIONAL: Well-developed, well-nourished female in no acute distress.  HENT:  Normocephalic, atraumatic, External right and left ear normal. Oropharynx is clear and moist EYES:  No scleral icterus.  NECK: Normal range of motion, supple, no  masses.  Normal thyroid.  SKIN: Skin is warm and dry. No rash noted. Not diaphoretic. No erythema. No pallor. NEUROLOGIC: Alert and oriented to person, place, and time. Normal reflexes, muscle tone coordination. No cranial nerve deficit noted. PSYCHIATRIC: Normal mood and affect. Normal behavior. Normal judgment and thought content. CARDIOVASCULAR: Normal heart rate noted, regular rhythm. 2+ distal pulses. RESPIRATORY: Effort and breath sounds normal, no problems with respiration noted. BREASTS: Symmetric in size. Linear striations on the breasts bilaterally, hyperpigmented linear areas. One of these on the left and multiple on right (present for 4 yr per patient, not changing) No masses, nipple drainage, or lymphadenopathy. ABDOMEN: Soft,  no distention noted.  No tenderness, rebound or guarding.  PELVIC: Normal appearing external genitalia except for 0.5cm verrucal appearing normally pigmented lesion on the right lower labia majora (present for years, does get irritated occasionally). ; Mildly atrophic vaginal mucosa and cervix. No abnormal discharge noted.  Pap smear obtained.  Normal uterine size, no other palpable masses, no uterine or adnexal tenderness. MUSCULOSKELETAL: Normal range of motion.     Assessment and Plan:  1) Annual gynecologic examination with pap smear:  Will follow up results of pap smear and manage accordingly.   Routine preventative health maintenance measures emphasized. Reviewed perimenopausal symptoms and management.   1. Pap smear abnormality of cervix/human papillomavirus (HPV) positive - Cytology - PAP - Anticipated high likelihood of needing colposcopy given 3 years without pap  2. Cervical cancer screening - Cytology - PAP  3. Well woman exam with routine gynecological  exam - Cytology - PAP  4. Vulvar lesion Likely verruca/HPV. Discussed removal and recommended given location and likelihood of irritation    Please refer to After Visit Summary for other  counseling recommendations.   No follow-ups on file.  Caren Macadam, MD, MPH, ABFM Attending Physician [facility]

## 2022-04-01 LAB — CYTOLOGY - PAP
Comment: NEGATIVE
Diagnosis: NEGATIVE
High risk HPV: NEGATIVE

## 2022-04-08 ENCOUNTER — Other Ambulatory Visit: Payer: Self-pay | Admitting: Family Medicine

## 2022-04-08 DIAGNOSIS — S149XXA Injury of unspecified nerves of neck, initial encounter: Secondary | ICD-10-CM

## 2022-04-08 NOTE — Telephone Encounter (Signed)
Copied from [place] [id number]. Topic: General - Other >> Apr 08, 2022  9:41 AM [place] C wrote: Reason for CRM: Medication Refill - Medication: HYDROcodone-acetaminophen (NORCO) 7.5-325 MG tablet [[id number]]   Has the patient contacted their pharmacy? No. (Agent: If no, request that the patient contact the pharmacy for the refill. If patient does not wish to contact the pharmacy document the reason why and proceed with request.) (Agent: If yes, when and what did the pharmacy advise?)  Preferred Pharmacy (with phone number or street name): [facility] #[id number] [address], [place] [address] Phone: [phone] Fax: [phone] Hours: Not open 24 hours   Has the patient been seen for an appointment in the last year OR does the patient have an upcoming appointment? Yes.    Agent: Please be advised that RX refills may take up to 3 business days. We ask that you follow-up with your pharmacy.

## 2022-04-08 NOTE — Telephone Encounter (Signed)
Requested medication (s) are due for refill today: yes  Requested medication (s) are on the active medication list: yes  Last refill:  03/22/22 #30 with 0 RF  Future visit scheduled: 04/30/22  Notes to clinic:  This medication can not be delegated, please assess.        Requested Prescriptions  Pending Prescriptions Disp Refills   HYDROcodone-acetaminophen (NORCO) 7.5-325 MG tablet 30 tablet 0    Sig: Take 1 tablet by mouth every 6 (six) hours as needed for moderate pain.     Not Delegated - Analgesics:  Opioid Agonist Combinations Failed - 04/08/2022  4:11 PM      Failed - This refill cannot be delegated      Failed - Urine Drug Screen completed in last 360 days      Failed - Valid encounter within last 3 months    Recent Outpatient Visits           3 months ago [place], Donald E, MD   7 months ago [place], Donald E, MD   9 months ago Hypothyroidism, unspecified type   [facility] [name], MD   1 year ago Hypothyroidism, unspecified type   [facility] [name], MD   1 year ago Benign essential HTN   [facility] [name], MD       Future Appointments             In 3 weeks Fisher, Kirstie Peri, MD [facility] [place]

## 2022-04-09 MED ORDER — HYDROCODONE-ACETAMINOPHEN 7.5-325 MG PO TABS: 1.0000 | ORAL_TABLET | Freq: Four times a day (QID) | ORAL | 0 refills | Status: DC | PRN

## 2022-04-13 ENCOUNTER — Ambulatory Visit (INDEPENDENT_AMBULATORY_CARE_PROVIDER_SITE_OTHER): Payer: BC Managed Care – PPO | Admitting: Family Medicine

## 2022-04-13 ENCOUNTER — Other Ambulatory Visit (HOSPITAL_COMMUNITY)
Admission: RE | Admit: 2022-04-13 | Discharge: 2022-04-13 | Disposition: A | Discharge status: Home / Self Care | Payer: BC Managed Care – PPO | Source: Ambulatory Visit | Attending: Obstetrics | Admitting: Obstetrics

## 2022-04-13 ENCOUNTER — Encounter: Payer: Self-pay | Admitting: Family Medicine

## 2022-04-13 ENCOUNTER — Ambulatory Visit: Payer: BC Managed Care – PPO | Admitting: Obstetrics

## 2022-04-13 VITALS — BP 128/78 | Ht 65.0 in | Wt 181.0 lb

## 2022-04-13 DIAGNOSIS — N9089 Other specified noninflammatory disorders of vulva and perineum: Secondary | ICD-10-CM | POA: Diagnosis not present

## 2022-04-13 NOTE — Progress Notes (Signed)
Skin tag removal R side

## 2022-04-13 NOTE — Progress Notes (Signed)
   GYNECOLOGY PROBLEM  VISIT ENCOUNTER NOTE  Subjective:   JANAYA BROY is a 61 y.o. [phone] female here for a problem GYN visit.  Current complaints: labial lesion that causes irritation.   Denies abnormal vaginal bleeding, discharge, pelvic pain, problems with intercourse or other gynecologic concerns.    Gynecologic History No LMP recorded. Patient has had a hysterectomy.  Health Maintenance Due  Topic Date Due   Zoster Vaccines- Shingrix (1 of 2) Never done   TETANUS/TDAP  01/29/2020   COVID-19 Vaccine (3 - Pfizer series) 05/06/2020    The following portions of the patient's history were reviewed and updated as appropriate: allergies, current medications, past family history, past medical history, past social history, past surgical history and problem list.  Review of Systems Pertinent items are noted in HPI.   Objective:  BP 128/78   Ht '5\' 5"'$  (1.651 m)   Wt 181 lb (82.1 kg)   BMI 30.12 kg/m  Gen: well appearing, NAD HEENT: no scleral icterus CV: RR Lung: Normal WOB Ext: warm well perfused  PELVIC: Normal appearing external genitalia; normal appearing vaginal mucosa and cervix.  No abnormal discharge noted.    Skin Tag Removal: Verbal and Written consent obtained Area was cleaned with alcohol and 1 cc of 2% lidocaine was infiltrated at the base of the lesion. Area was then prepped with betadine. After confirming the area had adequate anesthesia, using 11 blade lesion was removed in its entirety and sent to pathology. The defect was closed with 3-0 vicryl in a running fashion and steri strips were placed for the dressing. Minimal bleeding noted.    Assessment and Plan:   1. Labial lesion - Surgical pathology   Please refer to After Visit Summary for other counseling recommendations.   No follow-ups on file.  Caren Macadam, MD, MPH, ABFM Attending [place] for [facility]

## 2022-04-15 LAB — SURGICAL PATHOLOGY

## 2022-04-16 ENCOUNTER — Other Ambulatory Visit: Payer: Self-pay | Admitting: Family Medicine

## 2022-04-16 DIAGNOSIS — N951 Menopausal and female climacteric states: Secondary | ICD-10-CM

## 2022-04-20 ENCOUNTER — Telehealth: Payer: Self-pay

## 2022-04-23 ENCOUNTER — Other Ambulatory Visit: Payer: Self-pay | Admitting: Family Medicine

## 2022-04-23 DIAGNOSIS — S149XXA Injury of unspecified nerves of neck, initial encounter: Secondary | ICD-10-CM

## 2022-04-23 MED ORDER — HYDROCODONE-ACETAMINOPHEN 7.5-325 MG PO TABS: 1.0000 | ORAL_TABLET | Freq: Four times a day (QID) | ORAL | 0 refills | Status: DC | PRN

## 2022-04-23 NOTE — Telephone Encounter (Signed)
Medication Refill - Medication: HYDROcodone-acetaminophen (NORCO) 7.5-325 MG tablet  Has the patient contacted their pharmacy? No.  Preferred Pharmacy (with phone number or street name):  [facility] #[id number] [address], [place] Phone:  [phone]  Fax:  [phone]     Has the patient been seen for an appointment in the last year OR does the patient have an upcoming appointment? Yes.    Agent: Please be advised that RX refills may take up to 3 business days. We ask that you follow-up with your pharmacy.  Encourage patient to call pharmacy for refills in future

## 2022-04-23 NOTE — Telephone Encounter (Signed)
Requested medication (s) are due for refill today: yes  Requested medication (s) are on the active medication list: yes  Last refill:  04/09/22 #30/0  Future visit scheduled: yes  Notes to clinic:  Unable to refill per protocol, cannot delegate.    Requested Prescriptions  Pending Prescriptions Disp Refills   HYDROcodone-acetaminophen (NORCO) 7.5-325 MG tablet 30 tablet 0    Sig: Take 1 tablet by mouth every 6 (six) hours as needed for moderate pain.     Not Delegated - Analgesics:  Opioid Agonist Combinations Failed - 04/23/2022  9:27 AM      Failed - This refill cannot be delegated      Failed - Urine Drug Screen completed in last 360 days      Failed - Valid encounter within last 3 months    Recent Outpatient Visits           3 months ago [place], Donald E, MD   8 months ago [place], Donald E, MD   9 months ago Hypothyroidism, unspecified type   [facility] [name], MD   1 year ago Hypothyroidism, unspecified type   [facility] [name], MD   1 year ago Benign essential HTN   [facility] [name], MD       Future Appointments             In 1 week Fisher, Kirstie Peri, MD [facility], [place]

## 2022-04-29 NOTE — Progress Notes (Unsigned)
I,[name],acting as a scribe for Lelon Huh, MD.,have documented all relevant documentation on the behalf of Lelon Huh, MD,as directed by  Lelon Huh, MD while in the presence of Lelon Huh, MD.    Established patient visit   Patient: Jamie Williams   DOB: 1961-02-07   61 y.o. Female  MRN: [id number] Visit Date: 04/30/2022  Today's healthcare provider: Lelon Huh, MD   Chief Complaint  Patient presents with   Hypertension   Depression   Subjective    HPI  Hypertension, follow-up  BP Readings from Last 3 Encounters:  04/30/22 (!) 176/96  04/13/22 128/78  03/30/22 120/82   Wt Readings from Last 3 Encounters:  04/30/22 182 lb (82.6 kg)  04/13/22 181 lb (82.1 kg)  03/30/22 181 lb 6.4 oz (82.3 kg)     She was last seen for hypertension 4 months ago.  BP at that visit was 142/82. Management since that visit includes advising patient to continue same medications.  She reports good compliance with treatment. She is not having side effects.  She is following a Regular diet. She is not exercising. She does not smoke.  Use of agents associated with hypertension: none.   Symptoms: No chest pain No chest pressure  No palpitations No syncope  No dyspnea No orthopnea  No paroxysmal nocturnal dyspnea No lower extremity edema   Pertinent labs Lab Results  Component Value Date   CHOL 245 (H) 07/03/2021   HDL 66 07/03/2021   LDLCALC 163 (H) 07/03/2021   TRIG 92 07/03/2021   CHOLHDL 3.7 07/03/2021   Lab Results  Component Value Date   NA 142 07/03/2021   K 4.7 07/03/2021   CREATININE 0.95 07/03/2021   EGFR 69 07/03/2021   GLUCOSE 118 (H) 07/03/2021   TSH 3.590 07/03/2021     The 10-year ASCVD risk score (Arnett DK, et al., 2019) is: 31.3%  ---------------------------------------------------------------------------------------------------   Depression, Follow-up  She  was last seen for this 4 months ago. Changes made at last visit  include none; continue same medication.   She reports good compliance with treatment. She is not having side effects.   She reports excellent tolerance of treatment. She feels she is Improved since last visit.     04/30/2022   11:33 AM 12/28/2021    8:51 AM 07/03/2021    9:47 AM  Depression screen PHQ 2/9  Decreased Interest 0 0 0  Down, Depressed, Hopeless 0 0 0  PHQ - 2 Score 0 0 0  Altered sleeping 1  1  Tired, decreased energy 0  0  Change in appetite 1  1  Feeling bad or failure about yourself  0  0  Trouble concentrating 0  0  Moving slowly or fidgety/restless 0  0  Suicidal thoughts 0  0  PHQ-9 Score 2  2  Difficult doing work/chores Not difficult at all  Not difficult at all    -----------------------------------------------------------------------------------------   Medications: Outpatient Medications Prior to Visit  Medication Sig   albuterol (VENTOLIN HFA) 108 (90 Base) MCG/ACT inhaler Inhale 2 puffs into the lungs every 6 (six) hours as needed for wheezing or shortness of breath.   amLODipine (NORVASC) 10 MG tablet Take 1 tablet (10 mg total) by mouth daily.   buPROPion (WELLBUTRIN SR) 150 MG 12 hr tablet Take 1 tablet (150 mg total) by mouth 2 (two) times daily.   cholecalciferol (VITAMIN D) 1000 units tablet Take 2,000 Units by mouth daily.  estradiol (ESTRACE) 2 MG tablet TAKE 1 TABLET(2 MG) BY MOUTH DAILY   gabapentin (NEURONTIN) 300 MG capsule TAKE 1 CAPSULE BY MOUTH THREE TIMES A DAY   hydrochlorothiazide (HYDRODIURIL) 25 MG tablet Take 1 tablet (25 mg total) by mouth daily.   HYDROcodone-acetaminophen (NORCO) 7.5-325 MG tablet Take 1 tablet by mouth every 6 (six) hours as needed for moderate pain.   levothyroxine (SYNTHROID) 50 MCG tablet TAKE 1 TABLET(50 MCG) BY MOUTH DAILY   meloxicam (MOBIC) 15 MG tablet TAKE 1 TABLET(15 MG) BY MOUTH DAILY WITH FOOD   PARoxetine (PAXIL) 30 MG tablet Take 2 tablets (60 mg total) by mouth daily.   pravastatin (PRAVACHOL) 40  MG tablet Take 1 tablet (40 mg total) by mouth daily.   promethazine (PHENERGAN) 25 MG tablet Take 1 tablet (25 mg total) by mouth every 6 (six) hours as needed for nausea or vomiting.   ranitidine (ZANTAC) 150 MG tablet RANITIDINE HCL, 150MG (Oral Tablet)  1 Two Times A Day for heartburn, indigestion, nausea for 0 days  Quantity: 60.00;  Refills: 5   Ordered :22-Jul-2010  Reginia Forts MD;  Buddy Duty 28-Jan-2010 Active Comments: DX: 787.02   No facility-administered medications prior to visit.    Review of Systems  Constitutional:  Negative for appetite change, chills, fatigue and fever.  Respiratory:  Negative for chest tightness and shortness of breath.   Cardiovascular:  Negative for chest pain and palpitations.  Gastrointestinal:  Negative for abdominal pain, nausea and vomiting.  Neurological:  Negative for dizziness and weakness.       Objective    BP (!) 176/96 (BP Location: Left Arm, Patient Position: Sitting, Cuff Size: Large)   Pulse 65   Temp 97.8 F (36.6 C) (Oral)   Resp 14   Wt 182 lb (82.6 kg)   SpO2 100% Comment: room air  BMI 30.29 kg/m    Today's Vitals   04/30/22 1103 04/30/22 1112  BP: (!) 178/92 (!) 176/96  Pulse: 65   Resp: 14   Temp: 97.8 F (36.6 C)   TempSrc: Oral   SpO2: 100%   Weight: 182 lb (82.6 kg)    Body mass index is 30.29 kg/m.   Physical Exam    General: Appearance:    Mildly obese female in no acute distress  Eyes:    PERRL, conjunctiva/corneas clear, EOM's intact       Lungs:     Clear to auscultation bilaterally, respirations unlabored  Heart:    Normal heart rate. Normal rhythm. No murmurs, rubs, or gallops.    MS:   All extremities are intact.    Neurologic:   Awake, alert, oriented x 3. No apparent focal neurological defect.         Assessment & Plan     1. Benign essential HTN Doing well current medications, but did not take this morning. Was much better at her gyn visit last month. She does states that her BP  tends to run high before she takes meds in the morning.   Will have her change amlodipine to the evening instead of morning.    2. Aortic atherosclerosis ([place]) She is tolerating pravastatin well with no adverse effects.  Refill pravastatin (PRAVACHOL) 40 MG tablet; Take 1 tablet (40 mg total) by mouth daily.  Dispense: 90 tablet; Refill: 4  3. Left hand paresthesia refill - gabapentin (NEURONTIN) 300 MG capsule; TAKE 1 CAPSULE BY MOUTH THREE TIMES A DAY  Dispense: 270 capsule; Refill: 3  4. Prescription for  Tdap. Vaccine not administered in office.   - Tdap ([place]) 5-2.5-18.5 LF-MCG/0.5 injection; Inject 0.5 mLs into the muscle once for 1 dose.  Dispense: 0.5 mL; Refill: 0   Future Appointments  Date Time Provider [place]  08/30/2022  8:40 AM [name], MD [facility]   Labs at follow up visit.      The entirety of the information documented in the History of Present Illness, Review of Systems and Physical Exam were personally obtained by me. Portions of this information were initially documented by the CMA and reviewed by me for thoroughness and accuracy.     Lelon Huh, MD  [facility] [phone] (phone) [phone] (fax)  [place]

## 2022-04-30 ENCOUNTER — Encounter: Payer: Self-pay | Admitting: Family Medicine

## 2022-04-30 ENCOUNTER — Ambulatory Visit (INDEPENDENT_AMBULATORY_CARE_PROVIDER_SITE_OTHER): Payer: BC Managed Care – PPO | Admitting: Family Medicine

## 2022-04-30 VITALS — BP 176/96 | HR 65 | Temp 97.8°F | Resp 14 | Wt 182.0 lb

## 2022-04-30 DIAGNOSIS — I10 Essential (primary) hypertension: Secondary | ICD-10-CM

## 2022-04-30 DIAGNOSIS — I700 Atherosclerosis of aorta: Secondary | ICD-10-CM

## 2022-04-30 DIAGNOSIS — R202 Paresthesia of skin: Secondary | ICD-10-CM | POA: Diagnosis not present

## 2022-04-30 DIAGNOSIS — Z23 Encounter for immunization: Secondary | ICD-10-CM

## 2022-04-30 MED ORDER — GABAPENTIN 300 MG PO CAPS: ORAL_CAPSULE | ORAL | 3 refills | Status: DC

## 2022-04-30 MED ORDER — PRAVASTATIN SODIUM 40 MG PO TABS: 40.0000 mg | ORAL_TABLET | Freq: Every day | ORAL | 4 refills | Status: DC

## 2022-04-30 MED ORDER — TETANUS-DIPHTH-ACELL PERTUSSIS 5-2.5-18.5 LF-MCG/0.5 IM SUSY: 0.5000 mL | PREFILLED_SYRINGE | Freq: Once | INTRAMUSCULAR | 0 refills | Status: AC

## 2022-04-30 MED ORDER — AMLODIPINE BESYLATE 10 MG PO TABS: 10.0000 mg | ORAL_TABLET | Freq: Every evening | ORAL | Status: DC

## 2022-04-30 NOTE — Patient Instructions (Signed)
Please review the attached list of medications and notify my office if there are any errors.   Please bring all of your medications to every appointment so we can make sure that our medication list is the same as yours.   Start taking amlodipine in the evening with your other medications. Continue taking the hctz in the morning

## 2022-05-10 ENCOUNTER — Telehealth: Payer: Self-pay | Admitting: Family Medicine

## 2022-05-10 NOTE — Telephone Encounter (Signed)
Copied from [place]. Topic: Medicare AWV >> May 10, 2022  1:26 PM Jae Dire wrote: Reason for CRM:  Left message for patient to call back and schedule Medicare Annual Wellness Visit (AWV) in office.   If unable to come into the office for AWV,  please offer to do virtually or by telephone.  Last AWV: 07/07/2020  Please schedule at anytime with [facility] Health Advisor.  30 minute appointment for Virtual or phone 45 minute appointment for in office or Initial virtual/phone  Any questions, please contact me at [phone]

## 2022-05-12 ENCOUNTER — Other Ambulatory Visit: Payer: Self-pay | Admitting: Family Medicine

## 2022-05-12 DIAGNOSIS — S149XXA Injury of unspecified nerves of neck, initial encounter: Secondary | ICD-10-CM

## 2022-05-12 NOTE — Telephone Encounter (Signed)
Medication Refill - Medication: HYDROcodone-acetaminophen (NORCO) 7.5-325 MG tablet  Pt stated she is out of medication.   Has the patient contacted their pharmacy? No. No, more refills.   (Agent: If no, request that the patient contact the pharmacy for the refill. If patient does not wish to contact the pharmacy document the reason why and proceed with request.)   Preferred Pharmacy (with phone number or street name):  [facility][id number] [address], [place]  [place] [address]  Phone: [phone] Fax: [phone]  Hours: Not open 24 hours   Has the patient been seen for an appointment in the last year OR does the patient have an upcoming appointment? Yes.    Agent: Please be advised that RX refills may take up to 3 business days. We ask that you follow-up with your pharmacy.

## 2022-05-13 NOTE — Telephone Encounter (Signed)
Requested medications are due for refill today.  yes  Requested medications are on the active medications list.  yes  Last refill. 03/27/2022 #30 0 refills  Future visit scheduled.   yes  Notes to clinic.  Refill not delegated.    Requested Prescriptions  Pending Prescriptions Disp Refills   HYDROcodone-acetaminophen (NORCO) 7.5-325 MG tablet 30 tablet 0    Sig: Take 1 tablet by mouth every 6 (six) hours as needed for moderate pain.     Not Delegated - Analgesics:  Opioid Agonist Combinations Failed - 05/12/2022  4:16 PM      Failed - This refill cannot be delegated      Failed - Urine Drug Screen completed in last 360 days      Passed - Valid encounter within last 3 months    Recent Outpatient Visits           1 week ago Benign essential HTN   [facility] [name], MD   4 months ago Pre-diabetes   [facility] [name], MD   8 months ago [place], Donald E, MD   10 months ago Hypothyroidism, unspecified type   [facility] [name], MD   1 year ago Hypothyroidism, unspecified type   [facility] [name], MD       Future Appointments             In 3 months Fisher, Kirstie Peri, MD [facility], [place]

## 2022-05-14 MED ORDER — HYDROCODONE-ACETAMINOPHEN 7.5-325 MG PO TABS: 1.0000 | ORAL_TABLET | Freq: Four times a day (QID) | ORAL | 0 refills | Status: DC | PRN

## 2022-05-18 ENCOUNTER — Other Ambulatory Visit: Payer: Self-pay | Admitting: Family Medicine

## 2022-05-28 ENCOUNTER — Other Ambulatory Visit: Payer: Self-pay | Admitting: Family Medicine

## 2022-05-28 DIAGNOSIS — S149XXA Injury of unspecified nerves of neck, initial encounter: Secondary | ICD-10-CM

## 2022-05-28 MED ORDER — HYDROCODONE-ACETAMINOPHEN 7.5-325 MG PO TABS: 1.0000 | ORAL_TABLET | Freq: Four times a day (QID) | ORAL | 0 refills | Status: DC | PRN

## 2022-05-28 NOTE — Telephone Encounter (Signed)
Medication Refill - Medication: HYDROcodone-acetaminophen (NORCO) 7.5-325 MG tablet   Has the patient contacted their pharmacy? no because of they type of med (Agent: If no, request that the patient contact the pharmacy for the refill. If patient does not wish to contact the pharmacy document the reason why and proceed with request.) (Agent: If yes, when and what did the pharmacy advise?)contact pcp  Preferred Pharmacy (with phone number or street name):  [facility] #[id number] [address], [place] Phone:  [phone]  Fax:  [phone]     Has the patient been seen for an appointment in the last year OR does the patient have an upcoming appointment? yes  Agent: Please be advised that RX refills may take up to 3 business days. We ask that you follow-up with your pharmacy.

## 2022-05-28 NOTE — Telephone Encounter (Signed)
Requested medication (s) are due for refill today: yes  Requested medication (s) are on the active medication list: yes  Last refill:  05/14/22 #30/0  Future visit scheduled: yes  Notes to clinic:  Unable to refill per protocol, cannot delegate.      Requested Prescriptions  Pending Prescriptions Disp Refills   HYDROcodone-acetaminophen (NORCO) 7.5-325 MG tablet 30 tablet 0    Sig: Take 1 tablet by mouth every 6 (six) hours as needed for moderate pain.     Not Delegated - Analgesics:  Opioid Agonist Combinations Failed - 05/28/2022  9:55 AM      Failed - This refill cannot be delegated      Failed - Urine Drug Screen completed in last 360 days      Passed - Valid encounter within last 3 months    Recent Outpatient Visits           4 weeks ago Benign essential HTN   [facility] [name], MD   5 months ago [place], Donald E, MD   9 months ago [place], Donald E, MD   10 months ago Hypothyroidism, unspecified type   [facility] [name], MD   1 year ago Hypothyroidism, unspecified type   [facility] [name], MD       Future Appointments             In 3 months Fisher, Kirstie Peri, MD [facility], [place]

## 2022-06-01 ENCOUNTER — Telehealth: Payer: Self-pay | Admitting: Family Medicine

## 2022-06-01 NOTE — Telephone Encounter (Signed)
Copied from [place] [id number]. Topic: Medicare AWV >> Jun 01, 2022 10:40 AM Jae Dire wrote: Reason for CRM:  Left message for patient to call back and schedule Medicare Annual Wellness Visit (AWV) in office.   If unable to come into the office for AWV,  please offer to do virtually or by telephone.  Last AWV: 07/07/2020  Please schedule at anytime with [facility] Health Advisor.  30 minute appointment for Virtual or phone 45 minute appointment for in office or Initial virtual/phone  Any questions, please contact me at [phone]

## 2022-06-11 ENCOUNTER — Other Ambulatory Visit: Payer: Self-pay | Admitting: Family Medicine

## 2022-06-11 DIAGNOSIS — S149XXA Injury of unspecified nerves of neck, initial encounter: Secondary | ICD-10-CM

## 2022-06-11 NOTE — Telephone Encounter (Signed)
Called pharmacy to verify status of hydrocodone Rx. Spoke with pharmacy staff and verified patient picked up Rx on 05/28/22.

## 2022-06-11 NOTE — Telephone Encounter (Signed)
Requested medication (s) are due for refill today: no  Requested medication (s) are on the active medication list: yes  Last refill:  05/28/22 #30 0 refills  Future visit scheduled: yes in 2 months  Notes to clinic:  not delegated per protocol. Patient requesting refill. NT called pharmacy and staff reports patient picked up Rx on 05/28/22. Do you want to refill Rx?     Requested Prescriptions  Pending Prescriptions Disp Refills   HYDROcodone-acetaminophen (NORCO) 7.5-325 MG tablet 30 tablet 0    Sig: Take 1 tablet by mouth every 6 (six) hours as needed for moderate pain.     Not Delegated - Analgesics:  Opioid Agonist Combinations Failed - 06/11/2022  9:54 AM      Failed - This refill cannot be delegated      Failed - Urine Drug Screen completed in last 360 days      Passed - Valid encounter within last 3 months    Recent Outpatient Visits           1 month ago Benign essential HTN   [facility] [name], MD   5 months ago [place], Donald E, MD   9 months ago [place], Donald E, MD   11 months ago Hypothyroidism, unspecified type   [facility] [name], MD   1 year ago Hypothyroidism, unspecified type   [facility] [name], MD       Future Appointments             In 2 months Fisher, Kirstie Peri, MD [facility], [place]

## 2022-06-11 NOTE — Telephone Encounter (Signed)
Medication Refill - Medication: HYDROcodone-acetaminophen (NORCO) 7.5-325 MG tablet   Has the patient contacted their pharmacy? No. (Agent: If no, request that the patient contact the pharmacy for the refill. If patient does not wish to contact the pharmacy document the reason why and proceed with request.) (Agent: If yes, when and what did the pharmacy advise?)  Preferred Pharmacy (with phone number or street name): [facility] #[id number] [address], [place] [address]  [place], [place] [address]  Phone:  [phone]  Fax:  [phone]  DEA #:  [id number] Has the patient been seen for an appointment in the last year OR does the patient have an upcoming appointment? Yes.    Agent: Please be advised that RX refills may take up to 3 business days. We ask that you follow-up with your pharmacy.

## 2022-06-13 MED ORDER — HYDROCODONE-ACETAMINOPHEN 7.5-325 MG PO TABS: 1.0000 | ORAL_TABLET | Freq: Four times a day (QID) | ORAL | 0 refills | Status: DC | PRN

## 2022-07-01 ENCOUNTER — Other Ambulatory Visit: Payer: Self-pay | Admitting: Family Medicine

## 2022-07-01 DIAGNOSIS — S149XXA Injury of unspecified nerves of neck, initial encounter: Secondary | ICD-10-CM

## 2022-07-01 NOTE — Telephone Encounter (Signed)
Medication Refill - Medication: HYDROcodone-acetaminophen (NORCO) 7.5-325 MG tablet   Has the patient contacted their pharmacy? No. Pt previously told to contact provider  Preferred Pharmacy (with phone number or street name):  [facility] #[id number] [address], [place] Phone:  [phone]  Fax:  [phone]     Has the patient been seen for an appointment in the last year OR does the patient have an upcoming appointment? Yes.    Agent: Please be advised that RX refills may take up to 3 business days. We ask that you follow-up with your pharmacy.

## 2022-07-02 MED ORDER — HYDROCODONE-ACETAMINOPHEN 7.5-325 MG PO TABS: 1.0000 | ORAL_TABLET | Freq: Four times a day (QID) | ORAL | 0 refills | Status: DC | PRN

## 2022-07-02 NOTE — Telephone Encounter (Signed)
Requested medication (s) are due for refill today:no  Requested medication (s) are on the active medication list: yes  Last refill:  06/13/22  Future visit scheduled: yes  Notes to clinic:  Unable to refill per protocol, cannot delegate.      Requested Prescriptions  Pending Prescriptions Disp Refills   HYDROcodone-acetaminophen (NORCO) 7.5-325 MG tablet 30 tablet 0    Sig: Take 1 tablet by mouth every 6 (six) hours as needed for moderate pain.     Not Delegated - Analgesics:  Opioid Agonist Combinations Failed - 07/01/2022 11:28 AM      Failed - This refill cannot be delegated      Failed - Urine Drug Screen completed in last 360 days      Passed - Valid encounter within last 3 months    Recent Outpatient Visits           2 months ago Benign essential HTN   [facility] [name], MD   6 months ago [place], Donald E, MD   10 months ago [place], Donald E, MD   12 months ago Hypothyroidism, unspecified type   [facility] [name], MD   1 year ago Hypothyroidism, unspecified type   [facility] [name], MD       Future Appointments             In 1 month Fisher, Kirstie Peri, MD [facility] [place]

## 2022-07-05 ENCOUNTER — Telehealth: Payer: Self-pay | Admitting: Family Medicine

## 2022-07-05 NOTE — Telephone Encounter (Signed)
Copied from [place] [id number]. Topic: Medicare AWV >> Jul 05, 2022  2:31 PM Jae Dire wrote: Reason for CRM:  Left message for patient to call back and schedule Medicare Annual Wellness Visit (AWV) in office.   If unable to come into the office for AWV,  please offer to do virtually or by telephone.  Last AWV:  07/07/2020  Please schedule at anytime with [facility] Health Advisor.  30 minute appointment for Virtual or phone 45 minute appointment for in office or Initial virtual/phone  Any questions, please contact me at [phone]

## 2022-07-08 ENCOUNTER — Other Ambulatory Visit: Payer: Self-pay | Admitting: Family Medicine

## 2022-07-08 DIAGNOSIS — I10 Essential (primary) hypertension: Secondary | ICD-10-CM

## 2022-07-08 DIAGNOSIS — F32A Depression, unspecified: Secondary | ICD-10-CM

## 2022-07-08 DIAGNOSIS — I700 Atherosclerosis of aorta: Secondary | ICD-10-CM

## 2022-07-19 ENCOUNTER — Other Ambulatory Visit: Payer: Self-pay | Admitting: Family Medicine

## 2022-07-19 DIAGNOSIS — S149XXA Injury of unspecified nerves of neck, initial encounter: Secondary | ICD-10-CM

## 2022-07-19 NOTE — Telephone Encounter (Signed)
Medication Refill - Medication: HYDROcodone-acetaminophen (NORCO) 7.5-325 MG tablet   Has the patient contacted their pharmacy? No.  Preferred Pharmacy (with phone number or street name):  [facility] #[id number] - [place], [place] - [address] Phone: [phone]  Fax: [phone]     Has the patient been seen for an appointment in the last year OR does the patient have an upcoming appointment? Yes.    Agent: Please be advised that RX refills may take up to 3 business days. We ask that you follow-up with your pharmacy.  

## 2022-07-20 MED ORDER — HYDROCODONE-ACETAMINOPHEN 7.5-325 MG PO TABS: 1.0000 | ORAL_TABLET | Freq: Four times a day (QID) | ORAL | 0 refills | Status: DC | PRN

## 2022-07-20 NOTE — Telephone Encounter (Signed)
Requested medication (s) are due for refill today: Yes  Requested medication (s) are on the active medication list: Yes  Last refill:  07/02/22  Future visit scheduled: Yes  Notes to clinic:  See request.    Requested Prescriptions  Pending Prescriptions Disp Refills   HYDROcodone-acetaminophen (NORCO) 7.5-325 MG tablet 30 tablet 0    Sig: Take 1 tablet by mouth every 6 (six) hours as needed for moderate pain.     Not Delegated - Analgesics:  Opioid Agonist Combinations Failed - 07/19/2022 10:23 AM      Failed - This refill cannot be delegated      Failed - Urine Drug Screen completed in last 360 days      Passed - Valid encounter within last 3 months    Recent Outpatient Visits           2 months ago Benign essential HTN   [facility] [name], MD   6 months ago [place], Donald E, MD   10 months ago [place], Kirstie Peri, MD   1 year ago Hypothyroidism, unspecified type   [facility] [name], MD   1 year ago Hypothyroidism, unspecified type   [facility] [name], MD       Future Appointments             In 1 month Fisher, Kirstie Peri, MD [facility], [place]

## 2022-07-29 ENCOUNTER — Ambulatory Visit (INDEPENDENT_AMBULATORY_CARE_PROVIDER_SITE_OTHER): Payer: BC Managed Care – PPO

## 2022-07-29 ENCOUNTER — Telehealth: Payer: Self-pay | Admitting: Family Medicine

## 2022-07-29 VITALS — Ht 65.0 in | Wt 182.0 lb

## 2022-07-29 DIAGNOSIS — Z0000 Encounter for general adult medical examination without abnormal findings: Secondary | ICD-10-CM | POA: Diagnosis not present

## 2022-07-29 MED ORDER — PROMETHAZINE HCL 25 MG PO TABS: ORAL_TABLET | ORAL | 3 refills | Status: DC

## 2022-07-29 NOTE — Addendum Note (Signed)
Addended by: [name] on: 07/29/2022 04:19 PM   Modules accepted: Orders

## 2022-07-29 NOTE — Progress Notes (Signed)
Virtual Visit via Telephone Note  I connected with  Jamie Williams on 07/29/22 at  3:15 PM EDT by telephone and verified that I am speaking with the correct person using two identifiers.  Location: Patient: home Provider: [facility] Persons participating in the virtual visit: [place]   I discussed the limitations, risks, security and privacy concerns of performing an evaluation and management service by telephone and the availability of in person appointments. The patient expressed understanding and agreed to proceed.  Interactive audio and video telecommunications were attempted between this nurse and patient, however failed, due to patient having technical difficulties OR patient did not have access to video capability.  We continued and completed visit with audio only.  Some vital signs may be absent or patient reported.   Jamie David, LPN  Subjective:   Jamie Williams is a 61 y.o. female who presents for Medicare Annual (Subsequent) preventive examination.  Review of Systems     Cardiac Risk Factors include: advanced age (>48mn, >>93women);dyslipidemia;hypertension;smoking/ tobacco exposure     Objective:    There were no vitals filed for this visit. There is no height or weight on file to calculate BMI.     07/29/2022    3:22 PM 08/22/2021   12:52 AM 07/11/2020    5:32 PM 07/11/2020    4:52 PM 07/07/2020    8:54 AM 07/03/2019   10:44 AM 06/27/2018    3:24 PM  Advanced Directives  Does Patient Have a Medical Advance Directive? No No  No No No No  Does patient want to make changes to medical advance directive?       Yes (MAU/Ambulatory/Procedural Areas - Information given)  Would patient like information on creating a medical advance directive? No - Patient declined  No - Patient declined   No - Patient declined     Current Medications (verified) Outpatient Encounter Medications as of 07/29/2022  Medication Sig   albuterol (VENTOLIN HFA) 108 (90 Base)  MCG/ACT inhaler Inhale 2 puffs into the lungs every 6 (six) hours as needed for wheezing or shortness of breath.   amLODipine (NORVASC) 10 MG tablet Take 1 tablet (10 mg total) by mouth every evening.   buPROPion (WELLBUTRIN SR) 150 MG 12 hr tablet Take 1 tablet (150 mg total) by mouth 2 (two) times daily.   cholecalciferol (VITAMIN D) 1000 units tablet Take 2,000 Units by mouth daily.    estradiol (ESTRACE) 2 MG tablet TAKE 1 TABLET(2 MG) BY MOUTH DAILY   gabapentin (NEURONTIN) 300 MG capsule TAKE 1 CAPSULE BY MOUTH THREE TIMES A DAY   hydrochlorothiazide (HYDRODIURIL) 25 MG tablet TAKE 1 TABLET(25 MG) BY MOUTH DAILY   HYDROcodone-acetaminophen (NORCO) 7.5-325 MG tablet Take 1 tablet by mouth every 6 (six) hours as needed for moderate pain.   levothyroxine (SYNTHROID) 50 MCG tablet TAKE 1 TABLET(50 MCG) BY MOUTH DAILY   meloxicam (MOBIC) 15 MG tablet TAKE 1 TABLET(15 MG) BY MOUTH DAILY WITH FOOD   PARoxetine (PAXIL) 30 MG tablet TAKE 2 TABLETS(60 MG) BY MOUTH DAILY   pravastatin (PRAVACHOL) 40 MG tablet TAKE 1 TABLET(40 MG) BY MOUTH DAILY   promethazine (PHENERGAN) 25 MG tablet TAKE 1 TABLET(25 MG) BY MOUTH EVERY 6 HOURS AS NEEDED FOR NAUSEA OR VOMITING   [DISCONTINUED] ranitidine (ZANTAC) 150 MG tablet RANITIDINE HCL, '150MG'$  (Oral Tablet)  1 Two Times A Day for heartburn, indigestion, nausea for 0 days  Quantity: 60.00;  Refills: 5   Ordered :22-Jul-2010  SReginia Forts  MD;  Buddy Duty 28-Jan-2010 Active Comments: DX: 787.02 (Patient not taking: Reported on 07/29/2022)   No facility-administered encounter medications on file as of 07/29/2022.    Allergies (verified) Aspirin, Penicillins, and Sulfa antibiotics   History: Past Medical History:  Diagnosis Date   Anxiety    Family history of adverse reaction to anesthesia    "sister had a difficult time waking up from mastectomy and passed away"    GERD (gastroesophageal reflux disease)    History of chicken pox    History of kidney stones     PONV (postoperative nausea and vomiting)    nausea   Sciatic nerve pain    Past Surgical History:  Procedure Laterality Date   ANTERIOR CERVICAL DECOMP/DISCECTOMY FUSION N/A 12/24/2015   Procedure: Cervical five-six, Cervical six-seven  Anterior cervical decompression/diskectomy/fusion/interbody prosthesis/plate;  Surgeon: Newman Pies, MD;  Location: [facility];  Service: Neurosurgery;  Laterality: N/A;   COLONOSCOPY WITH PROPOFOL N/A 07/17/2018   Procedure: COLONOSCOPY WITH PROPOFOL;  Surgeon: Jonathon Bellows, MD;  Location: [facility] ENDOSCOPY;  Service: Gastroenterology;  Laterality: N/A;   [place] and 1996   two Etopic preg.   Head Injuries  2006   surgery 2006 Cram/NS in [place]. Headaches with increased intracranial pressure. Repeat MRI in 2008 Negative   SUPRACERVICAL ABDOMINAL HYSTERECTOMY  2011   Abdominal; Ovaries intact; CERVIX INTACT. Fibroids/dys menorrhea. [place]   Ulnar Neuropathy Right 2004   Ulnar Neuropathy surgical revision. 2004-2008. [place].   Family History  Problem Relation Age of Onset   Hypertension Mother    Diabetes Mother        Type 2   Breast cancer Mother 29   Cancer Sister 79       Breast   Diabetes Sister        type 2   Breast cancer Sister 48   Breast cancer Sister    Diabetes Sister    Diabetes Sister    Diabetes Sister        type 2   Liver cancer Brother    Stomach cancer Brother    Social History   Socioeconomic History   Marital status: Married    Spouse name: Not on file   Number of children: 1   Years of education: Not on file   Highest education level: High school graduate  Occupational History   Occupation: disabled  Tobacco Use   Smoking status: Every Day    Packs/day: 0.50    Types: Cigarettes   Smokeless tobacco: Never   Tobacco comments:    Started about 61 years old usually 1/2 ppd  Vaping Use   Vaping Use: Never used  Substance and Sexual Activity   Alcohol use: Not Currently     Alcohol/week: 0.0 standard drinks of alcohol   Drug use: Not Currently   Sexual activity: Yes    Birth control/protection: None  Other Topics Concern   Not on file  Social History Narrative   Not on file   Social Determinants of Health   Financial Resource Strain: Low Risk  (07/29/2022)   Overall Financial Resource Strain (CARDIA)    Difficulty of Paying Living Expenses: Not hard at all  Food Insecurity: No Food Insecurity (07/29/2022)   Hunger Vital Sign    Worried About Running Out of Food in the Last Year: Never true    [place] in the Last Year: Never true  Transportation Needs: No Transportation Needs (07/29/2022)   [place] - Transportation  Lack of Transportation (Medical): No    Lack of Transportation (Non-Medical): No  Physical Activity: Inactive (07/29/2022)   Exercise Vital Sign    Days of Exercise per Week: 0 days    Minutes of Exercise per Session: 0 min  Stress: No Stress Concern Present (07/29/2022)   [place]    Feeling of Stress : Only a little  Social Connections: Moderately Integrated (07/29/2022)   Social Connection and Isolation Panel [NHANES]    Frequency of Communication with Friends and Family: Three times a week    Frequency of Social Gatherings with Friends and Family: Once a week    Attends Religious Services: More than 4 times per year    Active Member of [employer] or Organizations: No    Attends Music therapist: Never    Marital Status: Married    Tobacco Counseling Ready to quit: Not Answered Counseling given: Not Answered Tobacco comments: Started about 61 years old usually 1/2 ppd   Clinical Intake:  Pre-visit preparation completed: Yes  Pain : No/denies pain     Nutritional Risks: None Diabetes: No  How often do you need to have someone help you when you read instructions, pamphlets, or other written materials from your doctor or pharmacy?: 1 -  Never  Diabetic?no  Interpreter Needed?: No  Information entered by :: Kirke Shaggy, LPN   Activities of Daily Living    07/29/2022    3:26 PM 07/26/2022    9:50 AM  In your present state of health, do you have any difficulty performing the following activities:  Hearing? 0 0  Vision? 1 1  Difficulty concentrating or making decisions? 0 1  Walking or climbing stairs? 1 1  Dressing or bathing? 0 0  Doing errands, shopping? 0 1  Preparing Food and eating ? N N  Using the Toilet? N N  In the past six months, have you accidently leaked urine? Y Y  Do you have problems with loss of bowel control? N N  Managing your Medications? N N  Managing your Finances? N N  Housekeeping or managing your Housekeeping? Aggie Moats    Patient Care Team: [name], MD as PCP - General (Family Medicine) Will Bonnet, MD as Attending Physician (Obstetrics and Gynecology)  Indicate any recent Medical Services you may have received from other than [facility] providers in the past year (date may be approximate).     Assessment:   This is a routine wellness examination for Laquida.  Hearing/Vision screen Hearing Screening - Comments:: No aids Vision Screening - Comments:: Wears glasses-   Dietary issues and exercise activities discussed: Current Exercise Habits: The patient does not participate in regular exercise at present   Goals Addressed             This Visit's Progress    DIET - EAT MORE FRUITS AND VEGETABLES         Depression Screen    07/29/2022    3:20 PM 04/30/2022   11:33 AM 12/28/2021    8:51 AM 07/03/2021    9:47 AM 01/07/2021    8:43 AM 07/21/2020    8:50 AM 07/07/2020    8:48 AM  PHQ 2/9 Scores  PHQ - 2 Score 0 0 0 0 '1 1 1  '$ PHQ- 9 Score 0 '2  2 3 3     '$ Fall Risk    07/29/2022    3:23 PM 07/26/2022    9:50  AM 12/28/2021    8:51 AM 07/03/2021    9:47 AM 07/21/2020    8:50 AM  Fall Risk   Falls in the past year? 0 0 0 0 0  Number falls in past yr: 0 0 0 0 0  Injury  with Fall? 0 0 0 0 0  Risk for fall due to : No Fall Risks   No Fall Risks   Follow up Falls prevention discussed;Falls evaluation completed   Falls evaluation completed Falls evaluation completed    FALL RISK PREVENTION PERTAINING TO THE HOME:  Any stairs in or around the home? No  If so, are there any without handrails? No  Home free of loose throw rugs in walkways, pet beds, electrical cords, etc? Yes  Adequate lighting in your home to reduce risk of falls? Yes   ASSISTIVE DEVICES UTILIZED TO PREVENT FALLS:  Life alert? No  Use of a cane, walker or w/c? No  Grab bars in the bathroom? No  Shower chair or bench in shower? No  Elevated toilet seat or a handicapped toilet? No   Cognitive Function:        07/29/2022    3:26 PM 07/07/2020    9:04 AM 07/03/2019   10:51 AM 06/27/2018    3:31 PM 09/30/2016    3:16 PM  6CIT Screen  What Year? 0 points 0 points 0 points 0 points 0 points  What month? 0 points 0 points 0 points 0 points 0 points  What time? 0 points 0 points 0 points 0 points 0 points  Count back from 20 0 points 0 points 0 points 0 points 0 points  Months in reverse 0 points 0 points 0 points 2 points 2 points  Repeat phrase 0 points 0 points 2 points 0 points 4 points  Total Score 0 points 0 points 2 points 2 points 6 points    Immunizations Immunization History  Administered Date(s) Administered   Influenza,inj,Quad PF,6+ Mos 08/29/2015, 08/26/2016, 11/14/2017, 07/11/2018, 07/13/2019, 07/21/2020   PFIZER(Purple Top)SARS-COV-2 Vaccination 02/14/2020, 03/11/2020   Pneumococcal Polysaccharide-23 02/24/2011   Tdap 01/28/2010    TDAP status: Due, Education has been provided regarding the importance of this vaccine. Advised may receive this vaccine at local pharmacy or Health Dept. Aware to provide a copy of the vaccination record if obtained from local pharmacy or Health Dept. Verbalized acceptance and understanding.  Flu Vaccine status: Due, Education has been  provided regarding the importance of this vaccine. Advised may receive this vaccine at local pharmacy or Health Dept. Aware to provide a copy of the vaccination record if obtained from local pharmacy or Health Dept. Verbalized acceptance and understanding.  Pneumococcal vaccine status: Due, Education has been provided regarding the importance of this vaccine. Advised may receive this vaccine at local pharmacy or Health Dept. Aware to provide a copy of the vaccination record if obtained from local pharmacy or Health Dept. Verbalized acceptance and understanding.  Covid-19 vaccine status: Completed vaccines  Qualifies for Shingles Vaccine? No   Zostavax completed No   Shingrix Completed?: No.    Education has been provided regarding the importance of this vaccine. Patient has been advised to call insurance company to determine out of pocket expense if they have not yet received this vaccine. Advised may also receive vaccine at local pharmacy or Health Dept. Verbalized acceptance and understanding.  Screening Tests Health Maintenance  Topic Date Due   Zoster Vaccines- Shingrix (1 of 2) Never done   TETANUS/TDAP  01/29/2020  COVID-19 Vaccine (3 - Pfizer series) 05/06/2020   INFLUENZA VACCINE  05/25/2022   MAMMOGRAM  02/25/2023   PAP SMEAR-Modifier  03/31/2023   COLONOSCOPY (Pts 45-31yr Insurance coverage will need to be confirmed)  07/18/2023   Hepatitis C Screening  Completed   HIV Screening  Completed   HPV VACCINES  Aged Out    Health Maintenance  Health Maintenance Due  Topic Date Due   Zoster Vaccines- Shingrix (1 of 2) Never done   TETANUS/TDAP  01/29/2020   COVID-19 Vaccine (3 - Pfizer series) 05/06/2020   INFLUENZA VACCINE  05/25/2022    Colorectal cancer screening: Type of screening: Colonoscopy. Completed 07/17/18. Repeat every 5 years  Mammogram status: Completed 02/24/22. Repeat every year   Lung Cancer Screening: (Low Dose CT Chest recommended if Age 59-80years, 30  pack-year currently smoking OR have quit w/in 15years.) does not qualify.    Additional Screening:  Hepatitis C Screening: does qualify; Completed 09/30/16  Vision Screening: Recommended annual ophthalmology exams for early detection of glaucoma and other disorders of the eye. Is the patient up to date with their annual eye exam?  No  Who is the provider or what is the name of the office in which the patient attends annual eye exams? No one If pt is not established with a provider, would they like to be referred to a provider to establish care? No .   Dental Screening: Recommended annual dental exams for proper oral hygiene  Community Resource Referral / Chronic Care Management: CRR required this visit?  No   CCM required this visit?  No      Plan:     I have personally reviewed and noted the following in the patient's chart:   Medical and social history Use of alcohol, tobacco or illicit drugs  Current medications and supplements including opioid prescriptions. Patient is not currently taking opioid prescriptions. Functional ability and status Nutritional status Physical activity Advanced directives List of other physicians Hospitalizations, surgeries, and ER visits in previous 12 months Vitals Screenings to include cognitive, depression, and falls Referrals and appointments  In addition, I have reviewed and discussed with patient certain preventive protocols, quality metrics, and best practice recommendations. A written personalized care plan for preventive services as well as general preventive health recommendations were provided to patient.     LDionisio David LPN   [id number]  Nurse Notes: none

## 2022-07-29 NOTE — Patient Instructions (Signed)
[name] , Thank you for taking time to come for your Medicare Wellness Visit. I appreciate your ongoing commitment to your health goals. Please review the following plan we discussed and let me know if I can assist you in the future.   Screening recommendations/referrals: Colonoscopy: 07/17/18 Mammogram: 02/24/22 Recommended yearly ophthalmology/optometry visit for glaucoma screening and checkup Recommended yearly dental visit for hygiene and checkup  Vaccinations: Influenza vaccine: n/d Pneumococcal vaccine: 02/24/11 Tdap vaccine: 01/28/10, due if have injury Shingles vaccine: n/d  Covid-19: 02/14/20, 03/11/20  Advanced directives: no  Conditions/risks identified: none  Next appointment: Follow up in one year for your annual wellness visit. 08/01/23 @ 3:15pm by phone  Preventive Care 40-64 Years, Female Preventive care refers to lifestyle choices and visits with your health care provider that can promote health and wellness. What does preventive care include? A yearly physical exam. This is also called an annual well check. Dental exams once or twice a year. Routine eye exams. Ask your health care provider how often you should have your eyes checked. Personal lifestyle choices, including: Daily care of your teeth and gums. Regular physical activity. Eating a healthy diet. Avoiding tobacco and drug use. Limiting alcohol use. Practicing safe sex. Taking low-dose aspirin daily starting at age 64. Taking vitamin and mineral supplements as recommended by your health care provider. What happens during an annual well check? The services and screenings done by your health care provider during your annual well check will depend on your age, overall health, lifestyle risk factors, and family history of disease. Counseling  Your health care provider may ask you questions about your: Alcohol use. Tobacco use. Drug use. Emotional well-being. Home and relationship well-being. Sexual  activity. Eating habits. Work and work Statistician. Method of birth control. Menstrual cycle. Pregnancy history. Screening  You may have the following tests or measurements: Height, weight, and BMI. Blood pressure. Lipid and cholesterol levels. These may be checked every 5 years, or more frequently if you are over 97 years old. Skin check. Lung cancer screening. You may have this screening every year starting at age 2 if you have a 30-pack-year history of smoking and currently smoke or have quit within the past 15 years. Fecal occult blood test (FOBT) of the stool. You may have this test every year starting at age 61. Flexible sigmoidoscopy or colonoscopy. You may have a sigmoidoscopy every 5 years or a colonoscopy every 10 years starting at age 27. Hepatitis C blood test. Hepatitis B blood test. Sexually transmitted disease (STD) testing. Diabetes screening. This is done by checking your blood sugar (glucose) after you have not eaten for a while (fasting). You may have this done every 1-3 years. Mammogram. This may be done every 1-2 years. Talk to your health care provider about when you should start having regular mammograms. This may depend on whether you have a family history of breast cancer. BRCA-related cancer screening. This may be done if you have a family history of breast, ovarian, tubal, or peritoneal cancers. Pelvic exam and Pap test. This may be done every 3 years starting at age 36. Starting at age 23, this may be done every 5 years if you have a Pap test in combination with an HPV test. Bone density scan. This is done to screen for osteoporosis. You may have this scan if you are at high risk for osteoporosis. Discuss your test results, treatment options, and if necessary, the need for more tests with your health care provider. Vaccines  Your health care provider may recommend certain vaccines, such as: Influenza vaccine. This is recommended every year. Tetanus, diphtheria,  and acellular pertussis (Tdap, Td) vaccine. You may need a Td booster every 10 years. Zoster vaccine. You may need this after age 60. Pneumococcal 13-valent conjugate (PCV13) vaccine. You may need this if you have certain conditions and were not previously vaccinated. Pneumococcal polysaccharide (PPSV23) vaccine. You may need one or two doses if you smoke cigarettes or if you have certain conditions. Talk to your health care provider about which screenings and vaccines you need and how often you need them. This information is not intended to replace advice given to you by your health care provider. Make sure you discuss any questions you have with your health care provider. Document Released: 11/07/2015 Document Revised: 06/30/2016 Document Reviewed: 08/12/2015 Elsevier Interactive Patient Education  2017 Elsevier Inc.    Fall Prevention in the Home Falls can cause injuries. They can happen to people of all ages. There are many things you can do to make your home safe and to help prevent falls. What can I do on the outside of my home? Regularly fix the edges of walkways and driveways and fix any cracks. Remove anything that might make you trip as you walk through a door, such as a raised step or threshold. Trim any bushes or trees on the path to your home. Use bright outdoor lighting. Clear any walking paths of anything that might make someone trip, such as rocks or tools. Regularly check to see if handrails are loose or broken. Make sure that both sides of any steps have handrails. Any raised decks and porches should have guardrails on the edges. Have any leaves, snow, or ice cleared regularly. Use sand or salt on walking paths during winter. Clean up any spills in your garage right away. This includes oil or grease spills. What can I do in the bathroom? Use night lights. Install grab bars by the toilet and in the tub and shower. Do not use towel bars as grab bars. Use non-skid mats or  decals in the tub or shower. If you need to sit down in the shower, use a plastic, non-slip stool. Keep the floor dry. Clean up any water that spills on the floor as soon as it happens. Remove soap buildup in the tub or shower regularly. Attach bath mats securely with double-sided non-slip rug tape. Do not have throw rugs and other things on the floor that can make you trip. What can I do in the bedroom? Use night lights. Make sure that you have a light by your bed that is easy to reach. Do not use any sheets or blankets that are too big for your bed. They should not hang down onto the floor. Have a firm chair that has side arms. You can use this for support while you get dressed. Do not have throw rugs and other things on the floor that can make you trip. What can I do in the kitchen? Clean up any spills right away. Avoid walking on wet floors. Keep items that you use a lot in easy-to-reach places. If you need to reach something above you, use a strong step stool that has a grab bar. Keep electrical cords out of the way. Do not use floor polish or wax that makes floors slippery. If you must use wax, use non-skid floor wax. Do not have throw rugs and other things on the floor that can make you trip. What   can I do with my stairs? Do not leave any items on the stairs. Make sure that there are handrails on both sides of the stairs and use them. Fix handrails that are broken or loose. Make sure that handrails are as long as the stairways. Check any carpeting to make sure that it is firmly attached to the stairs. Fix any carpet that is loose or worn. Avoid having throw rugs at the top or bottom of the stairs. If you do have throw rugs, attach them to the floor with carpet tape. Make sure that you have a light switch at the top of the stairs and the bottom of the stairs. If you do not have them, ask someone to add them for you. What else can I do to help prevent falls? Wear shoes that: Do not  have high heels. Have rubber bottoms. Are comfortable and fit you well. Are closed at the toe. Do not wear sandals. If you use a stepladder: Make sure that it is fully opened. Do not climb a closed stepladder. Make sure that both sides of the stepladder are locked into place. Ask someone to hold it for you, if possible. Clearly mark and make sure that you can see: Any grab bars or handrails. First and last steps. Where the edge of each step is. Use tools that help you move around (mobility aids) if they are needed. These include: Canes. Walkers. Scooters. Crutches. Turn on the lights when you go into a dark area. Replace any light bulbs as soon as they burn out. Set up your furniture so you have a clear path. Avoid moving your furniture around. If any of your floors are uneven, fix them. If there are any pets around you, be aware of where they are. Review your medicines with your doctor. Some medicines can make you feel dizzy. This can increase your chance of falling. Ask your doctor what other things that you can do to help prevent falls. This information is not intended to replace advice given to you by your health care provider. Make sure you discuss any questions you have with your health care provider. Document Released: 08/07/2009 Document Revised: 03/18/2016 Document Reviewed: 11/15/2014 Elsevier Interactive Patient Education  2017 Elsevier Inc.  

## 2022-07-29 NOTE — Telephone Encounter (Signed)
Pt is calling to check on the status of her medication refill. Pt states that the pharmacy stated that Dr. Caryn Section denied the medication. Please advise

## 2022-08-03 ENCOUNTER — Other Ambulatory Visit: Payer: Self-pay | Admitting: Family Medicine

## 2022-08-03 DIAGNOSIS — S149XXA Injury of unspecified nerves of neck, initial encounter: Secondary | ICD-10-CM

## 2022-08-03 NOTE — Telephone Encounter (Signed)
Medication Refill - Medication: HYDROcodone-acetaminophen (NORCO) 7.5-325 MG tablet   Has the patient contacted their pharmacy? No. (Agent: If no, request that the patient contact the pharmacy for the refill. If patient does not wish to contact the pharmacy document the reason why and proceed with request.) (Agent: If yes, when and what did the pharmacy advise?)  Preferred Pharmacy (with phone number or street name): [facility][id number] Lorina Rabon, [place] - [address]  [place], [place] [address]  Phone:  [phone]  Fax:  [phone]  DEA #:  [id number] Has the patient been seen for an appointment in the last year OR does the patient have an upcoming appointment? Yes.    Agent: Please be advised that RX refills may take up to 3 business days. We ask that you follow-up with your pharmacy.

## 2022-08-03 NOTE — Telephone Encounter (Signed)
Requested medication (s) are due for refill today:yes  Requested medication (s) are on the active medication lis yes  Last refill:  07/20/22  Future visit scheduled yes  Notes to clinic:  Unable to refill per protocol, cannot delegate.      Requested Prescriptions  Pending Prescriptions Disp Refills   HYDROcodone-acetaminophen (NORCO) 7.5-325 MG tablet 30 tablet 0    Sig: Take 1 tablet by mouth every 6 (six) hours as needed for moderate pain.     Not Delegated - Analgesics:  Opioid Agonist Combinations Failed - 08/03/2022 12:04 PM      Failed - This refill cannot be delegated      Failed - Urine Drug Screen completed in last 360 days      Passed - Valid encounter within last 3 months    Recent Outpatient Visits           3 months ago Benign essential HTN   [facility] [name], MD   7 months ago [place], Donald E, MD   11 months ago [place], Kirstie Peri, MD   1 year ago Hypothyroidism, unspecified type   [facility] [name], MD   1 year ago Hypothyroidism, unspecified type   [facility] [name], MD       Future Appointments             In 3 weeks Fisher, Kirstie Peri, MD [facility], [place]

## 2022-08-04 MED ORDER — HYDROCODONE-ACETAMINOPHEN 7.5-325 MG PO TABS: 1.0000 | ORAL_TABLET | Freq: Four times a day (QID) | ORAL | 0 refills | Status: DC | PRN

## 2022-08-16 ENCOUNTER — Other Ambulatory Visit: Payer: Self-pay | Admitting: Family Medicine

## 2022-08-16 DIAGNOSIS — S149XXA Injury of unspecified nerves of neck, initial encounter: Secondary | ICD-10-CM

## 2022-08-16 NOTE — Telephone Encounter (Signed)
Medication Refill - Medication: HYDROcodone-acetaminophen (NORCO) 7.5-325 MG tablet  Has the patient contacted their pharmacy? No since this is hydrocodone (Agent: If no, request that the patient contact the pharmacy for the refill. If patient does not wish to contact the pharmacy document the reason why and proceed with request.) (Agent: If yes, when and what did the pharmacy advise?)patient called in directly  Preferred Pharmacy (with phone number or street name):  [facility] #[id number] Lorina Rabon, [place] Phone:  [phone]  Fax:  [phone]     Has the patient been seen for an appointment in the last year OR does the patient have an upcoming appointment? yes  Agent: Please be advised that RX refills may take up to 3 business days. We ask that you follow-up with your pharmacy.

## 2022-08-17 MED ORDER — HYDROCODONE-ACETAMINOPHEN 7.5-325 MG PO TABS: 1.0000 | ORAL_TABLET | Freq: Four times a day (QID) | ORAL | 0 refills | Status: DC | PRN

## 2022-08-17 NOTE — Telephone Encounter (Signed)
Requested medication (s) are due for refill today - provider review   Requested medication (s) are on the active medication list -yes  Future visit scheduled -yes  Last refill: 08/04/22 #30  Notes to clinic: non delegated Rx  Requested Prescriptions  Pending Prescriptions Disp Refills   HYDROcodone-acetaminophen (NORCO) 7.5-325 MG tablet 30 tablet 0    Sig: Take 1 tablet by mouth every 6 (six) hours as needed for moderate pain.     Not Delegated - Analgesics:  Opioid Agonist Combinations Failed - 08/16/2022  3:06 PM      Failed - This refill cannot be delegated      Failed - Urine Drug Screen completed in last 360 days      Passed - Valid encounter within last 3 months    Recent Outpatient Visits           3 months ago Benign essential HTN   [facility] [name], MD   7 months ago [place], Donald E, MD   11 months ago [place], Kirstie Peri, MD   1 year ago Hypothyroidism, unspecified type   [facility] [name], MD   1 year ago Hypothyroidism, unspecified type   [facility] [name], MD       Future Appointments             In 1 week Fisher, Kirstie Peri, MD [facility], [place]               Requested Prescriptions  Pending Prescriptions Disp Refills   HYDROcodone-acetaminophen (NORCO) 7.5-325 MG tablet 30 tablet 0    Sig: Take 1 tablet by mouth every 6 (six) hours as needed for moderate pain.     Not Delegated - Analgesics:  Opioid Agonist Combinations Failed - 08/16/2022  3:06 PM      Failed - This refill cannot be delegated      Failed - Urine Drug Screen completed in last 360 days      Passed - Valid encounter within last 3 months    Recent Outpatient Visits           3 months ago Benign essential HTN   [facility] [name], MD   7 months ago [place], Donald E, MD   11 months ago [employer], Kirstie Peri, MD   1 year ago Hypothyroidism, unspecified type   [facility] [name], MD   1 year ago Hypothyroidism, unspecified type   [facility] [name], MD       Future Appointments             In 1 week Fisher, Kirstie Peri, MD [facility], [place]

## 2022-08-27 NOTE — Progress Notes (Unsigned)
I,[name],acting as a scribe for Lelon Huh, MD.,have documented all relevant documentation on the behalf of Lelon Huh, MD,as directed by  Lelon Huh, MD while in the presence of Lelon Huh, MD.    Established patient visit   Patient: Jamie Williams   DOB: [birth year]   61 y.o. Female  MRN: [id number] Visit Date: 08/30/2022  Today's healthcare provider: Lelon Huh, MD   Chief Complaint  Patient presents with   Hypertension   Subjective    HPI  Hypertension, follow-up  BP Readings from Last 3 Encounters:  08/30/22 (!) 149/84  04/30/22 (!) 176/96  04/13/22 128/78   Wt Readings from Last 3 Encounters:  08/30/22 180 lb (81.6 kg)  07/29/22 182 lb (82.6 kg)  04/30/22 182 lb (82.6 kg)     She was last seen for hypertension 7 months ago.  BP at that visit was 176/96. Management since that visit includes changing amlodipine to the evening instead of morning.    She reports fair compliance with treatment.Patient has been taking amlodipine in the mornings. She is not having side effects.  She is following a Regular diet. She is not exercising. She does smoke.  Use of agents associated with hypertension: estrogens.   Outside blood pressures are checked occasionally. Symptoms: No chest pain No chest pressure  No palpitations No syncope  No dyspnea No orthopnea  No paroxysmal nocturnal dyspnea No lower extremity edema   Pertinent labs Lab Results  Component Value Date   CHOL 245 (H) 07/03/2021   HDL 66 07/03/2021   LDLCALC 163 (H) 07/03/2021   TRIG 92 07/03/2021   CHOLHDL 3.7 07/03/2021   Lab Results  Component Value Date   NA 142 07/03/2021   K 4.7 07/03/2021   CREATININE 0.95 07/03/2021   EGFR 69 07/03/2021   GLUCOSE 118 (H) 07/03/2021   TSH 3.590 07/03/2021     The 10-year ASCVD risk score (Arnett DK, et al., 2019) is: 20.2%  ---------------------------------------------------------------------------------------------------    Cough: Patient complains of productive cough, chest congestion and shortness of breath for the past 6 days. Has tried taking Mucinex DM.  Home COVID test was negative 7 days ago.    Medications: Outpatient Medications Prior to Visit  Medication Sig   albuterol (VENTOLIN HFA) 108 (90 Base) MCG/ACT inhaler Inhale 2 puffs into the lungs every 6 (six) hours as needed for wheezing or shortness of breath.   amLODipine (NORVASC) 10 MG tablet Take 1 tablet (10 mg total) by mouth every evening.   buPROPion (WELLBUTRIN SR) 150 MG 12 hr tablet Take 1 tablet (150 mg total) by mouth 2 (two) times daily.   cholecalciferol (VITAMIN D) 1000 units tablet Take 2,000 Units by mouth daily.    estradiol (ESTRACE) 2 MG tablet TAKE 1 TABLET(2 MG) BY MOUTH DAILY   gabapentin (NEURONTIN) 300 MG capsule TAKE 1 CAPSULE BY MOUTH THREE TIMES A DAY   hydrochlorothiazide (HYDRODIURIL) 25 MG tablet TAKE 1 TABLET(25 MG) BY MOUTH DAILY   HYDROcodone-acetaminophen (NORCO) 7.5-325 MG tablet Take 1 tablet by mouth every 6 (six) hours as needed for moderate pain.   levothyroxine (SYNTHROID) 50 MCG tablet TAKE 1 TABLET(50 MCG) BY MOUTH DAILY   meloxicam (MOBIC) 15 MG tablet TAKE 1 TABLET(15 MG) BY MOUTH DAILY WITH FOOD   PARoxetine (PAXIL) 30 MG tablet TAKE 2 TABLETS(60 MG) BY MOUTH DAILY   pravastatin (PRAVACHOL) 40 MG tablet TAKE 1 TABLET(40 MG) BY MOUTH DAILY   promethazine (PHENERGAN) 25 MG tablet  TAKE 1 TABLET(25 MG) BY MOUTH EVERY 6 HOURS AS NEEDED FOR NAUSEA OR VOMITING   No facility-administered medications prior to visit.    Review of Systems  Constitutional:  Negative for appetite change, chills, fatigue and fever.  HENT:  Positive for congestion and sneezing.   Respiratory:  Positive for cough (productive with yellow sputum x 6 days) and shortness of breath. Negative for chest tightness.   Cardiovascular:  Negative for chest pain and palpitations.  Gastrointestinal:  Negative for abdominal pain, nausea and  vomiting.  Neurological:  Negative for dizziness and weakness.       Objective    BP (!) 149/84 (BP Location: Left Arm, Patient Position: Sitting, Cuff Size: Large)   Pulse 74   Temp 98.4 F (36.9 C) (Oral)   Resp 16   Wt 180 lb (81.6 kg)   SpO2 100% Comment: room air  BMI 29.95 kg/m   Today's Vitals   08/30/22 0850 08/30/22 0854  BP: (!) 145/84 (!) 149/84  Pulse: 74 74  Resp: 16   Temp: 98.4 F (36.9 C)   TempSrc: Oral   SpO2: 100%   Weight: 180 lb (81.6 kg)    Body mass index is 29.95 kg/m.    Physical Exam    General: Appearance:    Well developed, well nourished female in no acute distress  Eyes:    PERRL, conjunctiva/corneas clear, EOM's intact       Lungs:     Diffuse expiratory wheezes, no rales or rhochi, respirations unlabored  Heart:    Normal heart rate. Normal rhythm. No murmurs, rubs, or gallops.    MS:   All extremities are intact.    Neurologic:   Awake, alert, oriented x 3. No apparent focal neurological defect.         Assessment & Plan     1. Primary hypertension Improved with increased dose of amlodipine. Per pt report was normal before she started URI symptoms last week.  - Magnesium  2. Cough, unspecified type  - azithromycin (ZITHROMAX) 250 MG tablet; Take 2 tablets on day 1, then 1 tablet daily on days 2 through 5  Dispense: 6 tablet; Refill: 0 - promethazine-codeine (PHENERGAN WITH CODEINE) 6.25-10 MG/5ML syrup; Take 5-10 mLs by mouth at bedtime.  Dispense: 120 mL; Refill: 0 (changed to Hycodan after pharmacy called reports phenergan with codeine is not available)  3. Hypothyroidism, unspecified type  - TSH  4. Tobacco abuse She is taking bupropion which has helped, but she still can't quit alltogether. She would like to try nicotine (NICODERM CQ) 7 mg/24hr patch; Place 1 patch (7 mg total) onto the skin daily.  Dispense: 28 patch; Refill: 0  5. Vitamin D deficiency Due to check  VITAMIN D 25 Hydroxy (Vit-D Deficiency,  Fractures)  6. Pre-diabetes  - Hemoglobin A1c  7. Hypercholesterolemia without hypertriglyceridemia She is tolerating pravastatin well with no adverse effects.   - Comprehensive metabolic panel - CBC - Lipid panel      The entirety of the information documented in the History of Present Illness, Review of Systems and Physical Exam were personally obtained by me. Portions of this information were initially documented by the CMA and reviewed by me for thoroughness and accuracy.     Lelon Huh, MD  [facility] [phone] (phone) [phone] (fax)  [place]

## 2022-08-30 ENCOUNTER — Encounter: Payer: Self-pay | Admitting: Family Medicine

## 2022-08-30 ENCOUNTER — Telehealth: Payer: Self-pay | Admitting: Family Medicine

## 2022-08-30 ENCOUNTER — Ambulatory Visit (INDEPENDENT_AMBULATORY_CARE_PROVIDER_SITE_OTHER): Payer: BC Managed Care – PPO | Admitting: Family Medicine

## 2022-08-30 VITALS — BP 149/84 | HR 74 | Temp 98.4°F | Resp 16 | Wt 180.0 lb

## 2022-08-30 DIAGNOSIS — R7303 Prediabetes: Secondary | ICD-10-CM | POA: Diagnosis not present

## 2022-08-30 DIAGNOSIS — E039 Hypothyroidism, unspecified: Secondary | ICD-10-CM

## 2022-08-30 DIAGNOSIS — R059 Cough, unspecified: Secondary | ICD-10-CM

## 2022-08-30 DIAGNOSIS — I10 Essential (primary) hypertension: Secondary | ICD-10-CM

## 2022-08-30 DIAGNOSIS — E559 Vitamin D deficiency, unspecified: Secondary | ICD-10-CM | POA: Diagnosis not present

## 2022-08-30 DIAGNOSIS — E7800 Pure hypercholesterolemia, unspecified: Secondary | ICD-10-CM | POA: Diagnosis not present

## 2022-08-30 DIAGNOSIS — Z720 Tobacco use: Secondary | ICD-10-CM | POA: Diagnosis not present

## 2022-08-30 MED ORDER — NICOTINE 7 MG/24HR TD PT24: 7.0000 mg | MEDICATED_PATCH | Freq: Every day | TRANSDERMAL | 0 refills | Status: DC

## 2022-08-30 MED ORDER — AZITHROMYCIN 250 MG PO TABS: ORAL_TABLET | ORAL | 0 refills | Status: AC

## 2022-08-30 MED ORDER — HYDROCODONE BIT-HOMATROP MBR 5-1.5 MG/5ML PO SOLN: 5.0000 mL | Freq: Three times a day (TID) | ORAL | 0 refills | Status: DC | PRN

## 2022-08-30 MED ORDER — PROMETHAZINE-CODEINE 6.25-10 MG/5ML PO SYRP: 5.0000 mL | ORAL_SOLUTION | Freq: Every day | ORAL | 0 refills | Status: DC

## 2022-08-30 NOTE — Telephone Encounter (Signed)
Meghan from [employer] stated medication promethazine-codeine (PHENERGAN WITH CODEINE) 6.25-10 MG/5ML syrup is no longer carried. Please send in something else.  Please advise.

## 2022-08-30 NOTE — Addendum Note (Signed)
Addended by: [name] on: 08/30/2022 10:32 AM   Modules accepted: Orders

## 2022-08-30 NOTE — Telephone Encounter (Signed)
Changed to [facility]

## 2022-08-31 LAB — CBC
Hematocrit: 45.6 % (ref 34.0–46.6)
Hemoglobin: 14.9 g/dL (ref 11.1–15.9)
MCH: 28.5 pg (ref 26.6–33.0)
MCHC: 32.7 g/dL (ref 31.5–35.7)
MCV: 87 fL (ref 79–97)
Platelets: 325 10*3/uL (ref 150–450)
RBC: 5.23 x10E6/uL (ref 3.77–5.28)
RDW: 12.2 % (ref 11.7–15.4)
WBC: 9.4 10*3/uL (ref 3.4–10.8)

## 2022-08-31 LAB — COMPREHENSIVE METABOLIC PANEL
ALT: 30 IU/L (ref 0–32)
AST: 24 IU/L (ref 0–40)
Albumin/Globulin Ratio: 2 (ref 1.2–2.2)
Albumin: 4.6 g/dL (ref 3.8–4.9)
Alkaline Phosphatase: 149 IU/L — ABNORMAL HIGH (ref 44–121)
BUN/Creatinine Ratio: 10 (ref 9–23)
BUN: 10 mg/dL (ref 6–24)
Bilirubin Total: 0.3 mg/dL (ref 0.0–1.2)
CO2: 23 mmol/L (ref 20–29)
Calcium: 10.1 mg/dL (ref 8.7–10.2)
Chloride: 106 mmol/L (ref 96–106)
Creatinine, Ser: 1 mg/dL (ref 0.57–1.00)
Globulin, Total: 2.3 g/dL (ref 1.5–4.5)
Glucose: 112 mg/dL — ABNORMAL HIGH (ref 70–99)
Potassium: 4.5 mmol/L (ref 3.5–5.2)
Sodium: 145 mmol/L — ABNORMAL HIGH (ref 134–144)
Total Protein: 6.9 g/dL (ref 6.0–8.5)
eGFR: 65 mL/min/{1.73_m2} (ref >59)

## 2022-08-31 LAB — MAGNESIUM: Magnesium: 2.1 mg/dL (ref 1.6–2.3)

## 2022-08-31 LAB — LIPID PANEL
Chol/HDL Ratio: 3.6 ratio (ref 0.0–4.4)
Cholesterol, Total: 182 mg/dL (ref 100–199)
HDL: 50 mg/dL (ref >39)
LDL Chol Calc (NIH): 113 mg/dL — ABNORMAL HIGH (ref 0–99)
Triglycerides: 104 mg/dL (ref 0–149)
VLDL Cholesterol Cal: 19 mg/dL (ref 5–40)

## 2022-08-31 LAB — VITAMIN D 25 HYDROXY (VIT D DEFICIENCY, FRACTURES): Vit D, 25-Hydroxy: 36 ng/mL (ref 30.0–100.0)

## 2022-08-31 LAB — HEMOGLOBIN A1C
Est. average glucose Bld gHb Est-mCnc: 128 mg/dL
Hgb A1c MFr Bld: 6.1 % — ABNORMAL HIGH (ref 4.8–5.6)

## 2022-08-31 LAB — TSH: TSH: 2.17 u[IU]/mL (ref 0.450–4.500)

## 2022-09-03 ENCOUNTER — Other Ambulatory Visit: Payer: Self-pay | Admitting: Family Medicine

## 2022-09-03 DIAGNOSIS — S149XXA Injury of unspecified nerves of neck, initial encounter: Secondary | ICD-10-CM

## 2022-09-03 MED ORDER — HYDROCODONE-ACETAMINOPHEN 7.5-325 MG PO TABS: 1.0000 | ORAL_TABLET | Freq: Four times a day (QID) | ORAL | 0 refills | Status: DC | PRN

## 2022-09-03 NOTE — Telephone Encounter (Signed)
Medication Refill - Medication: HYDROcodone-acetaminophen (NORCO) 7.5-325 MG tablet   Has the patient contacted their pharmacy? No.  Preferred Pharmacy (with phone number or street name):  [facility] #[id number] - [place], [place] - [address] Phone: [phone]  Fax: [phone]     Has the patient been seen for an appointment in the last year OR does the patient have an upcoming appointment? Yes.    Agent: Please be advised that RX refills may take up to 3 business days. We ask that you follow-up with your pharmacy.  

## 2022-09-03 NOTE — Telephone Encounter (Signed)
Requested medications are due for refill today. unsure  Requested medications are on the active medications list.  yes  Last refill. 08/17/2022 #30 0 rf  Future visit scheduled.   no  Notes to clinic.  Refill not delegated.     Requested Prescriptions  Pending Prescriptions Disp Refills   HYDROcodone-acetaminophen (NORCO) 7.5-325 MG tablet 30 tablet 0    Sig: Take 1 tablet by mouth every 6 (six) hours as needed for moderate pain.     Not Delegated - Analgesics:  Opioid Agonist Combinations Failed - 09/03/2022  1:16 PM      Failed - This refill cannot be delegated      Failed - Urine Drug Screen completed in last 360 days      Passed - Valid encounter within last 3 months    Recent Outpatient Visits           4 days ago Primary hypertension   [facility] [name], MD   4 months ago Benign essential HTN   [facility] [name], MD   8 months ago Pre-diabetes   [facility] [name], MD   1 year ago [place], Donald E, MD   1 year ago Hypothyroidism, unspecified type   [facility] [name], MD

## 2022-09-20 ENCOUNTER — Other Ambulatory Visit: Payer: Self-pay | Admitting: Family Medicine

## 2022-09-20 DIAGNOSIS — S149XXA Injury of unspecified nerves of neck, initial encounter: Secondary | ICD-10-CM

## 2022-09-20 NOTE — Telephone Encounter (Signed)
Medication Refill - Medication: HYDROcodone-acetaminophen (NORCO) 7.5-325 MG tablet   Has the patient contacted their pharmacy? Yes.    (Agent: If yes, when and what did the pharmacy advise?) Contact PCP office   Preferred Pharmacy (with phone number or street name):   [facility] #[id number] Lorina Rabon, [place] Phone: [phone]  Fax: [phone]      Has the patient been seen for an appointment in the last year OR does the patient have an upcoming appointment? Yes.    Agent: Please be advised that RX refills may take up to 3 business days. We ask that you follow-up with your pharmacy.

## 2022-09-21 MED ORDER — HYDROCODONE-ACETAMINOPHEN 7.5-325 MG PO TABS: 1.0000 | ORAL_TABLET | Freq: Four times a day (QID) | ORAL | 0 refills | Status: DC | PRN

## 2022-09-21 NOTE — Telephone Encounter (Signed)
Requested medication (s) are due for refill today -provider review   Requested medication (s) are on the active medication list -yes  Future visit scheduled -no  Last refill: 09/03/22 #30   Notes to clinic: non delegated Rx  Requested Prescriptions  Pending Prescriptions Disp Refills   HYDROcodone-acetaminophen (NORCO) 7.5-325 MG tablet 30 tablet 0    Sig: Take 1 tablet by mouth every 6 (six) hours as needed for moderate pain.     Not Delegated - Analgesics:  Opioid Agonist Combinations Failed - 09/20/2022 12:02 PM      Failed - This refill cannot be delegated      Failed - Urine Drug Screen completed in last 360 days      Passed - Valid encounter within last 3 months    Recent Outpatient Visits           3 weeks ago Primary hypertension   [facility] [name], MD   4 months ago Benign essential HTN   [facility] [name], MD   8 months ago [place], Donald E, MD   1 year ago [place], Donald E, MD   1 year ago Hypothyroidism, unspecified type   [facility] [name], MD                 Requested Prescriptions  Pending Prescriptions Disp Refills   HYDROcodone-acetaminophen (NORCO) 7.5-325 MG tablet 30 tablet 0    Sig: Take 1 tablet by mouth every 6 (six) hours as needed for moderate pain.     Not Delegated - Analgesics:  Opioid Agonist Combinations Failed - 09/20/2022 12:02 PM      Failed - This refill cannot be delegated      Failed - Urine Drug Screen completed in last 360 days      Passed - Valid encounter within last 3 months    Recent Outpatient Visits           3 weeks ago Primary hypertension   [facility] [name], MD   4 months ago Benign essential HTN   [facility] [name], MD   8 months ago Pre-diabetes   [facility] [name], MD    1 year ago [place], Donald E, MD   1 year ago Hypothyroidism, unspecified type   [facility] [name], MD

## 2022-10-06 ENCOUNTER — Other Ambulatory Visit: Payer: Self-pay | Admitting: Family Medicine

## 2022-10-06 DIAGNOSIS — S149XXA Injury of unspecified nerves of neck, initial encounter: Secondary | ICD-10-CM

## 2022-10-06 NOTE — Telephone Encounter (Signed)
Requested medication (s) are due for refill today - provider review   Requested medication (s) are on the active medication list -yes  Future visit scheduled -no  Last refill: 09/21/22 #30  Notes to clinic: non delegated Rx  Requested Prescriptions  Pending Prescriptions Disp Refills   HYDROcodone-acetaminophen (NORCO) 7.5-325 MG tablet 30 tablet 0    Sig: Take 1 tablet by mouth every 6 (six) hours as needed for moderate pain.     Not Delegated - Analgesics:  Opioid Agonist Combinations Failed - 10/06/2022  9:18 AM      Failed - This refill cannot be delegated      Failed - Urine Drug Screen completed in last 360 days      Passed - Valid encounter within last 3 months    Recent Outpatient Visits           1 month ago Primary hypertension   [facility] [name], MD   5 months ago Benign essential HTN   [facility] [name], MD   9 months ago [place], Donald E, MD   1 year ago [place], Donald E, MD   1 year ago Hypothyroidism, unspecified type   [facility] [name], MD                 Requested Prescriptions  Pending Prescriptions Disp Refills   HYDROcodone-acetaminophen (NORCO) 7.5-325 MG tablet 30 tablet 0    Sig: Take 1 tablet by mouth every 6 (six) hours as needed for moderate pain.     Not Delegated - Analgesics:  Opioid Agonist Combinations Failed - 10/06/2022  9:18 AM      Failed - This refill cannot be delegated      Failed - Urine Drug Screen completed in last 360 days      Passed - Valid encounter within last 3 months    Recent Outpatient Visits           1 month ago Primary hypertension   [facility] [name], MD   5 months ago Benign essential HTN   [facility] [name], MD   9 months ago [place], Donald E, MD    1 year ago [place], Donald E, MD   1 year ago Hypothyroidism, unspecified type   [facility] [name], MD

## 2022-10-06 NOTE — Telephone Encounter (Signed)
Medication Refill - Medication: HYDROcodone-acetaminophen (NORCO) 7.5-325 MG tablet   Has the patient contacted their pharmacy? Yes.   No, more refills.   (Agent: If yes, when and what did the pharmacy advise?) [facility] [id number], [place]  [place] [address]  Phone: [phone] Fax: [phone]  Hours: Not open 24 hours   Preferred Pharmacy (with phone number or street name):  Has the patient been seen for an appointment in the last year OR does the patient have an upcoming appointment? Yes.    Agent: Please be advised that RX refills may take up to 3 business days. We ask that you follow-up with your pharmacy.

## 2022-10-07 MED ORDER — HYDROCODONE-ACETAMINOPHEN 7.5-325 MG PO TABS: 1.0000 | ORAL_TABLET | Freq: Four times a day (QID) | ORAL | 0 refills | Status: DC | PRN

## 2022-10-10 ENCOUNTER — Other Ambulatory Visit: Payer: Self-pay | Admitting: Family Medicine

## 2022-10-10 DIAGNOSIS — S149XXA Injury of unspecified nerves of neck, initial encounter: Secondary | ICD-10-CM

## 2022-10-12 MED ORDER — HYDROCODONE-ACETAMINOPHEN 7.5-325 MG PO TABS: 1.0000 | ORAL_TABLET | Freq: Four times a day (QID) | ORAL | 0 refills | Status: DC | PRN

## 2022-10-12 NOTE — Telephone Encounter (Signed)
[email or web address]

## 2022-10-26 ENCOUNTER — Other Ambulatory Visit: Payer: Self-pay | Admitting: Family Medicine

## 2022-10-26 DIAGNOSIS — S149XXA Injury of unspecified nerves of neck, initial encounter: Secondary | ICD-10-CM

## 2022-10-26 DIAGNOSIS — Z720 Tobacco use: Secondary | ICD-10-CM

## 2022-10-26 MED ORDER — HYDROCODONE-ACETAMINOPHEN 7.5-325 MG PO TABS: 1.0000 | ORAL_TABLET | Freq: Four times a day (QID) | ORAL | 0 refills | Status: DC | PRN

## 2022-10-26 MED ORDER — NICOTINE 7 MG/24HR TD PT24: 7.0000 mg | MEDICATED_PATCH | Freq: Every day | TRANSDERMAL | 0 refills | Status: DC

## 2022-11-01 IMAGING — MG MM DIGITAL SCREENING BILAT W/ TOMO AND CAD
8 series · 8 of 24 positions shown · non-contrast
Comparison: Previous exam(s).

CLINICAL DATA: Screening.

EXAM:
DIGITAL SCREENING BILATERAL MAMMOGRAM WITH TOMOSYNTHESIS AND CAD
TECHNIQUE: Bilateral screening digital craniocaudal and mediolateral oblique
mammograms were obtained. Bilateral screening digital breast
tomosynthesis was performed. The images were evaluated with
computer-aided detection.

[L CC synth-2D]
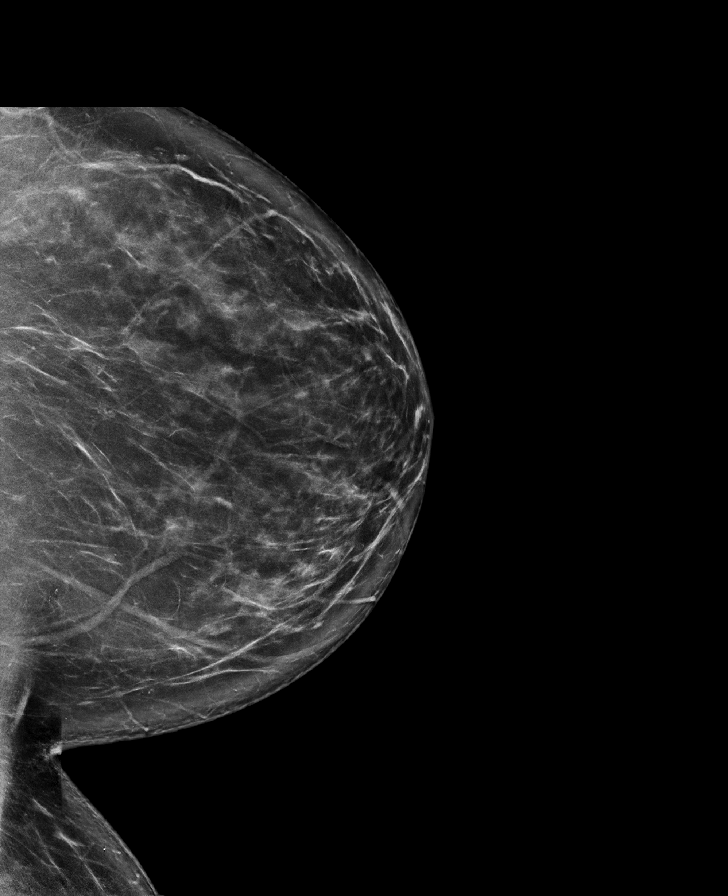

[L MLO synth-2D]
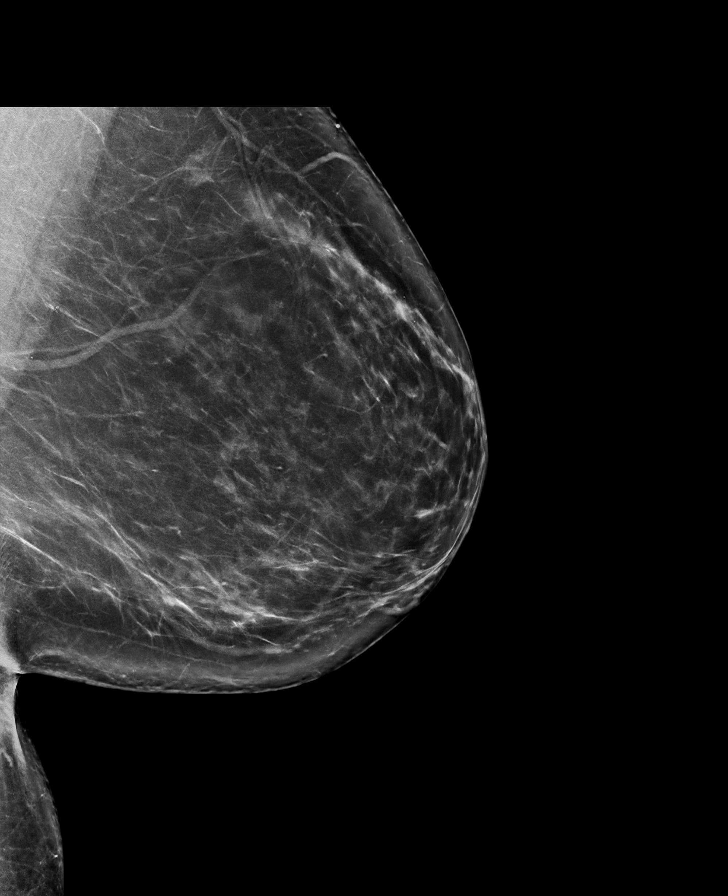

[R MLO synth-2D]
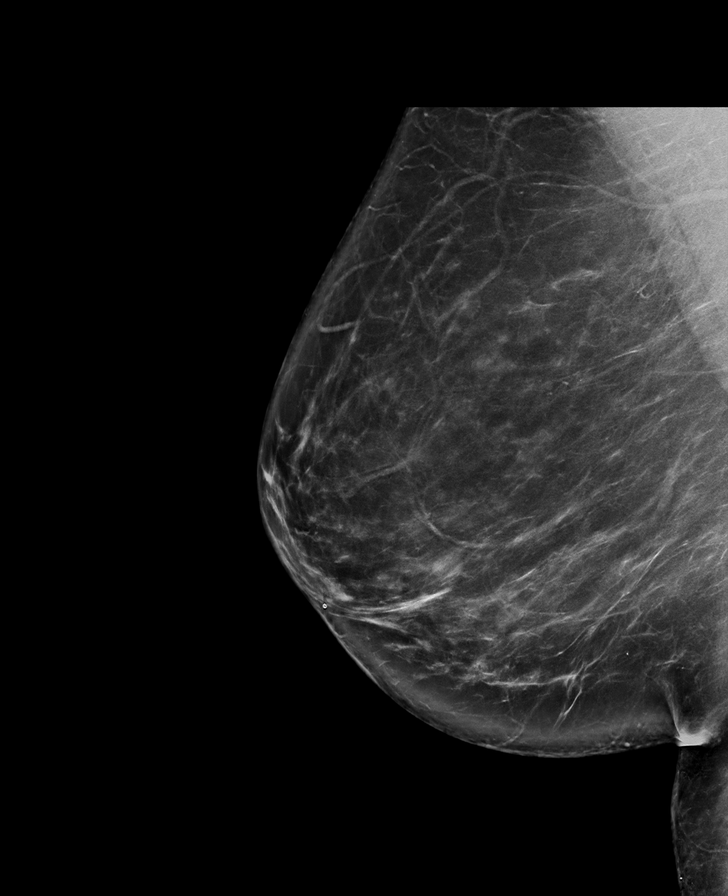

[R CC synth-2D]
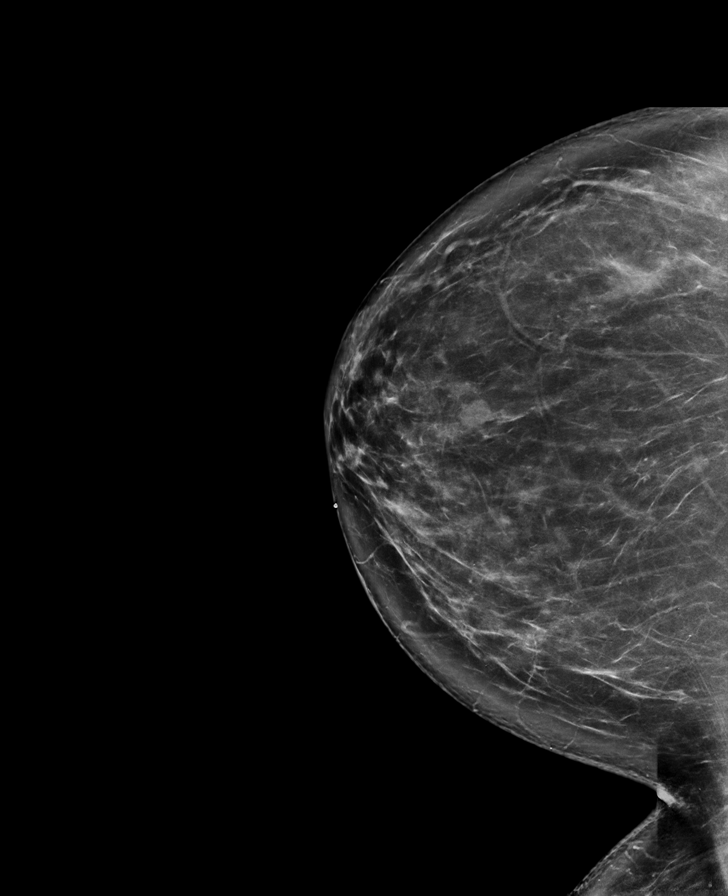

[L CC tomo · tomo slice 45/90.0]
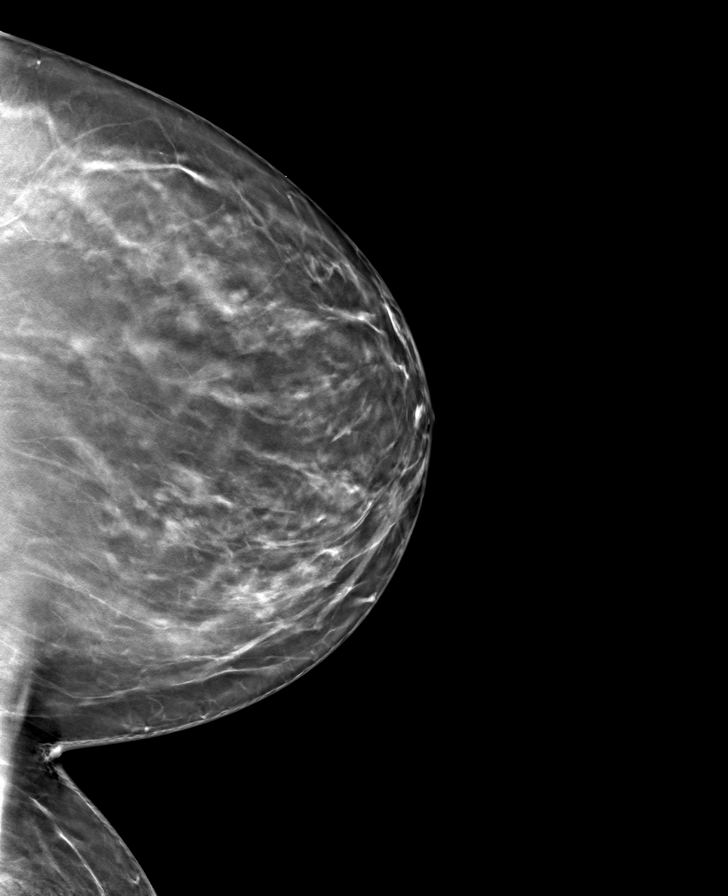

[R MLO tomo · tomo slice 51/101.0]
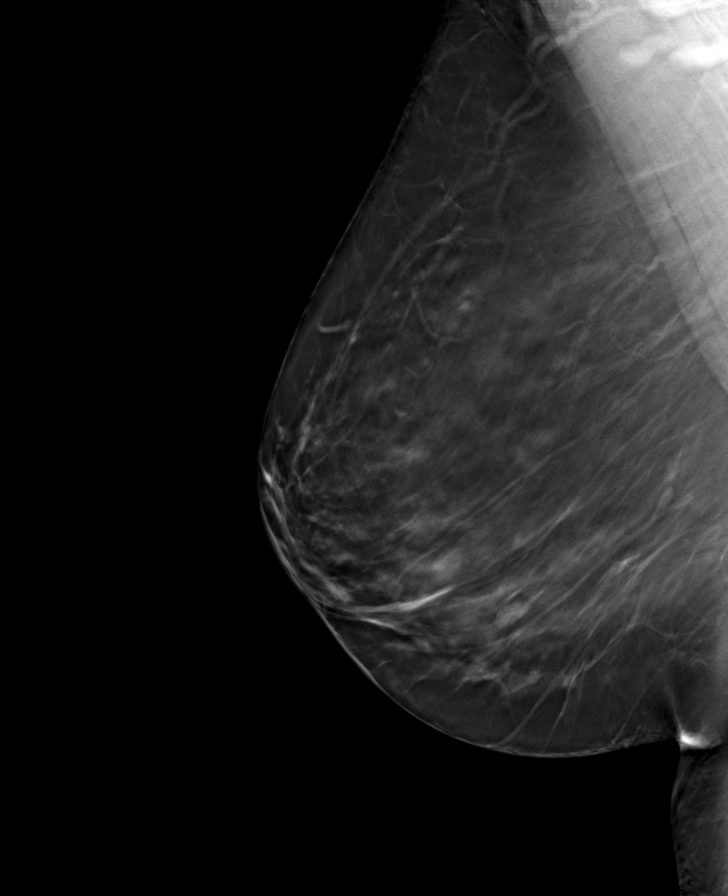

[L MLO tomo · tomo slice 47/92.0]
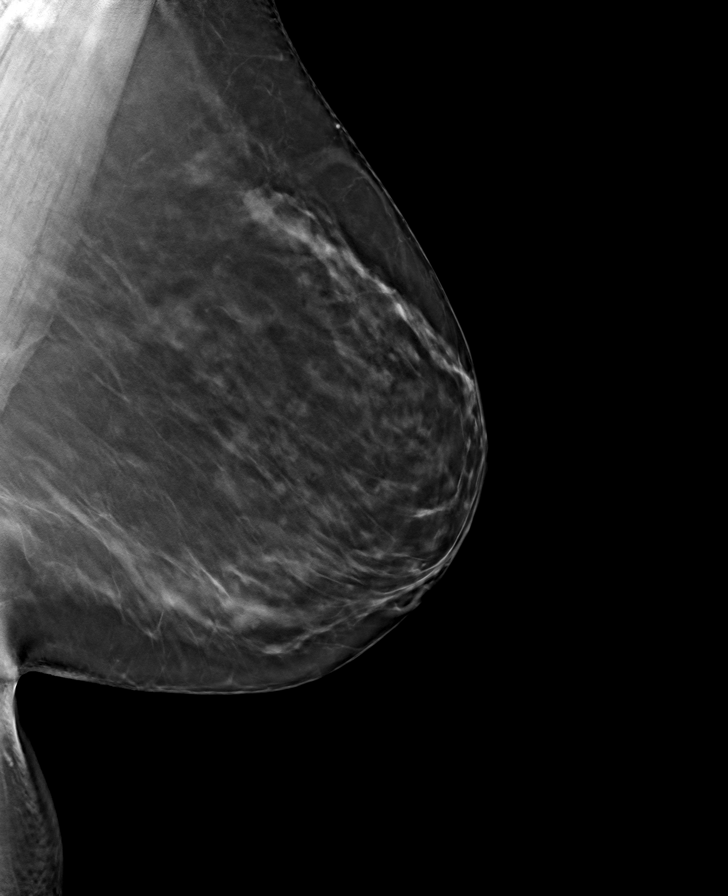

[R CC tomo · tomo slice 48/95.0]
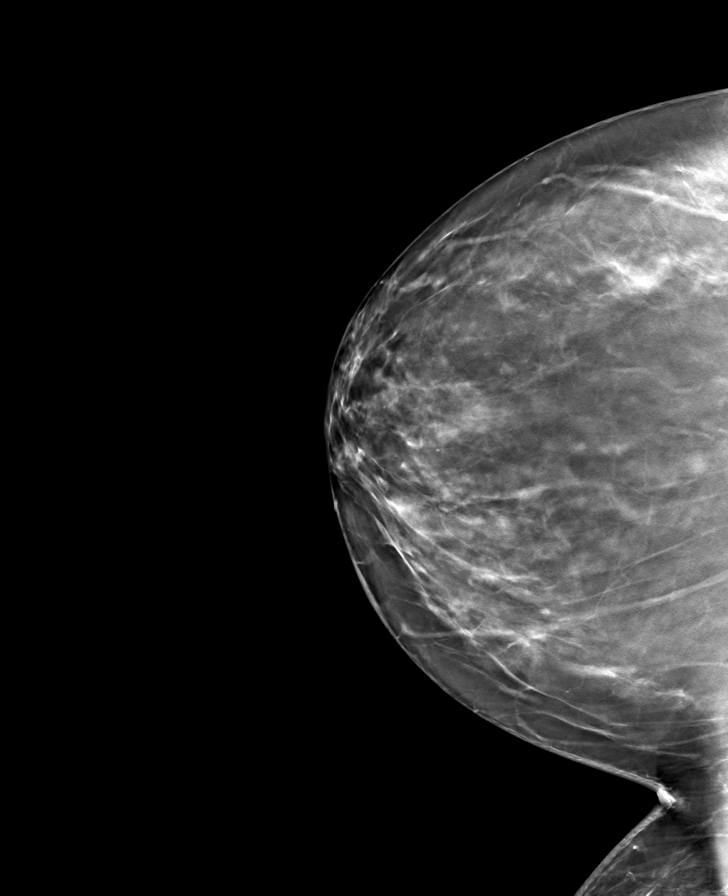

[8 of 24 positions shown; findings below may reference images not displayed]

ACR Breast Density Category b: There are scattered areas of
fibroglandular density.
FINDINGS: There are no findings suspicious for malignancy.
IMPRESSION: No mammographic evidence of malignancy. A result letter of this
screening mammogram will be mailed directly to the patient.

RECOMMENDATION:
Screening mammogram in one year. (Code:51-O-LD2)

BI-RADS CATEGORY  1: Negative.

## 2022-11-15 ENCOUNTER — Other Ambulatory Visit: Payer: Self-pay | Admitting: Family Medicine

## 2022-11-15 DIAGNOSIS — S149XXA Injury of unspecified nerves of neck, initial encounter: Secondary | ICD-10-CM

## 2022-11-15 NOTE — Telephone Encounter (Signed)
Last refill 10/26/22. Please advise

## 2022-11-16 MED ORDER — HYDROCODONE-ACETAMINOPHEN 7.5-325 MG PO TABS: 1.0000 | ORAL_TABLET | Freq: Four times a day (QID) | ORAL | 0 refills | Status: DC | PRN

## 2022-11-29 ENCOUNTER — Other Ambulatory Visit: Payer: Self-pay | Admitting: Family Medicine

## 2022-11-29 DIAGNOSIS — S149XXA Injury of unspecified nerves of neck, initial encounter: Secondary | ICD-10-CM

## 2022-11-29 MED ORDER — HYDROCODONE-ACETAMINOPHEN 7.5-325 MG PO TABS: 1.0000 | ORAL_TABLET | Freq: Four times a day (QID) | ORAL | 0 refills | Status: DC | PRN

## 2022-12-09 ENCOUNTER — Other Ambulatory Visit: Payer: Self-pay | Admitting: Family Medicine

## 2022-12-09 DIAGNOSIS — S149XXA Injury of unspecified nerves of neck, initial encounter: Secondary | ICD-10-CM

## 2022-12-09 DIAGNOSIS — Z720 Tobacco use: Secondary | ICD-10-CM

## 2022-12-10 MED ORDER — HYDROCODONE-ACETAMINOPHEN 7.5-325 MG PO TABS: 1.0000 | ORAL_TABLET | Freq: Four times a day (QID) | ORAL | 0 refills | Status: DC | PRN

## 2022-12-10 MED ORDER — NICOTINE 7 MG/24HR TD PT24: 7.0000 mg | MEDICATED_PATCH | Freq: Every day | TRANSDERMAL | 0 refills | Status: DC

## 2022-12-14 ENCOUNTER — Other Ambulatory Visit: Payer: Self-pay | Admitting: Family Medicine

## 2022-12-16 NOTE — Progress Notes (Signed)
[place] Ponder Ladora Osterberg,acting as a scribe for Lelon Huh, MD.,have documented all relevant documentation on the behalf of Lelon Huh, MD,as directed by  Lelon Huh, MD while in the presence of Lelon Huh, MD.     Established patient visit   Patient: Jamie Williams   DOB: 1961/02/15   63 y.o. Female  MRN: [id number] Visit Date: 12/17/2022  Today's healthcare provider: Lelon Huh, MD   No chief complaint on file.  Subjective    HPI  Chronic Pain, Follow up  Patient is a 62 year old female who presents for follow up of chronic pain and refill of pain medication.  She was last seen 3 months ago.  She also requests refill of her albuterol inhaler which she has not filled for a few years. She has been working on smoking cessation and was cut back to 1 cigarette per day and continues on 39mg patch.   --------------------------------------------------------------------------------------------------- Prediabetes, Follow-up  Lab Results  Component Value Date   HGBA1C 6.1 (H) 08/30/2022   HGBA1C 5.6 12/28/2021   HGBA1C 6.1 (H) 07/03/2021   GLUCOSE 112 (H) 08/30/2022   GLUCOSE 118 (H) 07/03/2021   GLUCOSE 117 (H) 10/06/2020    Last seen for for this3 months ago.  Management since that visit includes none.      Current diet:  none Current exercise: none  Pertinent Labs:    Component Value Date/Time   CHOL 182 08/30/2022 0929   TRIG 104 08/30/2022 0929   CHOLHDL 3.6 08/30/2022 0929   CREATININE 1.00 08/30/2022 0929   CREATININE 0.93 11/23/2014 1612    Wt Readings from Last 3 Encounters:  12/17/22 186 lb (84.4 kg)  08/30/22 180 lb (81.6 kg)  07/29/22 182 lb (82.6 kg)    ----------------------------------------------------------------------------------------- Hypertension, follow-up  BP Readings from Last 3 Encounters:  12/17/22 139/89  08/30/22 (!) 149/84  04/30/22 (!) 176/96   Wt Readings from Last 3 Encounters:  12/17/22 186 lb (84.4 kg)  08/30/22 180  lb (81.6 kg)  07/29/22 182 lb (82.6 kg)     She was last seen for hypertension 3 months ago.  BP at that visit was as above. Management since that visit includes none.  She reports good compliance with treatment. She is not having side effects.  She does smoke.  Use of agents associated with hypertension: none.   Outside blood pressures are does not have record with her but states it is running about what we got today. Symptoms: No chest pain No chest pressure  No palpitations No syncope  No dyspnea No orthopnea  No paroxysmal nocturnal dyspnea No lower extremity edema   Pertinent labs Lab Results  Component Value Date   CHOL 182 08/30/2022   HDL 50 08/30/2022   LDLCALC 113 (H) 08/30/2022   TRIG 104 08/30/2022   CHOLHDL 3.6 08/30/2022   Lab Results  Component Value Date   NA 145 (H) 08/30/2022   K 4.5 08/30/2022   CREATININE 1.00 08/30/2022   EGFR 65 08/30/2022   GLUCOSE 112 (H) 08/30/2022   TSH 2.170 08/30/2022     The 10-year ASCVD risk score (Arnett DK, et al., 2019) is: 16%  ---------------------------------------------------------------------------------------------------    Medications: Outpatient Medications Prior to Visit  Medication Sig   albuterol (VENTOLIN HFA) 108 (90 Base) MCG/ACT inhaler Inhale 2 puffs into the lungs every 6 (six) hours as needed for wheezing or shortness of breath.   amLODipine (NORVASC) 10 MG tablet Take 1 tablet (10 mg total) by mouth every  evening.   cholecalciferol (VITAMIN D) 1000 units tablet Take 2,000 Units by mouth daily.    estradiol (ESTRACE) 2 MG tablet TAKE 1 TABLET(2 MG) BY MOUTH DAILY   gabapentin (NEURONTIN) 300 MG capsule TAKE 1 CAPSULE BY MOUTH THREE TIMES A DAY   hydrochlorothiazide (HYDRODIURIL) 25 MG tablet TAKE 1 TABLET(25 MG) BY MOUTH DAILY   HYDROcodone-acetaminophen (NORCO) 7.5-325 MG tablet Take 1 tablet by mouth every 6 (six) hours as needed for moderate pain.   levothyroxine (SYNTHROID) 50 MCG tablet  TAKE 1 TABLET(50 MCG) BY MOUTH DAILY   meloxicam (MOBIC) 15 MG tablet TAKE 1 TABLET(15 MG) BY MOUTH DAILY WITH FOOD   nicotine (NICODERM CQ) 7 mg/24hr patch Place 1 patch (7 mg total) onto the skin daily.   PARoxetine (PAXIL) 30 MG tablet TAKE 2 TABLETS(60 MG) BY MOUTH DAILY   pravastatin (PRAVACHOL) 40 MG tablet TAKE 1 TABLET(40 MG) BY MOUTH DAILY   promethazine (PHENERGAN) 25 MG tablet TAKE 1 TABLET(25 MG) BY MOUTH EVERY 6 HOURS AS NEEDED FOR NAUSEA OR VOMITING   buPROPion (WELLBUTRIN SR) 150 MG 12 hr tablet Take 1 tablet (150 mg total) by mouth 2 (two) times daily. (Patient not taking: Reported on 12/17/2022)   promethazine-codeine (PHENERGAN WITH CODEINE) 6.25-10 MG/5ML syrup Take 5-10 mLs by mouth at bedtime. (Patient not taking: Reported on 12/17/2022)   No facility-administered medications prior to visit.    Review of Systems  Constitutional:  Negative for appetite change, chills, fatigue and fever.  Respiratory:  Negative for chest tightness and shortness of breath.   Cardiovascular:  Negative for chest pain and palpitations.  Gastrointestinal:  Negative for abdominal pain, nausea and vomiting.  Neurological:  Negative for dizziness and weakness.        Objective    BP 139/89 (BP Location: Right Arm, Patient Position: Sitting, Cuff Size: Normal)   Pulse 78   Temp 98.1 F (36.7 C) (Oral)   Wt 186 lb (84.4 kg)   SpO2 96%   BMI 30.95 kg/m    Vitals:   12/17/22 1041 12/17/22 1042  BP: (!) 147/87 139/89  Pulse: 78   Temp: 98.1 F (36.7 C)   TempSrc: Oral   SpO2: 96%   Weight: 186 lb (84.4 kg)      Physical Exam  General appearance: Mildly obese female, cooperative and in no acute distress Head: Normocephalic, without obvious abnormality, atraumatic Respiratory: Respirations even and unlabored, normal respiratory rate Extremities: All extremities are intact.  Skin: Skin color, texture, turgor normal. No rashes seen  Psych: Appropriate mood and affect. Neurologic:  Mental status: Alert, oriented to person, place, and time, thought content appropriate.   Results for orders placed or performed in visit on 12/17/22  POCT glycosylated hemoglobin (Hb A1C)  Result Value Ref Range   Hemoglobin A1C 6.0 (A) 4.0 - 5.6 %   Est. average glucose Bld gHb Est-mCnc 126     Assessment & Plan     1. Pre-diabetes Stable. Encourage continue healthy diet.   2. Mild intermittent reactive airway disease without complication Rarely requires inhaler, but is currently out.  - albuterol (VENTOLIN HFA) 108 (90 Base) MCG/ACT inhaler; Inhale 2 puffs into the lungs every 6 (six) hours as needed for wheezing or shortness of breath.  Dispense: 18 g; Refill: 5  3. Tobacco abuse Down to 1 cigarette per day using 28mg patch.   4. Neck pain Pain fairly well controlled on current medication regiment.   Follow up 6 months.  The entirety of the information documented in the History of Present Illness, Review of Systems and Physical Exam were personally obtained by me. Portions of this information were initially documented by the CMA and reviewed by me for thoroughness and accuracy.     Lelon Huh, MD  [place] [phone] (phone) [phone] (fax)  [place]

## 2022-12-17 ENCOUNTER — Ambulatory Visit (INDEPENDENT_AMBULATORY_CARE_PROVIDER_SITE_OTHER): Payer: BC Managed Care – PPO | Admitting: Family Medicine

## 2022-12-17 VITALS — BP 139/89 | HR 78 | Temp 98.1°F | Wt 186.0 lb

## 2022-12-17 DIAGNOSIS — M542 Cervicalgia: Secondary | ICD-10-CM | POA: Diagnosis not present

## 2022-12-17 DIAGNOSIS — R7303 Prediabetes: Secondary | ICD-10-CM

## 2022-12-17 DIAGNOSIS — J4520 Mild intermittent asthma, uncomplicated: Secondary | ICD-10-CM

## 2022-12-17 DIAGNOSIS — Z720 Tobacco use: Secondary | ICD-10-CM | POA: Diagnosis not present

## 2022-12-17 LAB — POCT GLYCOSYLATED HEMOGLOBIN (HGB A1C)
Est. average glucose Bld gHb Est-mCnc: 126
Hemoglobin A1C: 6 % — AB (ref 4.0–5.6)

## 2022-12-17 MED ORDER — ALBUTEROL SULFATE HFA 108 (90 BASE) MCG/ACT IN AERS: 2.0000 | INHALATION_SPRAY | Freq: Four times a day (QID) | RESPIRATORY_TRACT | 5 refills | Status: DC | PRN

## 2022-12-17 NOTE — Patient Instructions (Signed)
.   Please review the attached list of medications and notify my office if there are any errors.   . Please bring all of your medications to every appointment so we can make sure that our medication list is the same as yours.   

## 2022-12-31 ENCOUNTER — Other Ambulatory Visit: Payer: Self-pay | Admitting: Family Medicine

## 2022-12-31 DIAGNOSIS — S149XXA Injury of unspecified nerves of neck, initial encounter: Secondary | ICD-10-CM

## 2023-01-04 ENCOUNTER — Other Ambulatory Visit: Payer: Self-pay | Admitting: Family Medicine

## 2023-01-04 DIAGNOSIS — S149XXA Injury of unspecified nerves of neck, initial encounter: Secondary | ICD-10-CM

## 2023-01-05 MED ORDER — HYDROCODONE-ACETAMINOPHEN 7.5-325 MG PO TABS: 1.0000 | ORAL_TABLET | Freq: Four times a day (QID) | ORAL | 0 refills | Status: DC | PRN

## 2023-01-09 ENCOUNTER — Other Ambulatory Visit: Payer: Self-pay | Admitting: Family Medicine

## 2023-01-31 ENCOUNTER — Other Ambulatory Visit: Payer: Self-pay | Admitting: Family Medicine

## 2023-01-31 DIAGNOSIS — N951 Menopausal and female climacteric states: Secondary | ICD-10-CM

## 2023-03-01 ENCOUNTER — Other Ambulatory Visit: Payer: Self-pay | Admitting: Family Medicine

## 2023-03-01 DIAGNOSIS — E039 Hypothyroidism, unspecified: Secondary | ICD-10-CM

## 2023-03-18 ENCOUNTER — Other Ambulatory Visit: Payer: Self-pay | Admitting: Family Medicine

## 2023-03-18 DIAGNOSIS — Z720 Tobacco use: Secondary | ICD-10-CM

## 2023-03-19 MED ORDER — NICOTINE 7 MG/24HR TD PT24: 7.0000 mg | MEDICATED_PATCH | Freq: Every day | TRANSDERMAL | 1 refills | Status: DC

## 2023-03-30 ENCOUNTER — Other Ambulatory Visit: Payer: Self-pay | Admitting: Family Medicine

## 2023-06-08 ENCOUNTER — Ambulatory Visit (INDEPENDENT_AMBULATORY_CARE_PROVIDER_SITE_OTHER): Payer: BC Managed Care – PPO | Admitting: Physician Assistant

## 2023-06-08 ENCOUNTER — Other Ambulatory Visit: Payer: Self-pay | Admitting: Family Medicine

## 2023-06-08 ENCOUNTER — Encounter: Payer: Self-pay | Admitting: Physician Assistant

## 2023-06-08 VITALS — BP 139/85 | HR 69 | Temp 98.3°F | Resp 20 | Ht 65.0 in | Wt 184.7 lb

## 2023-06-08 DIAGNOSIS — H1032 Unspecified acute conjunctivitis, left eye: Secondary | ICD-10-CM | POA: Diagnosis not present

## 2023-06-08 DIAGNOSIS — H00036 Abscess of eyelid left eye, unspecified eyelid: Secondary | ICD-10-CM | POA: Diagnosis not present

## 2023-06-08 DIAGNOSIS — J302 Other seasonal allergic rhinitis: Secondary | ICD-10-CM

## 2023-06-08 MED ORDER — ERYTHROMYCIN 5 MG/GM OP OINT: 1.0000 | TOPICAL_OINTMENT | Freq: Every day | OPHTHALMIC | 0 refills | Status: DC

## 2023-06-08 MED ORDER — CEFDINIR 300 MG PO CAPS: 300.0000 mg | ORAL_CAPSULE | Freq: Two times a day (BID) | ORAL | 0 refills | Status: DC

## 2023-06-08 NOTE — Progress Notes (Unsigned)
Established patient visit  Patient: Jamie Williams   DOB: 28-Mar-1961   62 y.o. Female  MRN: [id number] Visit Date: 06/08/2023  Today's healthcare provider: Debera Lat, PA-C   No chief complaint on file.  Subjective    HPI HPI   Patient C/O left eye watering, burning and is causing headaches since yesterday. Last edited by Myles Lipps, CMA on 06/08/2023 11:00 AM.      *** Discussed the use of AI scribe software for clinical note transcription with the patient, who gave verbal consent to proceed.  History of Present Illness   The patient presents with left eye pain and discomfort that started yesterday. She woke up with a watery eye that became painful upon rubbing. The pain is associated with a headache on the left side. The patient denies that the pain is related to her known migraines. The eye pain increases when she moves her eye upwards. She has a history of conjunctivitis, but she reports that the current pain is less severe than her previous experience. The patient also reports that her eye was sticky upon waking up. She has been sneezing and has nasal congestion, which she attributes to her seasonal allergies. She has been previously prescribed nasal spray for her allergies, but she stopped using it after a while. The patient denies any chest pain, shortness of breath, rapid heartbeat, or wheezing.           06/08/2023   11:06 AM 07/29/2022    3:20 PM 04/30/2022   11:33 AM  Depression screen PHQ 2/9  Decreased Interest 0 0 0  Down, Depressed, Hopeless 1 0 0  PHQ - 2 Score 1 0 0  Altered sleeping 1 0 1  Tired, decreased energy 0 0 0  Change in appetite 1 0 1  Feeling bad or failure about yourself  0 0 0  Trouble concentrating 0 0 0  Moving slowly or fidgety/restless 0 0 0  Suicidal thoughts 0 0 0  PHQ-9 Score 3 0 2  Difficult doing work/chores Not difficult at all Not difficult at all Not difficult at all       No data to display           Medications: Outpatient Medications Prior to Visit  Medication Sig   albuterol (VENTOLIN HFA) 108 (90 Base) MCG/ACT inhaler Inhale 2 puffs into the lungs every 6 (six) hours as needed for wheezing or shortness of breath.   amLODipine (NORVASC) 10 MG tablet Take 1 tablet (10 mg total) by mouth every evening.   cholecalciferol (VITAMIN D) 1000 units tablet Take 2,000 Units by mouth daily.    estradiol (ESTRACE) 2 MG tablet TAKE 1 TABLET(2 MG) BY MOUTH DAILY   gabapentin (NEURONTIN) 300 MG capsule TAKE 1 CAPSULE BY MOUTH THREE TIMES A DAY   hydrochlorothiazide (HYDRODIURIL) 25 MG tablet TAKE 1 TABLET(25 MG) BY MOUTH DAILY   levothyroxine (SYNTHROID) 50 MCG tablet TAKE 1 TABLET BY MOUTH EVERY DAY   meloxicam (MOBIC) 15 MG tablet TAKE 1 TABLET(15 MG) BY MOUTH DAILY WITH FOOD   nicotine (NICODERM CQ) 7 mg/24hr patch Place 1 patch (7 mg total) onto the skin daily.   PARoxetine (PAXIL) 30 MG tablet TAKE 2 TABLETS(60 MG) BY MOUTH DAILY   pravastatin (PRAVACHOL) 40 MG tablet TAKE 1 TABLET(40 MG) BY MOUTH DAILY   promethazine (PHENERGAN) 25 MG tablet TAKE 1 TABLET(25 MG) BY MOUTH EVERY 6 HOURS AS NEEDED FOR NAUSEA OR VOMITING   [DISCONTINUED] buPROPion (WELLBUTRIN SR) 150  MG 12 hr tablet Take 1 tablet (150 mg total) by mouth 2 (two) times daily. (Patient not taking: Reported on 12/17/2022)   [DISCONTINUED] HYDROcodone-acetaminophen (NORCO) 7.5-325 MG tablet Take 1 tablet by mouth every 6 (six) hours as needed for moderate pain.   [DISCONTINUED] promethazine-codeine (PHENERGAN WITH CODEINE) 6.25-10 MG/5ML syrup Take 5-10 mLs by mouth at bedtime. (Patient not taking: Reported on 12/17/2022)   No facility-administered medications prior to visit.    Review of Systems Except see HPI   {Insert previous labs (optional):23779} {See past labs  Heme  Chem  Endocrine  Serology  Results Review (optional):1}   Objective    BP 139/85 (BP Location: Left Arm, Patient Position: Sitting, Cuff Size: Large)    Pulse 69   Temp 98.3 F (36.8 C) (Temporal)   Resp 20   Ht 5\' 5"  (1.651 m)   Wt 184 lb 11.2 oz (83.8 kg)   SpO2 99%   BMI 30.74 kg/m  {Insert last BP/Wt (optional):23777}{See vitals history (optional):1}   Physical Exam   No results found for any visits on 06/08/23.  Assessment & Plan    *** Assessment and Plan    Left Eye Pain and Discharge Onset yesterday with associated headache. Pain on upward gaze. History of conjunctivitis. Eye appears puffy. Possible bacterial conjunctivitis. -Prescribe antibiotic ointment. -Advise warm compresses with tea bags, ensuring not to cross-contaminate both eyes.  Seasonal Allergies Currently symptomatic with sneezing and nasal congestion. Post nasal drainage noted on examination. History of using Flonase and found it helpful. -Advise over-the-counter antihistamines (Allegra or Claritin). -Recommend over-the-counter nasal saline spray or rinse (Netiport). -Advise to use Flonase, two puffs in each nostril daily for seven days, then one puff in each nostril until symptoms subside. -Advise over-the-counter allergy eye drops (Zavitor or similar).  Penicillin, Sulfur Drug, and Aspirin Allergy Noted rash as allergic reaction. -Prescribe Cephalosporin 300mg , 1 tablet twice daily for 5-10 days. Instruct patient to take one tablet and stop if any allergic reaction occurs.         No follow-ups on file.     The patient was advised to call back or seek an in-person evaluation if the symptoms worsen or if the condition fails to improve as anticipated.  I discussed the assessment and treatment plan with the patient. The patient was provided an opportunity to ask questions and all were answered. The patient agreed with the plan and demonstrated an understanding of the instructions.  I, Debera Lat, PA-C have reviewed all documentation for this visit. The documentation on  06/08/23 for the exam, diagnosis, procedures, and orders are all accurate and  complete.  Debera Lat, [facility], MMS [facility] [phone] (phone) [phone] (fax)  [facility]

## 2023-06-09 NOTE — Telephone Encounter (Signed)
Requested medication (s) are due for refill today - yes  Requested medication (s) are on the active medication list -yes  Future visit scheduled -no  Last refill: 03/31/23 #30 1RF  Notes to clinic: non delegated Rx  Requested Prescriptions  Pending Prescriptions Disp Refills   promethazine (PHENERGAN) 25 MG tablet [Pharmacy Med Name: PROMETHAZINE 25MG  TABLETS] 30 tablet 1    Sig: TAKE 1 TABLET(25 MG) BY MOUTH EVERY 6 HOURS AS NEEDED FOR NAUSEA OR VOMITING     Not Delegated - Gastroenterology: Antiemetics Failed - 06/08/2023 11:47 AM      Failed - This refill cannot be delegated      Passed - Valid encounter within last 6 months    Recent Outpatient Visits           Yesterday Eyelid cellulitis, left   [place] [facility] [place], [place], PA-C   5 months ago Pre-diabetes   [facility] Malva Limes, MD   9 months ago Primary hypertension   [place] [facility] Malva Limes, MD   1 year ago Benign essential HTN   [place] [facility] Malva Limes, MD   1 year ago Pre-diabetes   [place] [facility] Malva Limes, MD                 Requested Prescriptions  Pending Prescriptions Disp Refills   promethazine (PHENERGAN) 25 MG tablet [Pharmacy Med Name: PROMETHAZINE 25MG  TABLETS] 30 tablet 1    Sig: TAKE 1 TABLET(25 MG) BY MOUTH EVERY 6 HOURS AS NEEDED FOR NAUSEA OR VOMITING     Not Delegated - Gastroenterology: Antiemetics Failed - 06/08/2023 11:47 AM      Failed - This refill cannot be delegated      Passed - Valid encounter within last 6 months    Recent Outpatient Visits           Yesterday Eyelid cellulitis, left   [place] [facility] [place], [place], PA-C   5 months ago Pre-diabetes   [facility] Malva Limes, MD   9 months ago Primary hypertension   [place] [facility] Malva Limes, MD   1 year ago Benign essential HTN   [place] [facility] Malva Limes, MD   1 year ago Pre-diabetes   [facility] Malva Limes, MD

## 2023-06-14 ENCOUNTER — Other Ambulatory Visit: Payer: Self-pay | Admitting: Family Medicine

## 2023-06-14 DIAGNOSIS — R202 Paresthesia of skin: Secondary | ICD-10-CM

## 2023-06-14 DIAGNOSIS — I10 Essential (primary) hypertension: Secondary | ICD-10-CM

## 2023-06-21 ENCOUNTER — Other Ambulatory Visit: Payer: Self-pay | Admitting: Family Medicine

## 2023-06-21 DIAGNOSIS — Z1231 Encounter for screening mammogram for malignant neoplasm of breast: Secondary | ICD-10-CM

## 2023-06-23 ENCOUNTER — Ambulatory Visit
Admission: RE | Admit: 2023-06-23 | Discharge: 2023-06-23 | Disposition: A | Discharge status: Home / Self Care | Payer: BC Managed Care – PPO | Source: Ambulatory Visit | Attending: Family Medicine | Admitting: Family Medicine

## 2023-06-23 DIAGNOSIS — Z1231 Encounter for screening mammogram for malignant neoplasm of breast: Secondary | ICD-10-CM | POA: Diagnosis not present

## 2023-07-13 ENCOUNTER — Other Ambulatory Visit: Payer: Self-pay | Admitting: Family Medicine

## 2023-07-13 DIAGNOSIS — F32A Depression, unspecified: Secondary | ICD-10-CM

## 2023-07-14 NOTE — Telephone Encounter (Signed)
Requested Prescriptions  Pending Prescriptions Disp Refills   PARoxetine (PAXIL) 30 MG tablet [Pharmacy Med Name: PAROXETINE 30MG  TABLETS] 180 tablet 0    Sig: TAKE 2 TABLETS(60 MG) BY MOUTH DAILY     Psychiatry:  Antidepressants - SSRI Passed - 07/13/2023  1:34 PM      Passed - Completed PHQ-2 or PHQ-9 in the last 360 days      Passed - Valid encounter within last 6 months    Recent Outpatient Visits           1 month ago Eyelid cellulitis, left   [place] [facility] [place], [place], PA-C   6 months ago Pre-diabetes   [facility] Malva Limes, MD   10 months ago Primary hypertension   [place] [facility] Malva Limes, MD   1 year ago Benign essential HTN   [place] [facility] Malva Limes, MD   1 year ago Pre-diabetes   [facility] Malva Limes, MD

## 2023-07-15 ENCOUNTER — Other Ambulatory Visit: Payer: Self-pay | Admitting: Family Medicine

## 2023-07-15 DIAGNOSIS — I700 Atherosclerosis of aorta: Secondary | ICD-10-CM

## 2023-07-15 DIAGNOSIS — I10 Essential (primary) hypertension: Secondary | ICD-10-CM

## 2023-08-13 DIAGNOSIS — S46911A Strain of unspecified muscle, fascia and tendon at shoulder and upper arm level, right arm, initial encounter: Secondary | ICD-10-CM | POA: Diagnosis not present

## 2023-08-15 ENCOUNTER — Ambulatory Visit: Payer: Self-pay | Admitting: *Deleted

## 2023-08-15 NOTE — Telephone Encounter (Signed)
  Chief Complaint: severe pain right shoulder after reaching for something on Thursday. Seen in UC and told to f/u for possible MRI. Now unable to lift arm  Symptoms: swelling right shoulder to elbow. Can move fingers but severe pain and pain level decreased with robaxin , now neck becoming stiff as well. Frequency: Thursday  Pertinent Negatives: Patient denies whole arm swollen. No N/T  Disposition: [] ED /[] Urgent Care (no appt availability in office) / [x] Appointment(In office/virtual)/ []  [facility] [facility]/ [] Home Care/ [] Refused Recommended Disposition /[] [facility]/ []  Follow-up with PCP Additional Notes:   Same day appt with L. Angelica Chessman, PA due to no available OV with PCP .        Reason for Disposition  Weakness (i.e., loss of strength) in hand or fingers  (Exception: Not truly weak; hand feels weak because of pain.)  Answer Assessment - Initial Assessment Questions 1. ONSET: "When did the pain start?"     Thursday  2. LOCATION: "Where is the pain located?"     Right shoulder 3. PAIN: "How bad is the pain?" (Scale 1-10; or mild, moderate, severe)   - MILD (1-3): doesn't interfere with normal activities   - MODERATE (4-7): interferes with normal activities (e.g., work or school) or awakens from sleep   - SEVERE (8-10): excruciating pain, unable to do any normal activities, unable to move arm at all due to pain     Severe pain  4. WORK OR EXERCISE: "Has there been any recent work or exercise that involved this part of the body?"     Reached for something on Thursday and pain noted  5. CAUSE: "What do you think is causing the shoulder pain?"     na 6. OTHER SYMPTOMS: "Do you have any other symptoms?" (e.g., neck pain, swelling, rash, fever, numbness, weakness)     Swelling right shoulder. Severe pain ,  Robaxin 500 mg with some relief 7. PREGNANCY: "Is there any chance you are pregnant?" "When was your last menstrual period?"     na  Protocols used: Shoulder  Pain-A-AH

## 2023-08-16 ENCOUNTER — Other Ambulatory Visit: Payer: Self-pay | Admitting: Family Medicine

## 2023-08-16 ENCOUNTER — Ambulatory Visit (INDEPENDENT_AMBULATORY_CARE_PROVIDER_SITE_OTHER): Payer: BC Managed Care – PPO | Admitting: Family Medicine

## 2023-08-16 VITALS — BP 154/100 | HR 83 | Temp 97.1°F | Resp 16 | Ht 65.0 in | Wt 186.6 lb

## 2023-08-16 DIAGNOSIS — Z720 Tobacco use: Secondary | ICD-10-CM

## 2023-08-16 DIAGNOSIS — M25511 Pain in right shoulder: Secondary | ICD-10-CM | POA: Diagnosis not present

## 2023-08-16 MED ORDER — KETOROLAC TROMETHAMINE 60 MG/2ML IM SOLN
60.0000 mg | Freq: Once | INTRAMUSCULAR | Status: AC
Start: 2023-08-16 — End: 2023-08-16
  Administered 2023-08-16: 30 mg via INTRAMUSCULAR

## 2023-08-16 NOTE — Progress Notes (Signed)
Patient ID: Jamie Williams, female    DOB: 10-07-1961, 62 y.o.   MRN: [id number]  PCP: Malva Limes, MD  Chief Complaint  Patient presents with   Shoulder Pain    severe pain right shoulder after reaching for something on Thursday. Seen in UC and told to f/u for possible MRI. Now unable to lift arm. Pt currently taking Methocarbamol not helping   Edema    swelling right shoulder to elbow. Can move fingers but severe pain and pain level decreased with robaxin , now neck becoming stiff as well.    Subjective:   Jamie Williams is a 62 y.o. female, presents to clinic with CC of the following:  Shoulder Pain     Right shoulder and arm pain onset last week without injury, RHD female Went to UC over the weekend, they gave robaxin She has continued severe pain all over shoulder and decreased ROM, arm in sling, pain so severe she cannot sleep for the past 5 days No injury, no prior shoulder injury or known OA   Patient Active Problem List   Diagnosis Date Noted   Hypothyroid 07/22/2020   Chronic, continuous use of opioids 04/15/2020   Pap smear abnormality of cervix/human papillomavirus (HPV) positive 08/06/2019   Aortic atherosclerosis (HCC) 10/23/2018   GERD (gastroesophageal reflux disease)    Menopausal symptoms 01/10/2017   Cervical spondylosis with radiculopathy 12/24/2015   Compression injury of cervical spinal nerve 10/07/2015   Left arm pain 08/29/2015   Neck pain 08/29/2015   Hematuria 07/03/2015   Allergic arthritis 06/27/2015   Arthritis 06/27/2015   Back ache 06/27/2015   Clinical depression 06/27/2015   H/O abnormal cervical Papanicolaou smear 06/27/2015   Elevated intracranial pressure 06/27/2015   Kidney stones 06/27/2015   Knee pain 06/27/2015   Abnormal mammogram 06/27/2015   Headache, migraine 06/27/2015   Neuropathy 06/27/2015   Hand paresthesia 06/27/2015   Pre-diabetes 06/27/2015   Vitamin D deficiency 06/27/2015   Weight loss 06/27/2015    Adjustment reaction 06/27/2015   Hypercholesterolemia without hypertriglyceridemia 01/29/2010   Tobacco abuse 01/28/2010   Primary hypertension 09/16/2009   Insomnia 09/16/2009   Lesion of ulnar nerve 09/16/2009      Current Outpatient Medications:    albuterol (VENTOLIN HFA) 108 (90 Base) MCG/ACT inhaler, Inhale 2 puffs into the lungs every 6 (six) hours as needed for wheezing or shortness of breath., Disp: 18 g, Rfl: 5   amLODipine (NORVASC) 10 MG tablet, TAKE 1 TABLET(10 MG) BY MOUTH DAILY, Disp: 60 tablet, Rfl: 0   cefdinir (OMNICEF) 300 MG capsule, Take 1 capsule (300 mg total) by mouth 2 (two) times daily., Disp: 20 capsule, Rfl: 0   cholecalciferol (VITAMIN D) 1000 units tablet, Take 2,000 Units by mouth daily. , Disp: , Rfl:    erythromycin ophthalmic ointment, Place 1 Application into the left eye at bedtime., Disp: 3.5 g, Rfl: 0   estradiol (ESTRACE) 2 MG tablet, TAKE 1 TABLET(2 MG) BY MOUTH DAILY, Disp: 90 tablet, Rfl: 1   gabapentin (NEURONTIN) 300 MG capsule, TAKE 1 CAPSULE BY MOUTH THREE TIMES DAILY, Disp: 270 capsule, Rfl: 4   hydrochlorothiazide (HYDRODIURIL) 25 MG tablet, TAKE 1 TABLET(25 MG) BY MOUTH DAILY, Disp: 90 tablet, Rfl: 0   levothyroxine (SYNTHROID) 50 MCG tablet, TAKE 1 TABLET BY MOUTH EVERY DAY, Disp: 90 tablet, Rfl: 4   meloxicam (MOBIC) 15 MG tablet, TAKE 1 TABLET(15 MG) BY MOUTH DAILY WITH FOOD, Disp: 90 tablet, Rfl: 1  methocarbamol (ROBAXIN) 500 MG tablet, Take 500 mg by mouth 4 (four) times daily as needed., Disp: , Rfl:    nicotine (NICODERM CQ) 7 mg/24hr patch, Place 1 patch (7 mg total) onto the skin daily., Disp: 28 patch, Rfl: 1   PARoxetine (PAXIL) 30 MG tablet, TAKE 2 TABLETS(60 MG) BY MOUTH DAILY, Disp: 180 tablet, Rfl: 0   pravastatin (PRAVACHOL) 40 MG tablet, TAKE 1 TABLET(40 MG) BY MOUTH DAILY, Disp: 90 tablet, Rfl: 0   promethazine (PHENERGAN) 25 MG tablet, TAKE 1 TABLET(25 MG) BY MOUTH EVERY 6 HOURS AS NEEDED FOR NAUSEA OR VOMITING, Disp: 30  tablet, Rfl: 3   Allergies  Allergen Reactions   Aspirin Nausea And Vomiting   Penicillins Rash   Sulfa Antibiotics Hives     Social History   Tobacco Use   Smoking status: Every Day    Current packs/day: 0.50    Types: Cigarettes   Smokeless tobacco: Never   Tobacco comments:    Started about 62 years old usually 1/2 ppd  Vaping Use   Vaping status: Never Used  Substance Use Topics   Alcohol use: Not Currently    Alcohol/week: 0.0 standard drinks of alcohol   Drug use: Not Currently      Chart Review Today: I personally reviewed active problem list, medication list, allergies, family history, social history, health maintenance, notes from last encounter, lab results, imaging with the patient/caregiver today.   Review of Systems  Constitutional: Negative.   HENT: Negative.    Eyes: Negative.   Respiratory: Negative.    Cardiovascular: Negative.   Gastrointestinal: Negative.   Endocrine: Negative.   Genitourinary: Negative.   Musculoskeletal: Negative.   Skin: Negative.   Allergic/Immunologic: Negative.   Neurological: Negative.   Hematological: Negative.   Psychiatric/Behavioral: Negative.    All other systems reviewed and are negative.      Objective:   Vitals:   08/16/23 0838  BP: (!) 148/100  Pulse: 83  Resp: 16  Temp: (!) 97.1 F (36.2 C)  TempSrc: Temporal  SpO2: 97%  Weight: 186 lb 9.6 oz (84.6 kg)  Height: 5\' 5"  (1.651 m)    Body mass index is 31.05 kg/m.  Physical Exam Vitals and nursing note reviewed.  Constitutional:      Appearance: She is well-developed.  HENT:     Head: Normocephalic and atraumatic.     Nose: Nose normal.  Eyes:     General:        Right eye: No discharge.        Left eye: No discharge.     Conjunctiva/sclera: Conjunctivae normal.  Neck:     Trachea: No tracheal deviation.  Cardiovascular:     Rate and Rhythm: Normal rate and regular rhythm.  Pulmonary:     Effort: Pulmonary effort is normal. No  respiratory distress.     Breath sounds: No stridor.  Musculoskeletal:     Comments: Right shoulder in sling, limited ROM, generalized ttp to entire shoulder Normal right UE pulse, strength and sensation   Skin:    General: Skin is warm and dry.     Findings: No rash.  Neurological:     Mental Status: She is alert.     Motor: No abnormal muscle tone.     Coordination: Coordination normal.  Psychiatric:        Behavior: Behavior normal.      Results for orders placed or performed in visit on 12/17/22  POCT glycosylated hemoglobin (Hb A1C)  Result Value Ref Range   Hemoglobin A1C 6.0 (A) 4.0 - 5.6 %   Est. average glucose Bld gHb Est-mCnc 126        Assessment & Plan:   1. Acute pain of right shoulder Going to [facility] for walkin/UC appt  Tordal shot here Encouraged to continue NSAID and tylenol with muscle relaxers - Ambulatory referral to Orthopedic Surgery     Danelle Berry, PA-C 08/16/23 9:10 AM

## 2023-08-16 NOTE — Patient Instructions (Signed)
[facility] [address] [place], [place] [address] Phone: [phone] [email or web address]   Go today for an Urgent Care appointment

## 2023-08-18 NOTE — Telephone Encounter (Signed)
Requested Prescriptions  Pending Prescriptions Disp Refills   nicotine (NICODERM CQ - DOSED IN MG/24 HR) 7 mg/24hr patch [Pharmacy Med Name: NICOTINE TD 7MG /24H PATCH 14S] 28 patch 1    Sig: PLACE 1 PATCH ONTO THE SKIN DAILY     Psychiatry:  Drug Dependence Therapy Passed - 08/16/2023  4:58 PM      Passed - Valid encounter within last 12 months    Recent Outpatient Visits           2 days ago Acute pain of right shoulder   [facility] Danelle Berry, PA-C   2 months ago Eyelid cellulitis, left   [facility] [place], [place], PA-C   8 months ago Pre-diabetes   [facility] Malva Limes, MD   11 months ago Primary hypertension   [facility] Malva Limes, MD   1 year ago Benign essential HTN   [facility] Malva Limes, MD

## 2023-08-24 ENCOUNTER — Ambulatory Visit (INDEPENDENT_AMBULATORY_CARE_PROVIDER_SITE_OTHER): Payer: Medicare Other

## 2023-08-24 VITALS — Ht 65.0 in | Wt 186.0 lb

## 2023-08-24 DIAGNOSIS — Z0000 Encounter for general adult medical examination without abnormal findings: Secondary | ICD-10-CM | POA: Diagnosis not present

## 2023-08-24 NOTE — Patient Instructions (Addendum)
Ms. Tabler , Thank you for taking time to come for your Medicare Wellness Visit. I appreciate your ongoing commitment to your health goals. Please review the following plan we discussed and let me know if I can assist you in the future.   Referrals/Orders/Follow-Ups/Clinician Recommendations: Aim for 30 minutes of exercise or brisk walking, 6-8 glasses of water, and 5 servings of fruits and vegetables each day.  This is a list of the screening recommended for you and due dates:  Health Maintenance  Topic Date Due   DTaP/Tdap/Td vaccine (2 - Td or Tdap) 01/29/2020   Pap Smear  03/31/2023   Colon Cancer Screening  07/18/2023   COVID-19 Vaccine (3 - 2023-24 season) 09/01/2023*   Zoster (Shingles) Vaccine (1 of 2) 11/16/2023*   Flu Shot  01/23/2024*   Mammogram  06/22/2024   Medicare Annual Wellness Visit  08/23/2024   Hepatitis C Screening  Completed   HIV Screening  Completed   HPV Vaccine  Aged Out  *Topic was postponed. The date shown is not the original due date.    Advanced directives: (ACP Link)Information on Advanced Care Planning can be found at [facility] Advance Health Care Directives Advance Health Care Directives ([email or web address]   Next Medicare Annual Wellness Visit scheduled for next year: Yes

## 2023-08-24 NOTE — Progress Notes (Signed)
Subjective:   Jamie Williams is a 62 y.o. female who presents for Medicare Annual (Subsequent) preventive examination.  Visit Complete: Virtual I connected with  Vanna Scotland on 08/24/23 by a audio enabled telemedicine application and verified that I am speaking with the correct person using two identifiers.  Patient Location: Home  Provider Location: Home Office  I discussed the limitations of evaluation and management by telemedicine. The patient expressed understanding and agreed to proceed.  Vital Signs: Because this visit was a virtual/telehealth visit, some criteria may be missing or patient reported. Any vitals not documented were not able to be obtained and vitals that have been documented are patient reported.  Cardiac Risk Factors include: advanced age (>74men, >37 women);dyslipidemia;hypertension;smoking/ tobacco exposure     Objective:    Today's Vitals   08/24/23 1131  Weight: 186 lb (84.4 kg)  Height: 5\' 5"  (1.651 m)  PainSc: 6    Body mass index is 30.95 kg/m.     08/24/2023   11:54 AM 07/29/2022    3:22 PM 08/22/2021   12:52 AM 07/11/2020    5:32 PM 07/11/2020    4:52 PM 07/07/2020    8:54 AM 07/03/2019   10:44 AM  Advanced Directives  Does Patient Have a Medical Advance Directive? No No No  No No No  Would patient like information on creating a medical advance directive? Yes (MAU/Ambulatory/Procedural Areas - Information given) No - Patient declined  No - Patient declined   No - Patient declined    Current Medications (verified) Outpatient Encounter Medications as of 08/24/2023  Medication Sig   albuterol (VENTOLIN HFA) 108 (90 Base) MCG/ACT inhaler Inhale 2 puffs into the lungs every 6 (six) hours as needed for wheezing or shortness of breath.   amLODipine (NORVASC) 10 MG tablet TAKE 1 TABLET(10 MG) BY MOUTH DAILY   cholecalciferol (VITAMIN D) 1000 units tablet Take 2,000 Units by mouth daily.    erythromycin ophthalmic ointment Place 1 Application  into the left eye at bedtime.   estradiol (ESTRACE) 2 MG tablet TAKE 1 TABLET(2 MG) BY MOUTH DAILY   gabapentin (NEURONTIN) 300 MG capsule TAKE 1 CAPSULE BY MOUTH THREE TIMES DAILY   hydrochlorothiazide (HYDRODIURIL) 25 MG tablet TAKE 1 TABLET(25 MG) BY MOUTH DAILY   levothyroxine (SYNTHROID) 50 MCG tablet TAKE 1 TABLET BY MOUTH EVERY DAY   meloxicam (MOBIC) 15 MG tablet TAKE 1 TABLET(15 MG) BY MOUTH DAILY WITH FOOD   methocarbamol (ROBAXIN) 500 MG tablet Take 500 mg by mouth 4 (four) times daily as needed.   nicotine (NICODERM CQ - DOSED IN MG/24 HR) 7 mg/24hr patch PLACE 1 PATCH ONTO THE SKIN DAILY   PARoxetine (PAXIL) 30 MG tablet TAKE 2 TABLETS(60 MG) BY MOUTH DAILY   pravastatin (PRAVACHOL) 40 MG tablet TAKE 1 TABLET(40 MG) BY MOUTH DAILY   promethazine (PHENERGAN) 25 MG tablet TAKE 1 TABLET(25 MG) BY MOUTH EVERY 6 HOURS AS NEEDED FOR NAUSEA OR VOMITING   [DISCONTINUED] cefdinir (OMNICEF) 300 MG capsule Take 1 capsule (300 mg total) by mouth 2 (two) times daily.   No facility-administered encounter medications on file as of 08/24/2023.    Allergies (verified) Aspirin, Penicillins, and Sulfa antibiotics   History: Past Medical History:  Diagnosis Date   Anxiety    Family history of adverse reaction to anesthesia    "sister had a difficult time waking up from mastectomy and passed away"    GERD (gastroesophageal reflux disease)    History of chicken pox  History of kidney stones    PONV (postoperative nausea and vomiting)    nausea   Sciatic nerve pain    Past Surgical History:  Procedure Laterality Date   ANTERIOR CERVICAL DECOMP/DISCECTOMY FUSION N/A 12/24/2015   Procedure: Cervical five-six, Cervical six-seven  Anterior cervical decompression/diskectomy/fusion/interbody prosthesis/plate;  Surgeon: Tressie Stalker, MD;  Location: [facility] NEURO ORS;  Service: Neurosurgery;  Laterality: N/A;   COLONOSCOPY WITH PROPOFOL N/A 07/17/2018   Procedure: COLONOSCOPY WITH PROPOFOL;  Surgeon:  Wyline Mood, MD;  Location: [facility] ENDOSCOPY;  Service: Gastroenterology;  Laterality: N/A;   ECTOPIC PREGNANCY SURGERY  1991 and 1996   two Etopic preg.   Head Injuries  2006   surgery 2006 Cram/NS in [place]. Headaches with increased intracranial pressure. Repeat MRI in 2008 Negative   SUPRACERVICAL ABDOMINAL HYSTERECTOMY  2011   Abdominal; Ovaries intact; CERVIX INTACT. Fibroids/dys menorrhea. [place]   Ulnar Neuropathy Right 2004   Ulnar Neuropathy surgical revision. 2004-2008. [place].   Family History  Problem Relation Age of Onset   Hypertension Mother    Diabetes Mother        Type 2   Breast cancer Mother 15   Cancer Sister 65       Breast   Diabetes Sister        type 2   Breast cancer Sister 2   Breast cancer Sister    Diabetes Sister    Diabetes Sister    Diabetes Sister        type 2   Liver cancer Brother    Stomach cancer Brother    Social History   Socioeconomic History   Marital status: Married    Spouse name: Not on file   Number of children: 1   Years of education: Not on file   Highest education level: 12th grade  Occupational History   Occupation: disabled  Tobacco Use   Smoking status: Every Day    Current packs/day: 0.50    Types: Cigarettes   Smokeless tobacco: Never   Tobacco comments:    Started about 62 years old usually 1/2 ppd  Vaping Use   Vaping status: Never Used  Substance and Sexual Activity   Alcohol use: Not Currently    Alcohol/week: 0.0 standard drinks of alcohol   Drug use: Not Currently   Sexual activity: Yes    Birth control/protection: None  Other Topics Concern   Not on file  Social History Narrative   Not on file   Social Determinants of Health   Financial Resource Strain: Low Risk  (08/24/2023)   Overall Financial Resource Strain (CARDIA)    Difficulty of Paying Living Expenses: Not hard at all  Food Insecurity: No Food Insecurity (08/24/2023)   Hunger Vital Sign    Worried About Running Out of  Food in the Last Year: Never true    Ran Out of Food in the Last Year: Never true  Transportation Needs: No Transportation Needs (08/24/2023)   PRAPARE - Administrator, Civil Service (Medical): No    Lack of Transportation (Non-Medical): No  Physical Activity: Insufficiently Active (08/24/2023)   Exercise Vital Sign    Days of Exercise per Week: 1 day    Minutes of Exercise per Session: 10 min  Stress: Stress Concern Present (08/24/2023)   [employer] of Occupational Health - Occupational Stress Questionnaire    Feeling of Stress : To some extent  Social Connections: Moderately Isolated (08/24/2023)   Social Connection and Isolation Panel [NHANES]  Frequency of Communication with Friends and Family: Once a week    Frequency of Social Gatherings with Friends and Family: Once a week    Attends Religious Services: 1 to 4 times per year    Active Member of [employer] or Organizations: No    Attends Engineer, structural: Never    Marital Status: Married    Tobacco Counseling Ready to quit: Not Answered Counseling given: Not Answered Tobacco comments: Started about 62 years old usually 1/2 ppd   Clinical Intake:  Pre-visit preparation completed: Yes  Pain : 0-10 Pain Score: 6  Pain Type: Chronic pain Pain Location: Shoulder Pain Orientation: Right     Diabetes: No  How often do you need to have someone help you when you read instructions, pamphlets, or other written materials from your doctor or pharmacy?: 1 - Never  Interpreter Needed?: No  Information entered by :: Kandis Fantasia LPN   Activities of Daily Living    08/24/2023   11:32 AM 08/16/2023    8:38 AM  In your present state of health, do you have any difficulty performing the following activities:  Hearing? 0 0  Vision? 0 1  Difficulty concentrating or making decisions? 1 1  Walking or climbing stairs? 1 1  Dressing or bathing? 0 0  Doing errands, shopping? 0 0  Preparing Food  and eating ? N   Using the Toilet? N   In the past six months, have you accidently leaked urine? N   Do you have problems with loss of bowel control? N   Managing your Medications? N   Managing your Finances? N   Housekeeping or managing your Housekeeping? N     Patient Care Team: Malva Limes, MD as PCP - General (Family Medicine) Conard Novak, MD as Attending Physician (Obstetrics and Gynecology)  Indicate any recent Medical Services you may have received from other than [facility] providers in the past year (date may be approximate).     Assessment:   This is a routine wellness examination for [place].  Hearing/Vision screen Hearing Screening - Comments:: Denies hearing difficulties   Vision Screening - Comments:: No vision problems; will schedule routine eye exam soon     Goals Addressed   None   Depression Screen    08/24/2023   11:34 AM 08/16/2023    8:38 AM 06/08/2023   11:06 AM 07/29/2022    3:20 PM 04/30/2022   11:33 AM 12/28/2021    8:51 AM 07/03/2021    9:47 AM  PHQ 2/9 Scores  PHQ - 2 Score 2 2 1  0 0 0 0  PHQ- 9 Score 4 4 3  0 2  2    Fall Risk    08/24/2023   11:54 AM 08/16/2023    8:38 AM 06/08/2023   11:06 AM 07/29/2022    3:23 PM 07/26/2022    9:50 AM  Fall Risk   Falls in the past year? 0 0 0 0 0  Number falls in past yr: 0 0 0 0 0  Injury with Fall? 0 0 0 0 0  Risk for fall due to : No Fall Risks No Fall Risks No Fall Risks No Fall Risks   Follow up Falls prevention discussed;Education provided;Falls evaluation completed Falls prevention discussed;Education provided;Falls evaluation completed Falls evaluation completed Falls prevention discussed;Falls evaluation completed     MEDICARE RISK AT HOME: Medicare Risk at Home Any stairs in or around the home?: No If so, are there any without  handrails?: No Home free of loose throw rugs in walkways, pet beds, electrical cords, etc?: Yes Adequate lighting in your home to reduce risk of falls?: Yes Life  alert?: No Use of a cane, walker or w/c?: No Grab bars in the bathroom?: Yes Shower chair or bench in shower?: No Elevated toilet seat or a handicapped toilet?: Yes  TIMED UP AND GO:  Was the test performed?  No    Cognitive Function:        08/24/2023   11:54 AM 07/29/2022    3:26 PM 07/07/2020    9:04 AM 07/03/2019   10:51 AM 06/27/2018    3:31 PM  6CIT Screen  What Year? 0 points 0 points 0 points 0 points 0 points  What month? 0 points 0 points 0 points 0 points 0 points  What time? 0 points 0 points 0 points 0 points 0 points  Count back from 20 0 points 0 points 0 points 0 points 0 points  Months in reverse 0 points 0 points 0 points 0 points 2 points  Repeat phrase 0 points 0 points 0 points 2 points 0 points  Total Score 0 points 0 points 0 points 2 points 2 points    Immunizations Immunization History  Administered Date(s) Administered   Influenza,inj,Quad PF,6+ Mos 08/29/2015, 08/26/2016, 11/14/2017, 07/11/2018, 07/13/2019, 07/21/2020   PFIZER(Purple Top)SARS-COV-2 Vaccination 02/14/2020, 03/11/2020   Pneumococcal Polysaccharide-23 02/24/2011   Tdap 01/28/2010    TDAP status: Due, Education has been provided regarding the importance of this vaccine. Advised may receive this vaccine at local pharmacy or Health Dept. Aware to provide a copy of the vaccination record if obtained from local pharmacy or Health Dept. Verbalized acceptance and understanding.  Flu Vaccine status: Due, Education has been provided regarding the importance of this vaccine. Advised may receive this vaccine at local pharmacy or Health Dept. Aware to provide a copy of the vaccination record if obtained from local pharmacy or Health Dept. Verbalized acceptance and understanding.  Pneumococcal vaccine status: Up to date  Covid-19 vaccine status: Information provided on how to obtain vaccines.   Qualifies for Shingles Vaccine? Yes   Zostavax completed No   Shingrix Completed?: No.    Education has  been provided regarding the importance of this vaccine. Patient has been advised to call insurance company to determine out of pocket expense if they have not yet received this vaccine. Advised may also receive vaccine at local pharmacy or Health Dept. Verbalized acceptance and understanding.  Screening Tests Health Maintenance  Topic Date Due   DTaP/Tdap/Td (2 - Td or Tdap) 01/29/2020   Cervical Cancer Screening (Pap smear)  03/31/2023   Colonoscopy  07/18/2023   COVID-19 Vaccine (3 - 2023-24 season) 09/01/2023 (Originally 06/26/2023)   Zoster Vaccines- Shingrix (1 of 2) 11/16/2023 (Originally 11/11/2012)   INFLUENZA VACCINE  01/23/2024 (Originally 05/26/2023)   MAMMOGRAM  06/22/2024   Medicare Annual Wellness (AWV)  08/23/2024   Hepatitis C Screening  Completed   HIV Screening  Completed   HPV VACCINES  Aged Out    Health Maintenance  Health Maintenance Due  Topic Date Due   DTaP/Tdap/Td (2 - Td or Tdap) 01/29/2020   Cervical Cancer Screening (Pap smear)  03/31/2023   Colonoscopy  07/18/2023    Colorectal cancer screening: Type of screening: Colonoscopy. Completed 07/17/18. Repeat every 5 years (patient would like to hold on repeat at this time)   Mammogram status: Completed 06/23/23. Repeat every year  Lung Cancer Screening: (Low Dose CT  Chest recommended if Age 64-80 years, 20 pack-year currently smoking OR have quit w/in 15years.) does not qualify.   Lung Cancer Screening Referral: n/a  Additional Screening:  Hepatitis C Screening: does qualify; Completed 09/30/16  Vision Screening: Recommended annual ophthalmology exams for early detection of glaucoma and other disorders of the eye. Is the patient up to date with their annual eye exam?  No  Who is the provider or what is the name of the office in which the patient attends annual eye exams? none If pt is not established with a provider, would they like to be referred to a provider to establish care? No .   Dental Screening:  Recommended annual dental exams for proper oral hygiene  Community Resource Referral / Chronic Care Management: CRR required this visit?  No   CCM required this visit?  No     Plan:     I have personally reviewed and noted the following in the patient's chart:   Medical and social history Use of alcohol, tobacco or illicit drugs  Current medications and supplements including opioid prescriptions. Patient is not currently taking opioid prescriptions. Functional ability and status Nutritional status Physical activity Advanced directives List of other physicians Hospitalizations, surgeries, and ER visits in previous 12 months Vitals Screenings to include cognitive, depression, and falls Referrals and appointments  In addition, I have reviewed and discussed with patient certain preventive protocols, quality metrics, and best practice recommendations. A written personalized care plan for preventive services as well as general preventive health recommendations were provided to patient.     Kandis Fantasia [place], [place]   16/07/9603   After Visit Summary: (MyChart) Due to this being a telephonic visit, the after visit summary with patients personalized plan was offered to patient via MyChart   Nurse Notes: Patient states that she continues to have problems with right shoulder pain.  She was seen by [facility] with no findings from xrays.  She plans to contact them again but would like to know if there are any other options.

## 2023-09-20 ENCOUNTER — Other Ambulatory Visit: Payer: Self-pay | Admitting: Family Medicine

## 2023-09-20 DIAGNOSIS — I10 Essential (primary) hypertension: Secondary | ICD-10-CM

## 2023-09-21 NOTE — Telephone Encounter (Signed)
Requested Prescriptions  Pending Prescriptions Disp Refills   amLODipine (NORVASC) 10 MG tablet [Pharmacy Med Name: AMLODIPINE BESYLATE 10MG  TABLETS] 90 tablet 0    Sig: Take 1 tablet (10 mg total) by mouth daily. OFFICE VISIT NEEDED FOR ADDITIONAL REFILLS     Cardiovascular: Calcium Channel Blockers 2 Failed - 09/20/2023  5:31 PM      Failed - Last BP in normal range    BP Readings from Last 1 Encounters:  08/16/23 (!) 154/100         Passed - Last Heart Rate in normal range    Pulse Readings from Last 1 Encounters:  08/16/23 83         Passed - Valid encounter within last 6 months    Recent Outpatient Visits           1 month ago Acute pain of right shoulder   [facility] Danelle Berry, PA-C   3 months ago Eyelid cellulitis, left   [facility] [place], [place], PA-C   9 months ago Pre-diabetes   [facility] Malva Limes, MD   1 year ago Primary hypertension   [facility] [facility] Malva Limes, MD   1 year ago Benign essential HTN   [facility] Malva Limes, MD

## 2023-11-01 ENCOUNTER — Other Ambulatory Visit: Payer: Self-pay | Admitting: Family Medicine

## 2023-11-01 NOTE — Telephone Encounter (Signed)
 Requested medication (s) are due for refill today - unsure  Requested medication (s) are on the active medication list -yes  Future visit scheduled -no  Last refill: 06/10/23 #30 3RF  Notes to clinic: non delegated Rx  Requested Prescriptions  Pending Prescriptions Disp Refills   promethazine  (PHENERGAN ) 25 MG tablet [Pharmacy Med Name: PROMETHAZINE  25MG  TABLETS] 30 tablet 3    Sig: TAKE 1 TABLET(25 MG) BY MOUTH EVERY 6 HOURS AS NEEDED FOR NAUSEA OR VOMITING     Not Delegated - Gastroenterology: Antiemetics Failed - 11/01/2023  9:37 AM      Failed - This refill cannot be delegated      Passed - Valid encounter within last 6 months    Recent Outpatient Visits           2 months ago Acute pain of right shoulder   [facility] Leavy Mole, PA-C   4 months ago Eyelid cellulitis, left   [facility] [place], [place], PA-C   10 months ago Pre-diabetes   [facility] Gasper Nancyann BRAVO, MD   1 year ago Primary hypertension   [place] [facility] Gasper Nancyann BRAVO, MD   1 year ago Benign essential HTN   [facility] Gasper Nancyann BRAVO, MD                 Requested Prescriptions  Pending Prescriptions Disp Refills   promethazine  (PHENERGAN ) 25 MG tablet [Pharmacy Med Name: PROMETHAZINE  25MG  TABLETS] 30 tablet 3    Sig: TAKE 1 TABLET(25 MG) BY MOUTH EVERY 6 HOURS AS NEEDED FOR NAUSEA OR VOMITING     Not Delegated - Gastroenterology: Antiemetics Failed - 11/01/2023  9:37 AM      Failed - This refill cannot be delegated      Passed - Valid encounter within last 6 months    Recent Outpatient Visits           2 months ago Acute pain of right shoulder   [facility] Leavy Mole, PA-C   4 months ago Eyelid cellulitis, left   [facility] [place], [place], PA-C   10 months ago Pre-diabetes   [facility] Gas[place] Nancyann BRAVO, MD   1 year ago Primary hypertension   [facility] Gasper Nancyann BRAVO, MD   1 year ago Benign essential HTN   [facility] Gasper Nancyann BRAVO, MD

## 2023-11-28 ENCOUNTER — Telehealth: Payer: BC Managed Care – PPO | Admitting: Family Medicine

## 2023-11-28 ENCOUNTER — Encounter: Payer: Self-pay | Admitting: Family Medicine

## 2023-11-28 ENCOUNTER — Ambulatory Visit: Payer: Self-pay

## 2023-11-28 DIAGNOSIS — J069 Acute upper respiratory infection, unspecified: Secondary | ICD-10-CM

## 2023-11-28 MED ORDER — BENZONATATE 100 MG PO CAPS: 100.0000 mg | ORAL_CAPSULE | Freq: Two times a day (BID) | ORAL | 0 refills | Status: DC | PRN

## 2023-11-28 NOTE — Progress Notes (Signed)
MyChart Video Visit    Virtual Visit via Video Note   This format is felt to be most appropriate for this patient at this time. Physical exam was limited by quality of the video and audio technology used for the visit.    Patient location: home Provider location: [facility] Persons involved in the visit: patient, provider  I discussed the limitations of evaluation and management by telemedicine and the availability of in person appointments. The patient expressed understanding and agreed to proceed.  Patient: Jamie Williams   DOB: Mar 01, 1961   63 y.o. Female  MRN: [id number] Visit Date: 11/28/2023  Today's healthcare provider: Shirlee Latch, MD   No chief complaint on file.  Subjective    HPI   Discussed the use of AI scribe software for clinical note transcription with the patient, who gave verbal consent to proceed.  History of Present Illness   The patient, with a history of hypertension and taking meloxicam, presents with a constellation of symptoms including body aches, chills, sweating, hot flashes, loss of appetite, dry cough, and nasal congestion. She has been experiencing these symptoms since Saturday and initially thought it was a bug. She has been taking ibuprofen as advised by another healthcare provider but has not taken any other over-the-counter medications due to uncertainty about interactions with her current medication. She reports some mild difficulty breathing and has been taking Phenergan for nausea almost daily since the onset of her illness. She has not been exposed to anyone else who is sick and has not had a flu shot or recent COVID shot this year.       Review of Systems      Objective    There were no vitals taken for this visit.     Physical Exam Constitutional:      General: She is not in acute distress.    Appearance: Normal appearance.  HENT:     Head: Normocephalic.  Pulmonary:     Effort: Pulmonary effort is  normal. No respiratory distress.  Neurological:     Mental Status: She is alert and oriented to person, place, and time. Mental status is at baseline.        Assessment & Plan     Problem List Items Addressed This Visit   None Visit Diagnoses       Viral URI with cough    -  Primary           Viral Upper Respiratory Infection (URI) Presents with acute onset of body aches, chills, sweating, dry cough, and nasal congestion since Saturday. Differential includes COVID-19, influenza, and other respiratory viruses. Discussed typical course of viral infections (7-10 days), symptomatic treatment, and ineffectiveness of antibiotics. Explained treatment windows for COVID-19 (first 5 days) and influenza (first 48 hours). - Recommend home COVID-19 and influenza tests - Advise symptomatic treatment with plain Mucinex (avoid DM formulations) - Recommend acetaminophen or ibuprofen for body aches and fever (avoid ibuprofen with meloxicam) - Prescribe benzonatate for cough (send prescription to [facility] at [facility]) - Advise honey for cough relief - Instruct to follow up if symptoms persist beyond 7-10 days or worsen  General Health Maintenance Has not received the flu shot this year or a recent COVID-19 booster. Emphasized the importance of these vaccinations, especially during respiratory virus season. - Recommend flu shot and COVID-19 booster when feeling better  Follow-up - Follow up with the clinic if home COVID-19 or influenza test is positive - Schedule an in-person  visit if symptoms persist beyond 7-10 days.       Meds ordered this encounter  Medications   benzonatate (TESSALON) 100 MG capsule    Sig: Take 1 capsule (100 mg total) by mouth 2 (two) times daily as needed for cough.    Dispense:  20 capsule    Refill:  0     Return if symptoms worsen or fail to improve.     I discussed the assessment and treatment plan with the patient. The patient was provided an  opportunity to ask questions and all were answered. The patient agreed with the plan and demonstrated an understanding of the instructions.   The patient was advised to call back or seek an in-person evaluation if the symptoms worsen or if the condition fails to improve as anticipated.   Shirlee Latch, MD [facility] [phone] (phone) [phone] (fax)  [facility]

## 2023-11-28 NOTE — Telephone Encounter (Signed)
  Chief Complaint: body aches Symptoms: Fever, chills, headache, no appetite,  dry cough, runny nose, weakness, muscle aches- acetaminophen 0400 Frequency: Sat night Pertinent Negatives: Patient denies vomiting, nausea, dizziness Disposition: [] ED /[] Urgent Care (no appt availability in office) / [] Appointment(In office/virtual)/ [x]  [place] [facility]/ [] Home Care/ [] Refused Recommended Disposition /[] [place] [facility]/ []  Follow-up with PCP Additional Notes: Fever, chills, headache, no appetite,  dry cough, runny nose, weakness, muscle aches- acetaminophen 0400 Reason for Disposition . [1] MILD difficulty breathing (e.g., minimal/no SOB at rest, SOB with walking, pulse <100) AND [2] NEW-onset or WORSE than normal  Answer Assessment - Initial Assessment Questions 1. RESPIRATORY STATUS: "Describe your breathing?" (e.g., wheezing, shortness of breath, unable to speak, severe coughing)      Mild SOB with slight wheezing 2. ONSET: "When did this breathing problem begin?"      Sat night  4. SEVERITY: "How bad is your breathing?" (e.g., mild, moderate, severe)    - MILD: No SOB at rest, mild SOB with walking, speaks normally in sentences, can lie down, no retractions, pulse < 100.    - MODERATE: SOB at rest, SOB with minimal exertion and prefers to sit, cannot lie down flat, speaks in phrases, mild retractions, audible wheezing, pulse 100-120.    - SEVERE: Very SOB at rest, speaks in single words, struggling to breathe, sitting hunched forward, retractions, pulse > 120      moderate 6. CARDIAC HISTORY: "Do you have any history of heart disease?" (e.g., heart attack, angina, bypass surgery, angioplasty)      no 7. LUNG HISTORY: "Do you have any history of lung disease?"  (e.g., pulmonary embolus, asthma, emphysema)     mo  9. OTHER SYMPTOMS: "Do you have any other symptoms? (e.g., dizziness, runny nose, cough, chest pain, fever)     Fever, chills, headache, no appetite,  dry cough,  runny nose, weakness, muscle aches- acetaminophen 0400  Protocols used: Breathing Difficulty-A-AH

## 2023-11-29 ENCOUNTER — Ambulatory Visit: Payer: Self-pay

## 2023-11-29 NOTE — Telephone Encounter (Signed)
 Pt called back with covid results per Dr. KATHEE and she has tested negative with at home test. Wants to know what the next steps are, please advise   Best contact: [phone]    Chief Complaint: States PCP wanted to know home COVID test results - negative. Symptoms: n/a Frequency: n/a Pertinent Negatives: Patient denies  Disposition: [] ED /[] Urgent Care (no appt availability in office) / [] Appointment(In office/virtual)/ []  [place] [facility]/ [] Home Care/ [] Refused Recommended Disposition /[] [place] [facility]/ [x]  Follow-up with PCP Additional Notes: Please advise pt.  Reason for Disposition  [1] Follow-up call to recent contact AND [2] information only call, no triage required  Answer Assessment - Initial Assessment Questions 1. REASON FOR CALL or QUESTION: What is your reason for calling today? or How can I best help you? or What question do you have that I can help answer?     Covid test at home - negative  Protocols used: Information Only Call - No Triage-A-AH

## 2023-11-30 NOTE — Telephone Encounter (Signed)
Pt husband advised. Verbalized understanding

## 2023-11-30 NOTE — Telephone Encounter (Signed)
 I didn't see her, so not sure if there was additional follow up plan. Sounds like she thought she had a viral respiratory infection and should be improving with in a few day. Should call if sx worsen or not cleared up by the end of the week.

## 2024-01-19 ENCOUNTER — Ambulatory Visit: Payer: Self-pay

## 2024-01-19 NOTE — Telephone Encounter (Signed)
 Chief Complaint: Back Pain/shoulder pain Symptoms: pain to left side of back into hip, right shoulder pain Frequency: a week ago Pertinent Negatives: Patient denies fever Disposition: [] ED /[] Urgent Care (no appt availability in office) / [x] Appointment(In office/virtual)/ []  [place] [facility]/ [] Home Care/ [] Refused Recommended Disposition /[] [place] [facility]/ []  Follow-up with PCP Additional Notes: patient called with concerns for left sided back pain and right shoulder pain that has been going on for a week. Patient states both areas having been having pain on and off for awhile. Patient endorses that her gabapentin nor ibuprofen is helping with the pain. Per protocol, patient to be seen for an appointment. Appointment made for 01/20/2024 at 10:40 AM with PCP. Patient verbalized understanding of plan and all questions answered.    Copied from CRM [id number]. Topic: Clinical - Red Word Triage >> Jan 19, 2024  2:06 PM Geroge Baseman wrote: Red Word that prompted transfer to Nurse Triage: Pain left side of back and hip, right side shoulder, meds not working Reason for Disposition  [1] MODERATE pain (e.g., interferes with normal activities) AND [2] present > 3 days  [1] Numbness in an arm or hand (i.e., loss of sensation) AND [2] upper back pain  Answer Assessment - Initial Assessment Questions 1. ONSET: "When did the pain begin?"      Started a week ago 2. LOCATION: "Where does it hurt?" (upper, mid or lower back)     Lower left back going into left hip 3. SEVERITY: "How bad is the pain?"  (e.g., Scale 1-10; mild, moderate, or severe)   - MILD (1-3): Doesn't interfere with normal activities.    - MODERATE (4-7): Interferes with normal activities or awakens from sleep.    - SEVERE (8-10): Excruciating pain, unable to do any normal activities.      10 4. PATTERN: "Is the pain constant?" (e.g., yes, no; constant, intermittent)      constant 5. RADIATION: "Does the pain shoot into your  legs or somewhere else?"     Radiates down into left leg 6. CAUSE:  "What do you think is causing the back pain?"      unsure 7. BACK OVERUSE:  "Any recent lifting of heavy objects, strenuous work or exercise?"     no 8. MEDICINES: "What have you taken so far for the pain?" (e.g., nothing, acetaminophen, NSAIDS)     Gabapentin 300 TID 9. NEUROLOGIC SYMPTOMS: "Do you have any weakness, numbness, or problems with bowel/bladder control?"     No 10. OTHER SYMPTOMS: "Do you have any other symptoms?" (e.g., fever, abdomen pain, burning with urination, blood in urine)       Right shoulder pain  Answer Assessment - Initial Assessment Questions 1. ONSET: "When did the pain start?"     Pain started back in December 2. LOCATION: "Where is the pain located?"     Right shoulder 3. PAIN: "How bad is the pain?" (Scale 1-10; or mild, moderate, severe)   - MILD (1-3): doesn't interfere with normal activities   - MODERATE (4-7): interferes with normal activities (e.g., work or school) or awakens from sleep   - SEVERE (8-10): excruciating pain, unable to do any normal activities, unable to move arm at all due to pain     8 out of 10 4. WORK OR EXERCISE: "Has there been any recent work or exercise that involved this part of the body?"     no 5. CAUSE: "What do you think is causing the shoulder pain?"  unsure 6. OTHER SYMPTOMS: "Do you have any other symptoms?" (e.g., neck pain, swelling, rash, fever, numbness, weakness)     Patient reports that when she lays on her right side-arm goes numb.  Protocols used: Back Pain-A-AH, Shoulder Pain-A-AH

## 2024-01-20 ENCOUNTER — Encounter: Payer: Self-pay | Admitting: Family Medicine

## 2024-01-20 ENCOUNTER — Ambulatory Visit
Admission: RE | Admit: 2024-01-20 | Discharge: 2024-01-20 | Disposition: A | Discharge status: Home / Self Care | Source: Ambulatory Visit | Attending: Family Medicine | Admitting: Family Medicine

## 2024-01-20 ENCOUNTER — Ambulatory Visit (INDEPENDENT_AMBULATORY_CARE_PROVIDER_SITE_OTHER): Admitting: Family Medicine

## 2024-01-20 VITALS — BP 162/89 | HR 76 | Ht 65.0 in | Wt 183.0 lb

## 2024-01-20 DIAGNOSIS — M25562 Pain in left knee: Secondary | ICD-10-CM

## 2024-01-20 MED ORDER — COLCHICINE 0.6 MG PO TABS: ORAL_TABLET | ORAL | 0 refills | Status: AC

## 2024-01-20 NOTE — Progress Notes (Signed)
 Established patient visit   Patient: Jamie Williams   DOB: 1961-01-04   63 y.o. Female  MRN: [id number] Visit Date: 01/20/2024  Today's healthcare provider: Mila Merry, MD   Chief Complaint  Patient presents with   Joint Swelling    Left knee, 10 on pain scale, abnormal walk, starting yesterday    Tailbone Pain    Left side pain radiating down left leg, 10 on pain scale, starting 4 months ago   Subjective     Discussed the use of AI scribe software for clinical note transcription with the patient, who gave verbal consent to proceed.  History of Present Illness   Jamie Williams is a 63 year old female who presents with left knee pain and swelling.  She has been experiencing left knee pain and swelling since yesterday, which limits her ability to bend the knee. She denies any recent injury to the knee. She has been taking gabapentin and ibuprofen, but these medications have not alleviated the knee pain. No redness in the affected leg. No recent fevers, chills, or sweats aside from hot flashes.  She reports chronic left-sided back pain that has been more severe over the past month, with episodes occurring intermittently for the past four to five months. The pain radiates from her back down the side of her leg, sparing the calf. No recent injury has been reported.  She experiences right shoulder pain that has persisted for about six months. Gabapentin and meloxicam were initially effective in managing the shoulder pain, but they are no longer providing relief.  She mentions experiencing hot flashes but denies having any fevers, chills, or sweats aside from these.      Medications: Outpatient Medications Prior to Visit  Medication Sig   albuterol (VENTOLIN HFA) 108 (90 Base) MCG/ACT inhaler Inhale 2 puffs into the lungs every 6 (six) hours as needed for wheezing or shortness of breath.   amLODipine (NORVASC) 10 MG tablet Take 1 tablet (10 mg total) by mouth daily. OFFICE  VISIT NEEDED FOR ADDITIONAL REFILLS   cholecalciferol (VITAMIN D) 1000 units tablet Take 2,000 Units by mouth daily.    estradiol (ESTRACE) 2 MG tablet TAKE 1 TABLET(2 MG) BY MOUTH DAILY   gabapentin (NEURONTIN) 300 MG capsule TAKE 1 CAPSULE BY MOUTH THREE TIMES DAILY   hydrochlorothiazide (HYDRODIURIL) 25 MG tablet TAKE 1 TABLET(25 MG) BY MOUTH DAILY   levothyroxine (SYNTHROID) 50 MCG tablet TAKE 1 TABLET BY MOUTH EVERY DAY   meloxicam (MOBIC) 15 MG tablet TAKE 1 TABLET(15 MG) BY MOUTH DAILY WITH FOOD   nicotine (NICODERM CQ - DOSED IN MG/24 HR) 7 mg/24hr patch PLACE 1 PATCH ONTO THE SKIN DAILY   PARoxetine (PAXIL) 30 MG tablet TAKE 2 TABLETS(60 MG) BY MOUTH DAILY   pravastatin (PRAVACHOL) 40 MG tablet TAKE 1 TABLET(40 MG) BY MOUTH DAILY   promethazine (PHENERGAN) 25 MG tablet TAKE 1 TABLET(25 MG) BY MOUTH EVERY 6 HOURS AS NEEDED FOR NAUSEA OR VOMITING   benzonatate (TESSALON) 100 MG capsule Take 1 capsule (100 mg total) by mouth 2 (two) times daily as needed for cough. (Patient not taking: Reported on 01/20/2024)   erythromycin ophthalmic ointment Place 1 Application into the left eye at bedtime. (Patient not taking: Reported on 01/20/2024)   methocarbamol (ROBAXIN) 500 MG tablet Take 500 mg by mouth 4 (four) times daily as needed. (Patient not taking: Reported on 01/20/2024)   No facility-administered medications prior to visit.    Review of Systems  Objective    BP (!) 162/89   Pulse 76   Ht 5\' 5"  (1.651 m)   Wt 183 lb (83 kg)   SpO2 98%   BMI 30.45 kg/m     Physical Exam   MUSCULOSKELETAL: Left knee swollen with limited range of motion. but no erythema       Assessment & Plan        Left knee pain and swelling Acute left knee pain and swelling without injury. Differential includes gout and infection. Gabapentin and ibuprofen ineffective. - Order x-ray of the left knee. - Order CBC & uric acid level. - Prescribe colchicine.  Left-sided back pain Chronic left-sided  back pain with radiation down the leg, worsening over the last month.  Right shoulder pain Chronic right shoulder pain for six months with partial relief from gabapentin and meloxicam.            Mila Merry, MD  [facility] [phone] (phone) [phone] (fax)  [facility]

## 2024-01-20 NOTE — Patient Instructions (Signed)
 Please review the attached list of medications and notify my office if there are any errors.   Go to [facility]) Imaging at [facility] for your Xrays (phone no. [phone])

## 2024-01-21 LAB — CBC WITH DIFFERENTIAL/PLATELET
Basophils Absolute: 0.1 10*3/uL (ref 0.0–0.2)
Basos: 0 %
EOS (ABSOLUTE): 0.1 10*3/uL (ref 0.0–0.4)
Eos: 1 %
Hematocrit: 43.9 % (ref 34.0–46.6)
Hemoglobin: 14.3 g/dL (ref 11.1–15.9)
Immature Grans (Abs): 0 10*3/uL (ref 0.0–0.1)
Immature Granulocytes: 0 %
Lymphocytes Absolute: 3.9 10*3/uL — ABNORMAL HIGH (ref 0.7–3.1)
Lymphs: 29 %
MCH: 28.7 pg (ref 26.6–33.0)
MCHC: 32.6 g/dL (ref 31.5–35.7)
MCV: 88 fL (ref 79–97)
Monocytes Absolute: 1 10*3/uL — ABNORMAL HIGH (ref 0.1–0.9)
Monocytes: 8 %
Neutrophils Absolute: 8.3 10*3/uL — ABNORMAL HIGH (ref 1.4–7.0)
Neutrophils: 62 %
Platelets: 335 10*3/uL (ref 150–450)
RBC: 4.99 x10E6/uL (ref 3.77–5.28)
RDW: 13 % (ref 11.7–15.4)
WBC: 13.4 10*3/uL — ABNORMAL HIGH (ref 3.4–10.8)

## 2024-01-21 LAB — URIC ACID: Uric Acid: 7.1 mg/dL (ref 3.0–7.2)

## 2024-02-06 ENCOUNTER — Telehealth: Payer: Self-pay | Admitting: Physician Assistant

## 2024-02-06 ENCOUNTER — Ambulatory Visit (INDEPENDENT_AMBULATORY_CARE_PROVIDER_SITE_OTHER): Payer: Self-pay | Admitting: Physician Assistant

## 2024-02-06 ENCOUNTER — Encounter: Payer: Self-pay | Admitting: Physician Assistant

## 2024-02-06 VITALS — BP 184/93 | HR 76 | Temp 97.9°F | Resp 16 | Ht 65.0 in | Wt 182.0 lb

## 2024-02-06 DIAGNOSIS — E039 Hypothyroidism, unspecified: Secondary | ICD-10-CM

## 2024-02-06 DIAGNOSIS — I10 Essential (primary) hypertension: Secondary | ICD-10-CM | POA: Diagnosis not present

## 2024-02-06 DIAGNOSIS — M5442 Lumbago with sciatica, left side: Secondary | ICD-10-CM | POA: Diagnosis not present

## 2024-02-06 DIAGNOSIS — R7303 Prediabetes: Secondary | ICD-10-CM

## 2024-02-06 DIAGNOSIS — E782 Mixed hyperlipidemia: Secondary | ICD-10-CM | POA: Diagnosis not present

## 2024-02-06 DIAGNOSIS — E538 Deficiency of other specified B group vitamins: Secondary | ICD-10-CM

## 2024-02-06 DIAGNOSIS — R5383 Other fatigue: Secondary | ICD-10-CM

## 2024-02-06 DIAGNOSIS — E559 Vitamin D deficiency, unspecified: Secondary | ICD-10-CM

## 2024-02-06 MED ORDER — CYCLOBENZAPRINE HCL 10 MG PO TABS: 10.0000 mg | ORAL_TABLET | Freq: Three times a day (TID) | ORAL | 1 refills | Status: DC | PRN

## 2024-02-06 MED ORDER — LISINOPRIL 5 MG PO TABS: 5.0000 mg | ORAL_TABLET | Freq: Every day | ORAL | 3 refills | Status: DC

## 2024-02-06 NOTE — Progress Notes (Unsigned)
 [facility] [address] [place], [place] [address]  Internal MEDICINE  Office Visit Note  Patient Name: Jamie Williams  [id number]  [id number]  Date of Service: 02/07/2024   Complaints/HPI Pt is here for establishment of PCP. Chief Complaint  Patient presents with   New Patient (Initial Visit)   Gastroesophageal Reflux   Quality Metric Gaps    TDAP, Shingles and Pneumonia vaccines + Colonoscopy   Sciatica   Shoulder Pain    Right shoulder   HPI Pt is here to establish care with acute concern today -Pt reports having left side low back pain for the past month, taking meloxicam and gabapentin. Radiates down left leg. States her last PCP did xray on left knee, but not on back. No injury she is aware of. Does have hx of sciatica, but normally doesn't last like this. States she was having some swelling at knee at time of xray and she was given a medication for possible gout, but states knee swelling has resolved. Reports chronic knee problems still though.  -pt actually a little nauseous from pain after exam -Discussed trial of muscle relaxer, but will send urgent ortho referral for xrays and further treatment course. Advised of [facility] walk in option as well -Hx of HTN, HLD -BP at home usually controlled. She is in pain coming into office today and likely driving up further. Recently a little elevated with pain in 140s systolic. -no fevers or chills, no skin changes -will check labs  Current Medication: Outpatient Encounter Medications as of 02/06/2024  Medication Sig   albuterol (VENTOLIN HFA) 108 (90 Base) MCG/ACT inhaler Inhale 2 puffs into the lungs every 6 (six) hours as needed for wheezing or shortness of breath.   amLODipine (NORVASC) 10 MG tablet Take 1 tablet (10 mg total) by mouth daily. OFFICE VISIT NEEDED FOR ADDITIONAL REFILLS   cholecalciferol (VITAMIN D) 1000 units tablet Take 2,000 Units by mouth daily.    colchicine 0.6 MG tablet Take 2 tablets  (1.2 mg) on first day of gout flare, then 1 tablet daily   cyclobenzaprine (FLEXERIL) 10 MG tablet Take 1 tablet (10 mg total) by mouth 3 (three) times daily as needed for muscle spasms. Take one tab po qhs for back spasm prn only   erythromycin ophthalmic ointment Place 1 Application into the left eye at bedtime.   estradiol (ESTRACE) 2 MG tablet TAKE 1 TABLET(2 MG) BY MOUTH DAILY   gabapentin (NEURONTIN) 300 MG capsule TAKE 1 CAPSULE BY MOUTH THREE TIMES DAILY   hydrochlorothiazide (HYDRODIURIL) 25 MG tablet TAKE 1 TABLET(25 MG) BY MOUTH DAILY   levothyroxine (SYNTHROID) 50 MCG tablet TAKE 1 TABLET BY MOUTH EVERY DAY   lisinopril (ZESTRIL) 5 MG tablet Take 1 tablet (5 mg total) by mouth daily.   meloxicam (MOBIC) 15 MG tablet TAKE 1 TABLET(15 MG) BY MOUTH DAILY WITH FOOD   nicotine (NICODERM CQ - DOSED IN MG/24 HR) 7 mg/24hr patch PLACE 1 PATCH ONTO THE SKIN DAILY   PARoxetine (PAXIL) 30 MG tablet TAKE 2 TABLETS(60 MG) BY MOUTH DAILY   pravastatin (PRAVACHOL) 40 MG tablet TAKE 1 TABLET(40 MG) BY MOUTH DAILY   promethazine (PHENERGAN) 25 MG tablet TAKE 1 TABLET(25 MG) BY MOUTH EVERY 6 HOURS AS NEEDED FOR NAUSEA OR VOMITING   [DISCONTINUED] benzonatate (TESSALON) 100 MG capsule Take 1 capsule (100 mg total) by mouth 2 (two) times daily as needed for cough.   [DISCONTINUED] methocarbamol (ROBAXIN) 500 MG tablet Take 500 mg by mouth  4 (four) times daily as needed.   No facility-administered encounter medications on file as of 02/06/2024.    Surgical History: Past Surgical History:  Procedure Laterality Date   ANTERIOR CERVICAL DECOMP/DISCECTOMY FUSION N/A 12/24/2015   Procedure: Cervical five-six, Cervical six-seven  Anterior cervical decompression/diskectomy/fusion/interbody prosthesis/plate;  Surgeon: Tressie Stalker, MD;  Location: [facility];  Service: Neurosurgery;  Laterality: N/A;   COLONOSCOPY WITH PROPOFOL N/A 07/17/2018   Procedure: COLONOSCOPY WITH PROPOFOL;  Surgeon: Wyline Mood, MD;   Location: [facility] ENDOSCOPY;  Service: Gastroenterology;  Laterality: N/A;   ECTOPIC PREGNANCY SURGERY  1991 and 1996   two Etopic preg.   Head Injuries  2006   surgery 2006 Cram/NS in [place]. Headaches with increased intracranial pressure. Repeat MRI in 2008 Negative   SUPRACERVICAL ABDOMINAL HYSTERECTOMY  2011   Abdominal; Ovaries intact; CERVIX INTACT. Fibroids/dys menorrhea. Weaver-Lee   Ulnar Neuropathy Right 2004   Ulnar Neuropathy surgical revision. 2004-2008. [place].    Medical History: Past Medical History:  Diagnosis Date   Anxiety    Family history of adverse reaction to anesthesia    "sister had a difficult time waking up from mastectomy and passed away"    GERD (gastroesophageal reflux disease)    History of chicken pox    History of kidney stones    Hyperlipidemia    Hypertension    PONV (postoperative nausea and vomiting)    nausea   Sciatic nerve pain     Family History: Family History  Problem Relation Age of Onset   Hypertension Mother    Diabetes Mother        Type 2   Breast cancer Mother 50   Cancer Sister 12       Breast   Diabetes Sister        type 2   Breast cancer Sister 11   Breast cancer Sister    Diabetes Sister    Diabetes Sister    Diabetes Sister        type 2   Liver cancer Brother    Stomach cancer Brother     Social History   Socioeconomic History   Marital status: Married    Spouse name: Not on file   Number of children: 1   Years of education: Not on file   Highest education level: 12th grade  Occupational History   Occupation: disabled  Tobacco Use   Smoking status: Every Day    Current packs/day: 0.50    Types: Cigarettes   Smokeless tobacco: Never   Tobacco comments:    5 cigarettes daily.  Vaping Use   Vaping status: Never Used  Substance and Sexual Activity   Alcohol use: Not Currently    Alcohol/week: 0.0 standard drinks of alcohol   Drug use: Not Currently   Sexual activity: Yes    Birth  control/protection: None  Other Topics Concern   Not on file  Social History Narrative   Not on file   Social Drivers of Health   Financial Resource Strain: Low Risk  (08/24/2023)   Overall Financial Resource Strain (CARDIA)    Difficulty of Paying Living Expenses: Not hard at all  Food Insecurity: No Food Insecurity (08/24/2023)   Hunger Vital Sign    Worried About Running Out of Food in the Last Year: Never true    Ran Out of Food in the Last Year: Never true  Transportation Needs: No Transportation Needs (08/24/2023)   PRAPARE - Transportation    Lack of Transportation (  Medical): No    Lack of Transportation (Non-Medical): No  Physical Activity: Insufficiently Active (08/24/2023)   Exercise Vital Sign    Days of Exercise per Week: 1 day    Minutes of Exercise per Session: 10 min  Stress: Stress Concern Present (08/24/2023)   [employer] of Occupational Health - Occupational Stress Questionnaire    Feeling of Stress : To some extent  Social Connections: Moderately Isolated (08/24/2023)   Social Connection and Isolation Panel [NHANES]    Frequency of Communication with Friends and Family: Once a week    Frequency of Social Gatherings with Friends and Family: Once a week    Attends Religious Services: 1 to 4 times per year    Active Member of [employer] or Organizations: No    Attends Banker Meetings: Never    Marital Status: Married  Catering manager Violence: Not At Risk (08/24/2023)   Humiliation, Afraid, Rape, and Kick questionnaire    Fear of Current or Ex-Partner: No    Emotionally Abused: No    Physically Abused: No    Sexually Abused: No     Review of Systems  Constitutional:  Negative for chills, fatigue and unexpected weight change.  HENT:  Positive for postnasal drip. Negative for congestion, rhinorrhea, sneezing and sore throat.   Eyes:  Negative for redness.  Respiratory:  Negative for cough, chest tightness and shortness of breath.    Cardiovascular:  Negative for chest pain and palpitations.  Gastrointestinal:  Positive for nausea. Negative for vomiting.  Genitourinary:  Negative for dysuria and frequency.  Musculoskeletal:  Positive for arthralgias, back pain and gait problem. Negative for joint swelling and neck pain.  Skin:  Negative for rash.  Neurological:  Negative for tremors.  Hematological:  Negative for adenopathy. Does not bruise/bleed easily.  Psychiatric/Behavioral:  Positive for sleep disturbance. Negative for behavioral problems (Depression) and suicidal ideas. The patient is not nervous/anxious.     Vital Signs: BP (!) 184/93   Pulse 76   Temp 97.9 F (36.6 C)   Resp 16   Ht 5\' 5"  (1.651 m)   Wt 182 lb (82.6 kg)   SpO2 100%   BMI 30.29 kg/m    Physical Exam Vitals and nursing note reviewed.  Constitutional:      General: She is not in acute distress.    Appearance: She is well-developed. She is not diaphoretic.  HENT:     Head: Normocephalic and atraumatic.  Neck:     Thyroid: No thyromegaly.     Vascular: No JVD.     Trachea: No tracheal deviation.  Cardiovascular:     Rate and Rhythm: Normal rate and regular rhythm.     Heart sounds: Normal heart sounds. No murmur heard.    No friction rub. No gallop.  Pulmonary:     Effort: Pulmonary effort is normal. No respiratory distress.     Breath sounds: No wheezing or rales.  Chest:     Chest wall: No tenderness.  Musculoskeletal:        General: Tenderness present. Normal range of motion.     Right lower leg: No edema.     Left lower leg: No edema.     Comments: Tenderness to palpation over left low back, pt unable to tolerate laying flat for further exam  Skin:    General: Skin is warm and dry.  Neurological:     Mental Status: She is alert and oriented to person, place, and time.  Gait: Gait abnormal.  Psychiatric:        Behavior: Behavior normal.        Thought Content: Thought content normal.        Judgment: Judgment  normal.       Assessment/Plan: 1. Acute left-sided low back pain with left-sided sciatica (Primary) Already taking gabapentin and meloxicam, will trial flexeril prn. Cautioned on S/E including increased grogginess, especially in combination with gabapentin. Will also place urgent ortho referral. Advised on [facility] walk-in option as well - cyclobenzaprine (FLEXERIL) 10 MG tablet; Take 1 tablet (10 mg total) by mouth 3 (three) times daily as needed for muscle spasms. Take one tab po qhs for back spasm prn only  Dispense: 30 tablet; Refill: 1 - AMB referral to orthopedics  2. Primary hypertension Very elevated in office, but reports better numbers at home and likely secondary to pain. She will monitor closely. Will add Lisinopril 5mg  daily, may be able to d/c once pain better controlled. Advised to call if any concerns - lisinopril (ZESTRIL) 5 MG tablet; Take 1 tablet (5 mg total) by mouth daily.  Dispense: 90 tablet; Refill: 3  3. Pre-diabetes - Hgb A1C w/o eAG  4. Mixed hyperlipidemia - Lipid Panel With LDL/HDL Ratio  5. Hypothyroidism, unspecified type - TSH + free T4  6. Vitamin D deficiency - VITAMIN D 25 Hydroxy (Vit-D Deficiency, Fractures)  7. B12 deficiency - B12 and Folate Panel  8. Other fatigue - CBC w/Diff/Platelet - Comprehensive metabolic panel with GFR - TSH + free T4 - Lipid Panel With LDL/HDL Ratio - Hgb A1C w/o eAG   General Counseling: Keauna verbalizes understanding of the findings of todays visit and agrees with plan of treatment. I have discussed any further diagnostic evaluation that may be needed or ordered today. We also reviewed her medications today. she has been encouraged to call the office with any questions or concerns that should arise related to todays visit.    Counseling:    Orders Placed This Encounter  Procedures   CBC w/Diff/Platelet   Comprehensive metabolic panel with GFR   TSH + free T4   Lipid Panel With LDL/HDL Ratio    Hgb A1C w/o eAG   B12 and Folate Panel   VITAMIN D 25 Hydroxy (Vit-D Deficiency, Fractures)   AMB referral to orthopedics    Meds ordered this encounter  Medications   cyclobenzaprine (FLEXERIL) 10 MG tablet    Sig: Take 1 tablet (10 mg total) by mouth 3 (three) times daily as needed for muscle spasms. Take one tab po qhs for back spasm prn only    Dispense:  30 tablet    Refill:  1   lisinopril (ZESTRIL) 5 MG tablet    Sig: Take 1 tablet (5 mg total) by mouth daily.    Dispense:  90 tablet    Refill:  3     This patient was seen by Taylor Favia, PA-C in collaboration with Dr. Verneta Gone as a part of collaborative care agreement.   Time spent:35 Minutes

## 2024-02-06 NOTE — Telephone Encounter (Signed)
 Awaiting 02/06/24 office notes for Orthopedic referral-[facility]

## 2024-02-07 ENCOUNTER — Telehealth: Payer: Self-pay | Admitting: Physician Assistant

## 2024-02-07 DIAGNOSIS — M47896 Other spondylosis, lumbar region: Secondary | ICD-10-CM | POA: Diagnosis not present

## 2024-02-07 DIAGNOSIS — M545 Low back pain, unspecified: Secondary | ICD-10-CM | POA: Diagnosis not present

## 2024-02-07 DIAGNOSIS — M533 Sacrococcygeal disorders, not elsewhere classified: Secondary | ICD-10-CM | POA: Diagnosis not present

## 2024-02-07 DIAGNOSIS — M47816 Spondylosis without myelopathy or radiculopathy, lumbar region: Secondary | ICD-10-CM | POA: Diagnosis not present

## 2024-02-07 NOTE — Telephone Encounter (Signed)
 Urgent Orthopedic referral sent via [facility] to [facility]  Lvm notifiying patient. Gave pt telephone# [phone]

## 2024-02-08 ENCOUNTER — Telehealth: Payer: Self-pay | Admitting: Physician Assistant

## 2024-02-08 NOTE — Telephone Encounter (Signed)
 Orthopedic appointment 02/07/2024 @ [facility]

## 2024-02-09 DIAGNOSIS — M545 Low back pain, unspecified: Secondary | ICD-10-CM | POA: Diagnosis not present

## 2024-02-14 DIAGNOSIS — M545 Low back pain, unspecified: Secondary | ICD-10-CM | POA: Diagnosis not present

## 2024-02-23 DIAGNOSIS — E782 Mixed hyperlipidemia: Secondary | ICD-10-CM | POA: Diagnosis not present

## 2024-02-23 DIAGNOSIS — E039 Hypothyroidism, unspecified: Secondary | ICD-10-CM | POA: Diagnosis not present

## 2024-02-23 DIAGNOSIS — E559 Vitamin D deficiency, unspecified: Secondary | ICD-10-CM | POA: Diagnosis not present

## 2024-02-23 DIAGNOSIS — R7303 Prediabetes: Secondary | ICD-10-CM | POA: Diagnosis not present

## 2024-02-23 DIAGNOSIS — R5383 Other fatigue: Secondary | ICD-10-CM | POA: Diagnosis not present

## 2024-02-23 DIAGNOSIS — E538 Deficiency of other specified B group vitamins: Secondary | ICD-10-CM | POA: Diagnosis not present

## 2024-02-24 LAB — COMPREHENSIVE METABOLIC PANEL WITH GFR
ALT: 20 IU/L (ref 0–32)
AST: 13 IU/L (ref 0–40)
Albumin: 4.3 g/dL (ref 3.9–4.9)
Alkaline Phosphatase: 149 IU/L — ABNORMAL HIGH (ref 44–121)
BUN/Creatinine Ratio: 11 — ABNORMAL LOW (ref 12–28)
BUN: 8 mg/dL (ref 8–27)
Bilirubin Total: 0.7 mg/dL (ref 0.0–1.2)
CO2: 20 mmol/L (ref 20–29)
Calcium: 9.8 mg/dL (ref 8.7–10.3)
Chloride: 108 mmol/L — ABNORMAL HIGH (ref 96–106)
Creatinine, Ser: 0.75 mg/dL (ref 0.57–1.00)
Globulin, Total: 2.1 g/dL (ref 1.5–4.5)
Glucose: 115 mg/dL — ABNORMAL HIGH (ref 70–99)
Potassium: 4.2 mmol/L (ref 3.5–5.2)
Sodium: 143 mmol/L (ref 134–144)
Total Protein: 6.4 g/dL (ref 6.0–8.5)
eGFR: 91 mL/min/{1.73_m2} (ref >59)

## 2024-02-24 LAB — CBC WITH DIFFERENTIAL/PLATELET
Basophils Absolute: 0.1 10*3/uL (ref 0.0–0.2)
Basos: 1 %
EOS (ABSOLUTE): 0.1 10*3/uL (ref 0.0–0.4)
Eos: 1 %
Hematocrit: 43.8 % (ref 34.0–46.6)
Hemoglobin: 14.6 g/dL (ref 11.1–15.9)
Immature Grans (Abs): 0 10*3/uL (ref 0.0–0.1)
Immature Granulocytes: 0 %
Lymphocytes Absolute: 4.2 10*3/uL — ABNORMAL HIGH (ref 0.7–3.1)
Lymphs: 34 %
MCH: 29 pg (ref 26.6–33.0)
MCHC: 33.3 g/dL (ref 31.5–35.7)
MCV: 87 fL (ref 79–97)
Monocytes Absolute: 1 10*3/uL — ABNORMAL HIGH (ref 0.1–0.9)
Monocytes: 8 %
Neutrophils Absolute: 6.9 10*3/uL (ref 1.4–7.0)
Neutrophils: 56 %
Platelets: 327 10*3/uL (ref 150–450)
RBC: 5.04 x10E6/uL (ref 3.77–5.28)
RDW: 12.9 % (ref 11.7–15.4)
WBC: 12.2 10*3/uL — ABNORMAL HIGH (ref 3.4–10.8)

## 2024-02-24 LAB — TSH+FREE T4
Free T4: 1.48 ng/dL (ref 0.82–1.77)
TSH: 1.74 u[IU]/mL (ref 0.450–4.500)

## 2024-02-24 LAB — LIPID PANEL WITH LDL/HDL RATIO
Cholesterol, Total: 225 mg/dL — ABNORMAL HIGH (ref 100–199)
HDL: 47 mg/dL (ref >39)
LDL Chol Calc (NIH): 152 mg/dL — ABNORMAL HIGH (ref 0–99)
LDL/HDL Ratio: 3.2 ratio (ref 0.0–3.2)
Triglycerides: 145 mg/dL (ref 0–149)
VLDL Cholesterol Cal: 26 mg/dL (ref 5–40)

## 2024-02-24 LAB — VITAMIN D 25 HYDROXY (VIT D DEFICIENCY, FRACTURES): Vit D, 25-Hydroxy: 30.4 ng/mL (ref 30.0–100.0)

## 2024-02-24 LAB — B12 AND FOLATE PANEL
Folate: 14.8 ng/mL (ref >3.0)
Vitamin B-12: 503 pg/mL (ref 232–1245)

## 2024-02-24 LAB — HGB A1C W/O EAG: Hgb A1c MFr Bld: 6 % — ABNORMAL HIGH (ref 4.8–5.6)

## 2024-02-27 ENCOUNTER — Encounter: Payer: Self-pay | Admitting: Physician Assistant

## 2024-02-27 ENCOUNTER — Ambulatory Visit (INDEPENDENT_AMBULATORY_CARE_PROVIDER_SITE_OTHER): Admitting: Physician Assistant

## 2024-02-27 VITALS — BP 130/90 | HR 78 | Temp 97.8°F | Resp 16 | Ht 65.0 in | Wt 181.0 lb

## 2024-02-27 DIAGNOSIS — I10 Essential (primary) hypertension: Secondary | ICD-10-CM | POA: Diagnosis not present

## 2024-02-27 DIAGNOSIS — R7303 Prediabetes: Secondary | ICD-10-CM

## 2024-02-27 DIAGNOSIS — M545 Low back pain, unspecified: Secondary | ICD-10-CM | POA: Diagnosis not present

## 2024-02-27 DIAGNOSIS — E782 Mixed hyperlipidemia: Secondary | ICD-10-CM

## 2024-02-27 DIAGNOSIS — M5442 Lumbago with sciatica, left side: Secondary | ICD-10-CM

## 2024-02-27 NOTE — Progress Notes (Signed)
 [facility] [address] [place], [place] [address]  Internal MEDICINE  Office Visit Note  Patient Name: Jamie Williams  [id number]  [id number]  Date of Service: 03/14/2024  Chief Complaint  Patient presents with   Follow-up    Labs    Anxiety   Hypertension    HPI Pt is here for routine follow up -saw [facility] and was referred to spine specialist and has appt later this week -PT started now, 2x per week, pain doing better -BP at home, 130s/90s  before meds -taking amlodipine , hydrochlorothiazide , lisinopril  can get headache taking all at once. Will move lisinopril  to evening -Vit D--has not been taking supplement recently and will restart  -WBC a little elevated still but improving from before. Did have another flare with knee over the weekend but improving again now. WBC do appear to be chronically elevated based on prior labs in chart, with the exception of a normal count 1 year ago. -Cholesterol elevated--has not taken pravastatin  recently and will restart -A1c stable at 6.0  Current Medication: Outpatient Encounter Medications as of 02/27/2024  Medication Sig   albuterol  (VENTOLIN  HFA) 108 (90 Base) MCG/ACT inhaler Inhale 2 puffs into the lungs every 6 (six) hours as needed for wheezing or shortness of breath.   amLODipine  (NORVASC ) 10 MG tablet Take 1 tablet (10 mg total) by mouth daily. OFFICE VISIT NEEDED FOR ADDITIONAL REFILLS   cholecalciferol (VITAMIN D ) 1000 units tablet Take 2,000 Units by mouth daily.    colchicine  0.6 MG tablet Take 2 tablets (1.2 mg) on first day of gout flare, then 1 tablet daily   cyclobenzaprine  (FLEXERIL ) 10 MG tablet Take 1 tablet (10 mg total) by mouth 3 (three) times daily as needed for muscle spasms. Take one tab po qhs for back spasm prn only   erythromycin  ophthalmic ointment Place 1 Application into the left eye at bedtime.   estradiol  (ESTRACE ) 2 MG tablet TAKE 1 TABLET(2 MG) BY MOUTH DAILY   gabapentin  (NEURONTIN ) 300  MG capsule TAKE 1 CAPSULE BY MOUTH THREE TIMES DAILY   hydrochlorothiazide  (HYDRODIURIL ) 25 MG tablet TAKE 1 TABLET(25 MG) BY MOUTH DAILY   levothyroxine  (SYNTHROID ) 50 MCG tablet TAKE 1 TABLET BY MOUTH EVERY DAY   lisinopril  (ZESTRIL ) 5 MG tablet Take 1 tablet (5 mg total) by mouth daily.   meloxicam  (MOBIC ) 15 MG tablet TAKE 1 TABLET(15 MG) BY MOUTH DAILY WITH FOOD   nicotine  (NICODERM CQ  - DOSED IN MG/24 HR) 7 mg/24hr patch PLACE 1 PATCH ONTO THE SKIN DAILY   PARoxetine  (PAXIL ) 30 MG tablet TAKE 2 TABLETS(60 MG) BY MOUTH DAILY   pravastatin  (PRAVACHOL ) 40 MG tablet TAKE 1 TABLET(40 MG) BY MOUTH DAILY   promethazine  (PHENERGAN ) 25 MG tablet TAKE 1 TABLET(25 MG) BY MOUTH EVERY 6 HOURS AS NEEDED FOR NAUSEA OR VOMITING   No facility-administered encounter medications on file as of 02/27/2024.    Surgical History: Past Surgical History:  Procedure Laterality Date   ANTERIOR CERVICAL DECOMP/DISCECTOMY FUSION N/A 12/24/2015   Procedure: Cervical five-six, Cervical six-seven  Anterior cervical decompression/diskectomy/fusion/interbody prosthesis/plate;  Surgeon: Garry Kansas, MD;  Location: [facility];  Service: Neurosurgery;  Laterality: N/A;   COLONOSCOPY WITH PROPOFOL  N/A 07/17/2018   Procedure: COLONOSCOPY WITH PROPOFOL ;  Surgeon: Luke Salaam, MD;  Location: [facility] ENDOSCOPY;  Service: Gastroenterology;  Laterality: N/A;   ECTOPIC PREGNANCY SURGERY  1991 and 1996   two Etopic preg.   Head Injuries  2006   surgery 2006 Cram/NS in [place]. Headaches  with increased intracranial pressure. Repeat MRI in 2008 Negative   SUPRACERVICAL ABDOMINAL HYSTERECTOMY  2011   Abdominal; Ovaries intact; CERVIX INTACT. Fibroids/dys menorrhea. Weaver-Lee   Ulnar Neuropathy Right 2004   Ulnar Neuropathy surgical revision. 2004-2008. [place].    Medical History: Past Medical History:  Diagnosis Date   Anxiety    Family history of adverse reaction to anesthesia    "sister had a difficult time waking  up from mastectomy and passed away"    GERD (gastroesophageal reflux disease)    History of chicken pox    History of kidney stones    Hyperlipidemia    Hypertension    PONV (postoperative nausea and vomiting)    nausea   Sciatic nerve pain     Family History: Family History  Problem Relation Age of Onset   Hypertension Mother    Diabetes Mother        Type 2   Breast cancer Mother 42   Cancer Sister 13       Breast   Diabetes Sister        type 2   Breast cancer Sister 51   Breast cancer Sister    Diabetes Sister    Diabetes Sister    Diabetes Sister        type 2   Liver cancer Brother    Stomach cancer Brother     Social History   Socioeconomic History   Marital status: Married    Spouse name: Not on file   Number of children: 1   Years of education: Not on file   Highest education level: 12th grade  Occupational History   Occupation: disabled  Tobacco Use   Smoking status: Every Day    Current packs/day: 0.50    Types: Cigarettes   Smokeless tobacco: Never   Tobacco comments:    5 cigarettes daily.  Vaping Use   Vaping status: Never Used  Substance and Sexual Activity   Alcohol use: Not Currently    Alcohol/week: 0.0 standard drinks of alcohol   Drug use: Not Currently   Sexual activity: Yes    Birth control/protection: None  Other Topics Concern   Not on file  Social History Narrative   Not on file   Social Drivers of Health   Financial Resource Strain: Low Risk  (08/24/2023)   Overall Financial Resource Strain (CARDIA)    Difficulty of Paying Living Expenses: Not hard at all  Food Insecurity: No Food Insecurity (08/24/2023)   Hunger Vital Sign    Worried About Running Out of Food in the Last Year: Never true    Ran Out of Food in the Last Year: Never true  Transportation Needs: No Transportation Needs (08/24/2023)   PRAPARE - Administrator, Civil Service (Medical): No    Lack of Transportation (Non-Medical): No  Physical  Activity: Insufficiently Active (08/24/2023)   Exercise Vital Sign    Days of Exercise per Week: 1 day    Minutes of Exercise per Session: 10 min  Stress: Stress Concern Present (08/24/2023)   [employer] of Occupational Health - Occupational Stress Questionnaire    Feeling of Stress : To some extent  Social Connections: Moderately Isolated (08/24/2023)   Social Connection and Isolation Panel [NHANES]    Frequency of Communication with Friends and Family: Once a week    Frequency of Social Gatherings with Friends and Family: Once a week    Attends Religious Services: 1 to 4 times per year  Active Member of Clubs or Organizations: No    Attends Banker Meetings: Never    Marital Status: Married  Catering manager Violence: Not At Risk (08/24/2023)   Humiliation, Afraid, Rape, and Kick questionnaire    Fear of Current or Ex-Partner: No    Emotionally Abused: No    Physically Abused: No    Sexually Abused: No      Review of Systems  Constitutional:  Negative for chills, fatigue and unexpected weight change.  HENT:  Negative for congestion, rhinorrhea, sneezing and sore throat.   Eyes:  Negative for redness.  Respiratory:  Negative for cough, chest tightness and shortness of breath.   Cardiovascular:  Negative for chest pain and palpitations.  Gastrointestinal:  Negative for vomiting.  Genitourinary:  Negative for dysuria and frequency.  Musculoskeletal:  Positive for arthralgias and back pain. Negative for gait problem, joint swelling and neck pain.  Skin:  Negative for rash.  Neurological:  Negative for tremors.  Hematological:  Negative for adenopathy. Does not bruise/bleed easily.  Psychiatric/Behavioral:  Negative for behavioral problems (Depression) and suicidal ideas. The patient is not nervous/anxious.     Vital Signs: BP (!) 130/90   Pulse 78   Temp 97.8 F (36.6 C)   Resp 16   Ht 5\' 5"  (1.651 m)   Wt 181 lb (82.1 kg)   SpO2 95%   BMI 30.12  kg/m    Physical Exam Vitals and nursing note reviewed.  Constitutional:      Appearance: Normal appearance.  HENT:     Head: Normocephalic and atraumatic.  Eyes:     Extraocular Movements: Extraocular movements intact.  Cardiovascular:     Rate and Rhythm: Normal rate and regular rhythm.  Pulmonary:     Effort: Pulmonary effort is normal.     Breath sounds: Normal breath sounds.  Musculoskeletal:     Right lower leg: No edema.     Left lower leg: No edema.  Skin:    General: Skin is warm and dry.  Neurological:     General: No focal deficit present.     Mental Status: She is alert.  Psychiatric:        Mood and Affect: Mood normal.        Behavior: Behavior normal.        Assessment/Plan: 1. Primary hypertension (Primary) Borderline in office and will adjust timing of medications and monitor  2. Mixed hyperlipidemia Restart pravastatin   3. Pre-diabetes Continue to work on diet and exercise  4. Acute left-sided low back pain with left-sided sciatica Improving, followed by PT and ortho   General Counseling: Jamie Williams understanding of the findings of todays visit and agrees with plan of treatment. I have discussed any further diagnostic evaluation that may be needed or ordered today. We also reviewed her medications today. she has been encouraged to call the office with any questions or concerns that should arise related to todays visit.    No orders of the defined types were placed in this encounter.   No orders of the defined types were placed in this encounter.   This patient was seen by Taylor Favia, PA-C in collaboration with Dr. Verneta Gone as a part of collaborative care agreement.   Total time spent:30 Minutes Time spent includes review of chart, medications, test results, and follow up plan with the patient.      Dr Fozia M Khan Internal medicine

## 2024-03-05 DIAGNOSIS — M545 Low back pain, unspecified: Secondary | ICD-10-CM | POA: Diagnosis not present

## 2024-03-08 DIAGNOSIS — M545 Low back pain, unspecified: Secondary | ICD-10-CM | POA: Diagnosis not present

## 2024-03-15 DIAGNOSIS — M545 Low back pain, unspecified: Secondary | ICD-10-CM | POA: Diagnosis not present

## 2024-03-16 DIAGNOSIS — M5416 Radiculopathy, lumbar region: Secondary | ICD-10-CM | POA: Diagnosis not present

## 2024-03-22 DIAGNOSIS — M545 Low back pain, unspecified: Secondary | ICD-10-CM | POA: Diagnosis not present

## 2024-03-26 ENCOUNTER — Emergency Department

## 2024-03-26 ENCOUNTER — Observation Stay
Admission: EM | Admit: 2024-03-26 | Discharge: 2024-03-28 | Disposition: A | Discharge status: Home / Self Care | Attending: Student | Admitting: Student

## 2024-03-26 ENCOUNTER — Other Ambulatory Visit: Payer: Self-pay

## 2024-03-26 ENCOUNTER — Telehealth: Payer: Self-pay

## 2024-03-26 DIAGNOSIS — M4807 Spinal stenosis, lumbosacral region: Secondary | ICD-10-CM | POA: Diagnosis not present

## 2024-03-26 DIAGNOSIS — Z7982 Long term (current) use of aspirin: Secondary | ICD-10-CM | POA: Insufficient documentation

## 2024-03-26 DIAGNOSIS — Z6830 Body mass index (BMI) 30.0-30.9, adult: Secondary | ICD-10-CM | POA: Insufficient documentation

## 2024-03-26 DIAGNOSIS — M47816 Spondylosis without myelopathy or radiculopathy, lumbar region: Secondary | ICD-10-CM | POA: Diagnosis not present

## 2024-03-26 DIAGNOSIS — R32 Unspecified urinary incontinence: Secondary | ICD-10-CM | POA: Diagnosis not present

## 2024-03-26 DIAGNOSIS — G629 Polyneuropathy, unspecified: Secondary | ICD-10-CM

## 2024-03-26 DIAGNOSIS — M5127 Other intervertebral disc displacement, lumbosacral region: Secondary | ICD-10-CM | POA: Diagnosis not present

## 2024-03-26 DIAGNOSIS — F17210 Nicotine dependence, cigarettes, uncomplicated: Secondary | ICD-10-CM | POA: Insufficient documentation

## 2024-03-26 DIAGNOSIS — I639 Cerebral infarction, unspecified: Secondary | ICD-10-CM | POA: Diagnosis not present

## 2024-03-26 DIAGNOSIS — R2689 Other abnormalities of gait and mobility: Secondary | ICD-10-CM | POA: Diagnosis not present

## 2024-03-26 DIAGNOSIS — E785 Hyperlipidemia, unspecified: Secondary | ICD-10-CM | POA: Insufficient documentation

## 2024-03-26 DIAGNOSIS — M5416 Radiculopathy, lumbar region: Secondary | ICD-10-CM | POA: Diagnosis not present

## 2024-03-26 DIAGNOSIS — R29898 Other symptoms and signs involving the musculoskeletal system: Secondary | ICD-10-CM | POA: Diagnosis not present

## 2024-03-26 DIAGNOSIS — I6389 Other cerebral infarction: Secondary | ICD-10-CM | POA: Diagnosis not present

## 2024-03-26 DIAGNOSIS — Z79899 Other long term (current) drug therapy: Secondary | ICD-10-CM | POA: Diagnosis not present

## 2024-03-26 DIAGNOSIS — I10 Essential (primary) hypertension: Secondary | ICD-10-CM

## 2024-03-26 DIAGNOSIS — M545 Low back pain, unspecified: Secondary | ICD-10-CM | POA: Diagnosis not present

## 2024-03-26 DIAGNOSIS — M47817 Spondylosis without myelopathy or radiculopathy, lumbosacral region: Secondary | ICD-10-CM | POA: Diagnosis not present

## 2024-03-26 DIAGNOSIS — G936 Cerebral edema: Secondary | ICD-10-CM | POA: Diagnosis not present

## 2024-03-26 DIAGNOSIS — R262 Difficulty in walking, not elsewhere classified: Principal | ICD-10-CM

## 2024-03-26 DIAGNOSIS — E039 Hypothyroidism, unspecified: Secondary | ICD-10-CM

## 2024-03-26 DIAGNOSIS — M533 Sacrococcygeal disorders, not elsewhere classified: Secondary | ICD-10-CM | POA: Diagnosis not present

## 2024-03-26 DIAGNOSIS — R202 Paresthesia of skin: Secondary | ICD-10-CM | POA: Diagnosis not present

## 2024-03-26 DIAGNOSIS — I6381 Other cerebral infarction due to occlusion or stenosis of small artery: Secondary | ICD-10-CM | POA: Diagnosis present

## 2024-03-26 LAB — CBC
HCT: 44.8 % (ref 36.0–46.0)
Hemoglobin: 14.6 g/dL (ref 12.0–15.0)
MCH: 28.7 pg (ref 26.0–34.0)
MCHC: 32.6 g/dL (ref 30.0–36.0)
MCV: 88.2 fL (ref 80.0–100.0)
Platelets: 323 10*3/uL (ref 150–400)
RBC: 5.08 MIL/uL (ref 3.87–5.11)
RDW: 13.3 % (ref 11.5–15.5)
WBC: 9.2 10*3/uL (ref 4.0–10.5)
nRBC: 0 % (ref 0.0–0.2)

## 2024-03-26 LAB — URINALYSIS, ROUTINE W REFLEX MICROSCOPIC
Bilirubin Urine: NEGATIVE
Glucose, UA: NEGATIVE mg/dL
Hgb urine dipstick: NEGATIVE
Ketones, ur: NEGATIVE mg/dL
Leukocytes,Ua: NEGATIVE
Nitrite: NEGATIVE
Protein, ur: NEGATIVE mg/dL
Specific Gravity, Urine: 1.014 (ref 1.005–1.030)
pH: 6 (ref 5.0–8.0)

## 2024-03-26 LAB — COMPREHENSIVE METABOLIC PANEL WITH GFR
ALT: 15 U/L (ref 0–44)
AST: 15 U/L (ref 15–41)
Albumin: 4.1 g/dL (ref 3.5–5.0)
Alkaline Phosphatase: 116 U/L (ref 38–126)
Anion gap: 12 (ref 5–15)
BUN: 10 mg/dL (ref 8–23)
CO2: 22 mmol/L (ref 22–32)
Calcium: 9.4 mg/dL (ref 8.9–10.3)
Chloride: 109 mmol/L (ref 98–111)
Creatinine, Ser: 0.74 mg/dL (ref 0.44–1.00)
GFR, Estimated: >60 mL/min (ref >60)
Glucose, Bld: 117 mg/dL — ABNORMAL HIGH (ref 70–99)
Potassium: 3.6 mmol/L (ref 3.5–5.1)
Sodium: 143 mmol/L (ref 135–145)
Total Bilirubin: 0.9 mg/dL (ref 0.0–1.2)
Total Protein: 7.5 g/dL (ref 6.5–8.1)

## 2024-03-26 LAB — CBG MONITORING, ED: Glucose-Capillary: 126 mg/dL — ABNORMAL HIGH (ref 70–99)

## 2024-03-26 MED ORDER — LIDOCAINE 5 % EX PTCH
1.0000 | MEDICATED_PATCH | CUTANEOUS | Status: DC
  Administered 2024-03-26 – 2024-03-27 (×2): 1 via TRANSDERMAL
  Filled 2024-03-26 (×3): qty 1

## 2024-03-26 MED ORDER — METHOCARBAMOL 500 MG PO TABS
500.0000 mg | ORAL_TABLET | Freq: Once | ORAL | Status: AC
  Administered 2024-03-26: 500 mg via ORAL
  Filled 2024-03-26: qty 1

## 2024-03-26 MED ORDER — KETOROLAC TROMETHAMINE 30 MG/ML IJ SOLN
30.0000 mg | Freq: Once | INTRAMUSCULAR | Status: AC
  Administered 2024-03-26: 30 mg via INTRAMUSCULAR
  Filled 2024-03-26: qty 1

## 2024-03-26 MED ORDER — ONDANSETRON 4 MG PO TBDP
4.0000 mg | ORAL_TABLET | Freq: Once | ORAL | Status: AC
  Administered 2024-03-26: 4 mg via ORAL
  Filled 2024-03-26: qty 1

## 2024-03-26 MED ORDER — LORAZEPAM 1 MG PO TABS
1.0000 mg | ORAL_TABLET | Freq: Once | ORAL | Status: AC
  Administered 2024-03-26: 1 mg via ORAL
  Filled 2024-03-26: qty 1

## 2024-03-26 MED ORDER — ACETAMINOPHEN 500 MG PO TABS
1000.0000 mg | ORAL_TABLET | Freq: Once | ORAL | Status: AC
  Administered 2024-03-26: 1000 mg via ORAL
  Filled 2024-03-26: qty 2

## 2024-03-26 NOTE — Telephone Encounter (Signed)
 Patient called to report dizziness with Celebrex given by [facility]. Per Barbra Ley, advised patient to stop Celebrex and can do Flexeril  instead. Also advised patient to go back to [facility] to be re-evaluated.

## 2024-03-26 NOTE — ED Notes (Signed)
Patient to MRI now

## 2024-03-26 NOTE — ED Triage Notes (Addendum)
 Pt reports lower back pain that has been ongoing for 4 months. Pt reports yesterday tingling in right hand. Pt also reports episode os urine incontinence this morning. Pt reports weakness to both legs and now unable to stand. Pt presents in wheelchair. Pt scheduled for [facility] at [facility] on Wednesday but felt this was emergent to refer pt to ED.   Pt reports took celebrex around 6:30pm and reports symptoms started shortly after. [facility] documented allergy to celebrex due to symptoms presentation.

## 2024-03-26 NOTE — ED Notes (Signed)
 Patient to MRI.

## 2024-03-26 NOTE — ED Provider Notes (Signed)
 [facility] Provider Note    Event Date/Time   First MD Initiated Contact with Patient 03/26/24 1600     (approximate)   History   Back Pain   HPI  Jamie Williams is a 63 y.o. female who presents to the ED for evaluation of Back Pain   Review of PCP visit from 1 month ago.  History of hypertension on multiple agents, low back pain recently started PT and referred to [facility] for a spine specialist.  History of remote 2017 ACDF to C5-7.  Patient presents alongside her sister for evaluation of acute on chronic low back pain, leg weakness and 1 episode of nausea being able to make it to the toilet to void.  Patient recently transitioned to Celebrex, took 1 dose yesterday evening and shortly after this she reports generalized bilateral leg weakness and difficulty ambulating, requiring support from walls and nearby furniture to get around her house.  This is abnormal, she typically ambulates independently.  No falls, injury or back trauma.  Typically has preceding left-sided lower back pain and sciatica on the left side for which she has seen a spine specialist with [facility] recently and has an upcoming outpatient MRI scheduled within the next week.  She reports 1 episode of needing to void but because she is moving so slowly with her bilateral leg weakness she could not make it to the toilet in time.  No saddle anesthesia send no fevers.  She has not taken any pain medications today for her back as she is concerned that her symptoms may be due to the recently started Celebrex.  Including not taking her antihypertensives yet.  Physical Exam   Triage Vital Signs: ED Triage Vitals [03/26/24 1546]  Encounter Vitals Group     BP (!) 181/105     Systolic BP Percentile      Diastolic BP Percentile      Pulse Rate 69     Resp 20     Temp 98.1 F (36.7 C)     Temp Source Oral     SpO2 97 %     Weight      Height      Head Circumference      Peak Flow       Pain Score 10     Pain Loc      Pain Education      Exclude from Growth Chart     Most recent vital signs: Vitals:   03/26/24 1900 03/26/24 1946  BP: 138/71   Pulse: 65   Resp: 17   Temp:  97.9 F (36.6 C)  SpO2: 100%     General: Awake, no distress.  Laying on her right side and looks mildly uncomfortable, but is pleasant and conversational.  Able to turn over with minimal one-person assist onto her back so I can examine her CV:  Good peripheral perfusion.  Resp:  Normal effort.  Abd:  No distention.  Mild lower abdominal tenderness without guarding or peritoneal features.  Upper abdomen is benign. MSK:  No deformity noted.  Tenderness to left-sided paraspinal lumbar back without midline tenderness or bony step-offs.  No tenderness to the right sided paraspinal lumbar back. Neuro:  No focal deficits appreciated. Cranial nerves II through XII intact 5/5 strength and sensation in all 4 extremities Other:     ED Results / Procedures / Treatments   Labs (all labs ordered are listed, but only abnormal results are displayed) Labs Reviewed  COMPREHENSIVE METABOLIC PANEL WITH GFR - Abnormal; Notable for the following components:      Result Value   Glucose, Bld 117 (*)    All other components within normal limits  URINALYSIS, ROUTINE W REFLEX MICROSCOPIC - Abnormal; Notable for the following components:   Color, Urine YELLOW (*)    APPearance CLEAR (*)    All other components within normal limits  CBG MONITORING, ED - Abnormal; Notable for the following components:   Glucose-Capillary 126 (*)    All other components within normal limits  CBC    EKG   RADIOLOGY   Official radiology report(s): MR LUMBAR SPINE WO CONTRAST Result Date: 03/26/2024 CLINICAL DATA:  Evaluation for cauda equina syndrome, acute on chronic left lower back pain. Urinary incontinence. EXAM: MRI LUMBAR SPINE WITHOUT CONTRAST TECHNIQUE: Multiplanar, multisequence MR imaging of the lumbar spine was  performed. No intravenous contrast was administered. COMPARISON:  Lumbar spine radiograph 10/20/2018. FINDINGS: Segmentation:  Standard. Alignment: Lumbar lordosis is maintained. Trace retrolisthesis of L5 on S1. Vertebrae: Modic type 1 degenerative endplate changes at L5-S1 with associated discogenic edema. No additional significant bone marrow edema or evidence of fracture. Vertebral body heights are maintained. No suspicious osseous lesion. Conus medullaris and cauda equina: Conus extends to the L1-2 level. Conus and cauda equina appear normal. Paraspinal and other soft tissues: The paraspinal soft tissues are unremarkable. Disc levels: T12-L1: No significant disc bulge. No significant spinal canal stenosis or foraminal stenosis. L1-2: No significant disc bulge. No significant spinal canal stenosis or foraminal stenosis. L2-3: No significant disc bulge. No significant spinal canal stenosis or foraminal stenosis. Mild facet arthrosis. L3-4: Disc desiccation and mild disc height loss. Minimal disc bulge with lateral recess narrowing without significant impingement upon the traversing nerve roots. Mild facet arthrosis. No significant spinal canal stenosis. No significant foraminal stenosis. L4-5: Disc desiccation. Minimal disc bulge. Moderate facet arthrosis which indents the dorsal thecal sac. No significant spinal canal stenosis. No significant foraminal stenosis. L5-S1: Disc desiccation and moderate disc height loss. Diffuse disc bulge slightly eccentric to the left with resulting in lateral recess narrowing with possible impingement upon the traversing S1 nerve roots, left greater than right. Bilateral facet arthrosis. Mild epidural lipomatosis. Mild narrowing of the thecal sac at this level. There is moderate bilateral foraminal narrowing. Disc bulge and facet osteophytes likely contact and slightly deform the exiting right L5 nerve root. IMPRESSION: Degenerative changes of the lumbar spine as above most  pronounced at L5-S1. Disc bulge results in lateral recess narrowing with possible impingement upon the traversing S1 nerve roots, left greater than right. Disc bulge and facet arthrosis at L5-S1 resulting in moderate bilateral foraminal stenosis. Disc bulge and facet osteophytes likely contact and slightly deform the exiting right L5 nerve root. Degenerative endplate changes and edema at L5-S1 which may also contribute to back pain. Electronically Signed   By: Denny Flack M.D.   On: 03/26/2024 19:48    PROCEDURES and INTERVENTIONS:  Procedures  Medications  lidocaine  (LIDODERM ) 5 % 1 patch (1 patch Transdermal Patch Applied 03/26/24 1636)  ketorolac  (TORADOL ) 30 MG/ML injection 30 mg (30 mg Intramuscular Given 03/26/24 1646)  acetaminophen  (TYLENOL ) tablet 1,000 mg (1,000 mg Oral Given 03/26/24 1647)  ondansetron  (ZOFRAN -ODT) disintegrating tablet 4 mg (4 mg Oral Given 03/26/24 1647)  methocarbamol (ROBAXIN) tablet 500 mg (500 mg Oral Given 03/26/24 1647)  LORazepam (ATIVAN) tablet 1 mg (1 mg Oral Given 03/26/24 1647)     IMPRESSION / MDM / ASSESSMENT AND PLAN /  ED COURSE  I reviewed the triage vital signs and the nursing notes.  Differential diagnosis includes, but is not limited to, cauda equina, medication side effect, pyelonephritis or UTI  {Patient presents with symptoms of an acute illness or injury that is potentially life-threatening.  Patient presents with acute on chronic back pain without signs of cauda equina.  MRI, as above.  Back pain dramatically improved and essentially resolved with a single round of nonnarcotic multimodal analgesia but she still has significant ambulatory dysfunction on her nonfocal exam.  I am concerned about CNS pathology such as CVA.  Blood work and urinalysis are benign with normal CBC, metabolic panel and UA.  Ambulatory dysfunction is significant and acute and I suspect she will require admission regardless of MRI brain results.  Clinical Course as of  03/26/24 2309  Mon Mar 26, 2024  2004 Reassessed.  Patient sitting upright in the stretcher, looking well.  She reports feeling better with improved and controlled pain.  We discussed MRI results and overall workup and plan of care.  She would like to try to get up with a walker which I think would be best. [DS]  2025 I reassessed with nursing staff using a walker.  Patient reports her back pain is significantly improved but when getting up she was extremely unsteady, shuffling gait, reports continued paresthesias to her right hand.  Clearly not at her baseline.  Discussed MRI of her brain to assess for stroke and she is agreeable [DS]    Clinical Course User Index [DS] Arline Bennett, MD     FINAL CLINICAL IMPRESSION(S) / ED DIAGNOSES   Final diagnoses:  None     Rx / DC Orders   ED Discharge Orders     None        Note:  This document was prepared using Dragon voice recognition software and may include unintentional dictation errors.   Arline Bennett, MD 03/26/24 [phone]

## 2024-03-27 ENCOUNTER — Observation Stay

## 2024-03-27 DIAGNOSIS — I6381 Other cerebral infarction due to occlusion or stenosis of small artery: Secondary | ICD-10-CM | POA: Diagnosis present

## 2024-03-27 DIAGNOSIS — F17200 Nicotine dependence, unspecified, uncomplicated: Secondary | ICD-10-CM | POA: Diagnosis not present

## 2024-03-27 DIAGNOSIS — G629 Polyneuropathy, unspecified: Secondary | ICD-10-CM

## 2024-03-27 DIAGNOSIS — I739 Peripheral vascular disease, unspecified: Secondary | ICD-10-CM

## 2024-03-27 DIAGNOSIS — I639 Cerebral infarction, unspecified: Secondary | ICD-10-CM | POA: Diagnosis present

## 2024-03-27 DIAGNOSIS — I10 Essential (primary) hypertension: Secondary | ICD-10-CM

## 2024-03-27 DIAGNOSIS — E039 Hypothyroidism, unspecified: Secondary | ICD-10-CM

## 2024-03-27 DIAGNOSIS — I6523 Occlusion and stenosis of bilateral carotid arteries: Secondary | ICD-10-CM | POA: Diagnosis not present

## 2024-03-27 DIAGNOSIS — I6350 Cerebral infarction due to unspecified occlusion or stenosis of unspecified cerebral artery: Secondary | ICD-10-CM | POA: Diagnosis not present

## 2024-03-27 DIAGNOSIS — R262 Difficulty in walking, not elsewhere classified: Principal | ICD-10-CM

## 2024-03-27 DIAGNOSIS — I6389 Other cerebral infarction: Secondary | ICD-10-CM | POA: Diagnosis not present

## 2024-03-27 DIAGNOSIS — E785 Hyperlipidemia, unspecified: Secondary | ICD-10-CM | POA: Insufficient documentation

## 2024-03-27 LAB — BASIC METABOLIC PANEL WITH GFR
Anion gap: 11 (ref 5–15)
BUN: 15 mg/dL (ref 8–23)
CO2: 22 mmol/L (ref 22–32)
Calcium: 9.6 mg/dL (ref 8.9–10.3)
Chloride: 106 mmol/L (ref 98–111)
Creatinine, Ser: 0.86 mg/dL (ref 0.44–1.00)
GFR, Estimated: >60 mL/min (ref >60)
Glucose, Bld: 115 mg/dL — ABNORMAL HIGH (ref 70–99)
Potassium: 4 mmol/L (ref 3.5–5.1)
Sodium: 139 mmol/L (ref 135–145)

## 2024-03-27 LAB — LIPID PANEL
Cholesterol: 213 mg/dL — ABNORMAL HIGH (ref 0–200)
HDL: 50 mg/dL (ref >40)
LDL Cholesterol: 140 mg/dL — ABNORMAL HIGH (ref 0–99)
Total CHOL/HDL Ratio: 4.3 ratio
Triglycerides: 115 mg/dL (ref <150)
VLDL: 23 mg/dL (ref 0–40)

## 2024-03-27 LAB — CBC
HCT: 43.8 % (ref 36.0–46.0)
Hemoglobin: 14.5 g/dL (ref 12.0–15.0)
MCH: 28.7 pg (ref 26.0–34.0)
MCHC: 33.1 g/dL (ref 30.0–36.0)
MCV: 86.7 fL (ref 80.0–100.0)
Platelets: 299 10*3/uL (ref 150–400)
RBC: 5.05 MIL/uL (ref 3.87–5.11)
RDW: 13.4 % (ref 11.5–15.5)
WBC: 8.6 10*3/uL (ref 4.0–10.5)
nRBC: 0 % (ref 0.0–0.2)

## 2024-03-27 LAB — HIV ANTIBODY (ROUTINE TESTING W REFLEX): HIV Screen 4th Generation wRfx: NONREACTIVE

## 2024-03-27 MED ORDER — MAGNESIUM HYDROXIDE 400 MG/5ML PO SUSP: 30.0000 mL | Freq: Every day | ORAL | Status: DC | PRN

## 2024-03-27 MED ORDER — MORPHINE SULFATE (PF) 2 MG/ML IV SOLN: 2.0000 mg | INTRAVENOUS | Status: DC | PRN

## 2024-03-27 MED ORDER — OXYCODONE HCL 5 MG PO TABS
5.0000 mg | ORAL_TABLET | ORAL | Status: DC | PRN
  Administered 2024-03-27: 10 mg via ORAL
  Filled 2024-03-27: qty 2
  Filled 2024-03-27: qty 1

## 2024-03-27 MED ORDER — IOHEXOL 350 MG/ML SOLN
75.0000 mL | Freq: Once | INTRAVENOUS | Status: AC | PRN
Start: 2024-03-27 — End: 2024-03-27
  Administered 2024-03-27: 75 mL via INTRAVENOUS

## 2024-03-27 MED ORDER — ENOXAPARIN SODIUM 40 MG/0.4ML IJ SOSY
40.0000 mg | PREFILLED_SYRINGE | INTRAMUSCULAR | Status: DC
  Administered 2024-03-28: 40 mg via SUBCUTANEOUS
  Filled 2024-03-27: qty 0.4

## 2024-03-27 MED ORDER — CLOPIDOGREL BISULFATE 75 MG PO TABS
75.0000 mg | ORAL_TABLET | Freq: Every day | ORAL | Status: DC
  Administered 2024-03-28: 75 mg via ORAL
  Filled 2024-03-27: qty 1

## 2024-03-27 MED ORDER — SODIUM CHLORIDE 0.9 % IV SOLN
INTRAVENOUS | Status: DC
Start: 2024-03-27 — End: 2024-03-28

## 2024-03-27 MED ORDER — LISINOPRIL 5 MG PO TABS
5.0000 mg | ORAL_TABLET | Freq: Every day | ORAL | Status: DC
  Administered 2024-03-27 – 2024-03-28 (×2): 5 mg via ORAL
  Filled 2024-03-27 (×2): qty 1

## 2024-03-27 MED ORDER — COLCHICINE 0.6 MG PO TABS: 0.6000 mg | ORAL_TABLET | Freq: Two times a day (BID) | ORAL | Status: DC | PRN

## 2024-03-27 MED ORDER — ENOXAPARIN SODIUM 40 MG/0.4ML IJ SOSY
0.5000 mg/kg | PREFILLED_SYRINGE | INTRAMUSCULAR | Status: DC
  Administered 2024-03-27: 42.5 mg via SUBCUTANEOUS
  Filled 2024-03-27: qty 0.8

## 2024-03-27 MED ORDER — ACETAMINOPHEN 650 MG RE SUPP: 650.0000 mg | Freq: Four times a day (QID) | RECTAL | Status: DC | PRN

## 2024-03-27 MED ORDER — ASPIRIN 81 MG PO TBEC
81.0000 mg | DELAYED_RELEASE_TABLET | Freq: Every day | ORAL | Status: DC
  Administered 2024-03-27 – 2024-03-28 (×2): 81 mg via ORAL
  Filled 2024-03-27 (×2): qty 1

## 2024-03-27 MED ORDER — ATORVASTATIN CALCIUM 20 MG PO TABS
80.0000 mg | ORAL_TABLET | Freq: Every day | ORAL | Status: DC
  Administered 2024-03-27: 80 mg via ORAL
  Filled 2024-03-27: qty 4

## 2024-03-27 MED ORDER — ONDANSETRON HCL 4 MG PO TABS
4.0000 mg | ORAL_TABLET | Freq: Four times a day (QID) | ORAL | Status: DC | PRN
Start: 2024-03-27 — End: 2024-03-28

## 2024-03-27 MED ORDER — ONDANSETRON HCL 4 MG/2ML IJ SOLN
4.0000 mg | Freq: Four times a day (QID) | INTRAMUSCULAR | Status: DC | PRN
Start: 2024-03-27 — End: 2024-03-28

## 2024-03-27 MED ORDER — CLOPIDOGREL BISULFATE 75 MG PO TABS: 75.0000 mg | ORAL_TABLET | Freq: Every day | ORAL | Status: DC

## 2024-03-27 MED ORDER — ACETAMINOPHEN 325 MG PO TABS
650.0000 mg | ORAL_TABLET | Freq: Four times a day (QID) | ORAL | Status: DC | PRN
  Administered 2024-03-27 (×2): 650 mg via ORAL
  Filled 2024-03-27 (×2): qty 2

## 2024-03-27 MED ORDER — NICOTINE 7 MG/24HR TD PT24
7.0000 mg | MEDICATED_PATCH | Freq: Every day | TRANSDERMAL | Status: DC
  Filled 2024-03-27 (×2): qty 1

## 2024-03-27 MED ORDER — CLOPIDOGREL BISULFATE 75 MG PO TABS
300.0000 mg | ORAL_TABLET | Freq: Once | ORAL | Status: AC
  Administered 2024-03-27: 300 mg via ORAL
  Filled 2024-03-27: qty 4

## 2024-03-27 MED ORDER — TRAZODONE HCL 50 MG PO TABS: 25.0000 mg | ORAL_TABLET | Freq: Every evening | ORAL | Status: DC | PRN

## 2024-03-27 MED ORDER — STROKE: EARLY STAGES OF RECOVERY BOOK: Freq: Once | Status: AC

## 2024-03-27 MED ORDER — CYCLOBENZAPRINE HCL 10 MG PO TABS: 10.0000 mg | ORAL_TABLET | Freq: Three times a day (TID) | ORAL | Status: DC | PRN

## 2024-03-27 MED ORDER — VITAMIN D 25 MCG (1000 UNIT) PO TABS
2000.0000 [IU] | ORAL_TABLET | Freq: Every day | ORAL | Status: DC
  Administered 2024-03-27 – 2024-03-28 (×2): 2000 [IU] via ORAL
  Filled 2024-03-27 (×2): qty 2

## 2024-03-27 NOTE — Assessment & Plan Note (Addendum)
-   The patient will be admitted to an observation medically monitored bed.   - We will follow neuro checks q.4 hours for 24 hours.   - The patient will be placed on aspirin and Plavix.   - Will obtain a brain MRI without contrast as well as bilateral carotid Doppler and 2D echo with bubble study .   - A neurology consultation  as well as physical/occupation/speech therapy consults will be obtained in a.m.Aaron Aas - I notified Dr. Cleone Dad about the patient  - The patient will be placed on statin therapy and fasting lipids will be checked.

## 2024-03-27 NOTE — ED Notes (Signed)
 Patient assisted by this RN to ambulate to the restroom. Patient was unsteady and had several episodes of becoming very "wobbly". Patient also stated that she did not feel stable. This RN assisted patient back to bed and placed the bed in lowest position.

## 2024-03-27 NOTE — Evaluation (Signed)
 Physical Therapy Evaluation Patient Details Name: Jamie Williams MRN: [id number] DOB: 1961/09/10 Today's Date: 03/27/2024  History of Present Illness  Pt is a 63 y.o. female who presented to the emergency room with acute onset of back pain which has been going on for a month for which she has been seen by Ortho and managed with Celebrex. Experiencing generalized BLE and RUE weakness with difficulty ambulating requiring support from walls and nearby furniture to get around house. Brain MRI 03/26/24: Acute left thalamocapsular infarcts. PMH:  anxiety, hypertension, dyslipidemia, and GERD.   Clinical Impression  Pt admitted with above diagnosis. Pt currently with functional limitations due to the deficits listed below (see PT Problem List). Pt received supine in bed agreeable to PT. Reports PTA being indep with all aspects of mobility and ADL's. Lives with son and husband.   To date, neurology present during PT eval doing their assessment with PT. See Neuro note for RAMPS, coordination, MMT, vision testing, etc. Pt with grossly, mild weakness in RUE and RLE. To date, pt exits bed independently, needs increased time for STS with L weight shift and increased time to stand to RW at [facility]. Pt with what appears poor RLE proprioception with foot placement at initial contact with variable step lengths. Notable, BUE support needed on RW and effortful gait but able to complete ~120' total transitioning with supervision after VC's to use line on floor for wider BOS and VC's to reduce RLE step lengths.  Pt in recliner post session with family present. Discussion on pt's DME needs and d/c options with evidence of very supportive family willing to assist pt at d/c. Post session pt and family in agreement to POC. Pending gait progression and stairs training, pt will be safe to d/c home with OPPT referral to address acute RLE strength, balance, and gait deficits to reduce falls risk and maximize return to PLOF.       If  plan is discharge home, recommend the following: A little help with walking and/or transfers;Assistance with cooking/housework;Assist for transportation;Help with stairs or ramp for entrance   Can travel by private vehicle        Equipment Recommendations Rolling walker (2 wheels);BSC/3in1  Recommendations for Other Services       Functional Status Assessment Patient has had a recent decline in their functional status and demonstrates the ability to make significant improvements in function in a reasonable and predictable amount of time.     Precautions / Restrictions Precautions Precautions: Fall Recall of Precautions/Restrictions: Intact Restrictions Weight Bearing Restrictions Per Provider Order: No      Mobility  Bed Mobility Overal bed mobility: Modified Independent               Patient Response: Cooperative  Transfers Overall transfer level: Needs assistance Equipment used: Rolling walker (2 wheels) Transfers: Sit to/from Stand Sit to Stand: Contact guard assist                Ambulation/Gait Ambulation/Gait assistance: Contact guard assist, Supervision Gait Distance (Feet): 120 Feet Assistive device: Rolling walker (2 wheels) Gait Pattern/deviations: Step-through pattern, Decreased step length - left, Decreased stance time - right, Narrow base of support       General Gait Details: Decreased proprioception with foot placement. Needs visual cue for normal BOS and step placement. Slow abd laborious overall.  Stairs            Wheelchair Mobility     Tilt Bed Tilt Bed Patient Response: Cooperative  Modified  Rankin (Stroke Patients Only)       Balance Overall balance assessment: Needs assistance Sitting-balance support: No upper extremity supported, Feet supported Sitting balance-Leahy Scale: Good     Standing balance support: Bilateral upper extremity supported, Reliant on assistive device for balance Standing balance-Leahy Scale:  Fair                               Pertinent Vitals/Pain Pain Assessment Pain Assessment: Faces Faces Pain Scale: Hurts a little bit Pain Location: back/BLE Pain Descriptors / Indicators: Aching, Discomfort Pain Intervention(s): Limited activity within patient's tolerance, Monitored during session, Repositioned    Home Living Family/patient expects to be discharged to:: Private residence Living Arrangements: Spouse/significant other;Children Available Help at Discharge: Family;Available PRN/intermittently (pt states family could take time to be around 24/7 in the short term.) Type of Home: House Home Access: Stairs to enter Entrance Stairs-Rails: Right;Left;Can reach both Secretary/administrator of Steps: 3   Home Layout: One level Home Equipment: None      Prior Function Prior Level of Function : Independent/Modified Independent               ADLs Comments: IND in ADLs     Extremity/Trunk Assessment        Lower Extremity Assessment Lower Extremity Assessment: Generalized weakness       Communication   Communication Communication: No apparent difficulties    Cognition Arousal: Alert Behavior During Therapy: WFL for tasks assessed/performed   PT - Cognitive impairments: No apparent impairments                         Following commands: Intact       Cueing Cueing Techniques: Verbal cues     General Comments      Exercises Other Exercises Other Exercises: Neurology present during PT assessment doing MMT, sensation, Ramps, reflexes, and coordination testing. See note for details.   Assessment/Plan    PT Assessment Patient needs continued PT services  PT Problem List Decreased strength;Decreased mobility;Decreased activity tolerance;Decreased balance;Pain       PT Treatment Interventions DME instruction;Therapeutic exercise;Gait training;Balance training;Stair training;Neuromuscular re-education;Functional mobility  training;Cognitive remediation;Therapeutic activities;Patient/family education    PT Goals (Current goals can be found in the Care Plan section)  Acute Rehab PT Goals Patient Stated Goal: improve her mobility/strength PT Goal Formulation: With patient Time For Goal Achievement: 04/10/24 Potential to Achieve Goals: Good    Frequency 7X/week     Co-evaluation               AM-PAC PT "6 Clicks" Mobility  Outcome Measure Help needed turning from your back to your side while in a flat bed without using bedrails?: A Little Help needed moving from lying on your back to sitting on the side of a flat bed without using bedrails?: A Little Help needed moving to and from a bed to a chair (including a wheelchair)?: A Little Help needed standing up from a chair using your arms (e.g., wheelchair or bedside chair)?: A Little Help needed to walk in hospital room?: A Little Help needed climbing 3-5 steps with a railing? : A Lot 6 Click Score: 17    End of Session Equipment Utilized During Treatment: Gait belt Activity Tolerance: Patient tolerated treatment well Patient left: in chair;with call bell/phone within reach;with chair alarm set;with family/visitor present Nurse Communication: Mobility status PT Visit Diagnosis: Muscle weakness (generalized) (M62.81);Difficulty in walking,  not elsewhere classified (R26.2);Other abnormalities of gait and mobility (R26.89)    Time: 1610-9604 PT Time Calculation (min) (ACUTE ONLY): 30 min   Charges:   PT Evaluation $PT Eval Moderate Complexity: 1 Mod PT Treatments $Gait Training: 8-22 mins PT General Charges $$ ACUTE PT VISIT: 1 Visit        Marc Senior. Fairly IV, PT, DPT Physical Therapist- [place]  03/27/2024, 3:22 PM

## 2024-03-27 NOTE — Assessment & Plan Note (Signed)
-   Will continue antihypertensive therapy with permissive hypertension.

## 2024-03-27 NOTE — ED Notes (Signed)
 Pt placed on bedpan

## 2024-03-27 NOTE — Assessment & Plan Note (Signed)
-   Will continue Synthroid.

## 2024-03-27 NOTE — TOC Initial Note (Signed)
 Transition of Care [facility]) - Initial/Assessment Note    Patient Details  Name: Jamie Williams MRN: [id number] Date of Birth: [birth year]  Transition of Care [facility]) CM/SW Contact:    Elsie Halo, RN Phone Number: 03/27/2024, 1:01 PM  Clinical Narrative:                 Patient lives independently at home with her husband and son. She drives and doesn't report difficulty getting to appts or obtaining medications. She doesn't use any DME. She sees a PCP at [facility] and uses [facility].  No TOC needs, please outreach to [facility] if needs are identified.  Expected Discharge Plan: Home/Self Care Barriers to Discharge: Continued Medical Work up   Patient Goals and CMS Choice            Expected Discharge Plan and Services   Discharge Planning Services: CM Consult   Living arrangements for the past 2 months: Single Family Home                                      Prior Living Arrangements/Services Living arrangements for the past 2 months: Single Family Home Lives with:: Spouse, Adult Children                   Activities of Daily Living   ADL Screening (condition at time of admission) Independently performs ADLs?: Yes (appropriate for developmental age) Is the patient deaf or have difficulty hearing?: No Does the patient have difficulty seeing, even when wearing glasses/contacts?: No Does the patient have difficulty concentrating, remembering, or making decisions?: No  Permission Sought/Granted                  Emotional Assessment       Orientation: : Oriented to Self, Oriented to Place, Oriented to  Time, Oriented to Situation   Psych Involvement: No (comment)  Admission diagnosis:  CVA (cerebral vascular accident) (HCC) [I63.9] Ambulatory dysfunction [R26.2] Cerebrovascular accident (CVA), unspecified mechanism (HCC) [I63.9] Patient Active Problem List   Diagnosis Date Noted   Acute ischemic vertebrobasilar artery thalamic stroke, left  (HCC) 03/27/2024   Essential hypertension 03/27/2024   Hypothyroidism 03/27/2024   Dyslipidemia 03/27/2024   Peripheral neuropathy 03/27/2024   Ambulatory dysfunction 03/27/2024   Hypothyroid 07/22/2020   Chronic, continuous use of opioids 04/15/2020   Pap smear abnormality of cervix/human papillomavirus (HPV) positive 08/06/2019   Aortic atherosclerosis (HCC) 10/23/2018   GERD (gastroesophageal reflux disease)    Menopausal symptoms 01/10/2017   Cervical spondylosis with radiculopathy 12/24/2015   Compression injury of cervical spinal nerve 10/07/2015   Left arm pain 08/29/2015   Neck pain 08/29/2015   Hematuria 07/03/2015   Allergic arthritis 06/27/2015   Arthritis 06/27/2015   Back ache 06/27/2015   Clinical depression 06/27/2015   H/O abnormal cervical Papanicolaou smear 06/27/2015   Elevated intracranial pressure 06/27/2015   Kidney stones 06/27/2015   Knee pain 06/27/2015   Abnormal mammogram 06/27/2015   Headache, migraine 06/27/2015   Neuropathy 06/27/2015   Hand paresthesia 06/27/2015   Pre-diabetes 06/27/2015   Vitamin D  deficiency 06/27/2015   Weight loss 06/27/2015   Adjustment reaction 06/27/2015   Hypercholesterolemia without hypertriglyceridemia 01/29/2010   Tobacco abuse 01/28/2010   Primary hypertension 09/16/2009   Insomnia 09/16/2009   Lesion of ulnar nerve 09/16/2009   PCP:  Jacques Mattock, PA-C Pharmacy:   [facility] [phone] -  [place], [place] - [address] AT [facility] [address] [place] [address] Phone: [phone] Fax: [phone]     Social Drivers of Health (SDOH) Social History: SDOH Screenings   Food Insecurity: No Food Insecurity (03/27/2024)  Housing: Low Risk  (03/27/2024)  Transportation Needs: No Transportation Needs (03/27/2024)  Utilities: Not At Risk (03/27/2024)  Alcohol Screen: Low Risk  (08/24/2023)  Depression (PHQ2-9): Low Risk  (02/06/2024)  Recent Concern: Depression  (PHQ2-9) - Medium Risk (01/20/2024)  Financial Resource Strain: Low Risk  (08/24/2023)  Physical Activity: Insufficiently Active (08/24/2023)  Social Connections: Moderately Isolated (08/24/2023)  Stress: Stress Concern Present (08/24/2023)  Tobacco Use: High Risk (03/26/2024)  Health Literacy: Adequate Health Literacy (08/24/2023)   SDOH Interventions:     Readmission Risk Interventions     No data to display

## 2024-03-27 NOTE — ED Notes (Signed)
 Pt transported to CT at this time.

## 2024-03-27 NOTE — Assessment & Plan Note (Signed)
 Will continue statin therapy

## 2024-03-27 NOTE — ED Provider Notes (Signed)
 MRI of the brain shows acute left thalamic capsular infarct, outside of thrombolytic window, admission.   Jamie Carmin, MD 03/27/24 [phone]

## 2024-03-27 NOTE — ED Notes (Signed)
 Pt returned from CT at this time. Pt to be transported to the unit at this time. This RN called and notified floor of same.

## 2024-03-27 NOTE — H&P (Addendum)
 [place]   PATIENT NAME: Jamie Williams    MR#:  [id number]  DATE OF BIRTH:  [birth year]  DATE OF ADMISSION:  03/26/2024  PRIMARY CARE PHYSICIAN: Jacques Mattock, PA-C   Patient is coming from: Home  REQUESTING/REFERRING PHYSICIAN: Buell Carmin, MD  CHIEF COMPLAINT:   Chief Complaint  Patient presents with   Back Pain    HISTORY OF PRESENT ILLNESS:  Jamie Williams is a 63 y.o. African-American female with medical history significant for anxiety, hypertension, dyslipidemia, and GERD, who presented to the emergency room with acute onset of back pain which has been going on for a month for which she has been seen by Ortho and managed with Celebrex.  She reported taking 1 dose yesterday evening and shortly after that he experienced generalized bilateral lower extremity weakness with difficulty ambulating requiring support from walls and nearby furniture to get around in his house.  She usually ambulates without assistance.  She denies any falls or injuries.  She has a scheduled outpatient LS-spine MRI given his left-sided back pain and left sciatica.  No saddle anesthesia.  She had nausea without vomiting.  No fever or chills.  No chest pain or palpitations.  No cough or wheezing or dyspnea.  No dysuria, oliguria or hematuria or flank pain.  Not any tinnitus or vertigo.  She admits to left-sided headache earlier with dizziness or blurred vision.  ED Course: When he came to the ER, BP was 181/105 with otherwise normal vital signs.  Labs revealed unremarkable CMP and CBC. EKG as reviewed by me : Pending. Imaging: Abbott MRI revealed acute left thalamic capsular infarcts.  The patient was given 1 g of p.o. Tylenol , 30 mg of IM Toradol , Lidoderm  5% patch, Ativan 1 mg p.o., Robaxin 500 mg p.o. and 4 mg of I p.o. Zofran .  He will be admitted to a medically monitored observation bed for further evaluation and management. PAST MEDICAL HISTORY:   Past Medical History:  Diagnosis Date    Anxiety    Family history of adverse reaction to anesthesia    "sister had a difficult time waking up from mastectomy and passed away"    GERD (gastroesophageal reflux disease)    History of chicken pox    History of kidney stones    Hyperlipidemia    Hypertension    PONV (postoperative nausea and vomiting)    nausea   Sciatic nerve pain     PAST SURGICAL HISTORY:   Past Surgical History:  Procedure Laterality Date   ANTERIOR CERVICAL DECOMP/DISCECTOMY FUSION N/A 12/24/2015   Procedure: Cervical five-six, Cervical six-seven  Anterior cervical decompression/diskectomy/fusion/interbody prosthesis/plate;  Surgeon: Garry Kansas, MD;  Location: [facility];  Service: Neurosurgery;  Laterality: N/A;   COLONOSCOPY WITH PROPOFOL  N/A 07/17/2018   Procedure: COLONOSCOPY WITH PROPOFOL ;  Surgeon: Luke Salaam, MD;  Location: [facility];  Service: Gastroenterology;  Laterality: N/A;   ECTOPIC PREGNANCY SURGERY  1991 and 1996   two Etopic preg.   Head Injuries  2006   surgery 2006 Cram/NS in [place]. Headaches with increased intracranial pressure. Repeat MRI in 2008 Negative   SUPRACERVICAL ABDOMINAL HYSTERECTOMY  2011   Abdominal; Ovaries intact; CERVIX INTACT. Fibroids/dys menorrhea. [place]   Ulnar Neuropathy Right 2004   Ulnar Neuropathy surgical revision. 2004-2008. [place].    SOCIAL HISTORY:   Social History   Tobacco Use   Smoking status: Every Day    Current packs/day: 0.50    Types: Cigarettes  Smokeless tobacco: Never   Tobacco comments:    5 cigarettes daily.  Substance Use Topics   Alcohol use: Not Currently    Alcohol/week: 0.0 standard drinks of alcohol    FAMILY HISTORY:   Family History  Problem Relation Age of Onset   Hypertension Mother    Diabetes Mother        Type 2   Breast cancer Mother 72   Cancer Sister 71       Breast   Diabetes Sister        type 2   Breast cancer Sister 89   Breast cancer Sister    Diabetes Sister     Diabetes Sister    Diabetes Sister        type 2   Liver cancer Brother    Stomach cancer Brother     DRUG ALLERGIES:   Allergies  Allergen Reactions   Celebrex [Celecoxib] Other (See Comments)    Dizziness, light-headedness   Aspirin Nausea And Vomiting   Penicillins Rash   Sulfa Antibiotics Hives    REVIEW OF SYSTEMS:   ROS As per history of present illness. All pertinent systems were reviewed above. Constitutional, HEENT, cardiovascular, respiratory, GI, GU, musculoskeletal, neuro, psychiatric, endocrine, integumentary and hematologic systems were reviewed and are otherwise negative/unremarkable except for positive findings mentioned above in the HPI.   MEDICATIONS AT HOME:   Prior to Admission medications   Medication Sig Start Date End Date Taking? Authorizing Provider  cholecalciferol (VITAMIN D ) 1000 units tablet Take 2,000 Units by mouth daily.    Yes [provider]  colchicine  0.6 MG tablet Take 2 tablets (1.2 mg) on first day of gout flare, then 1 tablet daily 01/20/24  Yes Fisher, Erlinda Haws, MD  cyclobenzaprine  (FLEXERIL ) 10 MG tablet Take 1 tablet (10 mg total) by mouth 3 (three) times daily as needed for muscle spasms. Take one tab po qhs for back spasm prn only 02/06/24  Yes McDonough, Lauren K, PA-C  lisinopril  (ZESTRIL ) 5 MG tablet Take 1 tablet (5 mg total) by mouth daily. 02/06/24  Yes McDonough, Lauren K, PA-C  meloxicam  (MOBIC ) 15 MG tablet TAKE 1 TABLET(15 MG) BY MOUTH DAILY WITH FOOD 06/16/23  Yes Lamon Pillow, MD  nicotine  (NICODERM CQ  - DOSED IN MG/24 HR) 7 mg/24hr patch PLACE 1 PATCH ONTO THE SKIN DAILY 08/18/23  Yes Lamon Pillow, MD  promethazine  (PHENERGAN ) 25 MG tablet TAKE 1 TABLET(25 MG) BY MOUTH EVERY 6 HOURS AS NEEDED FOR NAUSEA OR VOMITING 11/02/23  Yes Lamon Pillow, MD  albuterol  (VENTOLIN  HFA) 108 (90 Base) MCG/ACT inhaler Inhale 2 puffs into the lungs every 6 (six) hours as needed for wheezing or shortness of breath. Patient not  taking: Reported on 03/27/2024 12/17/22   Lamon Pillow, MD  amLODipine  (NORVASC ) 10 MG tablet Take 1 tablet (10 mg total) by mouth daily. OFFICE VISIT NEEDED FOR ADDITIONAL REFILLS Patient not taking: Reported on 03/27/2024 09/21/23   Lamon Pillow, MD  erythromycin  ophthalmic ointment Place 1 Application into the left eye at bedtime. 06/08/23   Ostwalt, Janna, PA-C  estradiol  (ESTRACE ) 2 MG tablet TAKE 1 TABLET(2 MG) BY MOUTH DAILY Patient not taking: Reported on 03/27/2024 01/31/23   Lamon Pillow, MD  gabapentin  (NEURONTIN ) 300 MG capsule TAKE 1 CAPSULE BY MOUTH THREE TIMES DAILY Patient not taking: Reported on 03/27/2024 06/16/23   Lamon Pillow, MD  hydrochlorothiazide  (HYDRODIURIL ) 25 MG tablet TAKE 1 TABLET(25 MG) BY MOUTH DAILY Patient  not taking: Reported on 03/27/2024 07/18/23   Lamon Pillow, MD  levothyroxine  (SYNTHROID ) 50 MCG tablet TAKE 1 TABLET BY MOUTH EVERY DAY Patient not taking: Reported on 03/27/2024 03/01/23   Lamon Pillow, MD  PARoxetine  (PAXIL ) 30 MG tablet TAKE 2 TABLETS(60 MG) BY MOUTH DAILY Patient not taking: Reported on 03/27/2024 07/14/23   Lamon Pillow, MD  pravastatin  (PRAVACHOL ) 40 MG tablet TAKE 1 TABLET(40 MG) BY MOUTH DAILY Patient not taking: Reported on 03/27/2024 07/18/23   Lamon Pillow, MD      VITAL SIGNS:  Blood pressure (!) 143/72, pulse 62, temperature 98.3 F (36.8 C), temperature source Oral, resp. rate 19, SpO2 96%.  PHYSICAL EXAMINATION:  Physical Exam  GENERAL:  63 y.o.-year-old patient lying in the bed with no acute distress.  EYES: Pupils equal, round, reactive to light and accommodation. No scleral icterus. Extraocular muscles intact.  HEENT: Head atraumatic, normocephalic. Oropharynx and nasopharynx clear.  NECK:  Supple, no jugular venous distention. No thyroid  enlargement, no tenderness.  LUNGS: Normal breath sounds bilaterally, no wheezing, rales,rhonchi or crepitation. No use of accessory muscles of respiration.   CARDIOVASCULAR: Regular rate and rhythm, S1, S2 normal. No murmurs, rubs, or gallops.  ABDOMEN: Soft, nondistended, nontender. Bowel sounds present. No organomegaly or mass.  EXTREMITIES: No pedal edema, cyanosis, or clubbing.  NEUROLOGIC: Cranial nerves II through XII are intact. Muscle strength 5/5 in all extremities. Sensation intact. Gait not checked.  PSYCHIATRIC: The patient is alert and oriented x 3.  Normal affect and good eye contact. SKIN: No obvious rash, lesion, or ulcer.   LABORATORY PANEL:   CBC Recent Labs  Lab 03/26/24 1533  WBC 9.2  HGB 14.6  HCT 44.8  PLT 323   ------------------------------------------------------------------------------------------------------------------  Chemistries  Recent Labs  Lab 03/26/24 1533  NA 143  K 3.6  CL 109  CO2 22  GLUCOSE 117*  BUN 10  CREATININE 0.74  CALCIUM 9.4  AST 15  ALT 15  ALKPHOS 116  BILITOT 0.9   ------------------------------------------------------------------------------------------------------------------  Cardiac Enzymes No results for input(s): "TROPONINI" in the last 168 hours. ------------------------------------------------------------------------------------------------------------------  RADIOLOGY:  MR BRAIN WO CONTRAST Result Date: 03/27/2024 CLINICAL DATA:  eval cva. bilateral leg weakness, right hand paresthesias. acute and significant ambulatory dysfunction EXAM: MRI HEAD WITHOUT CONTRAST TECHNIQUE: Multiplanar, multiecho pulse sequences of the brain and surrounding structures were obtained without intravenous contrast. COMPARISON:  CT head and MRI head 11/23/2014. FINDINGS: Brain: Acute left thalamocapsular infarcts. Mild associated edema without mass effect. No midline shift. No evidence of acute hemorrhage, mass lesion, or hydrocephalus. Vascular: Major arterial flow voids are maintained at the skull base. Skull and upper cervical spine: Normal marrow signal. Sinuses/Orbits: Clear sinuses.   No acute orbital findings. Other: No mastoid effusions. IMPRESSION: Acute left thalamocapsular infarcts. Electronically Signed   By: Stevenson Elbe M.D.   On: 03/27/2024 00:01   MR LUMBAR SPINE WO CONTRAST Result Date: 03/26/2024 CLINICAL DATA:  Evaluation for cauda equina syndrome, acute on chronic left lower back pain. Urinary incontinence. EXAM: MRI LUMBAR SPINE WITHOUT CONTRAST TECHNIQUE: Multiplanar, multisequence MR imaging of the lumbar spine was performed. No intravenous contrast was administered. COMPARISON:  Lumbar spine radiograph 10/20/2018. FINDINGS: Segmentation:  Standard. Alignment: Lumbar lordosis is maintained. Trace retrolisthesis of L5 on S1. Vertebrae: Modic type 1 degenerative endplate changes at L5-S1 with associated discogenic edema. No additional significant bone marrow edema or evidence of fracture. Vertebral body heights are maintained. No suspicious osseous lesion. Conus medullaris and cauda equina: Conus  extends to the L1-2 level. Conus and cauda equina appear normal. Paraspinal and other soft tissues: The paraspinal soft tissues are unremarkable. Disc levels: T12-L1: No significant disc bulge. No significant spinal canal stenosis or foraminal stenosis. L1-2: No significant disc bulge. No significant spinal canal stenosis or foraminal stenosis. L2-3: No significant disc bulge. No significant spinal canal stenosis or foraminal stenosis. Mild facet arthrosis. L3-4: Disc desiccation and mild disc height loss. Minimal disc bulge with lateral recess narrowing without significant impingement upon the traversing nerve roots. Mild facet arthrosis. No significant spinal canal stenosis. No significant foraminal stenosis. L4-5: Disc desiccation. Minimal disc bulge. Moderate facet arthrosis which indents the dorsal thecal sac. No significant spinal canal stenosis. No significant foraminal stenosis. L5-S1: Disc desiccation and moderate disc height loss. Diffuse disc bulge slightly eccentric to  the left with resulting in lateral recess narrowing with possible impingement upon the traversing S1 nerve roots, left greater than right. Bilateral facet arthrosis. Mild epidural lipomatosis. Mild narrowing of the thecal sac at this level. There is moderate bilateral foraminal narrowing. Disc bulge and facet osteophytes likely contact and slightly deform the exiting right L5 nerve root. IMPRESSION: Degenerative changes of the lumbar spine as above most pronounced at L5-S1. Disc bulge results in lateral recess narrowing with possible impingement upon the traversing S1 nerve roots, left greater than right. Disc bulge and facet arthrosis at L5-S1 resulting in moderate bilateral foraminal stenosis. Disc bulge and facet osteophytes likely contact and slightly deform the exiting right L5 nerve root. Degenerative endplate changes and edema at L5-S1 which may also contribute to back pain. Electronically Signed   By: Denny Flack M.D.   On: 03/26/2024 19:48      IMPRESSION AND PLAN:  Assessment and Plan: * Acute ischemic vertebrobasilar artery thalamic stroke, left [facility]) - The patient will be admitted to an observation medically monitored bed.   - We will follow neuro checks q.4 hours for 24 hours.   - The patient will be placed on aspirin and Plavix.   - Will obtain a brain MRI without contrast as well as bilateral carotid Doppler and 2D echo with bubble study .   - A neurology consultation  as well as physical/occupation/speech therapy consults will be obtained in a.m.Aaron Aas - I notified Dr. Cleone Dad about the patient  - The patient will be placed on statin therapy and fasting lipids will be checked.   Peripheral neuropathy - Is associated with sciatica and low back pain. - Will continue Neurontin  and pain management.  Essential hypertension - Will continue antihypertensive therapy with permissive hypertension.  Hypothyroidism Will continue Synthroid .  Dyslipidemia - Will continue statin  therapy.   DVT prophylaxis: Lovenox.  Advanced Care Planning:  Code Status: full code.  Family Communication:  The plan of care was discussed in details with the patient (and family). I answered all questions. The patient agreed to proceed with the above mentioned plan. Further management will depend upon hospital course. Disposition Plan: Back to previous home environment Consults called: Neurology All the records are reviewed and case discussed with ED provider.  Status is: Observation  I certify that at the time of admission, it is my clinical judgment that the patient will require hospital care extending less than 2 midnights.                            Dispo: The patient is from: Home  Anticipated d/c is to: Home              Patient currently is not medically stable to d/c.              Difficult to place patient: No  Virgene Griffin M.D on 03/27/2024 at 6:01 AM  [facility]   From 7 PM-7 AM, contact night-coverage [email or web address]  CC: Primary care physician; Jacques Mattock, PA-C

## 2024-03-27 NOTE — ED Notes (Signed)
 Pt transported to inpatient, no incident. Pt confirms she has all her belongings on her. Inpatient RN/secretary notified of Pts arrival to unit. Pt bed locked in lowest position, CB in reach.

## 2024-03-27 NOTE — Assessment & Plan Note (Signed)
-   Is associated with sciatica and low back pain. - Will continue Neurontin  and pain management.

## 2024-03-27 NOTE — Progress Notes (Signed)
 SLP Cancellation Note  Patient Details Name: Jamie Williams MRN: [id number] DOB: Sep 04, 1961   Cancelled treatment:       Reason Eval/Treat Not Completed: Patient at procedure or test/unavailable (Pt OTF for CTA. Will continue efforts as appropriate.)  Dia Forget, M.S., CCC-SLP Speech-Language Pathologist [facility] [phone] (ASCOM)  [name] 03/27/2024, 7:36 AM

## 2024-03-27 NOTE — Progress Notes (Signed)
 Occupational Therapy Evaluation Patient Details Name: Jamie Williams MRN: [id number] DOB: 11-14-1960 Today's Date: 03/27/2024   History of Present Illness   Pt is a 63 y.o. female who presented to the emergency room with acute onset of back pain which has been going on for a month for which she has been seen by Ortho and managed with Celebrex. Experiencing generalized BLE and RUE weakness with difficulty ambulating requiring support from walls and nearby furniture to get around house. PMH:  anxiety, hypertension, dyslipidemia, and GERD     Clinical Impressions Ms Camilo was seen for OT evaluation this date. Prior to hospital admission, pt was IND in ADLs. Pt lives w/ husband and son. Pt presents to acute OT demonstrating impaired ADL performance and functional mobility 2/2 weakness in BLE and decrease activity tolerance (See OT problem list for additional functional deficits). Pt currently requires SUPERVISION for LBD, SBA + RW for ~15 ft to bathroom, and MIN A for LB clothing management during toileting. Pt was was CGA + RW for ambulation back to bed due to pt reported BLE weakness and rest breaks; reported pain was 8/10, notified RN. Pt would benefit from skilled OT services to address noted impairments and functional limitations (see below for any additional details) in order to maximize safety and independence while minimizing falls risk and caregiver burden. Anticipate the need for follow up OT services upon acute hospital DC.      If plan is discharge home, recommend the following:   A little help with walking and/or transfers;A little help with bathing/dressing/bathroom;Assistance with cooking/housework;Assist for transportation     Functional Status Assessment   Patient has had a recent decline in their functional status and demonstrates the ability to make significant improvements in function in a reasonable and predictable amount of time.     Equipment Recommendations    BSC/3in1     Recommendations for Other Services         Precautions/Restrictions   Precautions Precautions: Fall Recall of Precautions/Restrictions: Intact Restrictions Weight Bearing Restrictions Per Provider Order: No     Mobility Bed Mobility Overal bed mobility: Modified Independent                  Transfers Overall transfer level: Needs assistance Equipment used: Rolling walker (2 wheels) Transfers: Sit to/from Stand             General transfer comment: SBA bed>toilet; CGA to intermittent MIN A for toilet>bed      Balance Overall balance assessment: Needs assistance Sitting-balance support: No upper extremity supported, Feet supported Sitting balance-Leahy Scale: Good     Standing balance support: Bilateral upper extremity supported, Reliant on assistive device for balance Standing balance-Leahy Scale: Fair                             ADL either performed or assessed with clinical judgement   ADL Overall ADL's : Needs assistance/impaired                                       General ADL Comments: SUPERVISION for LBD; MIN A for LB clothing management during toileting     Vision         Perception         Praxis         Pertinent Vitals/Pain Pain Assessment Pain Assessment: 0-10  Pain Score: 8  Pain Location: back/BLE Pain Descriptors / Indicators: Aching, Discomfort Pain Intervention(s): Limited activity within patient's tolerance, Monitored during session, Repositioned, Patient requesting pain meds-RN notified     Extremity/Trunk Assessment Upper Extremity Assessment Upper Extremity Assessment: Overall WFL for tasks assessed   Lower Extremity Assessment Lower Extremity Assessment: Overall WFL for tasks assessed       Communication Communication Communication: No apparent difficulties   Cognition Arousal: Alert Behavior During Therapy: WFL for tasks assessed/performed Cognition: No  apparent impairments                               Following commands: Intact       Cueing  General Comments   Cueing Techniques: Verbal cues;Gestural cues;Tactile cues  MMT 5/5 for BUE and BLE   Exercises     Shoulder Instructions      Home Living Family/patient expects to be discharged to:: Private residence Living Arrangements: Spouse/significant other;Children Available Help at Discharge: Family;Available PRN/intermittently Type of Home: House Home Access: Stairs to enter [employer] of Steps: 3 Entrance Stairs-Rails: Can reach both Home Layout: One level     Bathroom Shower/Tub: Chief Strategy Officer: Standard     Home Equipment: None          Prior Functioning/Environment Prior Level of Function : Independent/Modified Independent               ADLs Comments: IND in ADLs    OT Problem List: Decreased activity tolerance;Impaired balance (sitting and/or standing);Pain   OT Treatment/Interventions: Self-care/ADL training      OT Goals(Current goals can be found in the care plan section)   Acute Rehab OT Goals Patient Stated Goal: to go home OT Goal Formulation: With patient Time For Goal Achievement: 04/10/24 Potential to Achieve Goals: Good ADL Goals Pt Will Perform Lower Body Dressing: with modified independence;sit to/from stand Pt Will Transfer to Toilet: with modified independence;ambulating;regular height toilet Additional ADL Goal #1: Pt will recall safe use of AD in 3/3 opportunities   OT Frequency:  Min 2X/week    Co-evaluation              AM-PAC OT "6 Clicks" Daily Activity     Outcome Measure Help from another person eating meals?: None Help from another person taking care of personal grooming?: A Little Help from another person toileting, which includes using toliet, bedpan, or urinal?: A Little Help from another person bathing (including washing, rinsing, drying)?: A Little Help  from another person to put on and taking off regular upper body clothing?: None Help from another person to put on and taking off regular lower body clothing?: None 6 Click Score: 21   End of Session Equipment Utilized During Treatment: Gait belt;Rolling walker (2 wheels) Nurse Communication: Patient requests pain meds  Activity Tolerance: Patient tolerated treatment well;Patient limited by pain Patient left: in bed;with call bell/phone within reach;with bed alarm set  OT Visit Diagnosis: Unsteadiness on feet (R26.81);Other abnormalities of gait and mobility (R26.89);Muscle weakness (generalized) (M62.81);Pain                Time: 1610-9604 OT Time Calculation (min): 26 min Charges:  OT General Charges $OT Visit: 1 Visit OT Evaluation $OT Eval Low Complexity: 1 Low OT Treatments $Self Care/Home Management : 8-22 mins  Stevenson Elbe, Student OT   [employer] 03/27/2024, 10:27 AM

## 2024-03-27 NOTE — Progress Notes (Signed)
 Brief hospitalist update note.  This is a nonbillable note.  Please see same-day H&P for full billable details.  Briefly, this is a 63 year old female history of hypertension, hyperlipidemia, hypothyroid, cervical spine DJD, lumbar radiculopathy who presents with acute onset right sided tingling that started on Sunday 3 days prior to this evaluation.  Subsequently on ambulation attempt she started to experience bilateral leg weakness.  Patient endorsed back pain which resolved with medications.  Gait remained unsteady.  MRI brain revealed left thalamic infarct.  Neurology consulted.  Plan: DAPT aspirin and Plavix x 21 days followed by aspirin monotherapy indefinitely Telemetry monitoring while admitted Normotensive blood pressure goal, gradual normotension.  Out of window for permissive hypertension PT OT consults 2D echocardiogram with bubble, pending  Discussed case with neurology consultant.  Will follow-up echocardiogram.  If completed and results are reassuring patient can discharge home.  Anticipate discharge 6/4 as echocardiogram is pending at time of this note.  Margery Sheets MD  No charge

## 2024-03-27 NOTE — Progress Notes (Signed)
 SLP Cancellation Note  Patient Details Name: Jamie Williams MRN: [id number] DOB: Jun 14, 1961   Cancelled treatment:       Reason Eval/Treat Not Completed: SLP screened, no needs identified, will sign off (Pt observed while pt working with OT. Pt endorsed no changes to speech/language/cognition, and no functional deficits were appreciated.)  Dia Forget, M.S., CCC-SLP Speech-Language Pathologist [facility] [phone] [place])  [name] 03/27/2024, 9:44 AM

## 2024-03-27 NOTE — ED Notes (Signed)
Informed RN bed assigned 

## 2024-03-27 NOTE — Consult Note (Signed)
 NEUROLOGY CONSULT NOTE   Date of service: March 27, 2024 Patient Name: Jamie Williams MRN:  [id number] DOB:  01/10/1961 Chief Complaint: "leg weakness, right hand parasthesias" Requesting Provider: Tiajuana Fluke, MD  History of Present Illness  Jamie Williams is a right-handed 63 y.o. woman with hx of hypertension, hyperlipidemia, hypothyroidism, cervical spine degenerative disc disease, lumbar radiculopathy with left-sided sciatica, migraine headaches, right shoulder pain.  She reports that Sunday afternoon she had sudden onset right sided tingling of her hand while she was sitting down.  Subsequently when she went to ambulate she noticed what she feels was bilateral leg weakness but on further questioning may be inability to compensate for chronic left-sided sciatica secondary to right leg weakness.  She did not particularly appreciate any 1 leg weaker than the other.  She has noticed some mild clumsiness of the right hand, but no clear weakness otherwise.  She denies any other focal neurologic symptoms  Back pain resolved in ED with medications (lidocaine patch, ketorolac 30 mg, tylenol 1 g, zofran 4 mg, robaxin 500 mg, ativan 1mg), gait remained very unsteady with walker and right hand paraesthesias, so MRI brain was obtained which revealed a left thalamic infarct for which neurology was consulted  Denies bowel or bladder issues, reports normal sensation in the GU area  LKW:  6/1 afternoon Modified rankin score: 0-1 IV Thrombolysis: No, out of the window EVT: No, exam not consistent with LVO   NIHSS components Score: Comment  1a Level of Conscious 0[x] 1[] 2[] 3[]     1b LOC Questions 0[x] 1[] 2[]      1c LOC Commands 0[x] 1[] 2[]      2 Best Gaze 0[x] 1[] 2[]      3 Visual 0[x] 1[] 2[] 3[]     4 Facial Palsy 0[x] 1[] 2[] 3[]     5a Motor Arm - left 0[x] 1[] 2[] 3[] 4[] UN[]   5b Motor Arm - Right 0[x] 1[] 2[] 3[] 4[] UN[]   6a Motor Leg - Left 0[] 1[x] 2[] 3[] 4[] UN[]   6b  Motor Leg - Right 0[] 1[x] 2[] 3[] 4[] UN[]   7 Limb Ataxia 0[x] 1[] 2[] UN[]     8 Sensory 0[x] 1[] 2[] UN[]     9 Best Language 0[x] 1[] 2[] 3[]     10 Dysarthria 0[x] 1[] 2[] UN[]     11  Extinct. and Inattention 0[x]  1[]  2[]       TOTAL:       ROS  Comprehensive ROS performed and pertinent positives documented in HPI    Past History   Past Medical History:  Diagnosis Date   Anxiety    Family history of adverse reaction to anesthesia    "sister had a difficult time waking up from mastectomy and passed away"    GERD (gastroesophageal reflux disease)    History of chicken pox    History of kidney stones    Hyperlipidemia    Hypertension    PONV (postoperative nausea and vomiting)    nausea   Sciatic nerve pain     Past Surgical History:  Procedure Laterality Date   ANTERIOR CERVICAL DECOMP/DISCECTOMY FUSION N/A 12/24/2015   Procedure: Cervical five-six, Cervical six-seven  Anterior cervical decompression/diskectomy/fusion/interbody prosthesis/plate;  Surgeon: Garry Kansas, MD;  Location: [facility];  Service: Neurosurgery;  Laterality: N/A;   COLONOSCOPY WITH PROPOFOL  N/A 07/17/2018   Procedure: COLONOSCOPY WITH PROPOFOL ;  Surgeon: Luke Salaam, MD;  Location: [facility];  Service: Gastroenterology;  Laterality: N/A;   ECTOPIC PREGNANCY SURGERY  1991 and 1996   two Etopic preg.   Head Injuries  2006   surgery 2006 Cram/NS in [place]. Headaches with increased intracranial pressure. Repeat MRI in 2008 Negative   SUPRACERVICAL ABDOMINAL HYSTERECTOMY  2011   Abdominal; Ovaries intact; CERVIX INTACT. Fibroids/dys menorrhea. [place]   Ulnar Neuropathy Right 2004   Ulnar Neuropathy surgical revision. 2004-2008. [place].    Family History: Family History  Problem Relation Age of Onset   Hypertension Mother    Diabetes Mother        Type 2   Breast cancer Mother 39   Cancer Sister 18       Breast   Diabetes Sister        type 2   Breast cancer Sister 26    Breast cancer Sister    Diabetes Sister    Diabetes Sister    Diabetes Sister        type 2   Liver cancer Brother    Stomach cancer Brother     Social History  reports that she has been smoking cigarettes. She has never used smokeless tobacco. She reports that she does not currently use alcohol. She reports that she does not currently use drugs.  Allergies  Allergen Reactions   Celebrex [Celecoxib] Other (See Comments)    Dizziness, light-headedness   Aspirin Nausea And Vomiting   Penicillins Rash   Sulfa Antibiotics Hives    Medications   Current Facility-Administered Medications:    [START ON 03/28/2024]  stroke: early stages of recovery book, , Does not apply, Once, Mansy, Jan A, MD   0.9 %  sodium chloride  infusion, , Intravenous, Continuous, Mansy, Jan A, MD, Last Rate: 100 mL/hr at 03/27/24 0826, New Bag at 03/27/24 9604   acetaminophen  (TYLENOL ) tablet 650 mg, 650 mg, Oral, Q6H PRN, 650 mg at 03/27/24 0344 **OR** acetaminophen  (TYLENOL ) suppository 650 mg, 650 mg, Rectal, Q6H PRN, Mansy, Jan A, MD   aspirin EC tablet 81 mg, 81 mg, Oral, Daily, Mansy, Jan A, MD   atorvastatin (LIPITOR) tablet 80 mg, 80 mg, Oral, QHS, Saadia Dewitt L, MD   cholecalciferol (VITAMIN D3) 25 MCG (1000 UNIT) tablet 2,000 Units, 2,000 Units, Oral, Daily, Mansy, Jan A, MD   colchicine  tablet 0.6 mg, 0.6 mg, Oral, BID PRN, Mansy, Jan A, MD   cyclobenzaprine  (FLEXERIL ) tablet 10 mg, 10 mg, Oral, TID PRN, Mansy, Jan A, MD   enoxaparin (LOVENOX) injection 42.5 mg, 0.5 mg/kg, Subcutaneous, Q24H, Mansy, Jan A, MD, 42.5 mg at 03/27/24 0719   lidocaine  (LIDODERM ) 5 % 1 patch, 1 patch, Transdermal, Q24H, Arline Bennett, MD, 1 patch at 03/26/24 1636   lisinopril  (ZESTRIL ) tablet 5 mg, 5 mg, Oral, Daily, Mansy, Jan A, MD   magnesium hydroxide (MILK OF MAGNESIA) suspension 30 mL, 30 mL, Oral, Daily PRN, Mansy, Jan A, MD   nicotine  (NICODERM CQ  - dosed in mg/24 hr) patch 7 mg, 7 mg, Transdermal, Daily, Mansy,  Jan A, MD   ondansetron  (ZOFRAN ) tablet 4 mg, 4 mg, Oral, Q6H PRN **OR** ondansetron  (ZOFRAN ) injection 4 mg, 4 mg, Intravenous, Q6H PRN, Mansy, Jan A, MD   traZODone (DESYREL) tablet 25 mg, 25 mg, Oral, QHS PRN, Mansy, Jan A, MD  Current Outpatient Medications  Medication Instructions   albuterol  (VENTOLIN  HFA) 108 (90 Base) MCG/ACT inhaler 2 puffs, Inhalation, Every 6 hours PRN   amLODipine  (NORVASC ) 10 mg, Oral, Daily, OFFICE  VISIT NEEDED FOR ADDITIONAL REFILLS   cholecalciferol (VITAMIN D ) 2,000 Units, Oral, Daily   colchicine  0.6 MG tablet Take 2 tablets (1.2 mg) on first day of gout flare, then 1 tablet daily   cyclobenzaprine  (FLEXERIL ) 10 mg, Oral, 3 times daily PRN, Take one tab po qhs for back spasm prn only   erythromycin  ophthalmic ointment 1 Application, Left Eye, Daily at bedtime   estradiol  (ESTRACE ) 2 MG tablet TAKE 1 TABLET(2 MG) BY MOUTH DAILY   gabapentin  (NEURONTIN ) 300 MG capsule TAKE 1 CAPSULE BY MOUTH THREE TIMES DAILY   hydrochlorothiazide  (HYDRODIURIL ) 25 MG tablet TAKE 1 TABLET(25 MG) BY MOUTH DAILY   levothyroxine  (SYNTHROID ) 50 mcg, Oral, Daily   lisinopril  (ZESTRIL ) 5 mg, Oral, Daily   meloxicam  (MOBIC ) 15 MG tablet TAKE 1 TABLET(15 MG) BY MOUTH DAILY WITH FOOD   nicotine  (NICODERM CQ  - DOSED IN MG/24 HR) 7 mg/24hr patch PLACE 1 PATCH ONTO THE SKIN DAILY   PARoxetine  (PAXIL ) 30 MG tablet TAKE 2 TABLETS(60 MG) BY MOUTH DAILY   pravastatin  (PRAVACHOL ) 40 MG tablet TAKE 1 TABLET(40 MG) BY MOUTH DAILY   promethazine  (PHENERGAN ) 25 MG tablet TAKE 1 TABLET(25 MG) BY MOUTH EVERY 6 HOURS AS NEEDED FOR NAUSEA OR VOMITING     Vitals   Vitals:   03/27/24 0700 03/27/24 0710 03/27/24 0730 03/27/24 0813  BP: (!) 154/74 (!) 154/74 (!) 153/84 (!) 152/71  Pulse: 63 70 69 66  Resp:  18 17 16   Temp:   97.7 F (36.5 C) 97.9 F (36.6 C)  TempSrc:   Oral   SpO2: 99% 100% 95% 93%  Weight:        Body mass index is 30.45 kg/m.  Physical Exam   Constitutional: Appears  well-developed and well-nourished.  Psych: Affect appropriate to situation, pleasant and cooperative Eyes: No scleral injection HENT: No oropharyngeal obstruction.  No limitation in range of motion of the neck, no pain with neck movement MSK: no major joint deformities.  Cardiovascular: Normal rate and regular rhythm. Perfusing extremities well Respiratory: Effort normal, non-labored breathing GI: Soft.  No distension. There is no tenderness.  Skin: Warm dry and intact visible skin  Neurologic Examination   Mental Status: Patient is awake, alert, oriented to person, place, month, year, and situation. Patient is able to give a clear and coherent history. No signs of aphasia or neglect Cranial Nerves: II: Visual Fields are full. Pupils are equal, round, and reactive to light.   III,IV, VI: EOMI without ptosis or diploplia.  V: Facial sensation is symmetric to temperature VII: Facial movement is symmetric.  VIII: hearing is intact to voice X: Uvula elevates symmetrically XI: Shoulder shrug is symmetric. XII: tongue is midline without atrophy or fasciculations.  Motor: 5/5 strength was present in all four extremities, except 4+/5 right deltoid, 4-/5 right hip flexion, Sensory: Sensation is symmetric to light touch and temperature in the arms and legs, other than mild paresthesias in the right hand only.  She orbits the right hand and does have slowed fine finger movements in the right hand compared to the left Deep Tendon Reflexes: 2+ and symmetric in the brachioradialis and patellae.  Cerebellar: FNF and HKS are intact bilaterally (right heel-to-shin within limits of weakness) Gait:  Stooped forward posture, narrow-based gait initially corrected by PT, able to ambulate with a walker with heavy anterior lean and very cautious/slow gait, denies pain at the time of the evaluation but she has been struggling with increased back pain  Labs/Imaging/Neurodiagnostic studies  CBC:  Recent  Labs  Lab 03/26/24 1533 03/27/24 0710  WBC 9.2 8.6  HGB 14.6 14.5  HCT 44.8 43.8  MCV 88.2 86.7  PLT 323 299   Basic Metabolic Panel:  Lab Results  Component Value Date   NA 139 03/27/2024   K 4.0 03/27/2024   CO2 22 03/27/2024   GLUCOSE 115 (H) 03/27/2024   BUN 15 03/27/2024   CREATININE 0.86 03/27/2024   CALCIUM 9.6 03/27/2024   GFRNONAA >60 03/27/2024   GFRAA 76 10/06/2020   Lipid Panel:  Lab Results  Component Value Date   CHOL 213 (H) 03/27/2024   HDL 50 03/27/2024   LDLCALC 140 (H) 03/27/2024   TRIG 115 03/27/2024   CHOLHDL 4.3 03/27/2024    HgbA1c:  Lab Results  Component Value Date   HGBA1C 6.0 (H) 02/23/2024   INR  Lab Results  Component Value Date   INR 0.9 11/23/2014   APTT  Lab Results  Component Value Date   APTT 32.4 11/23/2014   CT angio Head and Neck with contrast(Personally reviewed): 1. No acute intracranial hemorrhage. 2. Left thalamic infarct, less well visualized than on the prior MRI. 3. No large vessel occlusion, hemodynamically significant stenosis, or aneurysm in the head or neck.  MRI Brain w/o (Personally reviewed): Acute left thalamocapsular infarcts.   MRI Lumbar spine w/o (Personally reviewed): Degenerative changes of the lumbar spine as above most pronounced at L5-S1. Disc bulge results in lateral recess narrowing with possible impingement upon the traversing S1 nerve roots, left greater than right.   Disc bulge and facet arthrosis at L5-S1 resulting in moderate bilateral foraminal stenosis. Disc bulge and facet osteophytes likely contact and slightly deform the exiting right L5 nerve root.   Degenerative endplate changes and edema at L5-S1 which may also contribute to back pain.   ASSESSMENT   Jamie Williams is a 63 y.o. female with a past medical history significant for hypertension, headache degenerative disc disease with left-sided sciatica and status post cervical spine decompression.   While her complaint  is of bilateral lower leg weakness, she has more hip flexion weakness on the right compared to the left and I suspect that this is causing her gait to decompensate from her chronic lumbar spine issues as well.  RECOMMENDATIONS   # Left thalamocapsular infarcts, etiology likely small vessel diease - Recent A1c meeting goal < 7% - LDL above goal at 140, start atorvastatin 80 mg nightly  - MRI brain completed as above   - CTA completed as above - Frequent neuro checks per protocol - Echocardiogram (pending) - Prophylactic therapy-Antiplatelet med: Aspirin - dose 325mg  PO or 300mg  PR, followed by 81 mg daily - Plavix 300 mg load with 75 mg daily for 21 day course - Risk factor modification, smoking cessation counseling performed as below - Telemetry monitoring - Blood pressure goal   - Gradual normotension as she is out of the permissive hypertension window - PT consult, OT consult, Speech consult, unless patient is back to baseline  - Neurology will follow-up echocardiogram otherwise we will sign off at this time, discussed with Dr. Mosetta Areola in person.  Please do not hesitate to reach out if additional questions or concerns arise  # Smoking  - Counseled on the importance of quitting smoking to reduce risk of stroke. Discussed that nicotine  withdrawal symptoms peak in the first three days of smoking cessation and subside over the next three to four weeks. Discussed options including counseling, pharmacological options including  Nicotine  patch, Chantix, Wellbutrin . We discussed quit date. I also provided the patient a handout with resources and free resources offered by the state of [place]  on "https://fox-cook.com/". Patient will also reach out to PCP on followup.  ______________________________________________________________________   Baldwin Levee MD-PhD [facility] [phone] [facility] coverage for [facility] is from 8 AM to 4 AM in-house and 4 PM to 8  PM by telephone/video. 8 PM to 8 AM emergent questions or overnight urgent questions should be addressed to Teleneurology On-call or Arlin Benes neurohospitalist; contact information can be found on AMION

## 2024-03-27 NOTE — Plan of Care (Signed)
 Alert and oriented x4. Appetite good as evidenced by 50-75% of meal consumption. Ambulates to bathroom with walker and standby assist. Up to chair today and tolerated well. Verbalizes needs. Education on stroke care provided.  Problem: Ischemic Stroke/TIA Tissue Perfusion: Goal: Complications of ischemic stroke/TIA will be minimized Outcome: Progressing   Problem: Coping: Goal: Will verbalize positive feelings about self Outcome: Progressing Goal: Will identify appropriate support needs Outcome: Progressing   Problem: Health Behavior/Discharge Planning: Goal: Ability to manage health-related needs will improve Outcome: Progressing   Problem: Self-Care: Goal: Ability to participate in self-care as condition permits will improve Outcome: Progressing Goal: Verbalization of feelings and concerns over difficulty with self-care will improve Outcome: Progressing Goal: Ability to communicate needs accurately will improve Outcome: Progressing   Problem: Nutrition: Goal: Risk of aspiration will decrease Outcome: Progressing Goal: Dietary intake will improve Outcome: Progressing

## 2024-03-28 ENCOUNTER — Other Ambulatory Visit: Payer: Self-pay

## 2024-03-28 ENCOUNTER — Observation Stay
Admit: 2024-03-28 | Discharge: 2024-03-28 | Disposition: A | Discharge status: Home / Self Care | Attending: Family Medicine | Admitting: Family Medicine

## 2024-03-28 DIAGNOSIS — I6389 Other cerebral infarction: Secondary | ICD-10-CM | POA: Diagnosis not present

## 2024-03-28 DIAGNOSIS — I739 Peripheral vascular disease, unspecified: Secondary | ICD-10-CM | POA: Diagnosis not present

## 2024-03-28 DIAGNOSIS — F17200 Nicotine dependence, unspecified, uncomplicated: Secondary | ICD-10-CM | POA: Diagnosis not present

## 2024-03-28 DIAGNOSIS — I10 Essential (primary) hypertension: Secondary | ICD-10-CM | POA: Diagnosis not present

## 2024-03-28 DIAGNOSIS — I6381 Other cerebral infarction due to occlusion or stenosis of small artery: Secondary | ICD-10-CM | POA: Diagnosis not present

## 2024-03-28 LAB — ECHOCARDIOGRAM COMPLETE BUBBLE STUDY
AR max vel: 2.61 cm2
AV Area VTI: 3.36 cm2
AV Area mean vel: 2.65 cm2
AV Mean grad: 3.5 mmHg
AV Peak grad: 6.4 mmHg
Ao pk vel: 1.27 m/s
Area-P 1/2: 2.39 cm2
MV VTI: 2.59 cm2
S' Lateral: 2.1 cm

## 2024-03-28 MED ORDER — PANTOPRAZOLE SODIUM 40 MG PO TBEC
40.0000 mg | DELAYED_RELEASE_TABLET | Freq: Every day | ORAL | 0 refills | Status: DC
  Filled 2024-03-28: qty 30, 30d supply, fill #0

## 2024-03-28 MED ORDER — ASPIRIN 81 MG PO TBEC
81.0000 mg | DELAYED_RELEASE_TABLET | Freq: Every day | ORAL | 11 refills | Status: DC
Start: 2024-03-29 — End: 2024-04-30
  Filled 2024-03-28: qty 30, 30d supply, fill #0

## 2024-03-28 MED ORDER — CLOPIDOGREL BISULFATE 75 MG PO TABS
75.0000 mg | ORAL_TABLET | Freq: Every day | ORAL | 0 refills | Status: AC
  Filled 2024-03-28: qty 20, 20d supply, fill #0

## 2024-03-28 MED ORDER — ATORVASTATIN CALCIUM 80 MG PO TABS
80.0000 mg | ORAL_TABLET | Freq: Every day | ORAL | 11 refills | Status: DC
Start: 2024-03-28 — End: 2024-04-30
  Filled 2024-03-28: qty 30, 30d supply, fill #0

## 2024-03-28 MED ORDER — HYDRALAZINE HCL 50 MG PO TABS
50.0000 mg | ORAL_TABLET | Freq: Three times a day (TID) | ORAL | 1 refills | Status: DC | PRN
  Filled 2024-03-28: qty 30, 10d supply, fill #0

## 2024-03-28 MED ORDER — LISINOPRIL 20 MG PO TABS
20.0000 mg | ORAL_TABLET | Freq: Every day | ORAL | Status: DC
  Administered 2024-03-28: 20 mg via ORAL
  Filled 2024-03-28: qty 1

## 2024-03-28 MED ORDER — LISINOPRIL 20 MG PO TABS
20.0000 mg | ORAL_TABLET | Freq: Every day | ORAL | 11 refills | Status: DC
  Filled 2024-03-28: qty 30, 30d supply, fill #0

## 2024-03-28 NOTE — Progress Notes (Signed)
..      [facility] REFERRAL        Occupational Therapy * Physical Therapy * Speech Therapy                           DATE 03/28/2024  PATIENT NAME Jamie Williams  PATIENT MRN [id number]       DIAGNOSIS/DIAGNOSIS CODE Acute ischemic vertebrobasilar artery thalamic stroke, left  DATE OF DISCHARGE: 03/28/2024       PRIMARY CARE PHYSICIAN  Jacques Mattock     PCP PHONE/FAX [phone]     Dear Provider (Name: [facility] outpatient __  Fax: [phone]   I certify that I have examined this patient and that occupational/physical/speech therapy is necessary on an outpatient basis.    The patient has expressed interest in completing their recommended course of therapy at your  location.  Once a formal order from the patient's primary care physician has been obtained, please  contact him/her to schedule an appointment for evaluation at your earliest convenience.   [ X]  Physical Therapy Evaluate and Treat  [  ]  Occupational Therapy Evaluate and Treat  [  ]  Speech Therapy Evaluate and Treat         The patient's primary care physician (listed above) must furnish and be responsible for a formal order such that the recommended services may be furnished while under the primary physician's care, and that the plan of care will be established and reviewed every 30 days (or more often if condition necessitates).

## 2024-03-28 NOTE — Progress Notes (Signed)
 Physical Therapy Treatment Patient Details Name: Jamie Williams MRN: [id number] DOB: July 06, 1961 Today's Date: 03/28/2024   History of Present Illness Pt is a 63 y.o. female who presented to the emergency room with acute onset of back pain which has been going on for a month for which she has been seen by Ortho and managed with Celebrex. Experiencing generalized BLE and RUE weakness with difficulty ambulating requiring support from walls and nearby furniture to get around house. Brain MRI 03/26/24: Acute left thalamocapsular infarcts. PMH:  anxiety, hypertension, dyslipidemia, and GERD    PT Comments  Pt received upright in recliner pleasant and motivated to participate. Pt progressing to supervision to mod-I with RW with gait and safe completion of stairs training. Gait continues to improve but remains with very slowed gait speed which does increase risk for falls. Continuing to recommend using RW. Pt reports being mod-I ambulating in room and toileting herself without NSG assist. Discussed STS variations to work on RLE strengthening. Pt left in recliner with all needs. D/c recs remain appropriate.     If plan is discharge home, recommend the following: A little help with walking and/or transfers;Assistance with cooking/housework;Assist for transportation;Help with stairs or ramp for entrance   Can travel by private vehicle        Equipment Recommendations  Rolling walker (2 wheels);BSC/3in1    Recommendations for Other Services       Precautions / Restrictions Precautions Precautions: Fall Recall of Precautions/Restrictions: Intact Restrictions Weight Bearing Restrictions Per Provider Order: No     Mobility  Bed Mobility               General bed mobility comments: NT. In recliner pre and post session. Patient Response: Cooperative  Transfers Overall transfer level: Needs assistance Equipment used: Rolling walker (2 wheels) Transfers: Sit to/from Stand Sit to Stand:  Supervision           General transfer comment: VC's for hand placement    Ambulation/Gait Ambulation/Gait assistance: Modified independent (Device/Increase time) Gait Distance (Feet): 180 Feet Assistive device: Rolling walker (2 wheels) Gait Pattern/deviations: Step-through pattern, Decreased step length - left, Decreased stance time - right, Narrow base of support Gait velocity: 10' in 24 sec = .41'/sec using RW Gait velocity interpretation: <1.31 ft/sec, indicative of household ambulator   General Gait Details: improved to mod-I with RW. Remains with very slow gait but has improved foot placement and proprioception with RLE.   Stairs Stairs: Yes Stairs assistance: Supervision Stair Management: One rail Right, One rail Left, Step to pattern, Forwards Number of Stairs: 4 General stair comments: asc/desc twice. Excellent form/technique and safety completing.   Wheelchair Mobility     Tilt Bed Tilt Bed Patient Response: Cooperative  Modified Rankin (Stroke Patients Only)       Balance Overall balance assessment: Needs assistance Sitting-balance support: No upper extremity supported, Feet supported Sitting balance-Leahy Scale: Good     Standing balance support: Bilateral upper extremity supported, Reliant on assistive device for balance Standing balance-Leahy Scale: Fair                              Hotel manager: No apparent difficulties  Cognition Arousal: Alert Behavior During Therapy: WFL for tasks assessed/performed   PT - Cognitive impairments: No apparent impairments                         Following commands: Intact  Cueing Cueing Techniques: Verbal cues  Exercises Other Exercises Other Exercises: discussed on STS for strengthening. STS adaptations to bias RLE strengthening    General Comments        Pertinent Vitals/Pain Pain Assessment Pain Assessment: No/denies pain    Home Living                           Prior Function            PT Goals (current goals can now be found in the care plan section) Acute Rehab PT Goals Patient Stated Goal: improve her mobility/strength PT Goal Formulation: With patient Time For Goal Achievement: 04/10/24 Potential to Achieve Goals: Good Progress towards PT goals: Progressing toward goals    Frequency    7X/week      PT Plan      Co-evaluation              AM-PAC PT "6 Clicks" Mobility   Outcome Measure  Help needed turning from your back to your side while in a flat bed without using bedrails?: A Little Help needed moving from lying on your back to sitting on the side of a flat bed without using bedrails?: A Little Help needed moving to and from a bed to a chair (including a wheelchair)?: A Little Help needed standing up from a chair using your arms (e.g., wheelchair or bedside chair)?: A Little Help needed to walk in hospital room?: A Little Help needed climbing 3-5 steps with a railing? : A Little 6 Click Score: 18    End of Session Equipment Utilized During Treatment: Gait belt Activity Tolerance: Patient tolerated treatment well Patient left: in chair;with call bell/phone within reach Nurse Communication: Mobility status PT Visit Diagnosis: Muscle weakness (generalized) (M62.81);Difficulty in walking, not elsewhere classified (R26.2);Other abnormalities of gait and mobility (R26.89)     Time: 8295-6213 PT Time Calculation (min) (ACUTE ONLY): 24 min  Charges:    $Gait Training: 8-22 mins $Therapeutic Activity: 8-22 mins PT General Charges $$ ACUTE PT VISIT: 1 Visit                    Marc Senior. Fairly IV, PT, DPT Physical Therapist- [facility] 03/28/2024, 11:54 AM

## 2024-03-28 NOTE — Plan of Care (Signed)

## 2024-03-28 NOTE — Progress Notes (Signed)
 NEUROLOGY CONSULT FOLLOW UP NOTE   Date of service: March 28, 2024 Patient Name: Jamie Williams MRN:  [id number] DOB:  Sep 02, 1961  Interval Hx/subjective   - Feels like her strength and gait are improving  Vitals   Vitals:   03/27/24 1932 03/27/24 2346 03/28/24 0354 03/28/24 0744  BP: (!) 144/83 (!) 163/84 (!) 157/91 (!) 157/92  Pulse: 67 60 66 73  Resp: 18 20 18 16   Temp: 98.2 F (36.8 C) 97.9 F (36.6 C) 98.3 F (36.8 C) 98.2 F (36.8 C)  TempSrc:  Oral    SpO2: 95% 98% 99% 99%  Weight:      Height:         Body mass index is 30.04 kg/m.  Physical Exam   Constitutional: Appears well-developed and well-nourished.  Psych: Affect appropriate to situation, pleasant and cooperative Eyes: No scleral injection HENT: No oropharyngeal obstruction.  MSK: no major joint deformities.  Cardiovascular: Normal rate and regular rhythm. Perfusing extremities well Respiratory: Effort normal, non-labored breathing GI: Soft.  No distension. There is no tenderness.  Skin: Warm dry and intact visible skin  Neurologic Examination   Mental Status: Patient is awake, alert, oriented to person, place, and situation.  Recalls our discussion about smoking.  Does not recall all the details of some new stroke medications but we reviewed she needs to take atorvastatin, aspirin lifelong and Plavix for 21 days.  Normal casual speech Cranial Nerves: Tracks examiner bilaterally, hearing intact to voice, face symmetric, tongue midline Motor: She has subtle 4+ to 5/5 strength in the right upper extremity in an upper motor neuron pattern.  Right lower extremity weakness is improved and she is actually 5/5 bilaterally in hip flexion today.  Finger tapping on the right hand remains slower than the left but improved from yesterday Sensory: Stable paresthesias of the right hand    Medications  Current Facility-Administered Medications:    acetaminophen  (TYLENOL ) tablet 650 mg, 650 mg, Oral, Q6H PRN,  650 mg at 03/27/24 2318 **OR** acetaminophen  (TYLENOL ) suppository 650 mg, 650 mg, Rectal, Q6H PRN, Jamie Williams, Jamie A, MD   aspirin EC tablet 81 mg, 81 mg, Oral, Daily, Jamie Williams, Jamie A, MD, 81 mg at 03/28/24 1019   atorvastatin (LIPITOR) tablet 80 mg, 80 mg, Oral, QHS, Jamie Bullen L, MD, 80 mg at 03/27/24 2226   cholecalciferol (VITAMIN D3) 25 MCG (1000 UNIT) tablet 2,000 Units, 2,000 Units, Oral, Daily, Jamie Williams, Jamie A, MD, 2,000 Units at 03/28/24 1019   [COMPLETED] clopidogrel (PLAVIX) tablet 300 mg, 300 mg, Oral, Once, 300 mg at 03/27/24 1748 **FOLLOWED BY** clopidogrel (PLAVIX) tablet 75 mg, 75 mg, Oral, Daily, Jamie Mancusi L, MD, 75 mg at 03/28/24 1019   colchicine  tablet 0.6 mg, 0.6 mg, Oral, BID PRN, Jamie Williams, Jamie A, MD   cyclobenzaprine  (FLEXERIL ) tablet 10 mg, 10 mg, Oral, TID PRN, Jamie Williams, Jamie A, MD   enoxaparin (LOVENOX) injection 40 mg, 40 mg, Subcutaneous, Q24H, Jamie Williams, Jamie Williams, RPH, 40 mg at 03/28/24 1020   lidocaine  (LIDODERM ) 5 % 1 patch, 1 patch, Transdermal, Q24H, Jamie Bennett, MD, 1 patch at 03/27/24 1745   lisinopril  (ZESTRIL ) tablet 5 mg, 5 mg, Oral, Daily, Jamie Williams, Jamie A, MD, 5 mg at 03/28/24 1019   magnesium hydroxide (MILK OF MAGNESIA) suspension 30 mL, 30 mL, Oral, Daily PRN, Jamie Williams, Jamie A, MD   morphine  (PF) 2 MG/ML injection 2 mg, 2 mg, Intravenous, Q3H PRN, Jamie Williams, Jamie B, MD   nicotine  (NICODERM CQ  - dosed in mg/24 hr) patch  7 mg, 7 mg, Transdermal, Daily, Jamie Williams, Jamie A, MD   ondansetron  (ZOFRAN ) tablet 4 mg, 4 mg, Oral, Q6H PRN **OR** ondansetron  (ZOFRAN ) injection 4 mg, 4 mg, Intravenous, Q6H PRN, Jamie Williams, Jamie A, MD   oxyCODONE  (Oxy IR/ROXICODONE ) immediate release tablet 5-10 mg, 5-10 mg, Oral, Q4H PRN, Jamie Williams, Jamie B, MD, 10 mg at 03/27/24 1023   traZODone (DESYREL) tablet 25 mg, 25 mg, Oral, QHS PRN, Jamie Williams, Jamie Kaska, MD  Labs and Diagnostic Imaging   CBC:  Recent Labs  Lab 03/26/24 1533 03/27/24 0710  WBC 9.2 8.6  HGB 14.6 14.5  HCT 44.8 43.8  MCV 88.2 86.7  PLT 323  299    Basic Metabolic Panel:  Lab Results  Component Value Date   NA 139 03/27/2024   K 4.0 03/27/2024   CO2 22 03/27/2024   GLUCOSE 115 (H) 03/27/2024   BUN 15 03/27/2024   CREATININE 0.86 03/27/2024   CALCIUM 9.6 03/27/2024   GFRNONAA >60 03/27/2024   GFRAA 76 10/06/2020   Lipid Panel:  Lab Results  Component Value Date   CHOL 213 (H) 03/27/2024   HDL 50 03/27/2024   LDLCALC 140 (H) 03/27/2024   TRIG 115 03/27/2024   CHOLHDL 4.3 03/27/2024     HgbA1c:  Lab Results  Component Value Date   HGBA1C 6.0 (H) 02/23/2024    INR  Lab Results  Component Value Date   INR 0.9 11/23/2014   APTT  Lab Results  Component Value Date   APTT 32.4 11/23/2014    CT angio Head and Neck with contrast(Personally reviewed): 1. No acute intracranial hemorrhage. 2. Left thalamic infarct, less well visualized than on the prior MRI. 3. No large vessel occlusion, hemodynamically significant stenosis, or aneurysm in the head or neck.   MRI Brain w/o (Personally reviewed): Acute left thalamocapsular infarcts.    MRI Lumbar spine w/o (Personally reviewed): Degenerative changes of the lumbar spine as above most pronounced at L5-S1. Disc bulge results in lateral recess narrowing with possible impingement upon the traversing S1 nerve roots, left greater than right.   Disc bulge and facet arthrosis at L5-S1 resulting in moderate bilateral foraminal stenosis. Disc bulge and facet osteophytes likely contact and slightly deform the exiting right L5 nerve root.   Degenerative endplate changes and edema at L5-S1 which may also contribute to back pain.  ECHO  1. Left ventricular ejection fraction, by estimation, is 60 to 65%. The left ventricle has normal function. The left ventricle has no regional wall motion abnormalities. There is mild left ventricular hypertrophy. Left ventricular diastolic parameters  were normal.  2. Right ventricular systolic function is normal. The right  ventricular size is normal.  3. The mitral valve is normal in structure. No evidence of mitral valve regurgitation.  4. The aortic valve was not well visualized. Aortic valve regurgitation is not visualized.  5. The inferior vena cava is normal in size with greater than 50% respiratory variability, suggesting right atrial pressure of 3 mmHg.  6. Agitated saline contrast bubble study was negative, with no evidence of any interatrial shunt. [Normal biatrial sizes]  Assessment   MIRISSA LOPRESTI is Williams 63 y.o. female with Williams past medical history significant for hypertension, headache degenerative disc disease with left-sided sciatica and status post cervical spine decompression.    While her complaint is of bilateral lower leg weakness, she has more hip flexion weakness on the right compared to the left and I suspect that this is causing her gait to  decompensate from her chronic lumbar spine issues as well.  Fortunately her exam is improved today and she seems to be recovering well from her stroke  Recommendations  # Left thalamocapsular infarcts, etiology likely small vessel diease - Recent A1c meeting goal < 7% - LDL above goal at 140, continue atorvastatin 80 mg nightly  - Lifelong aspirin 81 mg daily - Plavix 300 mg load with 75 mg daily for 21 day course - Risk factor modification, smoking cessation counseling discussed again briefly and she is motivated to quit - Telemetry monitoring - Blood pressure goal              - Gradual normotension as she is out of the permissive hypertension window - Neurology will sign off at this time, discussed with Dr. Hubert Williams via secure chat.  Please do not hesitate to reach out if additional questions or concerns arise ____________________________________________________________________   Jamie Kling MD-PhD [facility] [phone] [facility] coverage for [facility] is from 8 AM to 4 AM in-house and 4 PM to 8 PM by  telephone/video. 8 PM to 8 AM emergent questions or overnight urgent questions should be addressed to Teleneurology On-call or Jamie Williams neurohospitalist; contact information can be found on AMION

## 2024-03-28 NOTE — Progress Notes (Signed)
*  PRELIMINARY RESULTS* Echocardiogram 2D Echocardiogram has been performed.  Broadus Canes 03/28/2024, 9:52 AM

## 2024-03-28 NOTE — TOC Progression Note (Addendum)
 Transition of Care [facility]) - Progression Note    Patient Details  Name: Jamie Williams MRN: [id number] Date of Birth: July 07, 1961  [facility] of [facility] CM/SW Contact  Elsie Halo, RN Phone Number: 03/28/2024, 9:01 AM  Clinical Narrative:     Therapy is recommending outpatient Rehab and DME RW and 3 in 1 BSC. Referral for outpatient rehab sent to [facility] outpatient Rehab. Referral for DME RW and 3 in 1 BSC called to [employer], Sam Creighton accepted the referral.  11:10 AM: TOC received call from Jon with [employer], the patient will need to pay $140+ before they will deliver the equipment. TOC spoke with the patient in the room and she is unaware of a a balance. She will purchase the equipment independently upon discharge.  Expected Discharge Plan: Home/Self Care Barriers to Discharge: Continued Medical Work up  Expected Discharge Plan and Services   Discharge Planning Services: CM Consult   Living arrangements for the past 2 months: Single Family Home                                       Social Determinants of Health (SDOH) Interventions SDOH Screenings   Food Insecurity: No Food Insecurity (03/27/2024)  Housing: Low Risk  (03/27/2024)  Transportation Needs: No Transportation Needs (03/27/2024)  Utilities: Not At Risk (03/27/2024)  Alcohol Screen: Low Risk  (08/24/2023)  Depression (PHQ2-9): Low Risk  (02/06/2024)  Recent Concern: Depression (PHQ2-9) - Medium Risk (01/20/2024)  Financial Resource Strain: Low Risk  (08/24/2023)  Physical Activity: Insufficiently Active (08/24/2023)  Social Connections: Moderately Isolated (08/24/2023)  Stress: Stress Concern Present (08/24/2023)  Tobacco Use: High Risk (03/26/2024)  Health Literacy: Adequate Health Literacy (08/24/2023)    Readmission Risk Interventions     No data to display

## 2024-03-28 NOTE — Discharge Summary (Signed)
 [facility] Discharge Summary   Patient: Jamie Williams ZOX:[id number]  PCP: Jacques Mattock, PA-C  Date of admission: 03/26/2024   Date of discharge: 03/28/2024      Discharge Diagnoses:  Principal Problem:   Acute ischemic vertebrobasilar artery thalamic stroke, left [facility]) Active Problems:   Peripheral neuropathy   Essential hypertension   Hypothyroidism   Dyslipidemia   Ambulatory dysfunction   Admitted From: Home Disposition:  Home   Recommendations for Outpatient Follow-up:  Follow-up with PCP in 1 week, continue monitor BP at home and follow with the PCP to titrate medications accordingly.  Increase dose of lisinopril  from 5 to 20 mg p.o. daily.  Started hydralazine as needed if systolic BP greater than 150 mmHg. Follow-up with neurology in 1 to 2 weeks as an outpatient Follow up LABS/TEST: Monitor BP   Follow-up Information     McDonough, Lillie Reining, PA-C. Call on 04/03/2024.   Specialty: Physician Assistant Why: @3 :40pm Contact information: [address] [place] [address] [phone]                Diet recommendation: Cardiac diet  Activity: The patient is advised to gradually reintroduce usual activities, as tolerated  Discharge Condition: stable  Code Status: Full code   History of present illness: As per the H and P dictated on admission.  Hospital Course:  Jamie Williams is a 63 y.o. African-American female with medical history significant for anxiety, hypertension, dyslipidemia, and GERD, who presented to the emergency room with acute onset of back pain which has been going on for a month for which she has been seen by Ortho and managed with Celebrex.  She reported taking 1 dose yesterday evening and shortly after that he experienced generalized bilateral lower extremity weakness with difficulty ambulating requiring support from walls and nearby furniture to get around in his house.  She usually ambulates without assistance.  She denies  any falls or injuries.  She has a scheduled outpatient LS-spine MRI given his left-sided back pain and left sciatica.  No saddle anesthesia.  She had nausea without vomiting.  No fever or chills.  No chest pain or palpitations.  No cough or wheezing or dyspnea.  No dysuria, oliguria or hematuria or flank pain.  Not any tinnitus or vertigo.  She admits to left-sided headache earlier with dizziness or blurred vision.   ED Course: When he came to the ER, BP was 181/105 with otherwise normal vital signs.  Labs revealed unremarkable CMP and CBC. EKG as reviewed by me : Pending. Imaging: [facility] MRI revealed acute left thalamic capsular infarcts.   The patient was given 1 g of p.o. Tylenol , 30 mg of IM Toradol , Lidoderm  5% patch, Ativan 1 mg p.o., Robaxin 500 mg p.o. and 4 mg of I p.o. Zofran .  He will be admitted to a medically monitored observation bed for further evaluation and management.    Assessment and Plan:   # Acute ischemic vertebrobasilar artery thalamic stroke, left MRI brain: Acute left thalamocapsular infarcts.  MRI lumbar spine: Degenerative changes of the lumbar spine as above most pronounced at L5-S1. Disc bulge results in lateral recess narrowing with possible impingement upon the traversing S1 nerve roots, left greater than right. Disc bulge and facet arthrosis at L5-S1 resulting in moderate bilateral foraminal stenosis. Disc bulge and facet osteophytes likely contact and slightly deform the exiting right L5 nerve root. Degenerative endplate changes and edema at L5-S1 which may also contribute to back pain TTE: LVEF 60-65%,  mild LV hypertrophy.  Negative PFO, no any other significant findings. LDL 140 above goal <70 Neurology consulted, patient was loaded with Plavix 300 mg one-time dose followed by 75 mg p.o. daily for 21 days.  Neuro recommended DAPT for 3 weeks followed by aspirin and started Lipitor 80 mg p.o. daily.  Continue aspirin 81 mg p.o. daily for lifelong PT and OT  eval done, recommended outpatient physical therapy.  Which was arranged by [facility]    # Peripheral neuropathy: Is associated with sciatica and low back pain. continued Neurontin  and pain management.   # Essential hypertension: Patient was not taking amlodipine  or hydrochlorothiazide .  Increased lisinopril  20 mg p.o. daily Patient was advised to monitor BP at home and follow with PCP Prescribed hydralazine as needed   # Hypothyroidism: continue Synthroid .   # Dyslipidemia: Started Lipitor 80 mg p.o. daily.   Body mass index is 30.04 kg/m.  Nutrition Interventions:  - Patient was instructed, not to drive, operate heavy machinery, perform activities at heights, swimming or participation in water activities or provide baby sitting services while on Pain, Sleep and Anxiety Medications; until her outpatient Physician has advised to do so again.  - Also recommended to not to take more than prescribed Pain, Sleep and Anxiety Medications.  Patient was seen by physical therapy, who recommended outpatient physical therapy, which was arranged. On the day of the discharge the patient's vitals were stable, and no other acute medical condition were reported by patient. the patient was felt safe to be discharge at Home with outpatient PT.  Consultants: Neurology Procedures: None  Discharge Exam: General: Appear in no distress, no Rash; Oral Mucosa Clear, moist. Cardiovascular: S1 and S2 Present, no Murmur, Respiratory: normal respiratory effort, Bilateral Air entry present and no Crackles, no wheezes Abdomen: Bowel Sound present, Soft and no tenderness, no hernia Extremities: no Pedal edema, no calf tenderness Neurology: Residual weakness on the right side, power 4/5, right hand numbness.  No any other focal deficits. affect appropriate.  Filed Weights   03/27/24 0629 03/27/24 0900  Weight: 83 kg 81.9 kg   Vitals:   03/28/24 0744 03/28/24 1205  BP: (!) 157/92 (!) 160/92  Pulse: 73 64  Resp: 16  16  Temp: 98.2 F (36.8 C) 98.1 F (36.7 C)  SpO2: 99% 95%    DISCHARGE MEDICATION: Allergies as of 03/28/2024       Reactions   Celebrex [celecoxib] Other (See Comments)   Dizziness, light-headedness   Aspirin Nausea And Vomiting   Penicillins Rash   Sulfa Antibiotics Hives        Medication List     STOP taking these medications    albuterol  108 (90 Base) MCG/ACT inhaler Commonly known as: VENTOLIN  HFA   amLODipine  10 MG tablet Commonly known as: NORVASC    estradiol  2 MG tablet Commonly known as: ESTRACE    gabapentin  300 MG capsule Commonly known as: NEURONTIN    hydrochlorothiazide  25 MG tablet Commonly known as: HYDRODIURIL    levothyroxine  50 MCG tablet Commonly known as: SYNTHROID    meloxicam  15 MG tablet Commonly known as: MOBIC    PARoxetine  30 MG tablet Commonly known as: PAXIL    pravastatin  40 MG tablet Commonly known as: PRAVACHOL        TAKE these medications    aspirin EC 81 MG tablet Take 1 tablet (81 mg total) by mouth daily. Swallow whole.   atorvastatin 80 MG tablet Commonly known as: LIPITOR Take 1 tablet (80 mg total) by mouth at bedtime.   cholecalciferol  1000 units tablet Commonly known as: VITAMIN D  Take 2,000 Units by mouth daily.   clopidogrel 75 MG tablet Commonly known as: PLAVIX Take 1 tablet (75 mg total) by mouth daily for 20 days.   colchicine  0.6 MG tablet Take 2 tablets (1.2 mg) on first day of gout flare, then 1 tablet daily   cyclobenzaprine  10 MG tablet Commonly known as: FLEXERIL  Take 1 tablet (10 mg total) by mouth 3 (three) times daily as needed for muscle spasms. Take one tab po qhs for back spasm prn only   erythromycin  ophthalmic ointment Place 1 Application into the left eye at bedtime.   hydrALAZINE 50 MG tablet Commonly known as: APRESOLINE Take 1 tablet (50 mg total) by mouth 3 (three) times daily as needed (If systolic BP greater than 150 mmHg.). If systolic BP greater than 150 mmHg.    lisinopril  20 MG tablet Commonly known as: ZESTRIL  Take 1 tablet (20 mg total) by mouth daily. What changed:  medication strength how much to take   nicotine  7 mg/24hr patch Commonly known as: NICODERM CQ  - dosed in mg/24 hr PLACE 1 PATCH ONTO THE SKIN DAILY   pantoprazole 40 MG tablet Commonly known as: Protonix Take 1 tablet (40 mg total) by mouth daily.   promethazine  25 MG tablet Commonly known as: PHENERGAN  TAKE 1 TABLET(25 MG) BY MOUTH EVERY 6 HOURS AS NEEDED FOR NAUSEA OR VOMITING               Durable Medical Equipment  (From admission, onward)           Start     Ordered   03/28/24 0000  For home use only DME 4 wheeled rolling walker with seat       Question:  Patient needs a walker to treat with the following condition  Answer:  Age-related physical debility   03/28/24 0859   03/28/24 0000  For home use only DME 3 n 1        03/28/24 0900           Allergies  Allergen Reactions   Celebrex [Celecoxib] Other (See Comments)    Dizziness, light-headedness   Aspirin Nausea And Vomiting   Penicillins Rash   Sulfa Antibiotics Hives   Discharge Instructions     Ambulatory referral to Neurology   Complete by: As directed    An appointment is requested in approximately: 1 week   Call MD for:   Complete by: As directed    Weakness or numbness, any new neurological symptoms.   Call MD for:  difficulty breathing, headache or visual disturbances   Complete by: As directed    Call MD for:  extreme fatigue   Complete by: As directed    Call MD for:  persistant dizziness or light-headedness   Complete by: As directed    Call MD for:  persistant nausea and vomiting   Complete by: As directed    Call MD for:  severe uncontrolled pain   Complete by: As directed    Call MD for:  temperature >100.4   Complete by: As directed    Diet - low sodium heart healthy   Complete by: As directed    Discharge instructions   Complete by: As directed     Follow-up with PCP in 1 week, continue monitor BP at home and follow with the PCP to titrate medications accordingly.  Increase dose of lisinopril  from 5 to 20 mg p.o. daily.  Started hydralazine as  needed if systolic BP greater than 150 mmHg. Follow-up with neurology in 1 to 2 weeks as an outpatient   For home use only DME 3 n 1   Complete by: As directed    For home use only DME 4 wheeled rolling walker with seat   Complete by: As directed    Patient needs a walker to treat with the following condition: Age-related physical debility   Increase activity slowly   Complete by: As directed        The results of significant diagnostics from this hospitalization (including imaging, microbiology, ancillary and laboratory) are listed below for reference.    Significant Diagnostic Studies: ECHOCARDIOGRAM COMPLETE BUBBLE STUDY Result Date: 03/28/2024    ECHOCARDIOGRAM REPORT   Patient Name:   MARSHAY SLATES Date of Exam: 03/28/2024 Medical Rec #:  [id number]       Height:       65.0 in Accession #:    [id number]      Weight:       180.5 lb Date of Birth:  Mar 12, 1961       BSA:          1.894 m Patient Age:    63 years        BP:           157/92 mmHg Patient Gender: F               HR:           73 bpm. Exam Location:  [facility] Procedure: 2D Echo, Cardiac Doppler, Color Doppler and Saline Contrast Bubble            Study (Both Spectral and Color Flow Doppler were utilized during            procedure). Indications:     Stroke 434.91 / I63.9  History:         Patient has no prior history of Echocardiogram examinations.                  Risk Factors:Hypertension and Dyslipidemia. Anxiety.  Sonographer:     Broadus Canes Referring Phys:  [id number] JAN A MANSY Diagnosing Phys: Lida Reeks Alluri IMPRESSIONS  1. Left ventricular ejection fraction, by estimation, is 60 to 65%. The left ventricle has normal function. The left ventricle has no regional wall motion abnormalities. There is mild left ventricular hypertrophy. Left  ventricular diastolic parameters were normal.  2. Right ventricular systolic function is normal. The right ventricular size is normal.  3. The mitral valve is normal in structure. No evidence of mitral valve regurgitation.  4. The aortic valve was not well visualized. Aortic valve regurgitation is not visualized.  5. The inferior vena cava is normal in size with greater than 50% respiratory variability, suggesting right atrial pressure of 3 mmHg.  6. Agitated saline contrast bubble study was negative, with no evidence of any interatrial shunt. FINDINGS  Left Ventricle: Left ventricular ejection fraction, by estimation, is 60 to 65%. The left ventricle has normal function. The left ventricle has no regional wall motion abnormalities. The left ventricular internal cavity size was normal in size. There is  mild left ventricular hypertrophy. Left ventricular diastolic parameters were normal. Right Ventricle: The right ventricular size is normal. No increase in right ventricular wall thickness. Right ventricular systolic function is normal. Left Atrium: Left atrial size was normal in size. Right Atrium: Right atrial size was normal in size. Pericardium: There is no evidence of pericardial effusion. Mitral Valve:  The mitral valve is normal in structure. No evidence of mitral valve regurgitation. MV peak gradient, 5.5 mmHg. The mean mitral valve gradient is 2.0 mmHg. Tricuspid Valve: The tricuspid valve is not well visualized. Tricuspid valve regurgitation is trivial. Aortic Valve: The aortic valve was not well visualized. Aortic valve regurgitation is not visualized. Aortic valve mean gradient measures 3.5 mmHg. Aortic valve peak gradient measures 6.4 mmHg. Aortic valve area, by VTI measures 3.36 cm. Pulmonic Valve: The pulmonic valve was not well visualized. Pulmonic valve regurgitation is not visualized. Aorta: The aortic root is normal in size and structure. Venous: The inferior vena cava is normal in size with  greater than 50% respiratory variability, suggesting right atrial pressure of 3 mmHg. IAS/Shunts: The atrial septum is grossly normal. Agitated saline contrast was given intravenously to evaluate for intracardiac shunting. Agitated saline contrast bubble study was negative, with no evidence of any interatrial shunt.  LEFT VENTRICLE PLAX 2D LVIDd:         3.10 cm   Diastology LVIDs:         2.10 cm   LV e' medial:    8.49 cm/s LV PW:         0.90 cm   LV E/e' medial:  9.4 LV IVS:        1.50 cm   LV e' lateral:   9.03 cm/s LVOT diam:     2.00 cm   LV E/e' lateral: 8.9 LV SV:         78 LV SV Index:   41 LVOT Area:     3.14 cm  RIGHT VENTRICLE RV Basal diam:  3.00 cm RV Mid diam:    1.90 cm RV S prime:     13.90 cm/s TAPSE (M-mode): 2.2 cm LEFT ATRIUM           Index        RIGHT ATRIUM           Index LA diam:      2.00 cm 1.06 cm/m   RA Area:     11.60 cm LA Vol (A2C): 23.9 ml 12.62 ml/m  RA Volume:   30.10 ml  15.89 ml/m LA Vol (A4C): 25.2 ml 13.31 ml/m  AORTIC VALVE AV Area (Vmax):    2.61 cm AV Area (Vmean):   2.65 cm AV Area (VTI):     3.36 cm AV Vmax:           126.50 cm/s AV Vmean:          87.200 cm/s AV VTI:            0.233 m AV Peak Grad:      6.4 mmHg AV Mean Grad:      3.5 mmHg LVOT Vmax:         105.00 cm/s LVOT Vmean:        73.500 cm/s LVOT VTI:          0.249 m LVOT/AV VTI ratio: 1.07  AORTA Ao Root diam: 3.20 cm MITRAL VALVE                TRICUSPID VALVE MV Area (PHT): 2.39 cm     TR Peak grad:   13.8 mmHg MV Area VTI:   2.59 cm     TR Vmax:        186.00 cm/s MV Peak grad:  5.5 mmHg MV Mean grad:  2.0 mmHg     SHUNTS MV Vmax:  1.17 m/s     Systemic VTI:  0.25 m MV Vmean:      57.2 cm/s    Systemic Diam: 2.00 cm MV Decel Time: 318 msec MV E velocity: 80.20 cm/s MV A velocity: 112.00 cm/s MV E/A ratio:  0.72 Joetta Mustache Electronically signed by Joetta Mustache Signature Date/Time: 03/28/2024/1:04:17 PM    Final    CT ANGIO HEAD NECK W WO CM Result Date: 03/27/2024 EXAM: CT HEAD  WITHOUT CTA HEAD AND NECK WITH AND WITHOUT 03/27/2024 07:46:37 AM TECHNIQUE: CTA of the head and neck was performed with and without the administration of intravenous contrast. Noncontrast CT of the head with reconstructed 2-D images are also provided for review. Multiplanar 2D and/or 3D reformatted images are provided for review. Automated exposure control, iterative reconstruction, and/or weight based adjustment of the mA/kV was utilized to reduce the radiation dose to as low as reasonably achievable. COMPARISON: MRI of the head 03/26/2024. CLINICAL HISTORY: Stroke/TIA, assess for intracardiac shunt. FINDINGS: CT HEAD: BRAIN AND VENTRICLES: The left thalamic infarct is less well visualized than on the MRI. No other acute intracranial abnormality is present. Suboccipital craniotomy is noted. ORBITS: No acute abnormality. SINUSES: No acute abnormality. SOFT TISSUES AND SKULL: No acute abnormality. CTA NECK: AORTIC ARCH AND ARCH VESSELS: Minimal atherosclerotic changes are present at the aortic arch. The great vessels are widely patent. CERVICAL CAROTID ARTERIES: Minimal atherosclerotic changes are present within the cavernous internal carotid arteries bilaterally. No significant stenosis is present through the ICA termini. CERVICAL VERTEBRAL ARTERIES: No dissection, arterial injury, or significant stenosis. VISUALIZED LUNGS AND MEDIASTINUM: Unremarkable. SOFT TISSUES: No acute abnormality. BONES: Cervical fusion is present at C5-6 and C6-7. Straightening of the normal cervical lordosis is present. No focal osseous lesions are present. CTA HEAD: ANTERIOR CIRCULATION: No significant stenosis of the internal carotid arteries. No significant stenosis of the anterior cerebral arteries. No significant stenosis of the middle cerebral arteries. No aneurysm. POSTERIOR CIRCULATION: No significant stenosis of the posterior cerebral arteries. No significant stenosis of the basilar artery. No significant stenosis of the  vertebral arteries. No aneurysm. OTHER: No dural venous sinus thrombosis on this non-dedicated study. IMPRESSION: 1. No acute intracranial hemorrhage. 2. Left thalamic infarct, less well visualized than on the prior MRI. 3. No large vessel occlusion, hemodynamically significant stenosis, or aneurysm in the head or neck. Electronically signed by: Audree Leas MD 03/27/2024 08:03 AM EDT RP Workstation: [id number]   MR BRAIN WO CONTRAST Result Date: 03/27/2024 CLINICAL DATA:  eval cva. bilateral leg weakness, right hand paresthesias. acute and significant ambulatory dysfunction EXAM: MRI HEAD WITHOUT CONTRAST TECHNIQUE: Multiplanar, multiecho pulse sequences of the brain and surrounding structures were obtained without intravenous contrast. COMPARISON:  CT head and MRI head 11/23/2014. FINDINGS: Brain: Acute left thalamocapsular infarcts. Mild associated edema without mass effect. No midline shift. No evidence of acute hemorrhage, mass lesion, or hydrocephalus. Vascular: Major arterial flow voids are maintained at the skull base. Skull and upper cervical spine: Normal marrow signal. Sinuses/Orbits: Clear sinuses.  No acute orbital findings. Other: No mastoid effusions. IMPRESSION: Acute left thalamocapsular infarcts. Electronically Signed   By: Stevenson Elbe M.D.   On: 03/27/2024 00:01   MR LUMBAR SPINE WO CONTRAST Result Date: 03/26/2024 CLINICAL DATA:  Evaluation for cauda equina syndrome, acute on chronic left lower back pain. Urinary incontinence. EXAM: MRI LUMBAR SPINE WITHOUT CONTRAST TECHNIQUE: Multiplanar, multisequence MR imaging of the lumbar spine was performed. No intravenous contrast was administered. COMPARISON:  Lumbar spine radiograph 10/20/2018. FINDINGS: Segmentation:  Standard. Alignment: Lumbar lordosis is maintained. Trace retrolisthesis of L5 on S1. Vertebrae: Modic type 1 degenerative endplate changes at L5-S1 with associated discogenic edema. No additional significant bone marrow  edema or evidence of fracture. Vertebral body heights are maintained. No suspicious osseous lesion. Conus medullaris and cauda equina: Conus extends to the L1-2 level. Conus and cauda equina appear normal. Paraspinal and other soft tissues: The paraspinal soft tissues are unremarkable. Disc levels: T12-L1: No significant disc bulge. No significant spinal canal stenosis or foraminal stenosis. L1-2: No significant disc bulge. No significant spinal canal stenosis or foraminal stenosis. L2-3: No significant disc bulge. No significant spinal canal stenosis or foraminal stenosis. Mild facet arthrosis. L3-4: Disc desiccation and mild disc height loss. Minimal disc bulge with lateral recess narrowing without significant impingement upon the traversing nerve roots. Mild facet arthrosis. No significant spinal canal stenosis. No significant foraminal stenosis. L4-5: Disc desiccation. Minimal disc bulge. Moderate facet arthrosis which indents the dorsal thecal sac. No significant spinal canal stenosis. No significant foraminal stenosis. L5-S1: Disc desiccation and moderate disc height loss. Diffuse disc bulge slightly eccentric to the left with resulting in lateral recess narrowing with possible impingement upon the traversing S1 nerve roots, left greater than right. Bilateral facet arthrosis. Mild epidural lipomatosis. Mild narrowing of the thecal sac at this level. There is moderate bilateral foraminal narrowing. Disc bulge and facet osteophytes likely contact and slightly deform the exiting right L5 nerve root. IMPRESSION: Degenerative changes of the lumbar spine as above most pronounced at L5-S1. Disc bulge results in lateral recess narrowing with possible impingement upon the traversing S1 nerve roots, left greater than right. Disc bulge and facet arthrosis at L5-S1 resulting in moderate bilateral foraminal stenosis. Disc bulge and facet osteophytes likely contact and slightly deform the exiting right L5 nerve root.  Degenerative endplate changes and edema at L5-S1 which may also contribute to back pain. Electronically Signed   By: Denny Flack M.D.   On: 03/26/2024 19:48    Microbiology: No results found for this or any previous visit (from the past 240 hours).   Labs: CBC: Recent Labs  Lab 03/26/24 1533 03/27/24 0710  WBC 9.2 8.6  HGB 14.6 14.5  HCT 44.8 43.8  MCV 88.2 86.7  PLT 323 299   Basic Metabolic Panel: Recent Labs  Lab 03/26/24 1533 03/27/24 0710  NA 143 139  K 3.6 4.0  CL 109 106  CO2 22 22  GLUCOSE 117* 115*  BUN 10 15  CREATININE 0.74 0.86  CALCIUM 9.4 9.6   Liver Function Tests: Recent Labs  Lab 03/26/24 1533  AST 15  ALT 15  ALKPHOS 116  BILITOT 0.9  PROT 7.5  ALBUMIN 4.1   No results for input(s): "LIPASE", "AMYLASE" in the last 168 hours. No results for input(s): "AMMONIA" in the last 168 hours. Cardiac Enzymes: No results for input(s): "CKTOTAL", "CKMB", "CKMBINDEX", "TROPONINI" in the last 168 hours. BNP (last 3 results) No results for input(s): "BNP" in the last 8760 hours. CBG: Recent Labs  Lab 03/26/24 1546  GLUCAP 126*    Time spent: 35 minutes  Signed:  Althia Atlas  [facility] 03/28/2024  2:42 PM

## 2024-04-03 ENCOUNTER — Encounter: Payer: Self-pay | Admitting: Nurse Practitioner

## 2024-04-03 ENCOUNTER — Ambulatory Visit: Admitting: Nurse Practitioner

## 2024-04-03 VITALS — BP 168/98 | HR 87 | Temp 97.2°F | Resp 16 | Ht 65.0 in | Wt 173.0 lb

## 2024-04-03 DIAGNOSIS — R29898 Other symptoms and signs involving the musculoskeletal system: Secondary | ICD-10-CM

## 2024-04-03 DIAGNOSIS — N951 Menopausal and female climacteric states: Secondary | ICD-10-CM

## 2024-04-03 DIAGNOSIS — I10 Essential (primary) hypertension: Secondary | ICD-10-CM

## 2024-04-03 DIAGNOSIS — F064 Anxiety disorder due to known physiological condition: Secondary | ICD-10-CM

## 2024-04-03 DIAGNOSIS — Z1231 Encounter for screening mammogram for malignant neoplasm of breast: Secondary | ICD-10-CM

## 2024-04-03 DIAGNOSIS — K219 Gastro-esophageal reflux disease without esophagitis: Secondary | ICD-10-CM

## 2024-04-03 DIAGNOSIS — I6930 Unspecified sequelae of cerebral infarction: Secondary | ICD-10-CM | POA: Diagnosis not present

## 2024-04-03 DIAGNOSIS — R2689 Other abnormalities of gait and mobility: Secondary | ICD-10-CM | POA: Diagnosis not present

## 2024-04-03 DIAGNOSIS — Z09 Encounter for follow-up examination after completed treatment for conditions other than malignant neoplasm: Secondary | ICD-10-CM

## 2024-04-03 DIAGNOSIS — M4722 Other spondylosis with radiculopathy, cervical region: Secondary | ICD-10-CM

## 2024-04-03 DIAGNOSIS — Z8673 Personal history of transient ischemic attack (TIA), and cerebral infarction without residual deficits: Secondary | ICD-10-CM

## 2024-04-03 MED ORDER — ESCITALOPRAM OXALATE 10 MG PO TABS
10.0000 mg | ORAL_TABLET | Freq: Every day | ORAL | 5 refills | Status: DC
Start: 2024-04-03 — End: 2024-06-15

## 2024-04-03 MED ORDER — TRAMADOL HCL 50 MG PO TABS: 50.0000 mg | ORAL_TABLET | Freq: Four times a day (QID) | ORAL | 0 refills | Status: AC | PRN

## 2024-04-03 MED ORDER — LISINOPRIL 40 MG PO TABS
40.0000 mg | ORAL_TABLET | Freq: Every day | ORAL | 2 refills | Status: DC
Start: 2024-04-03 — End: 2024-04-30

## 2024-04-03 MED ORDER — PANTOPRAZOLE SODIUM 40 MG PO TBEC
40.0000 mg | DELAYED_RELEASE_TABLET | Freq: Every day | ORAL | 11 refills | Status: DC
Start: 2024-04-03 — End: 2024-05-09

## 2024-04-03 NOTE — Progress Notes (Signed)
 [facility] Jamie Williams, [place] [address] [place] Williams [phone]                                   Transitional [facility]   [facility] Discharge Acute Issues Care Follow Up                                                                        Patient Demographics  Jamie Williams, is a 63 y.o. female  DOB [birth year]  MRN [id number].  Primary MD  McDonough, Lillie Reining, PA-C  Admit date: 03/26/2024 Discharge date: 03/28/2024  Reason for TCC follow Up - recent stroke with residual weakness of the right upepr extremity and balance issues.    Past Medical History:  Diagnosis Date   Anxiety    Family history of adverse reaction to anesthesia    sister had a difficult time waking up from mastectomy and passed away    GERD (gastroesophageal reflux disease)    History of chicken pox    History of kidney stones    Hyperlipidemia    Hypertension    PONV (postoperative nausea and vomiting)    nausea   Sciatic nerve pain     Past Surgical History:  Procedure Laterality Date   ANTERIOR CERVICAL DECOMP/DISCECTOMY FUSION N/A 12/24/2015   Procedure: Cervical five-six, Cervical six-seven  Anterior cervical decompression/diskectomy/fusion/interbody prosthesis/plate;  Surgeon: Garry Kansas, MD;  Location: [facility] NEURO ORS;  Service: Neurosurgery;  Laterality: N/A;   COLONOSCOPY WITH PROPOFOL  N/A 07/17/2018   Procedure: COLONOSCOPY WITH PROPOFOL ;  Surgeon: Luke Salaam, MD;  Location: [facility] ENDOSCOPY;  Service: Gastroenterology;  Laterality: N/A;   ECTOPIC PREGNANCY SURGERY  1991 and 1996   two Etopic preg.   Head Injuries  2006   surgery 2006 Cram/NS in [place]. Headaches with increased intracranial pressure. Repeat MRI in 2008 Negative   SUPRACERVICAL ABDOMINAL HYSTERECTOMY  2011   Abdominal; Ovaries intact; CERVIX INTACT. Fibroids/dys menorrhea. [place]   Ulnar Neuropathy Right 2004   Ulnar Neuropathy surgical revision. 2004-2008. [place].        Recent HPI and Hospital Course  Hospital Course:  Jamie Williams is a 62 y.o. African-American female with medical history significant for anxiety, hypertension, dyslipidemia, and GERD, who presented to the emergency room with acute onset of back pain which has been going on for a month for which she has been seen by Ortho and managed with Celebrex.  She reported taking 1 dose yesterday evening and shortly after that he experienced generalized bilateral lower extremity weakness with difficulty ambulating requiring support from walls and nearby furniture to get around in his house.  She usually ambulates without assistance.  She denies any falls or injuries.  She has a scheduled outpatient LS-spine MRI given his left-sided back pain and left sciatica.  No saddle anesthesia.  She had nausea without vomiting.  No fever or chills.  No chest pain or palpitations.  No cough or wheezing or dyspnea.  No dysuria, oliguria or hematuria or flank pain.  Not any tinnitus or vertigo.  She admits to left-sided headache earlier with dizziness or blurred vision.   ED Course:  When he came to the ER, BP was 181/105 with otherwise normal vital signs.  Labs revealed unremarkable CMP and CBC. EKG as reviewed by me : Pending. Imaging: [facility] MRI revealed acute left thalamic capsular infarcts.   The patient was given 1 g of p.o. Tylenol , 30 mg of IM Toradol , Lidoderm  5% patch, Ativan  1 mg p.o., Robaxin  500 mg p.o. and 4 mg of I p.o. Zofran .  He will be admitted to a medically monitored observation bed for further evaluation and management.       Assessment and Plan:    # Acute ischemic vertebrobasilar artery thalamic stroke, left MRI brain: Acute left thalamocapsular infarcts.  MRI lumbar spine: Degenerative changes of the lumbar spine as above most pronounced at L5-S1. Disc bulge results in lateral recess narrowing with possible impingement upon the traversing S1 nerve roots, left greater than right. Disc  bulge and facet arthrosis at L5-S1 resulting in moderate bilateral foraminal stenosis. Disc bulge and facet osteophytes likely contact and slightly deform the exiting right L5 nerve root. Degenerative endplate changes and edema at L5-S1 which may also contribute to back pain TTE: LVEF 60-65%, mild LV hypertrophy.  Negative PFO, no any other significant findings. LDL 140 above goal <70 Neurology consulted, patient was loaded with Plavix  300 mg one-time dose followed by 75 mg p.o. daily for 21 days.  Neuro recommended DAPT for 3 weeks followed by aspirin  and started Lipitor 80 mg p.o. daily.  Continue aspirin  81 mg p.o. daily for lifelong PT and OT eval done, recommended outpatient physical therapy.  Which was arranged by [facility]     # Peripheral neuropathy: Is associated with sciatica and low back pain. continued Neurontin  and pain management.   # Essential hypertension: Patient was not taking amlodipine  or hydrochlorothiazide .  Increased lisinopril  20 mg p.o. daily Patient was advised to monitor BP at home and follow with PCP Prescribed hydralazine  as needed   # Hypothyroidism: continue Synthroid .   # Dyslipidemia: Started Lipitor 80 mg p.o. daily.    Body mass index is 30.04 kg/m.  Nutrition Interventions:   - Patient was instructed, not to drive, operate heavy machinery, perform activities at heights, swimming or participation in water activities or provide baby sitting services while on Pain, Sleep and Anxiety Medications; until her outpatient Physician has advised to do so again.  - Also recommended to not to take more than prescribed Pain, Sleep and Anxiety Medications.   Patient was seen by physical therapy, who recommended outpatient physical therapy, which was arranged. On the day of the discharge the patient's vitals were stable, and no other acute medical condition were reported by patient. the patient was felt safe to be discharge at Home with outpatient PT.  Post Hospital Acute  Care Issue to be followed in the Clinic   Acute ischemic vertebrobasilar artery thalamic stroke, left [facility]) Active Problems:   Peripheral neuropathy   Essential hypertension   Hypothyroidism   Dyslipidemia   Ambulatory dysfunction     Subjective:   Jamie Williams today has, No headache, No chest pain, No abdominal pain - No Nausea, No new weakness tingling or numbness. Weakness of right upper extremity and right hand. Impaired balance.   Assessment & Plan   1. Hospital discharge follow-up (Primary) Treated in hospital for stroke, has residual deficits, needs to have outpatient OT and PT  2. Personal history of stroke with residual effects Referred to PT and OT - Ambulatory referral to Physical Therapy - Ambulatory referral to Occupational Therapy  3. Impairment of balance Referred to PT - Ambulatory referral to Physical Therapy  4. Weakness of right upper extremity Referred to OT  - Ambulatory referral to Occupational Therapy  5. Primary hypertension Medications were changed by the hospital and all of her BP medication was changed. Lisinopril  dose increased to 40 mg daily. She has an upcoming visit with Lauren PA-C next month and will reassess her BP then.  - lisinopril  (ZESTRIL ) 40 MG tablet; Take 1 tablet (40 mg total) by mouth daily.  Dispense: 30 tablet; Refill: 2  6. Cervical spondylosis with radiculopathy Take tramadol  as needed.  - traMADol  (ULTRAM ) 50 MG tablet; Take 1 tablet (50 mg total) by mouth every 6 (six) hours as needed for up to 5 days for moderate pain (pain score 4-6) or severe pain (pain score 7-10).  Dispense: 20 tablet; Refill: 0  7. Hot flashes due to menopause Lexapro  prescribed for anxiety but may help with her hot flashes as well. Estradiol  was stopped due to the stroke she had  - escitalopram  (LEXAPRO ) 10 MG tablet; Take 1 tablet (10 mg total) by mouth daily.  Dispense: 30 tablet; Refill: 5  8. Gastroesophageal reflux disease without  esophagitis Continue pantoprazole   - pantoprazole  (PROTONIX ) 40 MG tablet; Take 1 tablet (40 mg total) by mouth daily.  Dispense: 30 tablet; Refill: 11  9. History of recent stroke Referred to OT and PT outpatient  - Ambulatory referral to Physical Therapy - Ambulatory referral to Occupational Therapy  10. Encounter for screening mammogram for malignant neoplasm of breast Routine mammogram ordered  - MM 3D SCREENING MAMMOGRAM BILATERAL BREAST; Future  11. Anxiety disorder due to medical condition Lexapro  prescribed to control her anxiety. Follow up in 4 weeks with Lauren PA-C - escitalopram  (LEXAPRO ) 10 MG tablet; Take 1 tablet (10 mg total) by mouth daily.  Dispense: 30 tablet; Refill: 5    Reason for frequent admissions/ER visits       Objective:   Vitals:   04/03/24 1532  BP: (!) 168/98  Pulse: 87  Resp: 16  Temp: (!) 97.2 F (36.2 C)  SpO2: 99%  Weight: 173 lb (78.5 kg)  Height: 5' 5 (1.651 m)    Wt Readings from Last 3 Encounters:  04/03/24 173 lb (78.5 kg)  03/27/24 180 lb 8 oz (81.9 kg)  02/27/24 181 lb (82.1 kg)    Allergies as of 04/03/2024       Reactions   Celebrex [celecoxib] Other (See Comments)   Dizziness, light-headedness   Aspirin  Nausea And Vomiting   Penicillins Rash   Sulfa Antibiotics Hives        Medication List        Accurate as of April 03, 2024  4:16 PM. If you have any questions, ask your nurse or doctor.          aspirin  EC 81 MG tablet Take 1 tablet (81 mg total) by mouth daily. Swallow whole.   atorvastatin  80 MG tablet Commonly known as: LIPITOR Take 1 tablet (80 mg total) by mouth at bedtime.   cholecalciferol  1000 units tablet Commonly known as: VITAMIN D  Take 2,000 Units by mouth daily.   clopidogrel  75 MG tablet Commonly known as: PLAVIX  Take 1 tablet (75 mg total) by mouth daily for 20 days.   colchicine  0.6 MG tablet Take 2 tablets (1.2 mg) on first day of gout flare, then 1 tablet daily    cyclobenzaprine  10 MG tablet Commonly known as: FLEXERIL  Take 1 tablet (10 mg  total) by mouth 3 (three) times daily as needed for muscle spasms. Take one tab po qhs for back spasm prn only   erythromycin  ophthalmic ointment Place 1 Application into the left eye at bedtime.   escitalopram  10 MG tablet Commonly known as: LEXAPRO  Take 1 tablet (10 mg total) by mouth daily. Started by: Emmy Keng   hydrALAZINE  50 MG tablet Commonly known as: APRESOLINE  Take 1 tablet (50 mg total) by mouth 3 (three) times daily as needed (If systolic BP greater than 150 mmHg.). If systolic BP greater than 150 mmHg.   lisinopril  40 MG tablet Commonly known as: ZESTRIL  Take 1 tablet (40 mg total) by mouth daily. What changed:  medication strength how much to take Changed by: Laurence Pons   nicotine  7 mg/24hr patch Commonly known as: NICODERM CQ  - dosed in mg/24 hr PLACE 1 PATCH ONTO THE SKIN DAILY   pantoprazole  40 MG tablet Commonly known as: Protonix  Take 1 tablet (40 mg total) by mouth daily.   promethazine  25 MG tablet Commonly known as: PHENERGAN  TAKE 1 TABLET(25 MG) BY MOUTH EVERY 6 HOURS AS NEEDED FOR NAUSEA OR VOMITING   traMADol  50 MG tablet Commonly known as: ULTRAM  Take 1 tablet (50 mg total) by mouth every 6 (six) hours as needed for up to 5 days for moderate pain (pain score 4-6) or severe pain (pain score 7-10). Started by: Laurence Pons         Physical Exam: Constitutional: Patient appears well-developed and well-nourished. Not in obvious distress. HENT: Normocephalic, atraumatic, External right and left ear normal. Oropharynx is clear and moist.  Eyes: Conjunctivae and EOM are normal. PERRLA, no scleral icterus. Neck: Normal ROM. Neck supple. No JVD. No tracheal deviation. No thyromegaly. CVS: RRR, S1/S2 +, no murmurs, no gallops, no carotid bruit.  Pulmonary: Effort and breath sounds normal, no stridor, rhonchi, wheezes, rales.  Abdominal: Soft. BS +, no  distension, tenderness, rebound or guarding.  Musculoskeletal: Normal range of motion. No edema and no tenderness.  Lymphadenopathy: No lymphadenopathy noted, cervical, inguinal or axillary Neuro: Alert. Normal reflexes, muscle tone coordination. No cranial nerve deficit. Skin: Skin is warm and dry. No rash noted. Not diaphoretic. No erythema. No pallor. Psychiatric: Normal mood and affect. Behavior, judgment, thought content normal.   Data Review   Micro Results No results found for this or any previous visit (from the past 240 hours).   CBC No results for input(s): WBC, HGB, HCT, PLT, MCV, MCH, MCHC, RDW, LYMPHSABS, MONOABS, EOSABS, BASOSABS, BANDABS in the last 168 hours.  Invalid input(s): NEUTRABS, BANDSABD  Chemistries  No results for input(s): NA, K, CL, CO2, GLUCOSE, BUN, CREATININE, CALCIUM , MG, AST, ALT, ALKPHOS, BILITOT in the last 168 hours.  Invalid input(s): GFRCGP ------------------------------------------------------------------------------------------------------------------ estimated creatinine clearance is 71.1 mL/min (by C-G formula based on SCr of 0.86 mg/dL). ------------------------------------------------------------------------------------------------------------------ No results for input(s): HGBA1C in the last 72 hours. ------------------------------------------------------------------------------------------------------------------ No results for input(s): CHOL, HDL, LDLCALC, TRIG, CHOLHDL, LDLDIRECT in the last 72 hours. ------------------------------------------------------------------------------------------------------------------ No results for input(s): TSH, T4TOTAL, T3FREE, THYROIDAB in the last 72 hours.  Invalid input(s): FREET3 ------------------------------------------------------------------------------------------------------------------ No results for input(s):  VITAMINB12, FOLATE, FERRITIN, TIBC, IRON, RETICCTPCT in the last 72 hours.  Coagulation profile No results for input(s): INR, PROTIME in the last 168 hours.  No results for input(s): DDIMER in the last 72 hours.  Cardiac Enzymes No results for input(s): CKMB, TROPONINI, MYOGLOBIN in the last 168 hours.  Invalid input(s): CK ------------------------------------------------------------------------------------------------------------------ Invalid input(s): POCBNP  Return in about  4 weeks (around 05/01/2024) for F/U with lauren for new med and new medications from hosp.  and BP check, lisinipril increased.   Time Spent in minutes  45 Time spent with patient included reviewing progress notes, labs, imaging studies, and discussing plan for follow up.   This patient was seen by Laurence Pons, FNP-C in collaboration with Dr. Verneta Gone as a part of collaborative care agreement.    Laurence Pons MSN, FNP-C on 04/03/2024 at 4:16 PM   **Disclaimer: This note may have been dictated with voice recognition software. Similar sounding words can inadvertently be transcribed and this note may contain transcription errors which may not have been corrected upon publication of note.**

## 2024-04-04 ENCOUNTER — Telehealth: Payer: Self-pay | Admitting: Physician Assistant

## 2024-04-04 NOTE — Telephone Encounter (Signed)
 Notified patient of mammo appointment date, arrival time, location -Jamie Williams

## 2024-04-06 ENCOUNTER — Encounter: Payer: Self-pay | Admitting: Nurse Practitioner

## 2024-04-19 ENCOUNTER — Ambulatory Visit: Admitting: Occupational Therapy

## 2024-04-19 ENCOUNTER — Ambulatory Visit: Attending: Nurse Practitioner

## 2024-04-19 DIAGNOSIS — M6281 Muscle weakness (generalized): Secondary | ICD-10-CM | POA: Insufficient documentation

## 2024-04-19 DIAGNOSIS — R278 Other lack of coordination: Secondary | ICD-10-CM | POA: Insufficient documentation

## 2024-04-19 DIAGNOSIS — Z8673 Personal history of transient ischemic attack (TIA), and cerebral infarction without residual deficits: Secondary | ICD-10-CM | POA: Insufficient documentation

## 2024-04-19 DIAGNOSIS — R2681 Unsteadiness on feet: Secondary | ICD-10-CM | POA: Insufficient documentation

## 2024-04-19 DIAGNOSIS — R29898 Other symptoms and signs involving the musculoskeletal system: Secondary | ICD-10-CM | POA: Insufficient documentation

## 2024-04-19 DIAGNOSIS — I6930 Unspecified sequelae of cerebral infarction: Secondary | ICD-10-CM | POA: Insufficient documentation

## 2024-04-19 NOTE — Therapy (Signed)
 OUTPATIENT OCCUPATIONAL THERAPY NEURO EVALUATION  Patient Name: Jamie Williams MRN: [id number] DOB:November 24, 1960, 63 y.o., female Today's Date: 04/19/2024  PCP: Kristina Tinnie POUR, PA-C REFERRING PROVIDER: Liana Fish, NP  END OF SESSION:  OT End of Session - 04/19/24 1718     Visit Number 1    Number of Visits 24    Date for OT Re-Evaluation 07/12/24    OT Start Time 1530    OT Stop Time 1615    OT Time Calculation (min) 45 min    Activity Tolerance Patient tolerated treatment well    Behavior During Therapy WFL for tasks assessed/performed             Past Medical History:  Diagnosis Date   Anxiety    Family history of adverse reaction to anesthesia    sister had a difficult time waking up from mastectomy and passed away    GERD (gastroesophageal reflux disease)    History of chicken pox    History of kidney stones    Hyperlipidemia    Hypertension    PONV (postoperative nausea and vomiting)    nausea   Sciatic nerve pain    Past Surgical History:  Procedure Laterality Date   ANTERIOR CERVICAL DECOMP/DISCECTOMY FUSION N/A 12/24/2015   Procedure: Cervical five-six, Cervical six-seven  Anterior cervical decompression/diskectomy/fusion/interbody prosthesis/plate;  Surgeon: Reyes Budge, MD;  Location: [facility];  Service: Neurosurgery;  Laterality: N/A;   COLONOSCOPY WITH PROPOFOL  N/A 07/17/2018   Procedure: COLONOSCOPY WITH PROPOFOL ;  Surgeon: Therisa Bi, MD;  Location: [facility] ENDOSCOPY;  Service: Gastroenterology;  Laterality: N/A;   ECTOPIC PREGNANCY SURGERY  1991 and 1996   two Etopic preg.   Head Injuries  2006   surgery 2006 Cram/NS in [place]. Headaches with increased intracranial pressure. Repeat MRI in 2008 Negative   SUPRACERVICAL ABDOMINAL HYSTERECTOMY  2011   Abdominal; Ovaries intact; CERVIX INTACT. Fibroids/dys menorrhea. Weaver-Lee   Ulnar Neuropathy Right 2004   Ulnar Neuropathy surgical revision. 2004-2008. [place].   Patient  Active Problem List   Diagnosis Date Noted   Acute ischemic vertebrobasilar artery thalamic stroke, left (HCC) 03/27/2024   Essential hypertension 03/27/2024   Hypothyroidism 03/27/2024   Dyslipidemia 03/27/2024   Peripheral neuropathy 03/27/2024   Ambulatory dysfunction 03/27/2024   Hypothyroid 07/22/2020   Chronic, continuous use of opioids 04/15/2020   Pap smear abnormality of cervix/human papillomavirus (HPV) positive 08/06/2019   Aortic atherosclerosis (HCC) 10/23/2018   GERD (gastroesophageal reflux disease)    Hot flashes due to menopause 01/10/2017   Cervical spondylosis with radiculopathy 12/24/2015   Compression injury of cervical spinal nerve 10/07/2015   Left arm pain 08/29/2015   Neck pain 08/29/2015   Hematuria 07/03/2015   Allergic arthritis 06/27/2015   Arthritis 06/27/2015   Back ache 06/27/2015   Clinical depression 06/27/2015   H/O abnormal cervical Papanicolaou smear 06/27/2015   Elevated intracranial pressure 06/27/2015   Kidney stones 06/27/2015   Knee pain 06/27/2015   Abnormal mammogram 06/27/2015   Headache, migraine 06/27/2015   Neuropathy 06/27/2015   Hand paresthesia 06/27/2015   Pre-diabetes 06/27/2015   Vitamin D  deficiency 06/27/2015   Weight loss 06/27/2015   Adjustment reaction 06/27/2015   Hypercholesterolemia without hypertriglyceridemia 01/29/2010   Tobacco abuse 01/28/2010   Primary hypertension 09/16/2009   Insomnia 09/16/2009   Lesion of ulnar nerve 09/16/2009    ONSET DATE: 03/26/24  REFERRING DIAG: CVA  THERAPY DIAG:  Weakness, lack of coordination  Rationale for Evaluation and Treatment: Rehabilitation  SUBJECTIVE:  SUBJECTIVE STATEMENT:  Pt. is doing well overall. Pt accompanied by: self  PERTINENT HISTORY:   Pt. Is a 63 y.o. female who was admitted to the hospital with an acute onset of back pain, trouble walking RUE and LE weakness. Imaging revealed an acute ischemic vertebrobasilar artery thalamic stroke, and  Degenerative Changes of the Lumbar spine  L5-S1. PHMx includes: Peripheral neuropathy, Essential HTN, Hypothyroidism, Dyslipidemia, Ambulatory Dysfunction. Pt. Has a history of neck, and right shoulder pain with a planned MRI in August 2025. Pt. Has a History of remote right elbow injury from working in the [employer].   PRECAUTIONS: None  WEIGHT BEARING RESTRICTIONS: No  PAIN:  Are you having pain? 7/10 Sciatica; sometimes reaches a 10/10 at home  FALLS: Has patient fallen in last 6 months? No  LIVING ENVIRONMENT: Lives with: lives with their spouse and lives with their son Lives in: House/apartment Stairs:  3 steps to enter; one level home Has following equipment at home: Quad cane small base, 2 wheeled walker BSCommode  PLOF: Independent  PATIENT GOALS: To get back to her PLOF  OBJECTIVE:  Note: Objective measures were completed at Evaluation unless otherwise noted.  HAND DOMINANCE: Right  ADLs:  Transfers/ambulation related to ADLs: Independent Eating: Independent, increased time required, loses grip on a glass Grooming: Independent UB Dressing: Independent, difficulty with buttoning LB Dressing: independent Toileting: Independent Bathing: Independent  Tub Shower transfers: Independent; distance supervision -showers when family is home   IADLs: Shopping: Sister has been doing the grocery shopping  Light housekeeping: Writer,  has not washed dishes, independent with bedmaking increased time  2/2 decreased balance Meal Prep: Independent Community mobility: Relies on family and friends Medication management: difficulty using the dominant right hand to open bottles, and manipulate medication Financial management: No changes in the process Handwriting: 50% legible for name only 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 Hobbies: Crafts  MOBILITY STATUS: Independent without an assistive device    ACTIVITY TOLERANCE: Activity tolerance:  WFL  FUNCTIONAL OUTCOME MEASURES: MAM Measure Sum Score: 69   UPPER EXTREMITY ROM:    Active ROM Right Eval 04/19/24 Left Eval 04/19/24  Shoulder flexion 92(103) WFL  Shoulder abduction 82(90) [facility]  Shoulder adduction    Shoulder extension    Shoulder internal rotation    Shoulder external rotation    Elbow flexion [facility] WFL  Elbow extension [facility]  Wrist flexion [facility] WFL  Wrist extension [facility] WFL  Wrist ulnar deviation    Wrist radial deviation    Wrist pronation    Wrist supination    (Blank rows = not tested)  UPPER EXTREMITY MMT:     MMT Right Eval 04/19/24 Left Eval 04/19/24  Shoulder flexion 3-/5 5/5  Shoulder abduction 3-/5 5/5  Shoulder adduction    Shoulder extension    Shoulder internal rotation    Shoulder external rotation    Middle trapezius    Lower trapezius    Elbow flexion 4/5 5/5  Elbow extension 4/5 5/5  Wrist flexion 4/5 5/5  Wrist extension 4/5 5/5  Wrist ulnar deviation    Wrist radial deviation    Wrist pronation    Wrist supination    (Blank rows = not tested)  HAND FUNCTION: Grip strength: Right: 63 lbs; Left: 75 lbs, Lateral pinch: Right: 15 lbs, Left: 16 lbs, and 3 point pinch: Right: 14 lbs, Left: 17 lbs  COORDINATION: 9 Hole Peg test: Right: 27 sec; Left: 22 sec  SENSATION: tingling/numb [facility]  Light touch: WFL Proprioception: WFL  EDEMA: WFL   COGNITION: Overall cognitive status: Within functional limits for tasks assessed; Pt. Reports that sometimes she can remember things, and sometimes  VISION: Subjective report: blurriness since the stroke   PRAXIS: Impaired fine motor control   TREATMENT DATE: 04/19/24   OT initial evaluation complete, and Pt. education was provided as indicated below.   PATIENT EDUCATION: Education details: OT services, POC, goals, ADL, and IADL functioning Person educated: Patient Education method: Explanation, Demonstration, and Tactile cues Education comprehension: verbalized  understanding, returned demonstration, verbal cues required, tactile cues required, and needs further education  HOME EXERCISE PROGRAM:  Continue to provide as indicated  GOALS: Goals reviewed with patient? yes  SHORT TERM GOALS: Target date:  05/31/2024  Pt. Will be independent with HEPs for the RUE. Baseline: Eval: No current HEP Goal status: INITIAL  LONG TERM GOALS: Target date: 07/12/2024  Pt. will be improve the MAM-20 score by 2 points to reflect progress with hand function during ADL/IADL tasks. Baseline: Eval: MAM-20 Sum score: 69 Goal status: INITIAL  2.  Pt. Will improve Right shoulder ROM by 5 degrees to be able reaching into cabinetry. Baseline: Eval: Right shoulder flexion: 92(103), Abduction: 82 (90)  Goal status: INITIAL  3.  Pt. Will improve RUE strength by 2 muscle grades to assist with ADLs, and IADLs. Baseline: Eval: Right shoulder flexion: 3-/5, abduction: 3-/5, elbow flexion: 4/5, extension: 4/5, wrist flexion: 4/5, extension: 4/5 Goal status: INITIAL  4.  Pt. Will increase right grip strength by 5# to be able to securely hold objects Baseline: Eval: Right: 63#, L: 75# Goal status: INITIAL  5.  Pt. Will increase right pinch strength by 3# to be able to open jars, bottles, containers Baseline: Eval: Lateral pinch: right: 15#, left: 16#; 3pt. Pinch: right: 14#, left: 17# Goal status: INITIAL  6.  Pt. Will improve right hand [facility] skills by 3 sec. Of speed to be able to efficiently manipulate small objects/buttons. Baseline: Right: 27 sec. Left: 22 sec. Goal status: INITIAL  6.  Pt. Will write 3 sentences efficiently with 75% legibility in preparation for written correspondence Baseline: Eval: name only: 50% legibility  Goal status: INITIAL   ASSESSMENT:  CLINICAL IMPRESSION:  Patient is a 63 y.o. female who was seen today for occupational therapy evaluation for CVA.  Pt. presents with decreased right shoulder ROM, limited RUE strength, decreased right  grip strength, pinch strength, and impaired [facility] skills which limit her ability to efficiently complete daily ADL, and IADL tasks including: clipping nails, buttoning, opening bottles/jars/containers, securely holding a glass, and reaching up to higher shelves. MAM-20 score sum score: 69. Pt. will benefit from OT services to improve right hand function in order to work towards improving, and maximizing engagement in, and  independence with ADLs, and IADL tasks.   PERFORMANCE DEFICITS: in functional skills including ADLs, IADLs, coordination, dexterity, proprioception, ROM, strength, Fine motor control, Gross motor control, vision, and UE functional use, pain.cognitive skills including memory, and psychosocial skills coping strategies, environmental adaptation, interpersonal interactions, and routines and behaviors.   IMPAIRMENTS: are limiting patient from ADLs, IADLs, rest and sleep, and leisure.   CO-MORBIDITIES: may have co-morbidities  that affects occupational performance. Patient will benefit from skilled OT to address above impairments and improve overall function.  MODIFICATION OR ASSISTANCE TO COMPLETE EVALUATION: Min-Moderate modification of tasks or assist with assess necessary to complete an evaluation.  OT OCCUPATIONAL PROFILE AND HISTORY: Detailed assessment: Review of records and additional review  of physical, cognitive, psychosocial history related to current functional performance.  CLINICAL DECISION MAKING: Moderate - several treatment options, min-mod task modification necessary  REHAB POTENTIAL: Good  EVALUATION COMPLEXITY: Moderate    PLAN:  OT FREQUENCY: 2x/week  OT DURATION: 12 weeks  PLANNED INTERVENTIONS: 97168 OT Re-evaluation, 97535 self care/ADL training, 02889 therapeutic exercise, 97530 therapeutic activity, 97112 neuromuscular re-education, 97140 manual therapy, 97018 paraffin, 02989 moist heat, 97034 contrast bath, passive range of motion, functional mobility  training, patient/family education, and DME and/or AE instructions  RECOMMENDED OTHER SERVICES: OT  CONSULTED AND AGREED WITH PLAN OF CARE: Patient  PLAN FOR NEXT SESSION:   Treatment  Richardson Otter, MS, OTR/L   04/19/2024, 5:20 PM

## 2024-04-19 NOTE — Therapy (Signed)
 OUTPATIENT PHYSICAL THERAPY NEURO EVALUATION   Patient Name: Jamie Williams MRN: [id number] DOB:07-Jun-1961, 63 y.o., female Today's Date: 04/19/2024   PCP: Kristina Tinnie POUR, PA-C REFERRING PROVIDER: Liana Fish, NP  END OF SESSION:  PT End of Session - 04/19/24 1622     Visit Number 1    Number of Visits 25    Date for PT Re-Evaluation 07/12/24    PT Start Time 1451    PT Stop Time 1530    PT Time Calculation (min) 39 min    Equipment Utilized During Treatment Gait belt    Activity Tolerance Patient tolerated treatment well    Behavior During Therapy WFL for tasks assessed/performed          Past Medical History:  Diagnosis Date   Anxiety    Family history of adverse reaction to anesthesia    sister had a difficult time waking up from mastectomy and passed away    GERD (gastroesophageal reflux disease)    History of chicken pox    History of kidney stones    Hyperlipidemia    Hypertension    PONV (postoperative nausea and vomiting)    nausea   Sciatic nerve pain    Past Surgical History:  Procedure Laterality Date   ANTERIOR CERVICAL DECOMP/DISCECTOMY FUSION N/A 12/24/2015   Procedure: Cervical five-six, Cervical six-seven  Anterior cervical decompression/diskectomy/fusion/interbody prosthesis/plate;  Surgeon: Reyes Budge, MD;  Location: [facility];  Service: Neurosurgery;  Laterality: N/A;   COLONOSCOPY WITH PROPOFOL  N/A 07/17/2018   Procedure: COLONOSCOPY WITH PROPOFOL ;  Surgeon: Therisa Bi, MD;  Location: [facility] ENDOSCOPY;  Service: Gastroenterology;  Laterality: N/A;   ECTOPIC PREGNANCY SURGERY  1991 and 1996   two Etopic preg.   Head Injuries  2006   surgery 2006 Cram/NS in [place]. Headaches with increased intracranial pressure. Repeat MRI in 2008 Negative   SUPRACERVICAL ABDOMINAL HYSTERECTOMY  2011   Abdominal; Ovaries intact; CERVIX INTACT. Fibroids/dys menorrhea. Weaver-Lee   Ulnar Neuropathy Right 2004   Ulnar Neuropathy surgical  revision. 2004-2008. [place].   Patient Active Problem List   Diagnosis Date Noted   Acute ischemic vertebrobasilar artery thalamic stroke, left (HCC) 03/27/2024   Essential hypertension 03/27/2024   Hypothyroidism 03/27/2024   Dyslipidemia 03/27/2024   Peripheral neuropathy 03/27/2024   Ambulatory dysfunction 03/27/2024   Hypothyroid 07/22/2020   Chronic, continuous use of opioids 04/15/2020   Pap smear abnormality of cervix/human papillomavirus (HPV) positive 08/06/2019   Aortic atherosclerosis (HCC) 10/23/2018   GERD (gastroesophageal reflux disease)    Hot flashes due to menopause 01/10/2017   Cervical spondylosis with radiculopathy 12/24/2015   Compression injury of cervical spinal nerve 10/07/2015   Left arm pain 08/29/2015   Neck pain 08/29/2015   Hematuria 07/03/2015   Allergic arthritis 06/27/2015   Arthritis 06/27/2015   Back ache 06/27/2015   Clinical depression 06/27/2015   H/O abnormal cervical Papanicolaou smear 06/27/2015   Elevated intracranial pressure 06/27/2015   Kidney stones 06/27/2015   Knee pain 06/27/2015   Abnormal mammogram 06/27/2015   Headache, migraine 06/27/2015   Neuropathy 06/27/2015   Hand paresthesia 06/27/2015   Pre-diabetes 06/27/2015   Vitamin D  deficiency 06/27/2015   Weight loss 06/27/2015   Adjustment reaction 06/27/2015   Hypercholesterolemia without hypertriglyceridemia 01/29/2010   Tobacco abuse 01/28/2010   Primary hypertension 09/16/2009   Insomnia 09/16/2009   Lesion of ulnar nerve 09/16/2009    ONSET DATE: 03/26/2024  REFERRING DIAG:  Diagnosis  R26.89 (ICD-10-CM) - Impairment of balance  Z86.73 (  ICD-10-CM) - History of recent stroke  I69.30 (ICD-10-CM) - Personal history of stroke with residual effects    THERAPY DIAG:  Muscle weakness (generalized)  Unsteadiness on feet  Other lack of coordination  Rationale for Evaluation and Treatment: Rehabilitation  SUBJECTIVE:                                                                                                                                                                                              SUBJECTIVE STATEMENT: The pt is a pleasant 63 y/o female presenting to PT eval s/p CVA. Pt reports onset of symptoms 03/26/2024 where she couldn't walk. She went to ED and was found to have had a stroke.  Pt reports when she first got home she started out using a RW and about a week later switched to a cane. Currently ambulating without an AD, but still uses SPC sometimes. Sometimes she can feel unbalanced when she first stands up and can stagger. Sometimes she feels lightheaded. She says if she's outside for too long she can feel dizzy/swimmy-headed. Pt reports poor strength, including in R hand (feels tinging, she is R hand dominant). She has some difficulty standing up from sitting, she uses her arms to help her. She has some difficulty getting out of bed.  Pt feels she's kind of cautious/scared now with certain activities due to unsteadiness.    Pt accompanied by: self  PERTINENT HISTORY:   Per chart PMH includes HTN, anxiety, dyslipidemia, GERD, peripheral neuropathy, hypothyroidism  PAIN:  Are you having pain? Lower L side back pain, reports chronic sciatica and arthritis-related pain  PRECAUTIONS: None  RED FLAGS: None   WEIGHT BEARING RESTRICTIONS: No  FALLS: Has patient fallen in last 6 months? No  LIVING ENVIRONMENT: Lives with: lives with their spouse and lives with their son Lives in: House/apartment Stairs: 3 steps (bilateral handrails) Has following equipment at home: Single point cane, Environmental consultant - 2 wheeled, and shower chair  PLOF: Independent  PATIENT GOALS: I want to get back to me   OBJECTIVE:  Note: Objective measures were completed at Evaluation unless otherwise noted.  DIAGNOSTIC FINDINGS: via chart  CT HEAD 03/27/24:  IMPRESSION: 1. No acute intracranial hemorrhage. 2. Left thalamic infarct, less well visualized  than on the prior MRI. 3. No large vessel occlusion, hemodynamically significant stenosis, or aneurysm in the head or neck.   Electronically signed by: Lonni Necessary MD 03/27/2024   MR brain 03/27/24: FINDINGS: Brain: Acute left thalamocapsular infarcts. Mild associated edema without mass effect. No midline shift. No evidence of acute hemorrhage, mass lesion, or hydrocephalus.   Vascular: Major arterial  flow voids are maintained at the skull base.   Skull and upper cervical spine: Normal marrow signal.   Sinuses/Orbits: Clear sinuses.  No acute orbital findings.   Other: No mastoid effusions.   IMPRESSION: Acute left thalamocapsular infarcts.     Electronically Signed   By: Gilmore GORMAN Molt M.D.   On: 03/27/2024 00:01  COGNITION: Overall cognitive status: WFL for tasks assessed, pt does note some memory changes since her stroke    SENSATION: Reports numbness/tingling in R hand  COORDINATION: WFL LE rapid alt movement and heel>shin   EDEMA:  No swelling per pt report     POSTURE: slight increased thoracic kyphosis, elevated L shoulder     LOWER EXTREMITY MMT:    MMT Right Eval Left Eval  Hip flexion 4 4+  Hip extension    Hip abduction 4 4+  Hip adduction 4+ 4+  Hip internal rotation    Hip external rotation    Knee flexion 4+ 4+  Knee extension 5 5  Ankle dorsiflexion 4+ 4+  Ankle plantarflexion    Ankle inversion    Ankle eversion    (Blank rows = not tested)  Plantarflexion endurance test R 2 reps vs L 10 reps (somewhat limited by L knee pain)  BED MOBILITY:  Somewhat impaired per pt report since CVA  TRANSFERS: Requires use of BUE   STAIRS: Reports she is not confident descend/ascending steps without handrails  GAIT: Findings: noted slight off-loading RLE and elevated L shoulder/side during gait   FUNCTIONAL TESTS:  5 times sit to stand: 23 seconds with use BUE to push off chair  10 meter walk test: 1.18 m/s noted slight  off-loading RLE and elevated L shoulder/side during gait  Berg Balance Scale: 47/56    PATIENT SURVEYS:  ABC scale: The Activities-Specific Balance Confidence (ABC) Scale score: 52.5%                                                                                                                               TREATMENT DATE: 04/19/24   Self-Care: PT reviewed exam findings, indications, goals, plan   PATIENT EDUCATION: Education details: exam findings, indications, goals, plan Person educated: Patient Education method: Explanation Education comprehension: verbalized understanding  HOME EXERCISE PROGRAM: To be initiated next 1-2 visits  GOALS: Goals reviewed with patient? Yes   SHORT TERM GOALS: Target date: 05/31/2024    Patient will be independent in home exercise program to improve strength/mobility for better functional independence with ADLs. Baseline: Goal status: INITIAL   LONG TERM GOALS: Target date: 07/12/2024    Patient will increase ABC scale score >80% to demonstrate better functional mobility and better confidence with ADLs.  Baseline: 52.5% Goal status: INITIAL  2.  Patient (> 33 years old) will complete five times sit to stand test in < 15 seconds indicating an increased LE strength and improved balance. Baseline: 23 sec with use of BUE Goal status: INITIAL  3.  Patient will increase Berg Balance score by > 6 points to demonstrate decreased fall risk during functional activities Baseline: 47/56 Goal status: INITIAL  4.  Patient will increase 10 meter walk test to >1.3 m/s as to improve gait speed for better community ambulation. Baseline: 1.18 m/s Goal status: INITIAL     ASSESSMENT:  CLINICAL IMPRESSION: Patient is a pleasant 63 y.o. female who was seen today for physical therapy evaluation and treatment s/p CVA.  Exam findings indicate impairments of sensation, pain, decreased BLE strength, impaired gait mechanics, and decreased balance with  increased fall risk. Pt also with reports of FOF and decreased balance confidence AEB ABC questionnaire score. The pt will benefit from further skilled PT to improve these deficits in order to increase ease and safety with mobility and ADLs and reduce fall risk.  OBJECTIVE IMPAIRMENTS: Abnormal gait, decreased balance, decreased mobility, decreased strength, impaired sensation, improper body mechanics, postural dysfunction, and pain.   ACTIVITY LIMITATIONS: bending, bed mobility, and locomotion level  PARTICIPATION LIMITATIONS: meal prep, cleaning, shopping, community activity, and yard work  PERSONAL FACTORS: Sex and 3+ comorbidities: Per chart PMH includes HTN, anxiety, dyslipidemia, GERD, peripheral neuropathy, hypothyroidism are also affecting patient's functional outcome.   REHAB POTENTIAL: Good  CLINICAL DECISION MAKING: Evolving/moderate complexity  EVALUATION COMPLEXITY: Moderate  PLAN:  PT FREQUENCY: 1-2x/week  PT DURATION: 12 weeks  PLANNED INTERVENTIONS: 97164- PT Re-evaluation, 97750- Physical Performance Testing, 97110-Therapeutic exercises, 97530- Therapeutic activity, 97112- Neuromuscular re-education, 97535- Self Care, 02859- Manual therapy, [phone]- Gait training, [phone]- Orthotic Initial, [phone]- Canalith repositioning, Patient/Family education, Balance training, Stair training, Joint mobilization, Spinal mobilization, Vestibular training, DME instructions, Cryotherapy, and Moist heat  PLAN FOR NEXT SESSION: balance, strength, initiate HEP Jamie Williams PT, DPT   Jamie Williams, PT 04/19/2024, 4:23 PM

## 2024-04-23 ENCOUNTER — Telehealth: Payer: Self-pay

## 2024-04-23 ENCOUNTER — Ambulatory Visit: Admitting: Physical Therapy

## 2024-04-23 ENCOUNTER — Ambulatory Visit

## 2024-04-23 ENCOUNTER — Other Ambulatory Visit: Payer: Self-pay

## 2024-04-23 MED ORDER — PROMETHAZINE HCL 25 MG PO TABS: ORAL_TABLET | ORAL | 3 refills | Status: DC

## 2024-04-24 ENCOUNTER — Other Ambulatory Visit: Payer: Self-pay | Admitting: Physician Assistant

## 2024-04-24 DIAGNOSIS — M5442 Lumbago with sciatica, left side: Secondary | ICD-10-CM

## 2024-04-24 DIAGNOSIS — M4722 Other spondylosis with radiculopathy, cervical region: Secondary | ICD-10-CM

## 2024-04-24 MED ORDER — TRAMADOL HCL 50 MG PO TABS: ORAL_TABLET | ORAL | 0 refills | Status: DC

## 2024-04-24 NOTE — Telephone Encounter (Signed)
Tried to call patient, no voicemail set up  °

## 2024-04-25 NOTE — Telephone Encounter (Signed)
 Patient notified

## 2024-04-26 ENCOUNTER — Ambulatory Visit: Attending: Nurse Practitioner | Admitting: Physical Therapy

## 2024-04-26 DIAGNOSIS — M6281 Muscle weakness (generalized): Secondary | ICD-10-CM | POA: Insufficient documentation

## 2024-04-26 DIAGNOSIS — R2681 Unsteadiness on feet: Secondary | ICD-10-CM | POA: Insufficient documentation

## 2024-04-26 DIAGNOSIS — R278 Other lack of coordination: Secondary | ICD-10-CM | POA: Diagnosis not present

## 2024-04-26 NOTE — Therapy (Signed)
 OUTPATIENT PHYSICAL THERAPY NEURO TREATMENT   Patient Name: Jamie Williams MRN: [id number] DOB:09-02-1961, 63 y.o., female Today's Date: 04/26/2024   PCP: Kristina Tinnie POUR, PA-C REFERRING PROVIDER: Liana Fish, NP  END OF SESSION:  PT End of Session - 04/26/24 1355     Visit Number 2    Number of Visits 25    Date for PT Re-Evaluation 07/12/24    Progress Note Due on Visit 10    PT Start Time 1400    PT Stop Time 1441    PT Time Calculation (min) 41 min    Equipment Utilized During Treatment Gait belt    Activity Tolerance Patient tolerated treatment well    Behavior During Therapy WFL for tasks assessed/performed           Past Medical History:  Diagnosis Date   Anxiety    Family history of adverse reaction to anesthesia    sister had a difficult time waking up from mastectomy and passed away    GERD (gastroesophageal reflux disease)    History of chicken pox    History of kidney stones    Hyperlipidemia    Hypertension    PONV (postoperative nausea and vomiting)    nausea   Sciatic nerve pain    Past Surgical History:  Procedure Laterality Date   ANTERIOR CERVICAL DECOMP/DISCECTOMY FUSION N/A 12/24/2015   Procedure: Cervical five-six, Cervical six-seven  Anterior cervical decompression/diskectomy/fusion/interbody prosthesis/plate;  Surgeon: Reyes Budge, MD;  Location: [facility]UR[facility]S;  Service: Neurosurgery;  Laterality: N/A;   COLONOSCOPY WITH PROPOFOL  N/A 07/17/2018   Procedure: COLONOSCOPY WITH PROPOFOL ;  Surgeon: Therisa Bi, MD;  Location: [facility] ENDOSCOPY;  Service: Gastroenterology;  Laterality: N/A;   ECTOPIC PREGNANCY SURGERY  1991 and 1996   two Etopic preg.   Head Injuries  2006   surgery 2006 Cram/NS in [place]. Headaches with increased intracranial pressure. Repeat MRI in 2008 Negative   SUPRACERVICAL ABDOMINAL HYSTERECTOMY  2011   Abdominal; Ovaries intact; CERVIX INTACT. Fibroids/dys menorrhea. Weaver-Lee   Ulnar Neuropathy Right 2004    Ulnar Neuropathy surgical revision. 2004-2008. [place].   Patient Active Problem List   Diagnosis Date Noted   Acute ischemic vertebrobasilar artery thalamic stroke, left (HCC) 03/27/2024   Essential hypertension 03/27/2024   Hypothyroidism 03/27/2024   Dyslipidemia 03/27/2024   Peripheral neuropathy 03/27/2024   Ambulatory dysfunction 03/27/2024   Hypothyroid 07/22/2020   Chronic, continuous use of opioids 04/15/2020   Pap smear abnormality of cervix/human papillomavirus (HPV) positive 08/06/2019   Aortic atherosclerosis (HCC) 10/23/2018   GERD (gastroesophageal reflux disease)    Hot flashes due to menopause 01/10/2017   Cervical spondylosis with radiculopathy 12/24/2015   Compression injury of cervical spinal nerve 10/07/2015   Left arm pain 08/29/2015   Neck pain 08/29/2015   Hematuria 07/03/2015   Allergic arthritis 06/27/2015   Arthritis 06/27/2015   Back ache 06/27/2015   Clinical depression 06/27/2015   H/O abnormal cervical Papanicolaou smear 06/27/2015   Elevated intracranial pressure 06/27/2015   Kidney stones 06/27/2015   Knee pain 06/27/2015   Abnormal mammogram 06/27/2015   Headache, migraine 06/27/2015   Neuropathy 06/27/2015   Hand paresthesia 06/27/2015   Pre-diabetes 06/27/2015   Vitamin D  deficiency 06/27/2015   Weight loss 06/27/2015   Adjustment reaction 06/27/2015   Hypercholesterolemia without hypertriglyceridemia 01/29/2010   Tobacco abuse 01/28/2010   Primary hypertension 09/16/2009   Insomnia 09/16/2009   Lesion of ulnar nerve 09/16/2009    ONSET DATE: 03/26/2024  REFERRING DIAG:  Diagnosis  R26.89 (ICD-10-CM) - Impairment of balance  Z86.73 (ICD-10-CM) - History of recent stroke  I69.30 (ICD-10-CM) - Personal history of stroke with residual effects    THERAPY DIAG:  Muscle weakness (generalized)  Other lack of coordination  Unsteadiness on feet  Rationale for Evaluation and Treatment: Rehabilitation  SUBJECTIVE:                                                                                                                                                                                              SUBJECTIVE STATEMENT: Pt reports she is still very hesitant returning to normal activities due to fear of imbalance. Pt also dealing with tough family things.  From eval: The pt is a pleasant 63 y/o female presenting to PT eval s/p CVA. Pt reports onset of symptoms 03/26/2024 where she couldn't walk. She went to ED and was found to have had a stroke.  Pt reports when she first got home she started out using a RW and about a week later switched to a cane. Currently ambulating without an AD, but still uses SPC sometimes. Sometimes she can feel unbalanced when she first stands up and can stagger. Sometimes she feels lightheaded. She says if she's outside for too long she can feel dizzy/swimmy-headed. Pt reports poor strength, including in R hand (feels tinging, she is R hand dominant). She has some difficulty standing up from sitting, she uses her arms to help her. She has some difficulty getting out of bed.  Pt feels she's kind of cautious/scared now with certain activities due to unsteadiness.    Pt accompanied by: self  PERTINENT HISTORY:   Per chart PMH includes HTN, anxiety, dyslipidemia, GERD, peripheral neuropathy, hypothyroidism  PAIN:  Are you having pain? Lower L side back pain, reports chronic sciatica and arthritis-related pain  PRECAUTIONS: None  RED FLAGS: None   WEIGHT BEARING RESTRICTIONS: No  FALLS: Has patient fallen in last 6 months? No  LIVING ENVIRONMENT: Lives with: lives with their spouse and lives with their son Lives in: House/apartment Stairs: 3 steps (bilateral handrails) Has following equipment at home: Single point cane, Environmental consultant - 2 wheeled, and shower chair  PLOF: Independent  PATIENT GOALS: I want to get back to me   OBJECTIVE:  Note: Objective measures were completed at Evaluation  unless otherwise noted.  DIAGNOSTIC FINDINGS: via chart  CT HEAD 03/27/24:  IMPRESSION: 1. No acute intracranial hemorrhage. 2. Left thalamic infarct, less well visualized than on the prior MRI. 3. No large vessel occlusion, hemodynamically significant stenosis, or aneurysm in the head or neck.   Electronically signed by: Lonni Necessary  MD 03/27/2024   MR brain 03/27/24: FINDINGS: Brain: Acute left thalamocapsular infarcts. Mild associated edema without mass effect. No midline shift. No evidence of acute hemorrhage, mass lesion, or hydrocephalus.   Vascular: Major arterial flow voids are maintained at the skull base.   Skull and upper cervical spine: Normal marrow signal.   Sinuses/Orbits: Clear sinuses.  No acute orbital findings.   Other: No mastoid effusions.   IMPRESSION: Acute left thalamocapsular infarcts.     Electronically Signed   By: Gilmore GORMAN Molt M.D.   On: 03/27/2024 00:01  COGNITION: Overall cognitive status: WFL for tasks assessed, pt does note some memory changes since her stroke    SENSATION: Reports numbness/tingling in R hand  COORDINATION: WFL LE rapid alt movement and heel>shin   EDEMA:  No swelling per pt report     POSTURE: slight increased thoracic kyphosis, elevated L shoulder     LOWER EXTREMITY MMT:    MMT Right Eval Left Eval  Hip flexion 4 4+  Hip extension    Hip abduction 4 4+  Hip adduction 4+ 4+  Hip internal rotation    Hip external rotation    Knee flexion 4+ 4+  Knee extension 5 5  Ankle dorsiflexion 4+ 4+  Ankle plantarflexion    Ankle inversion    Ankle eversion    (Blank rows = not tested)  Plantarflexion endurance test R 2 reps vs L 10 reps (somewhat limited by L knee pain)  BED MOBILITY:  Somewhat impaired per pt report since CVA  TRANSFERS: Requires use of BUE   STAIRS: Reports she is not confident descend/ascending steps without handrails  GAIT: Findings: noted slight off-loading  RLE and elevated L shoulder/side during gait   FUNCTIONAL TESTS:  5 times sit to stand: 23 seconds with use BUE to push off chair  10 meter walk test: 1.18 m/s noted slight off-loading RLE and elevated L shoulder/side during gait  Berg Balance Scale: 47/56    PATIENT SURVEYS:  ABC scale: The Activities-Specific Balance Confidence (ABC) Scale score: 52.5%                                                                                                                               TREATMENT DATE: 04/26/24   TA- To improve functional movements patterns for everyday tasks   STS 2 x 10   Standing march 2 x 10   Standing hip abduction 2 x 10   Stair practice/ training  - step taps to 6 in step 2 x 10 ea LE - step ups with UE support x 10 ea LE  - step ups without UE assist but CGA 2 x 10 ea LE   NMR: To facilitate reeducation of movement, balance, posture, coordination, and/or proprioception/kinesthetic sense.  Static balance intervention  RLE on airex other on step x 30 sec, x 3 rounds - LLE on airex for 1 x 30 sec   PATIENT EDUCATION: Education  details: exam findings, indications, goals, plan Person educated: Patient Education method: Explanation Education comprehension: verbalized understanding  HOME EXERCISE PROGRAM: Access Code: [facility] URL: [email or web address] Date: 04/26/2024 Prepared by: Lonni Gainer  Exercises - Sit to Stand  - 1 x daily - 7 x weekly - 2 sets - 10 reps - Marching Near Counter  - 1 x daily - 7 x weekly - 2 sets - 10 reps - Standing Hip Abduction with Unilateral Counter Support  - 1 x daily - 7 x weekly - 2 sets - 10 reps  GOALS: Goals reviewed with patient? Yes   SHORT TERM GOALS: Target date: 05/31/2024    Patient will be independent in home exercise program to improve strength/mobility for better functional independence with ADLs. Baseline: Goal status: INITIAL   LONG TERM GOALS: Target date:  07/12/2024    Patient will increase ABC scale score >80% to demonstrate better functional mobility and better confidence with ADLs.  Baseline: 52.5% Goal status: INITIAL  2.  Patient (> 74 years old) will complete five times sit to stand test in < 15 seconds indicating an increased LE strength and improved balance. Baseline: 23 sec with use of BUE Goal status: INITIAL  3.  Patient will increase Berg Balance score by > 6 points to demonstrate decreased fall risk during functional activities Baseline: 47/56 Goal status: INITIAL  4.  Patient will increase 10 meter walk test to >1.3 m/s as to improve gait speed for better community ambulation. Baseline: 1.18 m/s Goal status: INITIAL     ASSESSMENT:  CLINICAL IMPRESSION:  Patient arrived with good motivation for completion of pt activities. Pt introduced to initial HEP to improve strength and functional movements. Pt practiced with stair training and showed marked improvement with practice. Pt will continue to benefit from skilled physical therapy intervention to address impairments, improve QOL, and attain therapy goals.    OBJECTIVE IMPAIRMENTS: Abnormal gait, decreased balance, decreased mobility, decreased strength, impaired sensation, improper body mechanics, postural dysfunction, and pain.   ACTIVITY LIMITATIONS: bending, bed mobility, and locomotion level  PARTICIPATION LIMITATIONS: meal prep, cleaning, shopping, community activity, and yard work  PERSONAL FACTORS: Sex and 3+ comorbidities: Per chart PMH includes HTN, anxiety, dyslipidemia, GERD, peripheral neuropathy, hypothyroidism are also affecting patient's functional outcome.   REHAB POTENTIAL: Good  CLINICAL DECISION MAKING: Evolving/moderate complexity  EVALUATION COMPLEXITY: Moderate  PLAN:  PT FREQUENCY: 1-2x/week  PT DURATION: 12 weeks  PLANNED INTERVENTIONS: [id number]- PT Re-evaluation, 97750- Physical Performance Testing, 97110-Therapeutic exercises, 97530-  Therapeutic activity, [id number]- Neuromuscular re-education, 97535- Self Care, [id number]- Manual therapy, [phone]- Gait training, [phone]- Orthotic Initial, [phone]- Canalith repositioning, Patient/Family education, Balance training, Stair training, Joint mobilization, Spinal mobilization, Vestibular training, DME instructions, Cryotherapy, and Moist heat  PLAN FOR NEXT SESSION: balance, strength, initiate HEP    Lonni KATHEE Gainer, PT 04/26/2024, 1:56 PM

## 2024-04-30 ENCOUNTER — Ambulatory Visit (INDEPENDENT_AMBULATORY_CARE_PROVIDER_SITE_OTHER): Admitting: Physician Assistant

## 2024-04-30 ENCOUNTER — Encounter: Payer: Self-pay | Admitting: Physician Assistant

## 2024-04-30 VITALS — BP 160/88 | HR 68 | Temp 98.4°F | Resp 16 | Ht 65.0 in | Wt 176.0 lb

## 2024-04-30 DIAGNOSIS — E782 Mixed hyperlipidemia: Secondary | ICD-10-CM

## 2024-04-30 DIAGNOSIS — I10 Essential (primary) hypertension: Secondary | ICD-10-CM | POA: Diagnosis not present

## 2024-04-30 DIAGNOSIS — E039 Hypothyroidism, unspecified: Secondary | ICD-10-CM

## 2024-04-30 DIAGNOSIS — Z8673 Personal history of transient ischemic attack (TIA), and cerebral infarction without residual deficits: Secondary | ICD-10-CM

## 2024-04-30 MED ORDER — LEVOTHYROXINE SODIUM 50 MCG PO TABS: 50.0000 ug | ORAL_TABLET | Freq: Every day | ORAL | 3 refills | Status: AC

## 2024-04-30 MED ORDER — LISINOPRIL 40 MG PO TABS: 40.0000 mg | ORAL_TABLET | Freq: Every day | ORAL | 2 refills | Status: DC

## 2024-04-30 MED ORDER — ASPIRIN 81 MG PO TBEC
81.0000 mg | DELAYED_RELEASE_TABLET | Freq: Every day | ORAL | 11 refills | Status: AC
Start: 2024-04-30 — End: 2025-04-30

## 2024-04-30 MED ORDER — HYDRALAZINE HCL 50 MG PO TABS: 50.0000 mg | ORAL_TABLET | Freq: Three times a day (TID) | ORAL | 1 refills | Status: DC | PRN

## 2024-04-30 MED ORDER — ATORVASTATIN CALCIUM 80 MG PO TABS
80.0000 mg | ORAL_TABLET | Freq: Every day | ORAL | 11 refills | Status: AC
Start: 2024-04-30 — End: 2025-04-30

## 2024-04-30 NOTE — Progress Notes (Signed)
 [facility] [address] [place], [place] [address]  Internal MEDICINE  Office Visit Note  Patient Name: Jamie Williams  [id number]  [id number]  Date of Service: 04/30/2024  Chief Complaint  Patient presents with   Follow-up   Hypertension   Gastroesophageal Reflux   Hyperlipidemia   Quality Metric Gaps    Colonoscopy and TDAP   Medication Refill    HPI Pt is here for routine follow up -ran out of meds last night so has been without meds today, would like them to be sent [facility] -BP at home 150/60 before hydralazine   then would go to 140 typically. Had been taking typically BID not TID -does have a dry cough for the last week, discussed possibility of increased lisinopril  as culprit and will monitor. May need to change [facility] stopped her amlodipine  and hydrochlorothiazide  during stay. Also states her synthroid  was stopped and this was removed from med list however it does state to continue to plan and last labs prior to hospitalization were normal on her medication. Will resend and plan to recheck labs in a few months -doing PT and OT to help right hand and balance since stroke. Has not had neurology appt and doesn't appear this was set up from discharge Can't see ortho right now--states she has an open balance -will be going to [facility] for further evaluation of back though -BP elevated on recheck as well  Current Medication: Outpatient Encounter Medications as of 04/30/2024  Medication Sig   cholecalciferol  (VITAMIN D ) 1000 units tablet Take 2,000 Units by mouth daily.    colchicine  0.6 MG tablet Take 2 tablets (1.2 mg) on first day of gout flare, then 1 tablet daily   cyclobenzaprine  (FLEXERIL ) 10 MG tablet Take 1 tablet (10 mg total) by mouth 3 (three) times daily as needed for muscle spasms. Take one tab po qhs for back spasm prn only   erythromycin  ophthalmic ointment Place 1 Application into the left eye at bedtime.    escitalopram  (LEXAPRO ) 10 MG tablet Take 1 tablet (10 mg total) by mouth daily.   levothyroxine  (SYNTHROID ) 50 MCG tablet Take 1 tablet (50 mcg total) by mouth daily.   nicotine  (NICODERM CQ  - DOSED IN MG/24 HR) 7 mg/24hr patch PLACE 1 PATCH ONTO THE SKIN DAILY   pantoprazole  (PROTONIX ) 40 MG tablet Take 1 tablet (40 mg total) by mouth daily.   promethazine  (PHENERGAN ) 25 MG tablet TAKE 1 TABLET(25 MG) BY MOUTH EVERY 6 HOURS AS NEEDED FOR NAUSEA OR VOMITING   traMADol  (ULTRAM ) 50 MG tablet Take 1/2 -1 tablet by mouth daily as needed for pain   [DISCONTINUED] aspirin  EC 81 MG tablet Take 1 tablet (81 mg total) by mouth daily. Swallow whole.   [DISCONTINUED] atorvastatin  (LIPITOR) 80 MG tablet Take 1 tablet (80 mg total) by mouth at bedtime.   [DISCONTINUED] hydrALAZINE  (APRESOLINE ) 50 MG tablet Take 1 tablet (50 mg total) by mouth 3 (three) times daily as needed (If systolic BP greater than 150 mmHg.). If systolic BP greater than 150 mmHg.   [DISCONTINUED] lisinopril  (ZESTRIL ) 40 MG tablet Take 1 tablet (40 mg total) by mouth daily.   aspirin  EC 81 MG tablet Take 1 tablet (81 mg total) by mouth daily. Swallow whole.   atorvastatin  (LIPITOR) 80 MG tablet Take 1 tablet (80 mg total) by mouth at bedtime.   hydrALAZINE  (APRESOLINE ) 50 MG tablet Take 1 tablet (50 mg total) by mouth 3 (three) times daily as needed (  If systolic BP greater than 150 mmHg.). If systolic BP greater than 150 mmHg.   lisinopril  (ZESTRIL ) 40 MG tablet Take 1 tablet (40 mg total) by mouth daily.   No facility-administered encounter medications on file as of 04/30/2024.    Surgical History: Past Surgical History:  Procedure Laterality Date   ANTERIOR CERVICAL DECOMP/DISCECTOMY FUSION N/A 12/24/2015   Procedure: Cervical five-six, Cervical six-seven  Anterior cervical decompression/diskectomy/fusion/interbody prosthesis/plate;  Surgeon: Reyes Budge, MD;  Location: [facility] NEURO [facility]S;  Service: Neurosurgery;  Laterality: N/A;    COLONOSCOPY WITH PROPOFOL  N/A 07/17/2018   Procedure: COLONOSCOPY WITH PROPOFOL ;  Surgeon: Therisa Bi, MD;  Location: [facility] ENDOSCOPY;  Service: Gastroenterology;  Laterality: N/A;   ECTOPIC PREGNANCY SURGERY  1991 and 1996   two Etopic preg.   Head Injuries  2006   surgery 2006 Cram/NS in [place]. Headaches with increased intracranial pressure. Repeat MRI in 2008 Negative   SUPRACERVICAL ABDOMINAL HYSTERECTOMY  2011   Abdominal; Ovaries intact; CERVIX INTACT. Fibroids/dys menorrhea. Weaver-Lee   Ulnar Neuropathy Right 2004   Ulnar Neuropathy surgical revision. 2004-2008. [place].    Medical History: Past Medical History:  Diagnosis Date   Anxiety    Family history of adverse reaction to anesthesia    sister had a difficult time waking up from mastectomy and passed away    GERD (gastroesophageal reflux disease)    History of chicken pox    History of kidney stones    Hyperlipidemia    Hypertension    PONV (postoperative nausea and vomiting)    nausea   Sciatic nerve pain     Family History: Family History  Problem Relation Age of Onset   Hypertension Mother    Diabetes Mother        Type 2   Breast cancer Mother 14   Cancer Sister 45       Breast   Diabetes Sister        type 2   Breast cancer Sister 35   Breast cancer Sister    Diabetes Sister    Diabetes Sister    Diabetes Sister        type 2   Liver cancer Brother    Stomach cancer Brother     Social History   Socioeconomic History   Marital status: Married    Spouse name: Not on file   Number of children: 1   Years of education: Not on file   Highest education level: 12th grade  Occupational History   Occupation: disabled  Tobacco Use   Smoking status: Every Day    Current packs/day: 0.50    Types: Cigarettes   Smokeless tobacco: Never   Tobacco comments:    5 cigarettes daily.  Vaping Use   Vaping status: Never Used  Substance and Sexual Activity   Alcohol use: Not Currently     Alcohol/week: 0.0 standard drinks of alcohol   Drug use: Not Currently   Sexual activity: Yes    Birth control/protection: None  Other Topics Concern   Not on file  Social History Narrative   Not on file   Social Drivers of Health   Financial Resource Strain: Low Risk  (08/24/2023)   Overall Financial Resource Strain (CARDIA)    Difficulty of Paying Living Expenses: Not hard at all  Food Insecurity: No Food Insecurity (03/27/2024)   Hunger Vital Sign    Worried About Running Out of Food in the Last Year: Never true    Ran Out of Food  in the Last Year: Never true  Transportation Needs: No Transportation Needs (03/27/2024)   PRAPARE - Administrator, Civil Service (Medical): No    Lack of Transportation (Non-Medical): No  Physical Activity: Insufficiently Active (08/24/2023)   Exercise Vital Sign    Days of Exercise per Week: 1 day    Minutes of Exercise per Session: 10 min  Stress: Stress Concern Present (08/24/2023)   [employer] of Occupational Health - Occupational Stress Questionnaire    Feeling of Stress : To some extent  Social Connections: Moderately Isolated (08/24/2023)   Social Connection and Isolation Panel    Frequency of Communication with Friends and Family: Once a week    Frequency of Social Gatherings with Friends and Family: Once a week    Attends Religious Services: 1 to 4 times per year    Active Member of [employer] or Organizations: No    Attends Banker Meetings: Never    Marital Status: Married  Catering manager Violence: Not At Risk (03/27/2024)   Humiliation, Afraid, Rape, and Kick questionnaire    Fear of Current or Ex-Partner: No    Emotionally Abused: No    Physically Abused: No    Sexually Abused: No      Review of Systems  Constitutional:  Negative for chills, fatigue and unexpected weight change.  HENT:  Negative for congestion, rhinorrhea, sneezing and sore throat.   Eyes:  Negative for redness.  Respiratory:   Negative for cough, chest tightness and shortness of breath.   Cardiovascular:  Negative for chest pain and palpitations.  Gastrointestinal:  Negative for vomiting.  Genitourinary:  Negative for dysuria and frequency.  Musculoskeletal:  Positive for arthralgias and back pain. Negative for gait problem, joint swelling and neck pain.  Skin:  Negative for rash.  Neurological:  Positive for weakness. Negative for tremors.  Hematological:  Negative for adenopathy. Does not bruise/bleed easily.  Psychiatric/Behavioral:  Negative for behavioral problems (Depression) and suicidal ideas. The patient is not nervous/anxious.     Vital Signs: BP (!) 160/88   Pulse 68   Temp 98.4 F (36.9 C)   Resp 16   Ht 5' 5 (1.651 m)   Wt 176 lb (79.8 kg)   SpO2 98%   BMI 29.29 kg/m    Physical Exam Vitals and nursing note reviewed.  Constitutional:      Appearance: Normal appearance.  HENT:     Head: Normocephalic and atraumatic.  Eyes:     Extraocular Movements: Extraocular movements intact.  Cardiovascular:     Rate and Rhythm: Normal rate and regular rhythm.  Pulmonary:     Effort: Pulmonary effort is normal.     Breath sounds: Normal breath sounds. No wheezing.  Musculoskeletal:     Right lower leg: No edema.     Left lower leg: No edema.  Skin:    General: Skin is warm and dry.  Neurological:     General: No focal deficit present.     Mental Status: She is alert.  Psychiatric:        Mood and Affect: Mood normal.        Behavior: Behavior normal.        Assessment/Plan: 1. Primary hypertension (Primary) Bp elevated but ran out of meds and will resend now. May take hydralazine  up to TID  as needed. Will monitor and may need further adjustment esp if dry cough persists as possible S/E of ACEI - lisinopril  (ZESTRIL ) 40 MG tablet; Take  1 tablet (40 mg total) by mouth daily.  Dispense: 30 tablet; Refill: 2  2. Hypothyroidism, unspecified type Restart and will recheck labs in a few  months - levothyroxine  (SYNTHROID ) 50 MCG tablet; Take 1 tablet (50 mcg total) by mouth daily.  Dispense: 90 tablet; Refill: 3  3. History of stroke - Ambulatory referral to Neurology  4. Mixed hyperlipidemia - atorvastatin  (LIPITOR) 80 MG tablet; Take 1 tablet (80 mg total) by mouth at bedtime.  Dispense: 30 tablet; Refill: 11   General Counseling: Reena oakland understanding of the findings of todays visit and agrees with plan of treatment. I have discussed any further diagnostic evaluation that may be needed or ordered today. We also reviewed her medications today. she has been encouraged to call the office with any questions or concerns that should arise related to todays visit.    Orders Placed This Encounter  Procedures   Ambulatory referral to Neurology    Meds ordered this encounter  Medications   atorvastatin  (LIPITOR) 80 MG tablet    Sig: Take 1 tablet (80 mg total) by mouth at bedtime.    Dispense:  30 tablet    Refill:  11   hydrALAZINE  (APRESOLINE ) 50 MG tablet    Sig: Take 1 tablet (50 mg total) by mouth 3 (three) times daily as needed (If systolic BP greater than 150 mmHg.). If systolic BP greater than 150 mmHg.    Dispense:  30 tablet    Refill:  1   lisinopril  (ZESTRIL ) 40 MG tablet    Sig: Take 1 tablet (40 mg total) by mouth daily.    Dispense:  30 tablet    Refill:  2    Discontinue lisinopril  20 mg, note increased dose, fill new script today   aspirin  EC 81 MG tablet    Sig: Take 1 tablet (81 mg total) by mouth daily. Swallow whole.    Dispense:  30 tablet    Refill:  11   levothyroxine  (SYNTHROID ) 50 MCG tablet    Sig: Take 1 tablet (50 mcg total) by mouth daily.    Dispense:  90 tablet    Refill:  3    This patient was seen by Tinnie Pro, PA-C in collaboration with Dr. Sigrid Bathe as a part of collaborative care agreement.   Total time spent:30 Minutes Time spent includes review of chart, medications, test results, and follow up plan  with the patient.      Dr Fozia M Khan Internal medicine

## 2024-05-01 ENCOUNTER — Ambulatory Visit: Admitting: Occupational Therapy

## 2024-05-01 ENCOUNTER — Ambulatory Visit: Admitting: Physical Therapy

## 2024-05-03 ENCOUNTER — Ambulatory Visit: Admitting: Physical Therapy

## 2024-05-03 ENCOUNTER — Telehealth: Payer: Self-pay | Admitting: Physician Assistant

## 2024-05-03 ENCOUNTER — Ambulatory Visit

## 2024-05-03 NOTE — Telephone Encounter (Signed)
Neurology referral sent via [facility] to [facility] 

## 2024-05-08 ENCOUNTER — Ambulatory Visit: Admitting: Occupational Therapy

## 2024-05-08 ENCOUNTER — Ambulatory Visit

## 2024-05-09 ENCOUNTER — Encounter: Payer: Self-pay | Admitting: Emergency Medicine

## 2024-05-09 ENCOUNTER — Emergency Department

## 2024-05-09 ENCOUNTER — Emergency Department
Admission: EM | Admit: 2024-05-09 | Discharge: 2024-05-09 | Disposition: A | Discharge status: Home / Self Care | Attending: Emergency Medicine | Admitting: Emergency Medicine

## 2024-05-09 ENCOUNTER — Other Ambulatory Visit: Payer: Self-pay

## 2024-05-09 DIAGNOSIS — I10 Essential (primary) hypertension: Secondary | ICD-10-CM | POA: Insufficient documentation

## 2024-05-09 DIAGNOSIS — R112 Nausea with vomiting, unspecified: Secondary | ICD-10-CM | POA: Insufficient documentation

## 2024-05-09 DIAGNOSIS — D72829 Elevated white blood cell count, unspecified: Secondary | ICD-10-CM | POA: Insufficient documentation

## 2024-05-09 DIAGNOSIS — Z0389 Encounter for observation for other suspected diseases and conditions ruled out: Secondary | ICD-10-CM | POA: Diagnosis not present

## 2024-05-09 DIAGNOSIS — R519 Headache, unspecified: Secondary | ICD-10-CM | POA: Insufficient documentation

## 2024-05-09 DIAGNOSIS — E860 Dehydration: Secondary | ICD-10-CM

## 2024-05-09 DIAGNOSIS — E876 Hypokalemia: Secondary | ICD-10-CM | POA: Diagnosis not present

## 2024-05-09 DIAGNOSIS — R1013 Epigastric pain: Secondary | ICD-10-CM | POA: Diagnosis not present

## 2024-05-09 DIAGNOSIS — I6381 Other cerebral infarction due to occlusion or stenosis of small artery: Secondary | ICD-10-CM | POA: Diagnosis not present

## 2024-05-09 DIAGNOSIS — K219 Gastro-esophageal reflux disease without esophagitis: Secondary | ICD-10-CM

## 2024-05-09 LAB — COMPREHENSIVE METABOLIC PANEL WITH GFR
ALT: 12 U/L (ref 0–44)
AST: 21 U/L (ref 15–41)
Albumin: 4.2 g/dL (ref 3.5–5.0)
Alkaline Phosphatase: 129 U/L — ABNORMAL HIGH (ref 38–126)
Anion gap: 12 (ref 5–15)
BUN: 10 mg/dL (ref 8–23)
CO2: 23 mmol/L (ref 22–32)
Calcium: 9.9 mg/dL (ref 8.9–10.3)
Chloride: 105 mmol/L (ref 98–111)
Creatinine, Ser: 0.82 mg/dL (ref 0.44–1.00)
GFR, Estimated: >60 mL/min (ref >60)
Glucose, Bld: 173 mg/dL — ABNORMAL HIGH (ref 70–99)
Potassium: 4 mmol/L (ref 3.5–5.1)
Sodium: 140 mmol/L (ref 135–145)
Total Bilirubin: 1.2 mg/dL (ref 0.0–1.2)
Total Protein: 7.6 g/dL (ref 6.5–8.1)

## 2024-05-09 LAB — CBC WITH DIFFERENTIAL/PLATELET
Abs Immature Granulocytes: 0.05 K/uL (ref 0.00–0.07)
Basophils Absolute: 0.1 K/uL (ref 0.0–0.1)
Basophils Relative: 1 %
Eosinophils Absolute: 0.2 K/uL (ref 0.0–0.5)
Eosinophils Relative: 1 %
HCT: 45.7 % (ref 36.0–46.0)
Hemoglobin: 14.8 g/dL (ref 12.0–15.0)
Immature Granulocytes: 0 %
Lymphocytes Relative: 29 %
Lymphs Abs: 4.1 K/uL — ABNORMAL HIGH (ref 0.7–4.0)
MCH: 28.4 pg (ref 26.0–34.0)
MCHC: 32.4 g/dL (ref 30.0–36.0)
MCV: 87.5 fL (ref 80.0–100.0)
Monocytes Absolute: 1.1 K/uL — ABNORMAL HIGH (ref 0.1–1.0)
Monocytes Relative: 8 %
Neutro Abs: 8.3 K/uL — ABNORMAL HIGH (ref 1.7–7.7)
Neutrophils Relative %: 61 %
Platelets: 358 K/uL (ref 150–400)
RBC: 5.22 MIL/uL — ABNORMAL HIGH (ref 3.87–5.11)
RDW: 13.2 % (ref 11.5–15.5)
WBC: 13.8 K/uL — ABNORMAL HIGH (ref 4.0–10.5)
nRBC: 0 % (ref 0.0–0.2)

## 2024-05-09 LAB — TROPONIN I (HIGH SENSITIVITY)
Troponin I (High Sensitivity): 5 ng/L (ref <18)
Troponin I (High Sensitivity): 5 ng/L (ref <18)

## 2024-05-09 LAB — LIPASE, BLOOD: Lipase: 56 U/L — ABNORMAL HIGH (ref 11–51)

## 2024-05-09 MED ORDER — SODIUM CHLORIDE 0.9 % IV BOLUS
1000.0000 mL | Freq: Once | INTRAVENOUS | Status: AC
  Administered 2024-05-09: 1000 mL via INTRAVENOUS

## 2024-05-09 MED ORDER — ONDANSETRON HCL 4 MG/2ML IJ SOLN
4.0000 mg | Freq: Once | INTRAMUSCULAR | Status: AC
  Administered 2024-05-09: 4 mg via INTRAVENOUS
  Filled 2024-05-09: qty 2

## 2024-05-09 MED ORDER — LIDOCAINE VISCOUS HCL 2 % MT SOLN
15.0000 mL | Freq: Once | OROMUCOSAL | Status: AC
  Administered 2024-05-09: 15 mL via ORAL
  Filled 2024-05-09: qty 15

## 2024-05-09 MED ORDER — ALUM & MAG HYDROXIDE-SIMETH 200-200-20 MG/5ML PO SUSP
30.0000 mL | Freq: Once | ORAL | Status: AC
  Administered 2024-05-09: 30 mL via ORAL
  Filled 2024-05-09: qty 30

## 2024-05-09 MED ORDER — ONDANSETRON 4 MG PO TBDP: 4.0000 mg | ORAL_TABLET | Freq: Three times a day (TID) | ORAL | 0 refills | Status: DC | PRN

## 2024-05-09 MED ORDER — SUCRALFATE 1 GM/10ML PO SUSP: 1.0000 g | Freq: Three times a day (TID) | ORAL | 1 refills | Status: AC | PRN

## 2024-05-09 MED ORDER — PANTOPRAZOLE SODIUM 40 MG IV SOLR
80.0000 mg | Freq: Once | INTRAVENOUS | Status: AC
  Administered 2024-05-09: 80 mg via INTRAVENOUS
  Filled 2024-05-09: qty 20

## 2024-05-09 MED ORDER — IOHEXOL 300 MG/ML  SOLN
100.0000 mL | Freq: Once | INTRAMUSCULAR | Status: AC | PRN
  Administered 2024-05-09: 100 mL via INTRAVENOUS

## 2024-05-09 MED ORDER — PANTOPRAZOLE SODIUM 40 MG PO TBEC: 40.0000 mg | DELAYED_RELEASE_TABLET | Freq: Every day | ORAL | 11 refills | Status: AC

## 2024-05-09 MED ORDER — MORPHINE SULFATE (PF) 4 MG/ML IV SOLN
4.0000 mg | Freq: Once | INTRAVENOUS | Status: AC
  Administered 2024-05-09: 4 mg via INTRAVENOUS
  Filled 2024-05-09: qty 1

## 2024-05-09 NOTE — ED Triage Notes (Addendum)
 Patient ambulatory to triage with steady gait, appears uncomfortable, tearful; pt st x 3 days having generalized HA and pain mid epigastric pain radiating under left breast accomp by nausea; st hx migraines; tramadol  and promethazine  at 1130pm without relief

## 2024-05-09 NOTE — ED Provider Notes (Signed)
 [facility] Provider Note    Event Date/Time   First MD Initiated Contact with Patient 05/09/24 0309     (approximate)   History   Headache and Abdominal Pain   HPI  Jamie Williams is a 63 y.o. female   Past medical history of GERD, hypertension hyperlipidemia presents to the emergency department with epigastric pain.  Started on Sunday 3 days ago mild but increasingly has gotten more severe over the last 3 days.  Has vomited twice.  No GI bleeding.  Minimal bowel movements but passing flatus.  No urinary symptoms.  No chest pain.  No shortness of breath.  No fevers or chills.  Does take aspirin  but no other NSAIDs.  On Sunday after her pain started she did have a mechanical slip and fall falling forward but no head strike loss of consciousness or major injuries.  She then developed a mild headache over the last couple of days.   External Medical Documents Reviewed: Outpatient office visit from earlier this month      Physical Exam   Triage Vital Signs: ED Triage Vitals  Encounter Vitals Group     BP 05/09/24 0252 (!) 161/110     Girls Systolic BP Percentile --      Girls Diastolic BP Percentile --      Boys Systolic BP Percentile --      Boys Diastolic BP Percentile --      Pulse Rate 05/09/24 0252 81     Resp 05/09/24 0252 19     Temp 05/09/24 0252 98.4 F (36.9 C)     Temp src --      SpO2 05/09/24 0252 100 %     Weight 05/09/24 0244 176 lb (79.8 kg)     Height 05/09/24 0244 5' 5 (1.651 m)     Head Circumference --      Peak Flow --      Pain Score 05/09/24 0244 10     Pain Loc --      Pain Education --      Exclude from Growth Chart --     Most recent vital signs: Vitals:   05/09/24 0252  BP: (!) 161/110  Pulse: 81  Resp: 19  Temp: 98.4 F (36.9 C)  SpO2: 100%    General: Awake, no distress.  CV:  Good peripheral perfusion.  Resp:  Normal effort.  Abd:  No distention.  Other:  Tearful uncomfortable appearing  hypertensive otherwise vital signs normal.  Neck supple forage motion.  No obvious signs of head trauma.  She has epigastric tenderness to palpation with guarding.  The rest of her abdomen is soft and no focal tenderness and no rigidity nonperitoneal at this time.   ED Results / Procedures / Treatments   Labs (all labs ordered are listed, but only abnormal results are displayed) Labs Reviewed  CBC WITH DIFFERENTIAL/PLATELET - Abnormal; Notable for the following components:      Result Value   WBC 13.8 (*)    RBC 5.22 (*)    Neutro Abs 8.3 (*)    Lymphs Abs 4.1 (*)    Monocytes Absolute 1.1 (*)    All other components within normal limits  COMPREHENSIVE METABOLIC PANEL WITH GFR - Abnormal; Notable for the following components:   Glucose, Bld 173 (*)    Alkaline Phosphatase 129 (*)    All other components within normal limits  LIPASE, BLOOD - Abnormal; Notable for the following components:  Lipase 56 (*)    All other components within normal limits  URINALYSIS, ROUTINE W REFLEX MICROSCOPIC  TROPONIN I (HIGH SENSITIVITY)     I ordered and reviewed the above labs they are notable for white blood cell count of 13.8.  EKG  ED ECG REPORT I, Ginnie Shams, the attending physician, personally viewed and interpreted this ECG.   Date: 05/09/2024  EKG Time: 0250  Rate: 81  Rhythm: sinus  Axis: nl  Intervals:nl  ST&T Change: no stemi    RADIOLOGY I independently reviewed and interpreted chest x-ray I see no intra-abdominal free air I also reviewed radiologist's formal read.   PROCEDURES:  Critical Care performed: No  Procedures   MEDICATIONS ORDERED IN ED: Medications  pantoprazole  (PROTONIX ) injection 80 mg (has no administration in time range)  morphine  (PF) 4 MG/ML injection 4 mg (has no administration in time range)  sodium chloride  0.9 % bolus 1,000 mL (has no administration in time range)  ondansetron  (ZOFRAN ) injection 4 mg (has no administration in time range)      IMPRESSION / MDM / ASSESSMENT AND PLAN / ED COURSE  I reviewed the triage vital signs and the nursing notes.                                Patient's presentation is most consistent with acute presentation with potential threat to life or bodily function.  Differential diagnosis includes, but is not limited to, gastritis/GERD/ulcer/perforated ulcer, pancreatitis, biliary colic or cholecystitis, dehydration or electrolyte disturbance, considered head injury ICH considered but less likely ACS PE dissection   The patient is on the cardiac monitor to evaluate for evidence of arrhythmia and/or significant heart rate changes.  MDM:    I am concerned about her epigastric pain with guarding on palpation may represent perforation or ulcer or pancreatitis -we will give IV antiemetic, IV morphine , fluids, IV Protonix  and CT of the abdomen pelvis for further evaluation.  Given the tenderness to the epigastrium I doubt cardiopulmonary emergencies like ACS dissection PE.  I think that her headache that came along after her abdominal pain and nausea and vomiting is likely due to dehydration as it is mild.  She had a slip and fall coincidentally on Sunday but no head strike so I doubt this is trauma related but will get a CT scan in the setting of fall, headache, vomiting rule out intracranial pathologies low clinical suspicion at this time.   Workup unremarkable and patient is feeling markedly better.  Plan for discharge with GI follow-up, PPI.  Return precautions given.     FINAL CLINICAL IMPRESSION(S) / ED DIAGNOSES   Final diagnoses:  Epigastric pain  Nausea and vomiting, unspecified vomiting type  Dehydration  Nonintractable headache, unspecified chronicity pattern, unspecified headache type     Rx / DC Orders   ED Discharge Orders     None        Note:  This document was prepared using Dragon voice recognition software and may include unintentional dictation errors.     Shams Ginnie, MD 05/09/24 [phone]

## 2024-05-09 NOTE — Discharge Instructions (Signed)
 Fortunately your evaluation in the Emergency Department did not show any emergency conditions that accounted for your pain.  I think this is likely due to GERD or gastritis or a gastric ulcer which can be caused by stomach acid.  I refilled your previous prescription of Protonix  which is an acid reduction medication that can help with your pain.  Make sure to take this daily as prescribed.  Use Carafate  as needed for pain as well.  Reduce the use of NSAIDs like Motrin , Advil , ibuprofen , Aleve, Naprosyn as these medications can irritate your stomach further.  Avoid excessive alcohol use as this can irritate your stomach as well.  I made a referral to a gastroenterologist who should call you to schedule an appointment.  Thank you for choosing us  for your health care today!  Please see your primary doctor this week for a follow up appointment.   If you have any new, worsening, or unexpected symptoms call your doctor right away or come back to the emergency department for reevaluation.  It was my pleasure to care for you today.   Ginnie EDISON Cyrena, MD

## 2024-05-10 ENCOUNTER — Ambulatory Visit: Admitting: Occupational Therapy

## 2024-05-10 ENCOUNTER — Ambulatory Visit

## 2024-05-13 DIAGNOSIS — Z8673 Personal history of transient ischemic attack (TIA), and cerebral infarction without residual deficits: Secondary | ICD-10-CM | POA: Diagnosis not present

## 2024-05-13 DIAGNOSIS — F17210 Nicotine dependence, cigarettes, uncomplicated: Secondary | ICD-10-CM | POA: Diagnosis present

## 2024-05-13 DIAGNOSIS — Z79890 Hormone replacement therapy (postmenopausal): Secondary | ICD-10-CM

## 2024-05-13 DIAGNOSIS — Z720 Tobacco use: Secondary | ICD-10-CM | POA: Diagnosis not present

## 2024-05-13 DIAGNOSIS — Z7982 Long term (current) use of aspirin: Secondary | ICD-10-CM

## 2024-05-13 DIAGNOSIS — I10 Essential (primary) hypertension: Secondary | ICD-10-CM | POA: Diagnosis not present

## 2024-05-13 DIAGNOSIS — Z981 Arthrodesis status: Secondary | ICD-10-CM | POA: Diagnosis not present

## 2024-05-13 DIAGNOSIS — K8500 Idiopathic acute pancreatitis without necrosis or infection: Principal | ICD-10-CM | POA: Diagnosis present

## 2024-05-13 DIAGNOSIS — I700 Atherosclerosis of aorta: Secondary | ICD-10-CM | POA: Diagnosis present

## 2024-05-13 DIAGNOSIS — F32A Depression, unspecified: Secondary | ICD-10-CM | POA: Diagnosis not present

## 2024-05-13 DIAGNOSIS — M109 Gout, unspecified: Secondary | ICD-10-CM | POA: Diagnosis present

## 2024-05-13 DIAGNOSIS — F419 Anxiety disorder, unspecified: Secondary | ICD-10-CM | POA: Diagnosis not present

## 2024-05-13 DIAGNOSIS — R1013 Epigastric pain: Secondary | ICD-10-CM | POA: Diagnosis not present

## 2024-05-13 DIAGNOSIS — Z880 Allergy status to penicillin: Secondary | ICD-10-CM

## 2024-05-13 DIAGNOSIS — Z8249 Family history of ischemic heart disease and other diseases of the circulatory system: Secondary | ICD-10-CM

## 2024-05-13 DIAGNOSIS — Z888 Allergy status to other drugs, medicaments and biological substances status: Secondary | ICD-10-CM

## 2024-05-13 DIAGNOSIS — Z833 Family history of diabetes mellitus: Secondary | ICD-10-CM

## 2024-05-13 DIAGNOSIS — Z79899 Other long term (current) drug therapy: Secondary | ICD-10-CM

## 2024-05-13 DIAGNOSIS — K760 Fatty (change of) liver, not elsewhere classified: Secondary | ICD-10-CM | POA: Diagnosis not present

## 2024-05-13 DIAGNOSIS — K429 Umbilical hernia without obstruction or gangrene: Secondary | ICD-10-CM | POA: Diagnosis not present

## 2024-05-13 DIAGNOSIS — Z882 Allergy status to sulfonamides status: Secondary | ICD-10-CM | POA: Diagnosis not present

## 2024-05-13 DIAGNOSIS — E785 Hyperlipidemia, unspecified: Secondary | ICD-10-CM | POA: Diagnosis not present

## 2024-05-13 DIAGNOSIS — K219 Gastro-esophageal reflux disease without esophagitis: Secondary | ICD-10-CM | POA: Diagnosis present

## 2024-05-13 DIAGNOSIS — F1190 Opioid use, unspecified, uncomplicated: Secondary | ICD-10-CM | POA: Diagnosis not present

## 2024-05-13 DIAGNOSIS — E039 Hypothyroidism, unspecified: Secondary | ICD-10-CM | POA: Diagnosis not present

## 2024-05-13 DIAGNOSIS — Z79891 Long term (current) use of opiate analgesic: Secondary | ICD-10-CM

## 2024-05-13 DIAGNOSIS — K859 Acute pancreatitis without necrosis or infection, unspecified: Secondary | ICD-10-CM | POA: Diagnosis not present

## 2024-05-13 DIAGNOSIS — Z90710 Acquired absence of both cervix and uterus: Secondary | ICD-10-CM | POA: Diagnosis not present

## 2024-05-13 DIAGNOSIS — R1011 Right upper quadrant pain: Secondary | ICD-10-CM | POA: Diagnosis not present

## 2024-05-14 ENCOUNTER — Inpatient Hospital Stay

## 2024-05-14 ENCOUNTER — Inpatient Hospital Stay
Admission: EM | Admit: 2024-05-14 | Discharge: 2024-05-16 | DRG: 440 | Disposition: A | Discharge status: Home / Self Care | Attending: Family Medicine | Admitting: Family Medicine

## 2024-05-14 ENCOUNTER — Other Ambulatory Visit: Payer: Self-pay

## 2024-05-14 ENCOUNTER — Emergency Department

## 2024-05-14 DIAGNOSIS — Z720 Tobacco use: Secondary | ICD-10-CM | POA: Diagnosis present

## 2024-05-14 DIAGNOSIS — Z79890 Hormone replacement therapy (postmenopausal): Secondary | ICD-10-CM | POA: Diagnosis not present

## 2024-05-14 DIAGNOSIS — E039 Hypothyroidism, unspecified: Secondary | ICD-10-CM

## 2024-05-14 DIAGNOSIS — Z8249 Family history of ischemic heart disease and other diseases of the circulatory system: Secondary | ICD-10-CM | POA: Diagnosis not present

## 2024-05-14 DIAGNOSIS — Z833 Family history of diabetes mellitus: Secondary | ICD-10-CM | POA: Diagnosis not present

## 2024-05-14 DIAGNOSIS — K859 Acute pancreatitis without necrosis or infection, unspecified: Secondary | ICD-10-CM | POA: Diagnosis present

## 2024-05-14 DIAGNOSIS — K8500 Idiopathic acute pancreatitis without necrosis or infection: Secondary | ICD-10-CM | POA: Diagnosis present

## 2024-05-14 DIAGNOSIS — K219 Gastro-esophageal reflux disease without esophagitis: Secondary | ICD-10-CM | POA: Diagnosis present

## 2024-05-14 DIAGNOSIS — F1190 Opioid use, unspecified, uncomplicated: Secondary | ICD-10-CM | POA: Diagnosis present

## 2024-05-14 DIAGNOSIS — F419 Anxiety disorder, unspecified: Secondary | ICD-10-CM | POA: Insufficient documentation

## 2024-05-14 DIAGNOSIS — E785 Hyperlipidemia, unspecified: Secondary | ICD-10-CM | POA: Diagnosis present

## 2024-05-14 DIAGNOSIS — F32A Depression, unspecified: Secondary | ICD-10-CM | POA: Insufficient documentation

## 2024-05-14 DIAGNOSIS — Z8673 Personal history of transient ischemic attack (TIA), and cerebral infarction without residual deficits: Secondary | ICD-10-CM

## 2024-05-14 DIAGNOSIS — Z880 Allergy status to penicillin: Secondary | ICD-10-CM | POA: Diagnosis not present

## 2024-05-14 DIAGNOSIS — Z882 Allergy status to sulfonamides status: Secondary | ICD-10-CM | POA: Diagnosis not present

## 2024-05-14 DIAGNOSIS — M109 Gout, unspecified: Secondary | ICD-10-CM | POA: Diagnosis present

## 2024-05-14 DIAGNOSIS — Z981 Arthrodesis status: Secondary | ICD-10-CM | POA: Diagnosis not present

## 2024-05-14 DIAGNOSIS — I10 Essential (primary) hypertension: Secondary | ICD-10-CM | POA: Diagnosis present

## 2024-05-14 DIAGNOSIS — Z79891 Long term (current) use of opiate analgesic: Secondary | ICD-10-CM | POA: Diagnosis not present

## 2024-05-14 DIAGNOSIS — I700 Atherosclerosis of aorta: Secondary | ICD-10-CM | POA: Diagnosis present

## 2024-05-14 DIAGNOSIS — Z79899 Other long term (current) drug therapy: Secondary | ICD-10-CM | POA: Diagnosis not present

## 2024-05-14 DIAGNOSIS — F17210 Nicotine dependence, cigarettes, uncomplicated: Secondary | ICD-10-CM | POA: Diagnosis present

## 2024-05-14 DIAGNOSIS — Z888 Allergy status to other drugs, medicaments and biological substances status: Secondary | ICD-10-CM | POA: Diagnosis not present

## 2024-05-14 DIAGNOSIS — Z7982 Long term (current) use of aspirin: Secondary | ICD-10-CM | POA: Diagnosis not present

## 2024-05-14 LAB — URINALYSIS, ROUTINE W REFLEX MICROSCOPIC
Bilirubin Urine: NEGATIVE
Glucose, UA: NEGATIVE mg/dL
Hgb urine dipstick: NEGATIVE
Ketones, ur: NEGATIVE mg/dL
Leukocytes,Ua: NEGATIVE
Nitrite: NEGATIVE
Protein, ur: NEGATIVE mg/dL
Specific Gravity, Urine: 1.019 (ref 1.005–1.030)
pH: 6 (ref 5.0–8.0)

## 2024-05-14 LAB — COMPREHENSIVE METABOLIC PANEL WITH GFR
ALT: 12 U/L (ref 0–44)
AST: 19 U/L (ref 15–41)
Albumin: 4 g/dL (ref 3.5–5.0)
Alkaline Phosphatase: 114 U/L (ref 38–126)
Anion gap: 15 (ref 5–15)
BUN: 9 mg/dL (ref 8–23)
CO2: 20 mmol/L — ABNORMAL LOW (ref 22–32)
Calcium: 10 mg/dL (ref 8.9–10.3)
Chloride: 110 mmol/L (ref 98–111)
Creatinine, Ser: 0.93 mg/dL (ref 0.44–1.00)
GFR, Estimated: >60 mL/min (ref >60)
Glucose, Bld: 134 mg/dL — ABNORMAL HIGH (ref 70–99)
Potassium: 4.5 mmol/L (ref 3.5–5.1)
Sodium: 145 mmol/L (ref 135–145)
Total Bilirubin: 1.1 mg/dL (ref 0.0–1.2)
Total Protein: 7.2 g/dL (ref 6.5–8.1)

## 2024-05-14 LAB — CBC
HCT: 43.5 % (ref 36.0–46.0)
Hemoglobin: 14.4 g/dL (ref 12.0–15.0)
MCH: 28.2 pg (ref 26.0–34.0)
MCHC: 33.1 g/dL (ref 30.0–36.0)
MCV: 85.3 fL (ref 80.0–100.0)
Platelets: 340 K/uL (ref 150–400)
RBC: 5.1 MIL/uL (ref 3.87–5.11)
RDW: 12.8 % (ref 11.5–15.5)
WBC: 10 K/uL (ref 4.0–10.5)
nRBC: 0 % (ref 0.0–0.2)

## 2024-05-14 LAB — URINE DRUG SCREEN, QUALITATIVE (ARMC ONLY)
Amphetamines, Ur Screen: NOT DETECTED
Barbiturates, Ur Screen: NOT DETECTED
Benzodiazepine, Ur Scrn: NOT DETECTED
Cannabinoid 50 Ng, Ur ~~LOC~~: POSITIVE — AB
Cocaine Metabolite,Ur ~~LOC~~: NOT DETECTED
MDMA (Ecstasy)Ur Screen: NOT DETECTED
Methadone Scn, Ur: NOT DETECTED
Opiate, Ur Screen: POSITIVE — AB
Phencyclidine (PCP) Ur S: NOT DETECTED
Tricyclic, Ur Screen: NOT DETECTED

## 2024-05-14 LAB — TROPONIN I (HIGH SENSITIVITY): Troponin I (High Sensitivity): 5 ng/L (ref <18)

## 2024-05-14 LAB — LIPASE, BLOOD
Lipase: 153 U/L — ABNORMAL HIGH (ref 11–51)
Lipase: 199 U/L — ABNORMAL HIGH (ref 11–51)

## 2024-05-14 MED ORDER — COLCHICINE 0.6 MG PO TABS
0.6000 mg | ORAL_TABLET | Freq: Every day | ORAL | Status: DC
  Administered 2024-05-14 – 2024-05-15 (×2): 0.6 mg via ORAL
  Filled 2024-05-14 (×2): qty 1

## 2024-05-14 MED ORDER — ENOXAPARIN SODIUM 40 MG/0.4ML IJ SOSY
40.0000 mg | PREFILLED_SYRINGE | INTRAMUSCULAR | Status: DC
  Administered 2024-05-14 – 2024-05-16 (×3): 40 mg via SUBCUTANEOUS
  Filled 2024-05-14 (×3): qty 0.4

## 2024-05-14 MED ORDER — MORPHINE SULFATE (PF) 2 MG/ML IV SOLN: 2.0000 mg | INTRAVENOUS | Status: DC | PRN

## 2024-05-14 MED ORDER — LACTATED RINGERS IV BOLUS
1000.0000 mL | Freq: Once | INTRAVENOUS | Status: AC
  Administered 2024-05-14: 1000 mL via INTRAVENOUS

## 2024-05-14 MED ORDER — ACETAMINOPHEN 650 MG RE SUPP: 650.0000 mg | Freq: Four times a day (QID) | RECTAL | Status: DC | PRN

## 2024-05-14 MED ORDER — LISINOPRIL 20 MG PO TABS
40.0000 mg | ORAL_TABLET | Freq: Every day | ORAL | Status: DC
Start: 2024-05-14 — End: 2024-05-16
  Administered 2024-05-14 – 2024-05-16 (×3): 40 mg via ORAL
  Filled 2024-05-14 (×3): qty 2

## 2024-05-14 MED ORDER — NICOTINE 7 MG/24HR TD PT24
7.0000 mg | MEDICATED_PATCH | Freq: Every day | TRANSDERMAL | Status: DC
  Administered 2024-05-14 – 2024-05-16 (×3): 7 mg via TRANSDERMAL
  Filled 2024-05-14 (×3): qty 1

## 2024-05-14 MED ORDER — MORPHINE SULFATE (PF) 4 MG/ML IV SOLN
4.0000 mg | Freq: Once | INTRAVENOUS | Status: AC
  Administered 2024-05-14: 4 mg via INTRAVENOUS
  Filled 2024-05-14: qty 1

## 2024-05-14 MED ORDER — ACETAMINOPHEN 325 MG PO TABS
650.0000 mg | ORAL_TABLET | Freq: Four times a day (QID) | ORAL | Status: DC | PRN
  Administered 2024-05-16: 650 mg via ORAL
  Filled 2024-05-14: qty 2

## 2024-05-14 MED ORDER — PANTOPRAZOLE SODIUM 40 MG IV SOLR
40.0000 mg | Freq: Two times a day (BID) | INTRAVENOUS | Status: DC
  Administered 2024-05-14 – 2024-05-16 (×4): 40 mg via INTRAVENOUS
  Filled 2024-05-14 (×4): qty 10

## 2024-05-14 MED ORDER — ONDANSETRON 4 MG PO TBDP
4.0000 mg | ORAL_TABLET | Freq: Four times a day (QID) | ORAL | Status: DC | PRN
  Administered 2024-05-14 – 2024-05-15 (×3): 4 mg via ORAL
  Filled 2024-05-14 (×3): qty 1

## 2024-05-14 MED ORDER — SODIUM CHLORIDE 0.9 % IV SOLN: INTRAVENOUS | Status: AC

## 2024-05-14 MED ORDER — HYDROMORPHONE HCL 1 MG/ML IJ SOLN
1.0000 mg | INTRAMUSCULAR | Status: DC | PRN
  Administered 2024-05-14 (×2): 1 mg via INTRAVENOUS
  Filled 2024-05-14 (×2): qty 1

## 2024-05-14 MED ORDER — ESCITALOPRAM OXALATE 10 MG PO TABS
10.0000 mg | ORAL_TABLET | Freq: Every day | ORAL | Status: DC
  Administered 2024-05-14 – 2024-05-16 (×3): 10 mg via ORAL
  Filled 2024-05-14 (×3): qty 1

## 2024-05-14 MED ORDER — MAGNESIUM HYDROXIDE 400 MG/5ML PO SUSP
30.0000 mL | Freq: Every day | ORAL | Status: DC | PRN
  Filled 2024-05-14: qty 30

## 2024-05-14 MED ORDER — ONDANSETRON HCL 4 MG/2ML IJ SOLN
4.0000 mg | INTRAMUSCULAR | Status: AC
  Administered 2024-05-14: 4 mg via INTRAVENOUS
  Filled 2024-05-14: qty 2

## 2024-05-14 MED ORDER — VITAMIN D 25 MCG (1000 UNIT) PO TABS
2000.0000 [IU] | ORAL_TABLET | Freq: Every day | ORAL | Status: DC
  Administered 2024-05-14 – 2024-05-16 (×3): 2000 [IU] via ORAL
  Filled 2024-05-14 (×3): qty 2

## 2024-05-14 MED ORDER — KETOROLAC TROMETHAMINE 15 MG/ML IJ SOLN
15.0000 mg | Freq: Four times a day (QID) | INTRAMUSCULAR | Status: DC | PRN
  Administered 2024-05-14 – 2024-05-15 (×2): 15 mg via INTRAVENOUS
  Filled 2024-05-14 (×2): qty 1

## 2024-05-14 MED ORDER — MAGNESIUM CITRATE PO SOLN: 1.0000 | Freq: Once | ORAL | Status: DC | PRN

## 2024-05-14 MED ORDER — ATORVASTATIN CALCIUM 20 MG PO TABS
80.0000 mg | ORAL_TABLET | Freq: Every day | ORAL | Status: DC
  Administered 2024-05-15: 80 mg via ORAL
  Filled 2024-05-14: qty 4

## 2024-05-14 MED ORDER — PANTOPRAZOLE SODIUM 40 MG PO TBEC
40.0000 mg | DELAYED_RELEASE_TABLET | Freq: Every day | ORAL | Status: DC
  Administered 2024-05-14: 40 mg via ORAL
  Filled 2024-05-14: qty 1

## 2024-05-14 MED ORDER — IOHEXOL 350 MG/ML SOLN
100.0000 mL | Freq: Once | INTRAVENOUS | Status: AC | PRN
Start: 2024-05-14 — End: 2024-05-14
  Administered 2024-05-14: 100 mL via INTRAVENOUS

## 2024-05-14 MED ORDER — TRAZODONE HCL 50 MG PO TABS
25.0000 mg | ORAL_TABLET | Freq: Every evening | ORAL | Status: DC | PRN
  Administered 2024-05-15: 25 mg via ORAL
  Filled 2024-05-14: qty 1

## 2024-05-14 MED ORDER — PNEUMOCOCCAL 20-VAL CONJ VACC 0.5 ML IM SUSY
0.5000 mL | PREFILLED_SYRINGE | INTRAMUSCULAR | Status: AC
  Administered 2024-05-15: 0.5 mL via INTRAMUSCULAR
  Filled 2024-05-14: qty 0.5

## 2024-05-14 MED ORDER — SUCRALFATE 1 GM/10ML PO SUSP
1.0000 g | Freq: Three times a day (TID) | ORAL | Status: DC
  Administered 2024-05-14 – 2024-05-16 (×9): 1 g via ORAL
  Filled 2024-05-14 (×9): qty 10

## 2024-05-14 MED ORDER — TRAMADOL HCL 50 MG PO TABS
50.0000 mg | ORAL_TABLET | Freq: Four times a day (QID) | ORAL | Status: DC | PRN
  Administered 2024-05-15: 50 mg via ORAL
  Filled 2024-05-14: qty 1

## 2024-05-14 MED ORDER — HYDRALAZINE HCL 50 MG PO TABS
50.0000 mg | ORAL_TABLET | Freq: Three times a day (TID) | ORAL | Status: DC | PRN
  Administered 2024-05-15: 50 mg via ORAL
  Filled 2024-05-14: qty 1

## 2024-05-14 MED ORDER — LEVOTHYROXINE SODIUM 50 MCG PO TABS
50.0000 ug | ORAL_TABLET | Freq: Every day | ORAL | Status: DC
  Administered 2024-05-14 – 2024-05-16 (×3): 50 ug via ORAL
  Filled 2024-05-14 (×3): qty 1

## 2024-05-14 MED ORDER — ATORVASTATIN CALCIUM 20 MG PO TABS: 80.0000 mg | ORAL_TABLET | Freq: Every day | ORAL | Status: DC

## 2024-05-14 NOTE — ED Provider Notes (Signed)
 [facility] Provider Note    Event Date/Time   First MD Initiated Contact with Patient 05/14/24 0139     (approximate)   History   Abdominal Pain   HPI Jamie Williams is a 63 y.o. female who presents for evaluation of worsening abdominal pain.  She said the pain started about a week ago.  She came to the emergency department 4 to 5 days ago and had a reassuring CT scan and a very minimal elevation of her lipase.  She was appropriate for discharge at that time but says she has continued to feel worse and the medication she was prescribed have not helped her.  She cannot eat or drink anything because of the pain and the nausea.  She is generally weak.  She has not had any fever.  The pain is located in the middle of the upper part of her abdomen.  No chest pain or shortness of breath.  She reports that she does not drink any alcohol.  She started some new medications almost 2 months ago when she was admitted to this hospital for a stroke, but otherwise she has no new medications.     Physical Exam   Triage Vital Signs: ED Triage Vitals  Encounter Vitals Group     BP 05/14/24 0009 (!) 150/84     Girls Systolic BP Percentile --      Girls Diastolic BP Percentile --      Boys Systolic BP Percentile --      Boys Diastolic BP Percentile --      Pulse Rate 05/14/24 0009 65     Resp 05/14/24 0009 16     Temp 05/14/24 0009 99.1 F (37.3 C)     Temp Source 05/14/24 0009 Oral     SpO2 05/14/24 0009 93 %     Weight 05/14/24 0014 79.8 kg (176 lb)     Height 05/14/24 0014 1.651 m (5' 5)     Head Circumference --      Peak Flow --      Pain Score 05/14/24 0013 10     Pain Loc --      Pain Education --      Exclude from Growth Chart --     Most recent vital signs: Vitals:   05/14/24 0009  BP: (!) 150/84  Pulse: 65  Resp: 16  Temp: 99.1 F (37.3 C)  SpO2: 93%    General: Awake, appears very uncomfortable and ill but nontoxic. CV:  Good peripheral  perfusion.  Regular rate and rhythm. Resp:  Normal effort. Speaking easily and comfortably, no accessory muscle usage nor intercostal retractions.   Abd:  No distention.  Severe tenderness to palpation and guarding of the epigastrium, less so in the right upper quadrant.  No lower abdominal tenderness although it does cause some referred tenderness in the epigastrium.   ED Results / Procedures / Treatments   Labs (all labs ordered are listed, but only abnormal results are displayed) Labs Reviewed  LIPASE, BLOOD - Abnormal; Notable for the following components:      Result Value   Lipase 199 (*)    All other components within normal limits  COMPREHENSIVE METABOLIC PANEL WITH GFR - Abnormal; Notable for the following components:   CO2 20 (*)    Glucose, Bld 134 (*)    All other components within normal limits  CBC  URINALYSIS, ROUTINE W REFLEX MICROSCOPIC  TROPONIN I (HIGH SENSITIVITY)  EKG  ED ECG REPORT I, Darleene Dome, the attending physician, personally viewed and interpreted this ECG.  Date: 05/14/2024 EKG Time: 00: 04 Rate: 69 Rhythm: normal sinus rhythm QRS Axis: normal Intervals: normal ST/T Wave abnormalities: normal Narrative Interpretation: no evidence of acute ischemia    RADIOLOGY I independently viewed and interpreted the patient's right upper quadrant ultrasound and I see no evidence of gallbladder disease including a normal common bile duct and no stones.  Radiology concurs, no acute abnormalities   PROCEDURES:  Critical Care performed: No  Procedures    IMPRESSION / MDM / ASSESSMENT AND PLAN / ED COURSE  I reviewed the triage vital signs and the nursing notes.                              Differential diagnosis includes, but is not limited to, pancreatitis, biliary disease, electrolyte or metabolic abnormality, gastritis  Patient's presentation is most consistent with acute presentation with potential threat to life or bodily  function.  Labs/studies ordered: High sensitive troponin, CBC, CMP, lipase, right upper quadrant ultrasound, EKG  Interventions/Medications given:  Medications  lactated ringers  bolus 1,000 mL (1,000 mLs Intravenous New Bag/Given 05/14/24 0238)  morphine  (PF) 4 MG/ML injection 4 mg (4 mg Intravenous Given 05/14/24 0234)  ondansetron  (ZOFRAN ) injection 4 mg (4 mg Intravenous Given 05/14/24 0232)    (Note:  hospital course my include additional interventions and/or labs/studies not listed above.)   Vital signs stable, no evidence of infection.  Lipase is going up from about 50 to about 200.  LFTs are normal including bilirubin.  No leukocytosis which is reassuring.  I reviewed the prior ED evaluation and verified that she had a normal CT scan at the time with no radiographic evidence of pancreatitis.  No indication to repeat tonight.  Will admit the patient for idiopathic pancreatitis.  Ordering morphine  4 mg IV and Zofran  4 mg IV as well as 1 L of LR.  Patient understands and agrees with the plan     Clinical Course as of 05/14/24 0243  Mon May 14, 2024  0210 I am consulting the hospitalist team for admission.   [CF]  [phone] I consulted by phone with the admitting hospitalist, and they will admit the patient - Dr. Lawence [CF]    Clinical Course User Index [CF] Dome Darleene, MD     FINAL CLINICAL IMPRESSION(S) / ED DIAGNOSES   Final diagnoses:  Idiopathic acute pancreatitis, unspecified complication status     Rx / DC Orders   ED Discharge Orders     None        Note:  This document was prepared using Dragon voice recognition software and may include unintentional dictation errors.   Dome Darleene, MD 05/14/24 [phone]

## 2024-05-14 NOTE — H&P (Addendum)
 [facility]   PATIENT NAME: Jamie Williams    MR#:  [id number]  DATE OF BIRTH:  [birth year]  DATE OF ADMISSION:  05/14/2024  PRIMARY CARE PHYSICIAN: Kristina Tinnie POUR, PA-C   Patient is coming from: Home  REQUESTING/REFERRING PHYSICIAN: Gordan Huxley, MD  CHIEF COMPLAINT:   Chief Complaint  Patient presents with  . Abdominal Pain    HISTORY OF PRESENT ILLNESS:  Jamie Williams is a 63 y.o. African-American female with medical history significant for anxiety, GERD, hypertension and dyslipidemia, who presented to the emergency room with acute onset of epigastric abdominal pain for more than a week with associated nausea without vomiting.  She denied any diarrhea or constipation.  No fever or chills.  No dysuria, oliguria or hematuria or flank pain.  She has not had an alcoholic drink for couple of months.  She is not aware of any previous history of cholelithiasis.  No new medications or herbal supplements.  She continues to smoke 5 cigarettes/day.  No fever or chills.  No melena or bright red blood per rectum.  No other bleeding diathesis.  The patient was seen in the ER on 7/16 with headache and abdominal pain in addition to nausea.  He was managed with antiemetics and hydration and discharged.  Abdominal and pelvic CT scan and then revealed aortic atherosclerosis, normal appendix and no acute inflammatory process in the abdomen or pelvis.  Noncontrast head CT scan then revealed left deep gray and white matter lacunar infarcts and no acute intracranial abnormality.  ED Course: When she came to the ER, BP was 150/84 with otherwise normal vital signs.  Labs revealed a CO2 of 20 and a blood glucose of 134 with otherwise normal CMP.  Serum lipase was 199 up from 56 on 05/09/2024. EKG as reviewed by me :  EKG showed normal sinus rhythm with a rate of 69 without acute changes. Imaging: As above.  Portable chest x-ray on 7/16 showed no acute findings and no free air. Right upper quadrant  ultrasound to revealed only hepatic steatosis.  The patient was given 4 mg of IV Zofran , 4 mg of IV morphine  sulfate, 1 L bolus of IV blood ringer.  She will be admitted to a medical-surgical bed for further evaluation and management. PAST MEDICAL HISTORY:   Past Medical History:  Diagnosis Date  . Anxiety   . Family history of adverse reaction to anesthesia    sister had a difficult time waking up from mastectomy and passed away   . GERD (gastroesophageal reflux disease)   . History of chicken pox   . History of kidney stones   . Hyperlipidemia   . Hypertension   . PONV (postoperative nausea and vomiting)    nausea  . Sciatic nerve pain     PAST SURGICAL HISTORY:   Past Surgical History:  Procedure Laterality Date  . ANTERIOR CERVICAL DECOMP/DISCECTOMY FUSION N/A 12/24/2015   Procedure: Cervical five-six, Cervical six-seven  Anterior cervical decompression/diskectomy/fusion/interbody prosthesis/plate;  Surgeon: Reyes Budge, MD;  Location: [facility] NEURO ORS;  Service: Neurosurgery;  Laterality: N/A;  . COLONOSCOPY WITH PROPOFOL  N/A 07/17/2018   Procedure: COLONOSCOPY WITH PROPOFOL ;  Surgeon: Therisa Bi, MD;  Location: [facility] ENDOSCOPY;  Service: Gastroenterology;  Laterality: N/A;  . ECTOPIC PREGNANCY SURGERY  1991 and 1996   two Etopic preg.  SABRA Head Injuries  2006   surgery 2006 Cram/NS in [place]. Headaches with increased intracranial pressure. Repeat MRI in 2008 Negative  . SUPRACERVICAL ABDOMINAL HYSTERECTOMY  2011   Abdominal; Ovaries intact; CERVIX INTACT. Fibroids/dys menorrhea. [place]  . Ulnar Neuropathy Right 2004   Ulnar Neuropathy surgical revision. 2004-2008. [place].    SOCIAL HISTORY:   Social History   Tobacco Use  . Smoking status: Every Day    Current packs/day: 0.50    Types: Cigarettes  . Smokeless tobacco: Never  . Tobacco comments:    5 cigarettes daily.  Substance Use Topics  . Alcohol use: Not Currently    Alcohol/week: 0.0 standard  drinks of alcohol    FAMILY HISTORY:   Family History  Problem Relation Age of Onset  . Hypertension Mother   . Diabetes Mother        Type 2  . Breast cancer Mother 28  . Cancer Sister 67       Breast  . Diabetes Sister        type 2  . Breast cancer Sister 15  . Breast cancer Sister   . Diabetes Sister   . Diabetes Sister   . Diabetes Sister        type 2  . Liver cancer Brother   . Stomach cancer Brother     DRUG ALLERGIES:   Allergies  Allergen Reactions  . Celebrex [Celecoxib] Other (See Comments)    Dizziness, light-headedness  . Aspirin  Nausea And Vomiting  . Penicillins Rash  . Sulfa Antibiotics Hives    REVIEW OF SYSTEMS:   ROS As per history of present illness. All pertinent systems were reviewed above. Constitutional, HEENT, cardiovascular, respiratory, GI, GU, musculoskeletal, neuro, psychiatric, endocrine, integumentary and hematologic systems were reviewed and are otherwise negative/unremarkable except for positive findings mentioned above in the HPI.   MEDICATIONS AT HOME:   Prior to Admission medications   Medication Sig Start Date End Date Taking? Authorizing Provider  aspirin  EC 81 MG tablet Take 1 tablet (81 mg total) by mouth daily. Swallow whole. 04/30/24 04/30/25  McDonough, Tinnie POUR, PA-C  atorvastatin  (LIPITOR) 80 MG tablet Take 1 tablet (80 mg total) by mouth at bedtime. 04/30/24 04/30/25  McDonough, Lauren K, PA-C  cholecalciferol  (VITAMIN D ) 1000 units tablet Take 2,000 Units by mouth daily.     [provider]  colchicine  0.6 MG tablet Take 2 tablets (1.2 mg) on first day of gout flare, then 1 tablet daily 01/20/24   Gasper Nancyann BRAVO, MD  cyclobenzaprine  (FLEXERIL ) 10 MG tablet Take 1 tablet (10 mg total) by mouth 3 (three) times daily as needed for muscle spasms. Take one tab po qhs for back spasm prn only 02/06/24   McDonough, Lauren K, PA-C  erythromycin  ophthalmic ointment Place 1 Application into the left eye at bedtime. 06/08/23    Ostwalt, Janna, PA-C  escitalopram  (LEXAPRO ) 10 MG tablet Take 1 tablet (10 mg total) by mouth daily. 04/03/24   Abernathy, Alyssa, NP  hydrALAZINE  (APRESOLINE ) 50 MG tablet Take 1 tablet (50 mg total) by mouth 3 (three) times daily as needed (If systolic BP greater than 150 mmHg.). If systolic BP greater than 150 mmHg. 04/30/24 05/30/24  McDonough, Tinnie POUR, PA-C  levothyroxine  (SYNTHROID ) 50 MCG tablet Take 1 tablet (50 mcg total) by mouth daily. 04/30/24   McDonough, Tinnie POUR, PA-C  lisinopril  (ZESTRIL ) 40 MG tablet Take 1 tablet (40 mg total) by mouth daily. 04/30/24   McDonough, Tinnie POUR, PA-C  nicotine  (NICODERM CQ  - DOSED IN MG/24 HR) 7 mg/24hr patch PLACE 1 PATCH ONTO THE SKIN DAILY 08/18/23   Gasper Nancyann BRAVO, MD  ondansetron  (  ZOFRAN -ODT) 4 MG disintegrating tablet Take 1 tablet (4 mg total) by mouth every 8 (eight) hours as needed for nausea or vomiting. 05/09/24   Cyrena Mylar, MD  pantoprazole  (PROTONIX ) 40 MG tablet Take 1 tablet (40 mg total) by mouth daily. 05/09/24   Cyrena Mylar, MD  promethazine  (PHENERGAN ) 25 MG tablet TAKE 1 TABLET(25 MG) BY MOUTH EVERY 6 HOURS AS NEEDED FOR NAUSEA OR VOMITING 04/23/24   Liana Fish, NP  sucralfate  (CARAFATE ) 1 GM/10ML suspension Take 10 mLs (1 g total) by mouth every 8 (eight) hours as needed. 05/09/24 05/09/25  Cyrena Mylar, MD  traMADol  (ULTRAM ) 50 MG tablet Take 1/2 -1 tablet by mouth daily as needed for pain 04/24/24   McDonough, Lauren K, PA-C      VITAL SIGNS:  Blood pressure (!) 152/75, pulse (!) 57, temperature 98.3 F (36.8 C), resp. rate 20, height 5' 5 (1.651 m), weight 79.8 kg, SpO2 100%.  PHYSICAL EXAMINATION:  Physical Exam  GENERAL:  63 y.o.-year-old patient lying in the bed with no acute distress.  EYES: Pupils equal, round, reactive to light and accommodation. No scleral icterus. Extraocular muscles intact.  HEENT: Head atraumatic, normocephalic. Oropharynx and nasopharynx clear.  NECK:  Supple, no jugular venous distention. No  thyroid  enlargement, no tenderness.  LUNGS: Normal breath sounds bilaterally, no wheezing, rales,rhonchi or crepitation. No use of accessory muscles of respiration.  CARDIOVASCULAR: Regular rate and rhythm, S1, S2 normal. No murmurs, rubs, or gallops.  ABDOMEN: Soft, nondistended, with epigastric tenderness without rebound tenderness guarding or rigidity.. Bowel sounds present. No organomegaly or mass.  EXTREMITIES: No pedal edema, cyanosis, or clubbing.  NEUROLOGIC: Cranial nerves II through XII are intact. Muscle strength 5/5 in all extremities. Sensation intact. Gait not checked.  PSYCHIATRIC: The patient is alert and oriented x 3.  Normal affect and good eye contact. SKIN: No obvious rash, lesion, or ulcer.   LABORATORY PANEL:   CBC Recent Labs  Lab 05/14/24 0017  WBC 10.0  HGB 14.4  HCT 43.5  PLT 340   ------------------------------------------------------------------------------------------------------------------  Chemistries  Recent Labs  Lab 05/14/24 0017  NA 145  K 4.5  CL 110  CO2 20*  GLUCOSE 134*  BUN 9  CREATININE 0.93  CALCIUM  10.0  AST 19  ALT 12  ALKPHOS 114  BILITOT 1.1   ------------------------------------------------------------------------------------------------------------------  Cardiac Enzymes No results for input(s): TROPONINI in the last 168 hours. ------------------------------------------------------------------------------------------------------------------  RADIOLOGY:  US  ABDOMEN LIMITED RUQ (LIVER/GB) Result Date: 05/14/2024 CLINICAL DATA:  Right upper quadrant pain. EXAM: ULTRASOUND ABDOMEN LIMITED RIGHT UPPER QUADRANT COMPARISON:  July 11, 2012 FINDINGS: Gallbladder: No gallstones or wall thickening visualized (2.0 mm). No sonographic Murphy sign noted by sonographer. Common bile duct: Diameter: 3.6 mm Liver: No focal lesion identified. Diffusely increased echogenicity of the liver parenchyma is noted. Portal vein is patent on  color Doppler imaging with normal direction of blood flow towards the liver. Other: None. IMPRESSION: Hepatic steatosis. Electronically Signed   By: Suzen Dials M.D.   On: 05/14/2024 00:57      IMPRESSION AND PLAN:  Assessment and Plan: * Acute pancreatitis - This is of unclear etiology likely idiopathic. - The patient will be admitted to a medical-surgical bed. - Pain management will be provided. - Should be kept n.p.o. except for p.o. medications. - Will continue hydration with IV normal saline. - Will follow lipase levels. - She was advised to continue to stay away from alcohol.  Dyslipidemia - Will continue statin therapy.  Essential hypertension -  Will continue antihypertensive therapy.  Hypothyroidism - Will continue Synthroid .  Anxiety and depression - Will continue Lexapro .  GERD without esophagitis - Will continue PPI therapy and Carafate .   DVT prophylaxis: Lovenox . Advanced Care Planning:  Code Status: full code per Family Communication:  The plan of care was discussed in details with the patient (and family). I answered all questions. The patient agreed to proceed with the above mentioned plan. Further management will depend upon hospital course. Disposition Plan: Back to previous home environment Consults called: none. All the records are reviewed and case discussed with ED provider.  Status is: Inpatient  At the time of the admission, it appears that the appropriate admission status for this patient is inpatient.  This is judged to be reasonable and necessary in order to provide the required intensity of service to ensure the patient's safety given the presenting symptoms, physical exam findings and initial radiographic and laboratory data in the context of comorbid conditions.  The patient requires inpatient status due to high intensity of service, high risk of further deterioration and high frequency of surveillance required.  I certify that at the  time of admission, it is my clinical judgment that the patient will require inpatient hospital care extending more than 2 midnights.                            Dispo: The patient is from: Home              Anticipated d/c is to: Home              Patient currently is not medically stable to d/c.              Difficult to place patient: No  Madison DELENA Peaches M.D on 05/14/2024 at 6:30 AM  [facility]   From 7 PM-7 AM, contact night-coverage [email or web address]  CC: Primary care physician; Kristina Tinnie POUR, PA-C

## 2024-05-14 NOTE — Assessment & Plan Note (Addendum)
-   This is of unclear etiology likely idiopathic. - The patient will be admitted to a medical-surgical bed. - Pain management will be provided. - Should be kept n.p.o. except for p.o. medications. - Will continue hydration with IV normal saline. - Will follow lipase levels. - She was advised to continue to stay away from alcohol.

## 2024-05-14 NOTE — Assessment & Plan Note (Signed)
 Will continue statin therapy

## 2024-05-14 NOTE — Progress Notes (Signed)
 Interim progress note not for billing.  Admitted earlier today by my colleague. I have seen and examined the patient, reviewed the chart, and agree with the assessment and plan.  History htn, hypothyroid, hospitalized last month with acute thalamic stroke, presenting with one week epigastric abdominal pain with nausea. No vomiting. Loose stool yesterday. Thinks worse with eating. Was seen in the ER for this on 7/16, benign CT at that time. Here afebrile, labs are reassuring no white count of lft abnormality. RUQ u/s normal bile ducts, no signs cholecystitis or other abnormality such as gallstone pancreatitis, only hepatic steatosis. Lipase is mildly elevated. Denies history of significant alcohol consumption. Lipids last month showed normal triglycerides. Med rec pending but doesn't appear to be on med that would provoke this. Today for what appears to be idiopathic pancreatitis will continue IVF and will allow clear liquid diet. For this report of loose/watery stool will order stool studies. Will also order repeat abdominal CT, and given report that pain worsens with eating, will order as angiogram. Will also f/u UDS. Will also order PPI, consider GI consult and consideration of EGD if CT unremarkable and significant pain persists.

## 2024-05-14 NOTE — Progress Notes (Signed)
   05/14/24 1215  Spiritual Encounters  Type of Visit Initial  Care provided to: Patient  Conversation partners present during encounter Nurse  Reason for visit Advance directives  OnCall Visit Yes   On-Call Chaplain visited patient because of an [place] for an AD in the EPIC system.  Chaplain explained document to patient and patient understood.  Chaplain asked if there was anything else the patient needed and patient asked for prayer.  Chaplain prayed with patient and shared that when she was ready for the notary potion of the process she could ask the Nurse to page the [place] phone.    Rev. Rana M. Nicholaus, M.Div. Chaplain Resident [facility]

## 2024-05-14 NOTE — Assessment & Plan Note (Signed)
-   Will continue Synthroid.

## 2024-05-14 NOTE — Assessment & Plan Note (Signed)
-   Will continue PPI therapy and Carafate .

## 2024-05-14 NOTE — Plan of Care (Signed)
  Problem: Education: Goal: Knowledge of General Education information will improve Description: Including pain rating scale, medication(s)/side effects and non-pharmacologic comfort measures Outcome: Progressing   Problem: Health Behavior/Discharge Planning: Goal: Ability to manage health-related needs will improve Outcome: Progressing   Problem: Clinical Measurements: Goal: Respiratory complications will improve Outcome: Progressing   Problem: Activity: Goal: Risk for activity intolerance will decrease Outcome: Progressing   Problem: Coping: Goal: Level of anxiety will decrease Outcome: Progressing   Problem: Elimination: Goal: Will not experience complications related to urinary retention Outcome: Progressing   Problem: Safety: Goal: Ability to remain free from injury will improve Outcome: Progressing   Problem: Skin Integrity: Goal: Risk for impaired skin integrity will decrease Outcome: Progressing   Problem: Nutrition: Goal: Adequate nutrition will be maintained Outcome: Not Progressing   Problem: Elimination: Goal: Will not experience complications related to bowel motility Outcome: Not Progressing   Problem: Pain Managment: Goal: General experience of comfort will improve and/or be controlled Outcome: Not Progressing

## 2024-05-14 NOTE — Assessment & Plan Note (Signed)
-   Will continue Lexapro. 

## 2024-05-14 NOTE — ED Notes (Addendum)
 Pt being transported to rm 222 in stretcher at this time. 2C was called and informed that pt was on the way.

## 2024-05-14 NOTE — ED Triage Notes (Signed)
 Pt presented to ED POV with c/o constant epigastric pain x 1 week. States was seen 05/08/24 for same. States medications prescribed to her at last visit have not helped symptoms at all. Pt endorses weakness and nausea. Pt states I can't eat, I can't drink, I can't take this anymore. Denies fever, chills, shortness of breath, chest pain.

## 2024-05-14 NOTE — Assessment & Plan Note (Addendum)
Will continue antihypertensive therapy.

## 2024-05-15 ENCOUNTER — Ambulatory Visit

## 2024-05-15 ENCOUNTER — Ambulatory Visit: Admitting: Occupational Therapy

## 2024-05-15 DIAGNOSIS — K8500 Idiopathic acute pancreatitis without necrosis or infection: Secondary | ICD-10-CM | POA: Diagnosis not present

## 2024-05-15 LAB — COMPREHENSIVE METABOLIC PANEL WITH GFR
ALT: 11 U/L (ref 0–44)
AST: 14 U/L — ABNORMAL LOW (ref 15–41)
Albumin: 3.4 g/dL — ABNORMAL LOW (ref 3.5–5.0)
Alkaline Phosphatase: 98 U/L (ref 38–126)
Anion gap: 10 (ref 5–15)
BUN: 5 mg/dL — ABNORMAL LOW (ref 8–23)
CO2: 22 mmol/L (ref 22–32)
Calcium: 9.3 mg/dL (ref 8.9–10.3)
Chloride: 110 mmol/L (ref 98–111)
Creatinine, Ser: 0.73 mg/dL (ref 0.44–1.00)
GFR, Estimated: >60 mL/min (ref >60)
Glucose, Bld: 94 mg/dL (ref 70–99)
Potassium: 3.9 mmol/L (ref 3.5–5.1)
Sodium: 142 mmol/L (ref 135–145)
Total Bilirubin: 1 mg/dL (ref 0.0–1.2)
Total Protein: 6.1 g/dL — ABNORMAL LOW (ref 6.5–8.1)

## 2024-05-15 LAB — CBC
HCT: 38.4 % (ref 36.0–46.0)
Hemoglobin: 12.6 g/dL (ref 12.0–15.0)
MCH: 28.3 pg (ref 26.0–34.0)
MCHC: 32.8 g/dL (ref 30.0–36.0)
MCV: 86.3 fL (ref 80.0–100.0)
Platelets: 313 K/uL (ref 150–400)
RBC: 4.45 MIL/uL (ref 3.87–5.11)
RDW: 13 % (ref 11.5–15.5)
WBC: 7.5 K/uL (ref 4.0–10.5)
nRBC: 0 % (ref 0.0–0.2)

## 2024-05-15 LAB — LIPASE, BLOOD: Lipase: 121 U/L — ABNORMAL HIGH (ref 11–51)

## 2024-05-15 MED ORDER — SODIUM CHLORIDE 0.9 % IV SOLN: INTRAVENOUS | Status: AC

## 2024-05-15 MED ORDER — COLCHICINE 0.6 MG PO TABS: 0.6000 mg | ORAL_TABLET | Freq: Every day | ORAL | Status: DC | PRN

## 2024-05-15 NOTE — Plan of Care (Signed)

## 2024-05-15 NOTE — Progress Notes (Signed)
 PROGRESS NOTE    Jamie Williams  FMW:[id number]  DOB: 1961-06-13  DOA: 05/14/2024 PCP: Jamie Williams [facility]:   Hospital course:  63 year old female with HTN, anxiety and GERD was seen for abdominal pain.  Workup revealed elevated lipase with normal abdominal and pelvic CT with no evidence of gallstones.  Lipid profile from last month without any hypertriglyceridemia.  Patient denies any alcohol use.  Patient was admitted for idiopathic pancreatitis and treated with conservative management.   Subjective:  Patient thinks that she is doing somewhat better, decreased abdominal pain, decreased nausea.  Notes that she is hungry now.  Has tolerated clear liquid diet without difficulty.   Objective: Vitals:   05/14/24 1602 05/14/24 2043 05/15/24 0455 05/15/24 0801  BP: (!) 156/81 (!) 158/84 (!) 198/87 137/68  Pulse: (!) 53 (!) 55 (!) 57 (!) 51  Resp: 16 16 18 18   Temp: 98 F (36.7 C) 97.7 F (36.5 C) 97.9 F (36.6 C) 98.6 F (37 C)  TempSrc: Oral Oral Oral   SpO2: 97% 97% 98% 98%  Weight:      Height:        Intake/Output Summary (Last 24 hours) at 05/15/2024 1636 Last data filed at 05/15/2024 1431 Gross per 24 hour  Intake 780 ml  Output --  Net 780 ml   Filed Weights   05/14/24 0014  Weight: 79.8 kg     Exam:  General: Pleasant female sitting up in recliner in NAD Eyes: sclera anicteric, conjuctiva mild injection bilaterally CVS: S1-S2, regular  Respiratory:  decreased air entry bilaterally secondary to decreased inspiratory effort, rales at bases  GI: Abdominal exam with minimal tenderness epigastric area to light palpation, no rebound, no voluntary guarding LE: Warm and well-perfused Neuro: A/O x 3,  grossly nonfocal.  Psych: patient is logical and coherent, judgement and insight appear normal, mood and affect appropriate to situation.  Data Reviewed:  Basic Metabolic Panel: Recent Labs  Lab 05/09/24 0251 05/14/24 0017  05/15/24 0507  NA 140 145 142  K 4.0 4.5 3.9  CL 105 110 110  CO2 23 20* 22  GLUCOSE 173* 134* 94  BUN 10 9 5*  CREATININE 0.82 0.93 0.73  CALCIUM  9.9 10.0 9.3    CBC: Recent Labs  Lab 05/09/24 0251 05/14/24 0017 05/15/24 0507  WBC 13.8* 10.0 7.5  NEUTROABS 8.3*  --   --   HGB 14.8 14.4 12.6  HCT 45.7 43.5 38.4  MCV 87.5 85.3 86.3  PLT 358 340 313     Scheduled Meds:  atorvastatin   80 mg Oral QHS   cholecalciferol   2,000 Units Oral Daily   enoxaparin  (LOVENOX ) injection  40 mg Subcutaneous Q24H   escitalopram   10 mg Oral Daily   levothyroxine   50 mcg Oral Q0600   lisinopril   40 mg Oral Daily   nicotine   7 mg Transdermal Daily   pantoprazole  (PROTONIX ) IV  40 mg Intravenous Q12H   sucralfate   1 g Oral TID WC & HS   Continuous Infusions:  sodium chloride  75 mL/hr at 05/15/24 1111     Assessment & Plan:   Acute idiopathic pancreatitis No gallstones, tobacco use or hypertriglyceridemia (of note patient has positive talk screen for opiates and benzos which patient denies using she was puzzled by the r results, urine tox cream was taken after she received those medications in the ED) Patient is on lisinopril  which is a very rare cause of pancreatitis and lipase is improving despite continued  use of lisinopril , will continue. Patient is responding clinically and with decreasing lipase to conservative management with bowel rest and IV fluid support. Advance diet to full liquids and see how she tolerates.  Dyslipidemia HTN Hypothyroidism Anxiety and depression GERD Continue home medications    DVT prophylaxis: Lovenox  Code Status: Full Family Communication: None today     Studies: CT Angio Abd/Pel w/ and/or w/o Result Date: 05/14/2024 CLINICAL DATA:  Epigastric abdominal pain. EXAM: CTA ABDOMEN AND PELVIS WITHOUT AND WITH CONTRAST TECHNIQUE: Multidetector CT imaging of the abdomen and pelvis was performed using the standard protocol during bolus  administration of intravenous contrast. Multiplanar reconstructed images and MIPs were obtained and reviewed to evaluate the vascular anatomy. RADIATION DOSE REDUCTION: This exam was performed according to the departmental dose-optimization program which includes automated exposure control, adjustment of the mA and/or kV according to patient size and/or use of iterative reconstruction technique. CONTRAST:  OMNIPAQUE  IOHEXOL  350 MG/ML SOLN COMPARISON:  Abdominopelvic CT 05/09/2024, right upper quadrant ultrasound earlier today FINDINGS: VASCULAR Aorta: Normal caliber aorta without aneurysm, dissection, vasculitis or significant stenosis. Mild to moderate calcified atheromatous plaque. Celiac: Patent without evidence of aneurysm, dissection, vasculitis or significant stenosis. SMA: Patent without evidence of aneurysm, dissection, vasculitis or significant stenosis. Renals: Both renal arteries are patent without evidence of aneurysm, dissection, vasculitis, fibromuscular dysplasia or significant stenosis. IMA: Occluded at the origin with distal reconstitution. Inflow: Patent without evidence of aneurysm, dissection, vasculitis or significant stenosis. Moderate calcified atheromatous plaque. Proximal Outflow: Bilateral common femoral and visualized portions of the superficial and profunda femoral arteries are patent without evidence of aneurysm, dissection, vasculitis or significant stenosis. Veins: Venous phase imaging demonstrates patency of the portal, splenic, and mesenteric veins. No filling defects in the iliac veins or IVC. Review of the MIP images confirms the above findings. NON-VASCULAR Lower chest: Clear lung bases. Hepatobiliary: Unremarkable appearance of the liver. Layering density in the gallbladder likely represents vicarious excretion of contrast from prior exam, no sludge or stone seen on ultrasound earlier today. No pericholecystic inflammation. No biliary dilatation. Pancreas: Unremarkable. No  pancreatic ductal dilatation or surrounding inflammatory changes. Spleen: Normal in size without focal abnormality. Adrenals/Urinary Tract: Normal adrenal glands. No hydronephrosis, renal calculi, or focal renal abnormality. Nondistended urinary bladder, limiting assessment. Stomach/Bowel: Wall thickening of the distal esophagus. No evidence of gastric wall thickening or inflammation. No small or large bowel inflammation or obstruction. Normal appendix. Small to moderate volume of stool in the colon. Lymphatic: No enlarged lymph nodes in the abdomen or pelvis. Reproductive: Status post hysterectomy. No adnexal masses. Other: No free air, free fluid, or intra-abdominal fluid collection. Tiny fat containing umbilical hernia. Musculoskeletal: L5-S1 degenerative disc disease. Lower lumbar facet hypertrophy. There are no acute or suspicious osseous abnormalities. IMPRESSION: VASCULAR 1. No acute vascular findings. 2.  Aortic Atherosclerosis (ICD10-I70.0). NON-VASCULAR 1. No acute findings in the abdomen or pelvis. No explanation for pain. 2. Probable vicarious excretion of IV contrast within the gallbladder from prior CT. Electronically Signed   By: Andrea Gasman M.D.   On: 05/14/2024 16:02   US  ABDOMEN LIMITED RUQ (LIVER/GB) Result Date: 05/14/2024 CLINICAL DATA:  Right upper quadrant pain. EXAM: ULTRASOUND ABDOMEN LIMITED RIGHT UPPER QUADRANT COMPARISON:  July 11, 2012 FINDINGS: Gallbladder: No gallstones or wall thickening visualized (2.0 mm). No sonographic Murphy sign noted by sonographer. Common bile duct: Diameter: 3.6 mm Liver: No focal lesion identified. Diffusely increased echogenicity of the liver parenchyma is noted. Portal vein is patent on color Doppler imaging  with normal direction of blood flow towards the liver. Other: None. IMPRESSION: Hepatic steatosis. Electronically Signed   By: Suzen Dials M.D.   On: 05/14/2024 00:57    Principal Problem:   Acute pancreatitis Active Problems:    Dyslipidemia   Essential hypertension   Hypothyroidism   Tobacco abuse   Chronic, continuous use of opioids   GERD without esophagitis   Anxiety and depression   History of CVA (cerebrovascular accident)     Ivan Vangie Pike, [facility]  If 7PM-7AM, please contact night-coverage [email or web address]   LOS: 1 day

## 2024-05-16 DIAGNOSIS — K219 Gastro-esophageal reflux disease without esophagitis: Secondary | ICD-10-CM

## 2024-05-16 DIAGNOSIS — Z8673 Personal history of transient ischemic attack (TIA), and cerebral infarction without residual deficits: Secondary | ICD-10-CM

## 2024-05-16 DIAGNOSIS — F32A Depression, unspecified: Secondary | ICD-10-CM

## 2024-05-16 DIAGNOSIS — K8500 Idiopathic acute pancreatitis without necrosis or infection: Secondary | ICD-10-CM | POA: Diagnosis not present

## 2024-05-16 DIAGNOSIS — Z720 Tobacco use: Secondary | ICD-10-CM

## 2024-05-16 DIAGNOSIS — F1190 Opioid use, unspecified, uncomplicated: Secondary | ICD-10-CM

## 2024-05-16 DIAGNOSIS — F419 Anxiety disorder, unspecified: Secondary | ICD-10-CM

## 2024-05-16 DIAGNOSIS — E785 Hyperlipidemia, unspecified: Secondary | ICD-10-CM | POA: Diagnosis not present

## 2024-05-16 LAB — BASIC METABOLIC PANEL WITH GFR
Anion gap: 6 (ref 5–15)
BUN: 5 mg/dL — ABNORMAL LOW (ref 8–23)
CO2: 22 mmol/L (ref 22–32)
Calcium: 9.2 mg/dL (ref 8.9–10.3)
Chloride: 112 mmol/L — ABNORMAL HIGH (ref 98–111)
Creatinine, Ser: 0.6 mg/dL (ref 0.44–1.00)
GFR, Estimated: >60 mL/min (ref >60)
Glucose, Bld: 105 mg/dL — ABNORMAL HIGH (ref 70–99)
Potassium: 3.6 mmol/L (ref 3.5–5.1)
Sodium: 140 mmol/L (ref 135–145)

## 2024-05-16 LAB — LIPASE, BLOOD: Lipase: 105 U/L — ABNORMAL HIGH (ref 11–51)

## 2024-05-16 NOTE — Discharge Summary (Signed)
 DISCHARGE SUMMARY    Jamie Williams FMW:[id number] DOB: October 17, 1961 DOA: 05/14/2024  PCP: Kristina Tinnie POUR, PA-C  Admit date: 05/14/2024 Discharge date: 05/16/2024   Recommendations for Outpatient Follow-up:  Follow up with PCP in 1-2 weeks to continue chronic medication management   Hospital Course:  Jamie Williams is a 63 year old female with hypertension, anxiety, GERD, who presented with abdominal pain.  Workup revealed pancreatitis.  CT negative for gallstones.  Lipid panel from last month was without hypertriglyceridemia.  Patient does not drink alcohol.  She was treated for idiopathic pancreatitis and gradually improved.  By evaluation on 7/23 patient was tolerating a regular diet and reported pain was mostly resolved.  She has been discharged directly home today  Acute idiopathic pancreatitis - No gallstones, or hypertriglyceridemia.  - Is on lisinopril  at home, lipase improving despite continued use, will continue - Tolerating full diet now  Dyslipidemia - Continue statin  Hypertension - Continue home meds  Hypothyroidism - Continue home meds  Anxiety - Continue home meds  GERD - Continue PPI   Discharge Instructions  Discharge Instructions     Call MD for:  difficulty breathing, headache or visual disturbances   Complete by: As directed    Call MD for:  persistant dizziness or light-headedness   Complete by: As directed    Call MD for:  persistant nausea and vomiting   Complete by: As directed    Call MD for:  severe uncontrolled pain   Complete by: As directed    Call MD for:  temperature >100.4   Complete by: As directed    Diet general   Complete by: As directed    Discharge instructions   Complete by: As directed    Follow up with your primary care physician to discuss the medication changes during this admission   Increase activity slowly   Complete by: As directed       Allergies as of 05/16/2024       Reactions   Celebrex  [celecoxib] Other (See Comments)   Dizziness, light-headedness   Aspirin  Nausea And Vomiting   Penicillins Rash   Sulfa Antibiotics Hives        Medication List     TAKE these medications    aspirin  EC 81 MG tablet Take 1 tablet (81 mg total) by mouth daily. Swallow whole.   atorvastatin  80 MG tablet Commonly known as: LIPITOR Take 1 tablet (80 mg total) by mouth at bedtime.   cholecalciferol  1000 units tablet Commonly known as: VITAMIN D  Take 2,000 Units by mouth daily.   colchicine  0.6 MG tablet Take 2 tablets (1.2 mg) on first day of gout flare, then 1 tablet daily   cyclobenzaprine  10 MG tablet Commonly known as: FLEXERIL  Take 1 tablet (10 mg total) by mouth 3 (three) times daily as needed for muscle spasms. Take one tab po qhs for back spasm prn only   erythromycin  ophthalmic ointment Place 1 Application into the left eye at bedtime.   escitalopram  10 MG tablet Commonly known as: LEXAPRO  Take 1 tablet (10 mg total) by mouth daily.   hydrALAZINE  50 MG tablet Commonly known as: APRESOLINE  Take 1 tablet (50 mg total) by mouth 3 (three) times daily as needed (If systolic BP greater than 150 mmHg.). If systolic BP greater than 150 mmHg.   levothyroxine  50 MCG tablet Commonly known as: SYNTHROID  Take 1 tablet (50 mcg total) by mouth daily.   lisinopril  40 MG tablet Commonly known as: ZESTRIL  Take 1  tablet (40 mg total) by mouth daily.   nicotine  7 mg/24hr patch Commonly known as: NICODERM CQ  - dosed in mg/24 hr PLACE 1 PATCH ONTO THE SKIN DAILY   ondansetron  4 MG disintegrating tablet Commonly known as: ZOFRAN -ODT Take 1 tablet (4 mg total) by mouth every 8 (eight) hours as needed for nausea or vomiting.   pantoprazole  40 MG tablet Commonly known as: Protonix  Take 1 tablet (40 mg total) by mouth daily.   promethazine  25 MG tablet Commonly known as: PHENERGAN  TAKE 1 TABLET(25 MG) BY MOUTH EVERY 6 HOURS AS NEEDED FOR NAUSEA OR VOMITING   sucralfate  1  GM/10ML suspension Commonly known as: Carafate  Take 10 mLs (1 g total) by mouth every 8 (eight) hours as needed.   traMADol  50 MG tablet Commonly known as: ULTRAM  Take 1/2 -1 tablet by mouth daily as needed for pain        Allergies  Allergen Reactions   Celebrex [Celecoxib] Other (See Comments)    Dizziness, light-headedness   Aspirin  Nausea And Vomiting   Penicillins Rash   Sulfa Antibiotics Hives    Consultations:    Procedures/Studies: CT Angio Abd/Pel w/ and/or w/o Result Date: 05/14/2024 CLINICAL DATA:  Epigastric abdominal pain. EXAM: CTA ABDOMEN AND PELVIS WITHOUT AND WITH CONTRAST TECHNIQUE: Multidetector CT imaging of the abdomen and pelvis was performed using the standard protocol during bolus administration of intravenous contrast. Multiplanar reconstructed images and MIPs were obtained and reviewed to evaluate the vascular anatomy. RADIATION DOSE REDUCTION: This exam was performed according to the departmental dose-optimization program which includes automated exposure control, adjustment of the mA and/or kV according to patient size and/or use of iterative reconstruction technique. CONTRAST:  OMNIPAQUE  IOHEXOL  350 MG/ML SOLN COMPARISON:  Abdominopelvic CT 05/09/2024, right upper quadrant ultrasound earlier today FINDINGS: VASCULAR Aorta: Normal caliber aorta without aneurysm, dissection, vasculitis or significant stenosis. Mild to moderate calcified atheromatous plaque. Celiac: Patent without evidence of aneurysm, dissection, vasculitis or significant stenosis. SMA: Patent without evidence of aneurysm, dissection, vasculitis or significant stenosis. Renals: Both renal arteries are patent without evidence of aneurysm, dissection, vasculitis, fibromuscular dysplasia or significant stenosis. IMA: Occluded at the origin with distal reconstitution. Inflow: Patent without evidence of aneurysm, dissection, vasculitis or significant stenosis. Moderate calcified atheromatous  plaque. Proximal Outflow: Bilateral common femoral and visualized portions of the superficial and profunda femoral arteries are patent without evidence of aneurysm, dissection, vasculitis or significant stenosis. Veins: Venous phase imaging demonstrates patency of the portal, splenic, and mesenteric veins. No filling defects in the iliac veins or IVC. Review of the MIP images confirms the above findings. NON-VASCULAR Lower chest: Clear lung bases. Hepatobiliary: Unremarkable appearance of the liver. Layering density in the gallbladder likely represents vicarious excretion of contrast from prior exam, no sludge or stone seen on ultrasound earlier today. No pericholecystic inflammation. No biliary dilatation. Pancreas: Unremarkable. No pancreatic ductal dilatation or surrounding inflammatory changes. Spleen: Normal in size without focal abnormality. Adrenals/Urinary Tract: Normal adrenal glands. No hydronephrosis, renal calculi, or focal renal abnormality. Nondistended urinary bladder, limiting assessment. Stomach/Bowel: Wall thickening of the distal esophagus. No evidence of gastric wall thickening or inflammation. No small or large bowel inflammation or obstruction. Normal appendix. Small to moderate volume of stool in the colon. Lymphatic: No enlarged lymph nodes in the abdomen or pelvis. Reproductive: Status post hysterectomy. No adnexal masses. Other: No free air, free fluid, or intra-abdominal fluid collection. Tiny fat containing umbilical hernia. Musculoskeletal: L5-S1 degenerative disc disease. Lower lumbar facet hypertrophy. There are no  acute or suspicious osseous abnormalities. IMPRESSION: VASCULAR 1. No acute vascular findings. 2.  Aortic Atherosclerosis (ICD10-I70.0). NON-VASCULAR 1. No acute findings in the abdomen or pelvis. No explanation for pain. 2. Probable vicarious excretion of IV contrast within the gallbladder from prior CT. Electronically Signed   By: Andrea Gasman M.D.   On: 05/14/2024  16:02   US  ABDOMEN LIMITED RUQ (LIVER/GB) Result Date: 05/14/2024 CLINICAL DATA:  Right upper quadrant pain. EXAM: ULTRASOUND ABDOMEN LIMITED RIGHT UPPER QUADRANT COMPARISON:  July 11, 2012 FINDINGS: Gallbladder: No gallstones or wall thickening visualized (2.0 mm). No sonographic Murphy sign noted by sonographer. Common bile duct: Diameter: 3.6 mm Liver: No focal lesion identified. Diffusely increased echogenicity of the liver parenchyma is noted. Portal vein is patent on color Doppler imaging with normal direction of blood flow towards the liver. Other: None. IMPRESSION: Hepatic steatosis. Electronically Signed   By: Suzen Dials M.D.   On: 05/14/2024 00:57   CT ABDOMEN PELVIS W CONTRAST Result Date: 05/09/2024 CLINICAL DATA:  63 year old female with headache and pain. Status post left lateral thalamic/internal capsule lacunar infarcts last month. Epigastric pain radiating under the left breast with nausea. EXAM: CT ABDOMEN AND PELVIS WITH CONTRAST TECHNIQUE: Multidetector CT imaging of the abdomen and pelvis was performed using the standard protocol following bolus administration of intravenous contrast. RADIATION DOSE REDUCTION: This exam was performed according to the departmental dose-optimization program which includes automated exposure control, adjustment of the mA and/or kV according to patient size and/or use of iterative reconstruction technique. CONTRAST:  OMNIPAQUE  IOHEXOL  300 MG/ML  SOLN COMPARISON:  Noncontrast CT Abdomen and Pelvis 02/22/2018. FINDINGS: Lower chest: Negative. Normal heart size. No pericardial or pleural effusion. Hepatobiliary: Negative liver and gallbladder. Pancreas: Negative. Spleen: Negative. Adrenals/Urinary Tract: Normal adrenal glands. Kidneys appears symmetric and normal. Symmetric renal enhancement and normal contrast excretion on the delayed images. Diminutive ureters. No nephrolithiasis or inflammation identified. Decompressed bladder. Incidental  pelvic phleboliths. Stomach/Bowel: Nondilated large and small bowel. Fluid in the proximal colon to the distal transverse. No large bowel wall thickening or inflammation identified. Normal appendix on coronal image 41 caudal to the cecum. Decompressed terminal ileum. Stomach and duodenum appear negative. No pneumoperitoneum, free fluid, or mesenteric inflammation. Vascular/Lymphatic: Aortoiliac calcified atherosclerosis. Normal caliber abdominal aorta. Major arterial and portal venous structures are patent. No lymphadenopathy. Reproductive: Chronically absent uterus. Stable and normal left ovary along the left pelvic side wall. Normal right ovary suspected on series 2, image 61 closer to the pelvic inlet. Other: No pelvis free fluid. Musculoskeletal: Advanced disc and endplate degeneration at the lumbosacral junction with progressed vacuum disc there since 2019. No acute osseous abnormality identified. IMPRESSION: 1. No acute or inflammatory process identified in the abdomen or pelvis. Normal appendix. 2.  Aortic Atherosclerosis (ICD10-I70.0). Electronically Signed   By: VEAR Hurst M.D.   On: 05/09/2024 04:05   DG Chest Port 1 View Result Date: 05/09/2024 CLINICAL DATA:  Evaluate for possible free air EXAM: PORTABLE CHEST 1 VIEW COMPARISON:  CT from earlier in the same day FINDINGS: Cardiac shadow is within normal limits. Lungs are well aerated bilaterally. No focal infiltrate or effusion is seen. No bony abnormality is noted. No free air is noted in the upper abdomen. IMPRESSION: No evidence of free air. No significant interval change from the prior CT. Electronically Signed   By: Oneil Devonshire M.D.   On: 05/09/2024 04:02   CT Head Wo Contrast Result Date: 05/09/2024 CLINICAL DATA:  63 year old female with headache and pain.  Status post left lateral thalamic/internal capsule lacunar infarcts last month. Epigastric pain radiating under the left breast with nausea. EXAM: CT HEAD WITHOUT CONTRAST TECHNIQUE:  Contiguous axial images were obtained from the base of the skull through the vertex without intravenous contrast. RADIATION DOSE REDUCTION: This exam was performed according to the departmental dose-optimization program which includes automated exposure control, adjustment of the mA and/or kV according to patient size and/or use of iterative reconstruction technique. COMPARISON:  Brain MRI 03/26/2024. head CT 03/27/2024. FINDINGS: Brain: Expected small foci of cystic encephalomalacia corresponding to the left deep gray and white matter lacunar infarcts on MRI last month. Elsewhere gray-white differentiation is stable. Background cerebral volume remains normal for age. No midline shift, ventriculomegaly, mass effect, evidence of mass lesion, intracranial hemorrhage or evidence of cortically based acute infarction. Vascular: No suspicious intracranial vascular hyperdensity. Calcified atherosclerosis at the skull base. Skull: Previous suboccipital decompression. No acute osseous abnormality identified. Sinuses/Orbits: Visualized paranasal sinuses and mastoids are stable and well aerated. Other: No acute orbit or scalp soft tissue finding. IMPRESSION: 1.  No acute intracranial abnormality. 2. Expected evolution of the Left deep gray and white matter lacunar infarcts on MRI last month. 3. Previous suboccipital decompression. Electronically Signed   By: VEAR Hurst M.D.   On: 05/09/2024 03:58      Discharge Exam: Vitals:   05/16/24 0430 05/16/24 0913  BP: (!) 153/81 (!) 171/85  Pulse: (!) 56 (!) 56  Resp: 16 17  Temp: 98.5 F (36.9 C) 98 F (36.7 C)  SpO2: (!) 89% 93%   Vitals:   05/15/24 1701 05/15/24 1947 05/16/24 0430 05/16/24 0913  BP: (!) 176/94 (!) 160/77 (!) 153/81 (!) 171/85  Pulse: 63 69 (!) 56 (!) 56  Resp: 16 16 16 17   Temp:  98.4 F (36.9 C) 98.5 F (36.9 C) 98 F (36.7 C)  TempSrc:  Oral Oral Oral  SpO2: 99% 100% (!) 89% 93%  Weight:      Height:        Constitutional:  Normal  appearance. Non toxic-appearing.  HENT: Head Normocephalic and atraumatic.  Mucous membranes are moist.  Eyes:  Extraocular intact. Conjunctivae normal.  Cardiovascular: Rate and Rhythm: Normal rate and regular rhythm.  Abdomen: Nontender to palpation, nondistended. Pulmonary: Non labored, symmetric rise of chest wall.  Skin: warm and dry. not jaundiced.  Neurological: No focal deficit present. alert. Oriented.  Psychiatric: Mood and Affect congruent.    The results of significant diagnostics from this hospitalization (including imaging, microbiology, ancillary and laboratory) are listed below for reference.     Microbiology: No results found for this or any previous visit (from the past 240 hours).   Labs: BNP (last 3 results) No results for input(s): BNP in the last 8760 hours. Basic Metabolic Panel: Recent Labs  Lab 05/14/24 0017 05/15/24 0507 05/16/24 0319  NA 145 142 140  K 4.5 3.9 3.6  CL 110 110 112*  CO2 20* 22 22  GLUCOSE 134* 94 105*  BUN 9 5* 5*  CREATININE 0.93 0.73 0.60  CALCIUM  10.0 9.3 9.2   Liver Function Tests: Recent Labs  Lab 05/14/24 0017 05/15/24 0507  AST 19 14*  ALT 12 11  ALKPHOS 114 98  BILITOT 1.1 1.0  PROT 7.2 6.1*  ALBUMIN 4.0 3.4*   Recent Labs  Lab 05/14/24 0017 05/14/24 0658 05/15/24 0507 05/16/24 0319  LIPASE 199* 153* 121* 105*   No results for input(s): AMMONIA in the last 168 hours. CBC: Recent Labs  Lab 05/14/24 0017 05/15/24 0507  WBC 10.0 7.5  HGB 14.4 12.6  HCT 43.5 38.4  MCV 85.3 86.3  PLT 340 313   Cardiac Enzymes: No results for input(s): CKTOTAL, CKMB, CKMBINDEX, TROPONINI in the last 168 hours. BNP: Invalid input(s): POCBNP CBG: No results for input(s): GLUCAP in the last 168 hours. D-Dimer No results for input(s): DDIMER in the last 72 hours. Hgb A1c No results for input(s): HGBA1C in the last 72 hours. Lipid Profile No results for input(s): CHOL, HDL, LDLCALC, TRIG,  CHOLHDL, LDLDIRECT in the last 72 hours. Thyroid  function studies No results for input(s): TSH, T4TOTAL, T3FREE, THYROIDAB in the last 72 hours.  Invalid input(s): FREET3 Anemia work up No results for input(s): VITAMINB12, FOLATE, FERRITIN, TIBC, IRON, RETICCTPCT in the last 72 hours. Urinalysis    Component Value Date/Time   COLORURINE YELLOW (A) 05/14/2024 1200   APPEARANCEUR CLEAR (A) 05/14/2024 1200   APPEARANCEUR Hazy 11/23/2014 1932   LABSPEC 1.019 05/14/2024 1200   LABSPEC 1.016 11/23/2014 1932   PHURINE 6.0 05/14/2024 1200   GLUCOSEU NEGATIVE 05/14/2024 1200   GLUCOSEU Negative 11/23/2014 1932   HGBUR NEGATIVE 05/14/2024 1200   BILIRUBINUR NEGATIVE 05/14/2024 1200   BILIRUBINUR Neg 05/10/2017 1612   BILIRUBINUR Negative 11/23/2014 1932   KETONESUR NEGATIVE 05/14/2024 1200   PROTEINUR NEGATIVE 05/14/2024 1200   UROBILINOGEN 0.2 05/10/2017 1612   NITRITE NEGATIVE 05/14/2024 1200   LEUKOCYTESUR NEGATIVE 05/14/2024 1200   LEUKOCYTESUR Negative 11/23/2014 1932   Sepsis Labs Recent Labs  Lab 05/14/24 0017 05/15/24 0507  WBC 10.0 7.5   Microbiology No results found for this or any previous visit (from the past 240 hours).   Time coordinating discharge: 32 min   SIGNED: Lorane Poland, MD  [facility] 05/16/2024, 2:02 PM Pager   If 7PM-7AM, please contact night-coverage

## 2024-05-16 NOTE — Hospital Course (Signed)
 Jamie Williams is a 63 year old female with hypertension, anxiety, GERD, who presented with abdominal pain.  Workup revealed pancreatitis.  CT negative for gallstones.  Lipid panel from last month was without hypertriglyceridemia.  Patient does not drink alcohol.  She was treated for idiopathic pancreatitis and gradually improved.  By evaluation on 7/23 patient was tolerating a regular diet and reported pain was mostly resolved.  She has been discharged directly home today  Acute idiopathic pancreatitis - No gallstones, or hypertriglyceridemia.  - Is on lisinopril  at home, lipase improving despite continued use, will continue - Tolerating full diet now  Dyslipidemia - Continue statin  Hypertension - Continue home meds  Hypothyroidism - Continue home meds  Anxiety - Continue home meds  GERD - Continue PPI

## 2024-05-16 NOTE — Plan of Care (Signed)

## 2024-05-16 NOTE — TOC CM/SW Note (Signed)
 Transition of Care [facility]) - Inpatient Brief Assessment   Patient Details  Name: KINZLEE SELVY MRN: [id number] Date of Birth: 1961-07-01  Transition of Care [facility]) CM/SW Contact:    Asberry CHRISTELLA Jaksch, RN Phone Number: 05/16/2024, 2:23 PM   Clinical Narrative:   Transition of Care [facility]) Screening Note   Patient Details  Name: KADYNCE BONDS Date of Birth: 13-Jul-1961   Transition of Care [facility]) CM/SW Contact:    Asberry CHRISTELLA Jaksch, RN Phone Number: 05/16/2024, 2:24 PM    Transition of Care Department [facility]) has reviewed patient and no TOC needs have been identified at this time. If new patient transition needs arise, please place a TOC consult.    Transition of Care Asessment: Insurance and Status: Insurance coverage has been reviewed Patient has primary care physician: Yes     Prior/Current Home Services: No current home services Social Drivers of Health Review: SDOH reviewed no interventions necessary Readmission risk has been reviewed: Yes Transition of care needs: no transition of care needs at this time

## 2024-05-16 NOTE — Progress Notes (Signed)
 Patient discharged with discharge packet, IV removed, and all personal belongings were taken with her for discharge.

## 2024-05-17 ENCOUNTER — Ambulatory Visit

## 2024-05-17 ENCOUNTER — Inpatient Hospital Stay: Admitting: Physician Assistant

## 2024-05-22 ENCOUNTER — Encounter: Admitting: Occupational Therapy

## 2024-05-22 ENCOUNTER — Ambulatory Visit: Admitting: Physical Therapy

## 2024-05-24 ENCOUNTER — Encounter: Admitting: Occupational Therapy

## 2024-05-24 ENCOUNTER — Ambulatory Visit

## 2024-05-28 ENCOUNTER — Ambulatory Visit (INDEPENDENT_AMBULATORY_CARE_PROVIDER_SITE_OTHER): Admitting: Nurse Practitioner

## 2024-05-28 ENCOUNTER — Ambulatory Visit

## 2024-05-28 ENCOUNTER — Encounter: Payer: Self-pay | Admitting: Nurse Practitioner

## 2024-05-28 VITALS — BP 151/86 | HR 64 | Temp 97.5°F | Resp 16 | Ht 65.0 in | Wt 170.0 lb

## 2024-05-28 DIAGNOSIS — Z8673 Personal history of transient ischemic attack (TIA), and cerebral infarction without residual deficits: Secondary | ICD-10-CM

## 2024-05-28 DIAGNOSIS — K8500 Idiopathic acute pancreatitis without necrosis or infection: Secondary | ICD-10-CM | POA: Diagnosis not present

## 2024-05-28 DIAGNOSIS — Z09 Encounter for follow-up examination after completed treatment for conditions other than malignant neoplasm: Secondary | ICD-10-CM

## 2024-05-28 DIAGNOSIS — R051 Acute cough: Secondary | ICD-10-CM

## 2024-05-28 MED ORDER — BENZONATATE 200 MG PO CAPS: 200.0000 mg | ORAL_CAPSULE | Freq: Three times a day (TID) | ORAL | 2 refills | Status: DC | PRN

## 2024-05-28 NOTE — Progress Notes (Signed)
 [facility] Jamie Williams, Jamie Williams [address] [place] [address] [phone]                                   Transitional Care Clinic   [facility] Discharge Acute Issues Care Follow Up                                                                        Patient Demographics  Jamie Williams, is a 63 y.o. female  DOB Mar 11, 1961  MRN [id number].  Primary MD  Kristina Tinnie POUR, PA-C  Admit date: 05/14/2024 Discharge date: 05/16/2024  Reason for TCC follow Up - idiopathic acute pancreatitis.    Past Medical History:  Diagnosis Date   Anxiety    Family history of adverse reaction to anesthesia    sister had a difficult time waking up from mastectomy and passed away    GERD (gastroesophageal reflux disease)    History of chicken pox    History of kidney stones    Hyperlipidemia    Hypertension    PONV (postoperative nausea and vomiting)    nausea   Sciatic nerve pain     Past Surgical History:  Procedure Laterality Date   ANTERIOR CERVICAL DECOMP/DISCECTOMY FUSION N/A 12/24/2015   Procedure: Cervical five-six, Cervical six-seven  Anterior cervical decompression/diskectomy/fusion/interbody prosthesis/plate;  Surgeon: Reyes Budge, MD;  Location: [facility] NEURO ORS;  Service: Neurosurgery;  Laterality: N/A;   COLONOSCOPY WITH PROPOFOL  N/A 07/17/2018   Procedure: COLONOSCOPY WITH PROPOFOL ;  Surgeon: Therisa Bi, MD;  Location: [facility] ENDOSCOPY;  Service: Gastroenterology;  Laterality: N/A;   ECTOPIC PREGNANCY SURGERY  1991 and 1996   two Etopic preg.   Head Injuries  2006   surgery 2006 Cram/NS in [place]. Headaches with increased intracranial pressure. Repeat MRI in 2008 Negative   SUPRACERVICAL ABDOMINAL HYSTERECTOMY  2011   Abdominal; Ovaries intact; CERVIX INTACT. Fibroids/dys menorrhea. Weaver-Lee   Ulnar Neuropathy Right 2004   Ulnar Neuropathy surgical revision. 2004-2008. [place].       Recent HPI and Hospital Course  Hospital  Course:   Jamie Williams is a 63 year old female with hypertension, anxiety, GERD, who presented with abdominal pain.  Workup revealed pancreatitis.  CT negative for gallstones.  Lipid panel from last month was without hypertriglyceridemia.  Patient does not drink alcohol.  She was treated for idiopathic pancreatitis and gradually improved.  By evaluation on 7/23 patient was tolerating a regular diet and reported pain was mostly resolved.  She has been discharged directly home today   Acute idiopathic pancreatitis - No gallstones, or hypertriglyceridemia.  - Is on lisinopril  at home, lipase improving despite continued use, will continue - Tolerating full diet now   Dyslipidemia - Continue statin   Hypertension - Continue home meds   Hypothyroidism - Continue home meds   Anxiety - Continue home meds   GERD - Continue PPI    Post Hospital Acute Care Issue to be followed in the Clinic  History of stroke  Hypertension High cholesterol   Subjective:   Jamie Williams today has, No headache, No chest pain, No abdominal pain - No Nausea, No new weakness tingling  or numbness, No Cough - SOB.   Assessment & Plan   1. Idiopathic acute pancreatitis without infection or necrosis (Primary) Treated in the hospital for 2 days, feeling much better, no issues   2. Acute cough Reports a dry hacking cough since being started on lisinopril . Cough medication prescribed for now but patient will discuss the medication with her PCP at her upcoming appt in a couple of weeks.   3. History of stroke Recent history of stroke earlier this year. Minimal deficits   4. Hospital discharge follow-up Treated in hospital for pancreatitis.    Reason for frequent admissions/ER visits    history of stroke pancreatitis   Objective:   Vitals:   05/28/24 1110  BP: (!) 151/86  Pulse: 64  Resp: 16  Temp: (!) 97.5 F (36.4 C)  SpO2: 99%  Weight: 170 lb (77.1 kg)  Height: 5' 5 (1.651 m)     Wt Readings from Last 3 Encounters:  05/28/24 170 lb (77.1 kg)  05/14/24 176 lb (79.8 kg)  05/09/24 176 lb (79.8 kg)    Allergies as of 05/28/2024       Reactions   Celebrex [celecoxib] Other (See Comments)   Dizziness, light-headedness   Aspirin  Nausea And Vomiting   Penicillins Rash   Sulfa Antibiotics Hives        Medication List        Accurate as of May 28, 2024  4:43 PM. If you have any questions, ask your nurse or doctor.          aspirin  EC 81 MG tablet Take 1 tablet (81 mg total) by mouth daily. Swallow whole.   atorvastatin  80 MG tablet Commonly known as: LIPITOR Take 1 tablet (80 mg total) by mouth at bedtime.   benzonatate  200 MG capsule Commonly known as: TESSALON  Take 1 capsule (200 mg total) by mouth 3 (three) times daily as needed for cough. Started by: Dal Blew   cholecalciferol  1000 units tablet Commonly known as: VITAMIN D  Take 2,000 Units by mouth daily.   colchicine  0.6 MG tablet Take 2 tablets (1.2 mg) on first day of gout flare, then 1 tablet daily   cyclobenzaprine  10 MG tablet Commonly known as: FLEXERIL  Take 1 tablet (10 mg total) by mouth 3 (three) times daily as needed for muscle spasms. Take one tab po qhs for back spasm prn only   erythromycin  ophthalmic ointment Place 1 Application into the left eye at bedtime.   escitalopram  10 MG tablet Commonly known as: LEXAPRO  Take 1 tablet (10 mg total) by mouth daily.   hydrALAZINE  50 MG tablet Commonly known as: APRESOLINE  Take 1 tablet (50 mg total) by mouth 3 (three) times daily as needed (If systolic BP greater than 150 mmHg.). If systolic BP greater than 150 mmHg.   levothyroxine  50 MCG tablet Commonly known as: SYNTHROID  Take 1 tablet (50 mcg total) by mouth daily.   lisinopril  40 MG tablet Commonly known as: ZESTRIL  Take 1 tablet (40 mg total) by mouth daily.   nicotine  7 mg/24hr patch Commonly known as: NICODERM CQ  - dosed in mg/24 hr PLACE 1 PATCH ONTO  THE SKIN DAILY   ondansetron  4 MG disintegrating tablet Commonly known as: ZOFRAN -ODT Take 1 tablet (4 mg total) by mouth every 8 (eight) hours as needed for nausea or vomiting.   pantoprazole  40 MG tablet Commonly known as: Protonix  Take 1 tablet (40 mg total) by mouth daily.   promethazine  25 MG tablet Commonly known as: PHENERGAN  TAKE  1 TABLET(25 MG) BY MOUTH EVERY 6 HOURS AS NEEDED FOR NAUSEA OR VOMITING   sucralfate  1 GM/10ML suspension Commonly known as: Carafate  Take 10 mLs (1 g total) by mouth every 8 (eight) hours as needed.   traMADol  50 MG tablet Commonly known as: ULTRAM  Take 1/2 -1 tablet by mouth daily as needed for pain         Physical Exam: Constitutional: Patient appears well-developed and well-nourished. Not in obvious distress. HENT: Normocephalic, atraumatic, External right and left ear normal. Oropharynx is clear and moist.  Eyes: Conjunctivae and EOM are normal. PERRLA, no scleral icterus. Neck: Normal ROM. Neck supple. No JVD. No tracheal deviation. No thyromegaly. CVS: RRR, S1/S2 +, no murmurs, no gallops, no carotid bruit.  Pulmonary: Effort and breath sounds normal, no stridor, rhonchi, wheezes, rales.  Abdominal: Soft. BS +, no distension, tenderness, rebound or guarding.  Musculoskeletal: Normal range of motion. No edema and no tenderness.  Lymphadenopathy: No lymphadenopathy noted, cervical, inguinal or axillary Neuro: Alert. Normal reflexes, muscle tone coordination. No cranial nerve deficit. Skin: Skin is warm and dry. No rash noted. Not diaphoretic. No erythema. No pallor. Psychiatric: Normal mood and affect. Behavior, judgment, thought content normal.   Data Review   Micro Results No results found for this or any previous visit (from the past 240 hours).   CBC No results for input(s): WBC, HGB, HCT, PLT, MCV, MCH, MCHC, RDW, LYMPHSABS, MONOABS, EOSABS, BASOSABS, BANDABS in the last 168 hours.  Invalid  input(s): NEUTRABS, BANDSABD  Chemistries  No results for input(s): NA, K, CL, CO2, GLUCOSE, BUN, CREATININE, CALCIUM , MG, AST, ALT, ALKPHOS, BILITOT in the last 168 hours.  Invalid input(s): GFRCGP ------------------------------------------------------------------------------------------------------------------ estimated creatinine clearance is 75.8 mL/min (by C-G formula based on SCr of 0.6 mg/dL). ------------------------------------------------------------------------------------------------------------------ No results for input(s): HGBA1C in the last 72 hours. ------------------------------------------------------------------------------------------------------------------ No results for input(s): CHOL, HDL, LDLCALC, TRIG, CHOLHDL, LDLDIRECT in the last 72 hours. ------------------------------------------------------------------------------------------------------------------ No results for input(s): TSH, T4TOTAL, T3FREE, THYROIDAB in the last 72 hours.  Invalid input(s): FREET3 ------------------------------------------------------------------------------------------------------------------ No results for input(s): VITAMINB12, FOLATE, FERRITIN, TIBC, IRON, RETICCTPCT in the last 72 hours.  Coagulation profile No results for input(s): INR, PROTIME in the last 168 hours.  No results for input(s): DDIMER in the last 72 hours.  Cardiac Enzymes No results for input(s): CKMB, TROPONINI, MYOGLOBIN in the last 168 hours.  Invalid input(s): CK ------------------------------------------------------------------------------------------------------------------ Invalid input(s): POCBNP  Return for previously scheduled, F/U with lauren pcp in a couple of weeks.   Time Spent in minutes  45 Time spent with patient included reviewing progress notes, labs, imaging studies, and discussing plan for follow up.    This patient was seen by Mardy Maxin, FNP-C in collaboration with Dr. Sigrid Bathe as a part of collaborative care agreement.    Mardy Maxin MSN, FNP-C on 05/28/2024 at 3:21 PM   **Disclaimer: This note may have been dictated with voice recognition software. Similar sounding words can inadvertently be transcribed and this note may contain transcription errors which may not have been corrected upon publication of note.**

## 2024-05-29 ENCOUNTER — Telehealth: Payer: Self-pay | Admitting: Physician Assistant

## 2024-05-29 NOTE — Telephone Encounter (Signed)
 Received after hospital discharge PT/OT orders from [facility]. Gave to [employer]

## 2024-05-30 ENCOUNTER — Ambulatory Visit: Admitting: Physical Therapy

## 2024-05-30 ENCOUNTER — Ambulatory Visit

## 2024-05-31 ENCOUNTER — Telehealth: Payer: Self-pay | Admitting: Physician Assistant

## 2024-05-31 ENCOUNTER — Ambulatory Visit: Admitting: Physician Assistant

## 2024-05-31 NOTE — Telephone Encounter (Signed)
 hospital discharge PT/OT orders signed. Faxed back to [facility]; [phone]. Scanned-Toni

## 2024-06-05 ENCOUNTER — Ambulatory Visit: Admitting: Occupational Therapy

## 2024-06-05 ENCOUNTER — Ambulatory Visit: Admitting: Physical Therapy

## 2024-06-05 ENCOUNTER — Telehealth: Payer: Self-pay

## 2024-06-05 NOTE — Therapy (Incomplete)
 OUTPATIENT OCCUPATIONAL THERAPY NEURO EVALUATION  Patient Name: Jamie Williams MRN: [id number] DOB:1961-08-23, 63 y.o., female Today's Date: 06/05/2024  PCP: Kristina Tinnie POUR, PA-C REFERRING PROVIDER: Liana Fish, NP  END OF SESSION:       Past Medical History:  Diagnosis Date   Anxiety    Family history of adverse reaction to anesthesia    sister had a difficult time waking up from mastectomy and passed away    GERD (gastroesophageal reflux disease)    History of chicken pox    History of kidney stones    Hyperlipidemia    Hypertension    PONV (postoperative nausea and vomiting)    nausea   Sciatic nerve pain    Past Surgical History:  Procedure Laterality Date   ANTERIOR CERVICAL DECOMP/DISCECTOMY FUSION N/A 12/24/2015   Procedure: Cervical five-six, Cervical six-seven  Anterior cervical decompression/diskectomy/fusion/interbody prosthesis/plate;  Surgeon: Reyes Budge, MD;  Location: [facility] NEURO ORS;  Service: Neurosurgery;  Laterality: N/A;   COLONOSCOPY WITH PROPOFOL  N/A 07/17/2018   Procedure: COLONOSCOPY WITH PROPOFOL ;  Surgeon: Therisa Bi, MD;  Location: [facility] ENDOSCOPY;  Service: Gastroenterology;  Laterality: N/A;   ECTOPIC PREGNANCY SURGERY  1991 and 1996   two Etopic preg.   Head Injuries  2006   surgery 2006 Cram/NS in [place]. Headaches with increased intracranial pressure. Repeat MRI in 2008 Negative   SUPRACERVICAL ABDOMINAL HYSTERECTOMY  2011   Abdominal; Ovaries intact; CERVIX INTACT. Fibroids/dys menorrhea. Weaver-Lee   Ulnar Neuropathy Right 2004   Ulnar Neuropathy surgical revision. 2004-2008. [place].   Patient Active Problem List   Diagnosis Date Noted   Acute pancreatitis 05/14/2024   GERD without esophagitis 05/14/2024   Anxiety and depression 05/14/2024   History of stroke 05/14/2024   Acute ischemic vertebrobasilar artery thalamic stroke, left (HCC) 03/27/2024   Essential hypertension 03/27/2024   Hypothyroidism 03/27/2024    Dyslipidemia 03/27/2024   Peripheral neuropathy 03/27/2024   Ambulatory dysfunction 03/27/2024   Hypothyroid 07/22/2020   Chronic, continuous use of opioids 04/15/2020   Pap smear abnormality of cervix/human papillomavirus (HPV) positive 08/06/2019   Aortic atherosclerosis (HCC) 10/23/2018   GERD (gastroesophageal reflux disease)    Hot flashes due to menopause 01/10/2017   Cervical spondylosis with radiculopathy 12/24/2015   Compression injury of cervical spinal nerve 10/07/2015   Left arm pain 08/29/2015   Neck pain 08/29/2015   Hematuria 07/03/2015   Allergic arthritis 06/27/2015   Arthritis 06/27/2015   Back ache 06/27/2015   Clinical depression 06/27/2015   H/O abnormal cervical Papanicolaou smear 06/27/2015   Elevated intracranial pressure 06/27/2015   Kidney stones 06/27/2015   Knee pain 06/27/2015   Abnormal mammogram 06/27/2015   Headache, migraine 06/27/2015   Neuropathy 06/27/2015   Hand paresthesia 06/27/2015   Pre-diabetes 06/27/2015   Vitamin D  deficiency 06/27/2015   Weight loss 06/27/2015   Adjustment reaction 06/27/2015   Hypercholesterolemia without hypertriglyceridemia 01/29/2010   Tobacco abuse 01/28/2010   Primary hypertension 09/16/2009   Insomnia 09/16/2009   Lesion of ulnar nerve 09/16/2009    ONSET DATE: 03/26/24  REFERRING DIAG: CVA  THERAPY DIAG:  Weakness, lack of coordination  Rationale for Evaluation and Treatment: Rehabilitation  SUBJECTIVE:   SUBJECTIVE STATEMENT:  Pt. *** Pt accompanied by: self  PERTINENT HISTORY:   Pt. Is a 63 y.o. female who was admitted to the hospital with an acute onset of back pain, trouble walking RUE and LE weakness. Imaging revealed an acute ischemic vertebrobasilar artery thalamic stroke, and Degenerative Changes of the Lumbar  spine  L5-S1. PHMx includes: Peripheral neuropathy, Essential HTN, Hypothyroidism, Dyslipidemia, Ambulatory Dysfunction. Pt. Has a history of neck, and right shoulder pain with  a planned MRI in August 2025. Pt. Has a History of remote right elbow injury from working in the [place].   PRECAUTIONS: None  WEIGHT BEARING RESTRICTIONS: No  PAIN:  Are you having pain? 7/10 Sciatica; sometimes reaches a 10/10 at home  FALLS: Has patient fallen in last 6 months? No  LIVING ENVIRONMENT: Lives with: lives with their spouse and lives with their son Lives in: House/apartment Stairs:  3 steps to enter; one level home Has following equipment at home: Quad cane small base, 2 wheeled walker BSCommode  PLOF: Independent  PATIENT GOALS: To get back to her PLOF  OBJECTIVE:  Note: Objective measures were completed at Evaluation unless otherwise noted.  HAND DOMINANCE: Right  ADLs:  Transfers/ambulation related to ADLs: Independent Eating: Independent, increased time required, loses grip on a glass Grooming: Independent UB Dressing: Independent, difficulty with buttoning LB Dressing: independent Toileting: Independent Bathing: Independent  Tub Shower transfers: Independent; distance supervision -showers when family is home   IADLs: Shopping: Sister has been doing the grocery shopping  Light housekeeping: Writer,  has not washed dishes, independent with bedmaking increased time  2/2 decreased balance Meal Prep: Independent Community mobility: Relies on family and friends Medication management: difficulty using the dominant right hand to open bottles, and manipulate medication Financial management: No changes in the process Handwriting: 50% legible for name only 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 Hobbies: Crafts  MOBILITY STATUS: Independent without an assistive device    ACTIVITY TOLERANCE: Activity tolerance: WFL  FUNCTIONAL OUTCOME MEASURES: MAM Measure Sum Score: 69   UPPER EXTREMITY ROM:    Active ROM Right Eval 04/19/24 Left Eval 04/19/24  Shoulder flexion 92(103) WFL  Shoulder abduction 82(90) [facility]   Shoulder adduction    Shoulder extension    Shoulder internal rotation    Shoulder external rotation    Elbow flexion [facility] WFL  Elbow extension [facility]  Wrist flexion [facility] WFL  Wrist extension [facility] WFL  Wrist ulnar deviation    Wrist radial deviation    Wrist pronation    Wrist supination    (Blank rows = not tested)  UPPER EXTREMITY MMT:     MMT Right Eval 04/19/24 Left Eval 04/19/24  Shoulder flexion 3-/5 5/5  Shoulder abduction 3-/5 5/5  Shoulder adduction    Shoulder extension    Shoulder internal rotation    Shoulder external rotation    Middle trapezius    Lower trapezius    Elbow flexion 4/5 5/5  Elbow extension 4/5 5/5  Wrist flexion 4/5 5/5  Wrist extension 4/5 5/5  Wrist ulnar deviation    Wrist radial deviation    Wrist pronation    Wrist supination    (Blank rows = not tested)  HAND FUNCTION: Grip strength: Right: 63 lbs; Left: 75 lbs, Lateral pinch: Right: 15 lbs, Left: 16 lbs, and 3 point pinch: Right: 14 lbs, Left: 17 lbs  COORDINATION: 9 Hole Peg test: Right: 27 sec; Left: 22 sec  SENSATION: tingling/numb WFL Light touch: WFL Proprioception: WFL  EDEMA: WFL   COGNITION: Overall cognitive status: Within functional limits for tasks assessed; Pt. Reports that sometimes she can remember things, and sometimes  VISION: Subjective report: blurriness since the stroke   PRAXIS: Impaired fine motor control   TREATMENT DATE: 06/05/24     PATIENT EDUCATION: Education  details: OT services, POC, goals, ADL, and IADL functioning Person educated: Patient Education method: Explanation, Demonstration, and Tactile cues Education comprehension: verbalized understanding, returned demonstration, verbal cues required, tactile cues required, and needs further education  HOME EXERCISE PROGRAM:  Continue to provide as indicated  GOALS: Goals reviewed with patient? yes  SHORT TERM GOALS: Target date:  05/31/2024  Pt. Will be independent with HEPs for  the RUE. Baseline: Eval: No current HEP Goal status: INITIAL  LONG TERM GOALS: Target date: 07/12/2024  Pt. will be improve the MAM-20 score by 2 points to reflect progress with hand function during ADL/IADL tasks. Baseline: Eval: MAM-20 Sum score: 69 Goal status: INITIAL  2.  Pt. Will improve Right shoulder ROM by 5 degrees to be able reaching into cabinetry. Baseline: Eval: Right shoulder flexion: 92(103), Abduction: 82 (90)  Goal status: INITIAL  3.  Pt. Will improve RUE strength by 2 muscle grades to assist with ADLs, and IADLs. Baseline: Eval: Right shoulder flexion: 3-/5, abduction: 3-/5, elbow flexion: 4/5, extension: 4/5, wrist flexion: 4/5, extension: 4/5 Goal status: INITIAL  4.  Pt. Will increase right grip strength by 5# to be able to securely hold objects Baseline: Eval: Right: 63#, L: 75# Goal status: INITIAL  5.  Pt. Will increase right pinch strength by 3# to be able to open jars, bottles, containers Baseline: Eval: Lateral pinch: right: 15#, left: 16#; 3pt. Pinch: right: 14#, left: 17# Goal status: INITIAL  6.  Pt. Will improve right hand [facility] skills by 3 sec. Of speed to be able to efficiently manipulate small objects/buttons. Baseline: Right: 27 sec. Left: 22 sec. Goal status: INITIAL  6.  Pt. Will write 3 sentences efficiently with 75% legibility in preparation for written correspondence Baseline: Eval: name only: 50% legibility  Goal status: INITIAL   ASSESSMENT:  CLINICAL IMPRESSION:  Patient is a 63 y.o. female who was seen today for occupational therapy evaluation for CVA.  Pt. presents with decreased right shoulder ROM, limited RUE strength, decreased right grip strength, pinch strength, and impaired [facility] skills which limit her ability to efficiently complete daily ADL, and IADL tasks including: clipping nails, buttoning, opening bottles/jars/containers, securely holding a glass, and reaching up to higher shelves. MAM-20 score sum score: 69. Pt. will  benefit from OT services to improve right hand function in order to work towards improving, and maximizing engagement in, and  independence with ADLs, and IADL tasks.   PERFORMANCE DEFICITS: in functional skills including ADLs, IADLs, coordination, dexterity, proprioception, ROM, strength, Fine motor control, Gross motor control, vision, and UE functional use, pain.cognitive skills including memory, and psychosocial skills coping strategies, environmental adaptation, interpersonal interactions, and routines and behaviors.   IMPAIRMENTS: are limiting patient from ADLs, IADLs, rest and sleep, and leisure.   CO-MORBIDITIES: may have co-morbidities  that affects occupational performance. Patient will benefit from skilled OT to address above impairments and improve overall function.  MODIFICATION OR ASSISTANCE TO COMPLETE EVALUATION: Min-Moderate modification of tasks or assist with assess necessary to complete an evaluation.  OT OCCUPATIONAL PROFILE AND HISTORY: Detailed assessment: Review of records and additional review of physical, cognitive, psychosocial history related to current functional performance.  CLINICAL DECISION MAKING: Moderate - several treatment options, min-mod task modification necessary  REHAB POTENTIAL: Good  EVALUATION COMPLEXITY: Moderate    PLAN:  OT FREQUENCY: 2x/week  OT DURATION: 12 weeks  PLANNED INTERVENTIONS: 97168 OT Re-evaluation, 97535 self care/ADL training, 02889 therapeutic exercise, 97530 therapeutic activity, 97112 neuromuscular re-education, 97140 manual therapy, 97018 paraffin, 02989  moist heat, 97034 contrast bath, passive range of motion, functional mobility training, patient/family education, and DME and/or AE instructions  RECOMMENDED OTHER SERVICES: OT  CONSULTED AND AGREED WITH PLAN OF CARE: Patient  PLAN FOR NEXT SESSION:   Treatment  Elston Slot, M.S. OTR/L  06/05/24, 7:52 AM  ascom [phone]  06/05/2024, 7:51 AM

## 2024-06-05 NOTE — Therapy (Deleted)
 OUTPATIENT PHYSICAL THERAPY NEURO TREATMENT   Patient Name: Jamie Williams MRN: [id number] DOB:[birth year], 63 y.o., female Today's Date: 06/05/2024   PCP: Kristina Tinnie POUR, PA-C REFERRING PROVIDER: Liana Fish, NP  END OF SESSION:     Past Medical History:  Diagnosis Date   Anxiety    Family history of adverse reaction to anesthesia    sister had a difficult time waking up from mastectomy and passed away    GERD (gastroesophageal reflux disease)    History of chicken pox    History of kidney stones    Hyperlipidemia    Hypertension    PONV (postoperative nausea and vomiting)    nausea   Sciatic nerve pain    Past Surgical History:  Procedure Laterality Date   ANTERIOR CERVICAL DECOMP/DISCECTOMY FUSION N/A 12/24/2015   Procedure: Cervical five-six, Cervical six-seven  Anterior cervical decompression/diskectomy/fusion/interbody prosthesis/plate;  Surgeon: Reyes Budge, MD;  Location: [facility];  Service: Neurosurgery;  Laterality: N/A;   COLONOSCOPY WITH PROPOFOL  N/A 07/17/2018   Procedure: COLONOSCOPY WITH PROPOFOL ;  Surgeon: Therisa Bi, MD;  Location: [facility] ENDOSCOPY;  Service: Gastroenterology;  Laterality: N/A;   ECTOPIC PREGNANCY SURGERY  1991 and 1996   two Etopic preg.   Head Injuries  2006   surgery 2006 Cram/NS in [place]. Headaches with increased intracranial pressure. Repeat MRI in 2008 Negative   SUPRACERVICAL ABDOMINAL HYSTERECTOMY  2011   Abdominal; Ovaries intact; CERVIX INTACT. Fibroids/dys menorrhea. Weaver-Lee   Ulnar Neuropathy Right 2004   Ulnar Neuropathy surgical revision. 2004-2008. [place].   Patient Active Problem List   Diagnosis Date Noted   Acute pancreatitis 05/14/2024   GERD without esophagitis 05/14/2024   Anxiety and depression 05/14/2024   History of stroke 05/14/2024   Acute ischemic vertebrobasilar artery thalamic stroke, left (HCC) 03/27/2024   Essential hypertension 03/27/2024   Hypothyroidism 03/27/2024    Dyslipidemia 03/27/2024   Peripheral neuropathy 03/27/2024   Ambulatory dysfunction 03/27/2024   Hypothyroid 07/22/2020   Chronic, continuous use of opioids 04/15/2020   Pap smear abnormality of cervix/human papillomavirus (HPV) positive 08/06/2019   Aortic atherosclerosis (HCC) 10/23/2018   GERD (gastroesophageal reflux disease)    Hot flashes due to menopause 01/10/2017   Cervical spondylosis with radiculopathy 12/24/2015   Compression injury of cervical spinal nerve 10/07/2015   Left arm pain 08/29/2015   Neck pain 08/29/2015   Hematuria 07/03/2015   Allergic arthritis 06/27/2015   Arthritis 06/27/2015   Back ache 06/27/2015   Clinical depression 06/27/2015   H/O abnormal cervical Papanicolaou smear 06/27/2015   Elevated intracranial pressure 06/27/2015   Kidney stones 06/27/2015   Knee pain 06/27/2015   Abnormal mammogram 06/27/2015   Headache, migraine 06/27/2015   Neuropathy 06/27/2015   Hand paresthesia 06/27/2015   Pre-diabetes 06/27/2015   Vitamin D  deficiency 06/27/2015   Weight loss 06/27/2015   Adjustment reaction 06/27/2015   Hypercholesterolemia without hypertriglyceridemia 01/29/2010   Tobacco abuse 01/28/2010   Primary hypertension 09/16/2009   Insomnia 09/16/2009   Lesion of ulnar nerve 09/16/2009    ONSET DATE: 03/26/2024  REFERRING DIAG:  Diagnosis  R26.89 (ICD-10-CM) - Impairment of balance  Z86.73 (ICD-10-CM) - History of recent stroke  I69.30 (ICD-10-CM) - Personal history of stroke with residual effects    THERAPY DIAG:  No diagnosis found.  Rationale for Evaluation and Treatment: Rehabilitation  SUBJECTIVE:  SUBJECTIVE STATEMENT: Pt reports she is still very hesitant returning to normal activities due to fear of imbalance. Pt also dealing with tough family  things.  From eval: The pt is a pleasant 63 y/o female presenting to PT eval s/p CVA. Pt reports onset of symptoms 03/26/2024 where she couldn't walk. She went to ED and was found to have had a stroke.  Pt reports when she first got home she started out using a RW and about a week later switched to a cane. Currently ambulating without an AD, but still uses SPC sometimes. Sometimes she can feel unbalanced when she first stands up and can stagger. Sometimes she feels lightheaded. She says if she's outside for too long she can feel dizzy/swimmy-headed. Pt reports poor strength, including in R hand (feels tinging, she is R hand dominant). She has some difficulty standing up from sitting, she uses her arms to help her. She has some difficulty getting out of bed.  Pt feels she's kind of cautious/scared now with certain activities due to unsteadiness.    Pt accompanied by: self  PERTINENT HISTORY:   Per chart PMH includes HTN, anxiety, dyslipidemia, GERD, peripheral neuropathy, hypothyroidism  PAIN:  Are you having pain? Lower L side back pain, reports chronic sciatica and arthritis-related pain  PRECAUTIONS: None  RED FLAGS: None   WEIGHT BEARING RESTRICTIONS: No  FALLS: Has patient fallen in last 6 months? No  LIVING ENVIRONMENT: Lives with: lives with their spouse and lives with their son Lives in: House/apartment Stairs: 3 steps (bilateral handrails) Has following equipment at home: Single point cane, Environmental consultant - 2 wheeled, and shower chair  PLOF: Independent  PATIENT GOALS: I want to get back to me   OBJECTIVE:  Note: Objective measures were completed at Evaluation unless otherwise noted.  DIAGNOSTIC FINDINGS: via chart  CT HEAD 03/27/24:  IMPRESSION: 1. No acute intracranial hemorrhage. 2. Left thalamic infarct, less well visualized than on the prior MRI. 3. No large vessel occlusion, hemodynamically significant stenosis, or aneurysm in the head or neck.   Electronically signed  by: Lonni Necessary MD 03/27/2024   MR brain 03/27/24: FINDINGS: Brain: Acute left thalamocapsular infarcts. Mild associated edema without mass effect. No midline shift. No evidence of acute hemorrhage, mass lesion, or hydrocephalus.   Vascular: Major arterial flow voids are maintained at the skull base.   Skull and upper cervical spine: Normal marrow signal.   Sinuses/Orbits: Clear sinuses.  No acute orbital findings.   Other: No mastoid effusions.   IMPRESSION: Acute left thalamocapsular infarcts.     Electronically Signed   By: Gilmore GORMAN Molt M.D.   On: 03/27/2024 00:01  COGNITION: Overall cognitive status: WFL for tasks assessed, pt does note some memory changes since her stroke    SENSATION: Reports numbness/tingling in R hand  COORDINATION: WFL LE rapid alt movement and heel>shin   EDEMA:  No swelling per pt report     POSTURE: slight increased thoracic kyphosis, elevated L shoulder     LOWER EXTREMITY MMT:    MMT Right Eval Left Eval  Hip flexion 4 4+  Hip extension    Hip abduction 4 4+  Hip adduction 4+ 4+  Hip internal rotation    Hip external rotation    Knee flexion 4+ 4+  Knee extension 5 5  Ankle dorsiflexion 4+ 4+  Ankle plantarflexion    Ankle inversion    Ankle eversion    (Blank rows = not tested)  Plantarflexion endurance test R 2 reps  vs L 10 reps (somewhat limited by L knee pain)  BED MOBILITY:  Somewhat impaired per pt report since CVA  TRANSFERS: Requires use of BUE   STAIRS: Reports she is not confident descend/ascending steps without handrails  GAIT: Findings: noted slight off-loading RLE and elevated L shoulder/side during gait   FUNCTIONAL TESTS:  5 times sit to stand: 23 seconds with use BUE to push off chair  10 meter walk test: 1.18 m/s noted slight off-loading RLE and elevated L shoulder/side during gait  Berg Balance Scale: 47/56    PATIENT SURVEYS:  ABC scale: The Activities-Specific Balance  Confidence (ABC) Scale score: 52.5%                                                                                                                               TREATMENT DATE: 06/05/24   TA- To improve functional movements patterns for everyday tasks   STS 2 x 10   Standing march 2 x 10   Standing hip abduction 2 x 10   Stair practice/ training  - step taps to 6 in step 2 x 10 ea LE - step ups with UE support x 10 ea LE  - step ups without UE assist but CGA 2 x 10 ea LE   NMR: To facilitate reeducation of movement, balance, posture, coordination, and/or proprioception/kinesthetic sense.  Static balance intervention  RLE on airex other on step x 30 sec, x 3 rounds - LLE on airex for 1 x 30 sec   PATIENT EDUCATION: Education details: exam findings, indications, goals, plan Person educated: Patient Education method: Explanation Education comprehension: verbalized understanding  HOME EXERCISE PROGRAM: Access Code: [facility] URL: [email or web address] Date: 04/26/2024 Prepared by: Lonni Gainer  Exercises - Sit to Stand  - 1 x daily - 7 x weekly - 2 sets - 10 reps - Marching Near Counter  - 1 x daily - 7 x weekly - 2 sets - 10 reps - Standing Hip Abduction with Unilateral Counter Support  - 1 x daily - 7 x weekly - 2 sets - 10 reps  GOALS: Goals reviewed with patient? Yes   SHORT TERM GOALS: Target date: 05/31/2024    Patient will be independent in home exercise program to improve strength/mobility for better functional independence with ADLs. Baseline: Goal status: INITIAL   LONG TERM GOALS: Target date: 07/12/2024    Patient will increase ABC scale score >80% to demonstrate better functional mobility and better confidence with ADLs.  Baseline: 52.5% Goal status: INITIAL  2.  Patient (> 69 years old) will complete five times sit to stand test in < 15 seconds indicating an increased LE strength and improved balance. Baseline: 23 sec with use  of BUE Goal status: INITIAL  3.  Patient will increase Berg Balance score by > 6 points to demonstrate decreased fall risk during functional activities Baseline: 47/56 Goal status: INITIAL  4.  Patient will increase 10 meter walk test  to >1.3 m/s as to improve gait speed for better community ambulation. Baseline: 1.18 m/s Goal status: INITIAL     ASSESSMENT:  CLINICAL IMPRESSION:  Patient arrived with good motivation for completion of pt activities. Pt introduced to initial HEP to improve strength and functional movements. Pt practiced with stair training and showed marked improvement with practice. Pt will continue to benefit from skilled physical therapy intervention to address impairments, improve QOL, and attain therapy goals.    OBJECTIVE IMPAIRMENTS: Abnormal gait, decreased balance, decreased mobility, decreased strength, impaired sensation, improper body mechanics, postural dysfunction, and pain.   ACTIVITY LIMITATIONS: bending, bed mobility, and locomotion level  PARTICIPATION LIMITATIONS: meal prep, cleaning, shopping, community activity, and yard work  PERSONAL FACTORS: Sex and 3+ comorbidities: Per chart PMH includes HTN, anxiety, dyslipidemia, GERD, peripheral neuropathy, hypothyroidism are also affecting patient's functional outcome.   REHAB POTENTIAL: Good  CLINICAL DECISION MAKING: Evolving/moderate complexity  EVALUATION COMPLEXITY: Moderate  PLAN:  PT FREQUENCY: 1-2x/week  PT DURATION: 12 weeks  PLANNED INTERVENTIONS: 97164- PT Re-evaluation, 97750- Physical Performance Testing, 97110-Therapeutic exercises, 97530- Therapeutic activity, 97112- Neuromuscular re-education, 97535- Self Care, [id number]- Manual therapy, [phone]- Gait training, [phone]- Orthotic Initial, [phone]- Canalith repositioning, Patient/Family education, Balance training, Stair training, Joint mobilization, Spinal mobilization, Vestibular training, DME instructions, Cryotherapy, and Moist  heat  PLAN FOR NEXT SESSION: balance, strength, initiate HEP    Lonni KATHEE Gainer, PT 06/05/2024, 7:49 AM

## 2024-06-05 NOTE — Telephone Encounter (Signed)
 PT reached out to pt via secure line due to missed visit/no-show for her PT appointment with Lonni Gainer PT, DPT this morning. PT left VM with information that pt still has another appointment scheduled this morning for 1145 am. PT provided clinic phone number if patient needs to call and cancel or reschedule.  Darryle Patten PT, DPT

## 2024-06-07 ENCOUNTER — Ambulatory Visit

## 2024-06-07 ENCOUNTER — Ambulatory Visit: Admitting: Occupational Therapy

## 2024-06-11 ENCOUNTER — Ambulatory Visit: Admitting: Physical Therapy

## 2024-06-11 ENCOUNTER — Ambulatory Visit

## 2024-06-14 ENCOUNTER — Encounter: Payer: Self-pay | Admitting: Neurology

## 2024-06-14 ENCOUNTER — Ambulatory Visit: Admitting: Neurology

## 2024-06-14 VITALS — BP 130/76 | Ht 65.0 in | Wt 171.0 lb

## 2024-06-14 DIAGNOSIS — I639 Cerebral infarction, unspecified: Secondary | ICD-10-CM | POA: Diagnosis not present

## 2024-06-14 NOTE — Progress Notes (Signed)
 Chief Complaint  Patient presents with   New Patient (Initial Visit)    Rm 15, hosp f/u stroke      ASSESSMENT AND PLAN  Jamie Williams is a 63 y.o. female   Acute small vessel stroke involving left thalamus  Vascular risk factor of hypertension, hyperlipidemia, smoking,  Continue aspirin  81 mg  Emphasized importance of vascular risk factor control, increase water intake, moderate exercise, smoke cessation  Continue follow-up with primary care  DIAGNOSTIC DATA (LABS, IMAGING, TESTING) - I reviewed patient records, labs, notes, testing and imaging myself where available.   MEDICAL HISTORY:  Jamie Williams is a 63 year old female, seen in request by [facility] Kristina Maxwell for stroke, initial evaluation June 14, 2024  History is obtained from the patient and review of electronic medical records. I personally reviewed pertinent available imaging films in PACS.   PMHx of  Smoke  Depression, anxiety HTN HLD Hypothyroidism Kidney stone Hx of cervical decompression surgery in March 2017.  She was not on antiplatelet agent treatment prior to hospital admission on March 27, 2024, presenting with worsening low back pain, gait abnormality, also had some right hand numbness  MRI of the brain showed acute stroke involving left thalamocapsular  CT angiogram head and neck showed no large vessel disease  Echocardiogram showed no significant abnormality   Lab, A1C 6.0, LDL 152,   Also had MRI lumbar multilevel degenerative changes, most pronounced at L5-S1, with lateral recess narrowing, possible impingement upon transversing S1 nerve roots left greater than right   PHYSICAL EXAM:   Vitals:   06/14/24 1257  BP: 130/76  Weight: 171 lb (77.6 kg)  Height: 5' 5 (1.651 m)   Body mass index is 28.46 kg/m.  PHYSICAL EXAMNIATION:  Gen: NAD, conversant, well nourised, well groomed                     Cardiovascular: Regular rate rhythm, no peripheral  edema, warm, nontender. Eyes: Conjunctivae clear without exudates or hemorrhage Neck: Supple, no carotid bruits. Pulmonary: Clear to auscultation bilaterally   NEUROLOGICAL EXAM:  MENTAL STATUS: Speech/cognition: Awake, alert, oriented to history taking and casual conversation CRANIAL NERVES: CN II: Visual fields are full to confrontation. Pupils are round equal and briskly reactive to light. CN III, IV, VI: extraocular movement are normal. No ptosis. CN V: Facial sensation is intact to light touch CN VII: Face is symmetric with normal eye closure  CN VIII: Hearing is normal to causal conversation. CN IX, X: Phonation is normal. CN XI: Head turning and shoulder shrug are intact  MOTOR: There is no pronator drift of out-stretched arms. Muscle bulk and tone are normal. Muscle strength is normal.  REFLEXES: Reflexes are 2+ and symmetric at the biceps, triceps, knees, and ankles. Plantar responses are flexor.  SENSORY: Intact to light touch, pinprick and vibratory sensation are intact in fingers and toes.  COORDINATION: There is no trunk or limb dysmetria noted.  GAIT/STANCE: Posture is normal. Gait is steady   REVIEW OF SYSTEMS:  Full 14 system review of systems performed and notable only for as above All other review of systems were negative.   ALLERGIES: Allergies  Allergen Reactions   Celebrex [Celecoxib] Other (See Comments)    Dizziness, light-headedness   Aspirin  Nausea And Vomiting   Penicillins Rash   Sulfa Antibiotics Hives    HOME MEDICATIONS: Current Outpatient Medications  Medication Sig Dispense Refill   aspirin  EC 81 MG tablet Take 1  tablet (81 mg total) by mouth daily. Swallow whole. 30 tablet 11   atorvastatin  (LIPITOR) 80 MG tablet Take 1 tablet (80 mg total) by mouth at bedtime. 30 tablet 11   benzonatate  (TESSALON ) 200 MG capsule Take 1 capsule (200 mg total) by mouth 3 (three) times daily as needed for cough. 30 capsule 2   cholecalciferol   (VITAMIN D ) 1000 units tablet Take 2,000 Units by mouth daily.      colchicine  0.6 MG tablet Take 2 tablets (1.2 mg) on first day of gout flare, then 1 tablet daily 20 tablet 0   escitalopram  (LEXAPRO ) 10 MG tablet Take 1 tablet (10 mg total) by mouth daily. 30 tablet 5   hydrALAZINE  (APRESOLINE ) 50 MG tablet Take 1 tablet (50 mg total) by mouth 3 (three) times daily as needed (If systolic BP greater than 150 mmHg.). If systolic BP greater than 150 mmHg. 30 tablet 1   levothyroxine  (SYNTHROID ) 50 MCG tablet Take 1 tablet (50 mcg total) by mouth daily. 90 tablet 3   lisinopril  (ZESTRIL ) 40 MG tablet Take 1 tablet (40 mg total) by mouth daily. 30 tablet 2   nicotine  (NICODERM CQ  - DOSED IN MG/24 HR) 7 mg/24hr patch PLACE 1 PATCH ONTO THE SKIN DAILY 28 patch 1   ondansetron  (ZOFRAN -ODT) 4 MG disintegrating tablet Take 1 tablet (4 mg total) by mouth every 8 (eight) hours as needed for nausea or vomiting. 20 tablet 0   pantoprazole  (PROTONIX ) 40 MG tablet Take 1 tablet (40 mg total) by mouth daily. 30 tablet 11   promethazine  (PHENERGAN ) 25 MG tablet TAKE 1 TABLET(25 MG) BY MOUTH EVERY 6 HOURS AS NEEDED FOR NAUSEA OR VOMITING 30 tablet 3   sucralfate  (CARAFATE ) 1 GM/10ML suspension Take 10 mLs (1 g total) by mouth every 8 (eight) hours as needed. 420 mL 1   cyclobenzaprine  (FLEXERIL ) 10 MG tablet Take 1 tablet (10 mg total) by mouth 3 (three) times daily as needed for muscle spasms. Take one tab po qhs for back spasm prn only (Patient not taking: Reported on 06/14/2024) 30 tablet 1   erythromycin  ophthalmic ointment Place 1 Application into the left eye at bedtime. (Patient not taking: Reported on 06/14/2024) 3.5 g 0   traMADol  (ULTRAM ) 50 MG tablet Take 1/2 -1 tablet by mouth daily as needed for pain (Patient not taking: Reported on 06/14/2024) 15 tablet 0   No current facility-administered medications for this visit.    PAST MEDICAL HISTORY: Past Medical History:  Diagnosis Date   Anxiety    Family  history of adverse reaction to anesthesia    sister had a difficult time waking up from mastectomy and passed away    GERD (gastroesophageal reflux disease)    History of chicken pox    History of kidney stones    Hyperlipidemia    Hypertension    PONV (postoperative nausea and vomiting)    nausea   Sciatic nerve pain     PAST SURGICAL HISTORY: Past Surgical History:  Procedure Laterality Date   ANTERIOR CERVICAL DECOMP/DISCECTOMY FUSION N/A 12/24/2015   Procedure: Cervical five-six, Cervical six-seven  Anterior cervical decompression/diskectomy/fusion/interbody prosthesis/plate;  Surgeon: Reyes Budge, MD;  Location: [facility];  Service: Neurosurgery;  Laterality: N/A;   COLONOSCOPY WITH PROPOFOL  N/A 07/17/2018   Procedure: COLONOSCOPY WITH PROPOFOL ;  Surgeon: Therisa Bi, MD;  Location: [facility] ENDOSCOPY;  Service: Gastroenterology;  Laterality: N/A;   ECTOPIC PREGNANCY SURGERY  1991 and 1996   two Etopic preg.   Head Injuries  2006   surgery 2006 Cram/NS in [place]. Headaches with increased intracranial pressure. Repeat MRI in 2008 Negative   SUPRACERVICAL ABDOMINAL HYSTERECTOMY  2011   Abdominal; Ovaries intact; CERVIX INTACT. Fibroids/dys menorrhea. Weaver-Lee   Ulnar Neuropathy Right 2004   Ulnar Neuropathy surgical revision. 2004-2008. [place].    FAMILY HISTORY: Family History  Problem Relation Age of Onset   Hypertension Mother    Diabetes Mother        Type 2   Breast cancer Mother 56   Cancer Sister 51       Breast   Diabetes Sister        type 2   Breast cancer Sister 3   Breast cancer Sister    Diabetes Sister    Diabetes Sister    Diabetes Sister        type 2   Liver cancer Brother    Stomach cancer Brother     SOCIAL HISTORY: Social History   Socioeconomic History   Marital status: Married    Spouse name: Not on file   Number of children: 1   Years of education: Not on file   Highest education level: 12th grade  Occupational History    Occupation: disabled  Tobacco Use   Smoking status: Every Day    Current packs/day: 0.50    Types: Cigarettes   Smokeless tobacco: Never   Tobacco comments:    5 cigarettes daily.  Vaping Use   Vaping status: Never Used  Substance and Sexual Activity   Alcohol use: Not Currently    Alcohol/week: 0.0 standard drinks of alcohol   Drug use: Not Currently   Sexual activity: Yes    Birth control/protection: None  Other Topics Concern   Not on file  Social History Narrative   Right handed   Caffeine-1 cup daily   Social Drivers of Health   Financial Resource Strain: Low Risk  (08/24/2023)   Overall Financial Resource Strain (CARDIA)    Difficulty of Paying Living Expenses: Not hard at all  Food Insecurity: No Food Insecurity (05/14/2024)   Hunger Vital Sign    Worried About Running Out of Food in the Last Year: Never true    Ran Out of Food in the Last Year: Never true  Transportation Needs: No Transportation Needs (05/14/2024)   PRAPARE - Administrator, Civil Service (Medical): No    Lack of Transportation (Non-Medical): No  Physical Activity: Insufficiently Active (08/24/2023)   Exercise Vital Sign    Days of Exercise per Week: 1 day    Minutes of Exercise per Session: 10 min  Stress: Stress Concern Present (08/24/2023)   [employer] of Occupational Health - Occupational Stress Questionnaire    Feeling of Stress : To some extent  Social Connections: Moderately Isolated (08/24/2023)   Social Connection and Isolation Panel    Frequency of Communication with Friends and Family: Once a week    Frequency of Social Gatherings with Friends and Family: Once a week    Attends Religious Services: 1 to 4 times per year    Active Member of [employer] or Organizations: No    Attends Banker Meetings: Never    Marital Status: Married  Catering manager Violence: Not At Risk (05/14/2024)   Humiliation, Afraid, Rape, and Kick questionnaire    Fear of Current  or Ex-Partner: No    Emotionally Abused: No    Physically Abused: No    Sexually Abused: No  Lugene Hitt, M.D. Ph.D.  [facility] [address] [place], [place] [address] Ph: [phone] Fax: [phone]  CC:  Von Bellis, MD [address] [place],  [place] [address]  Kristina Tinnie POUR, PA-C

## 2024-06-15 ENCOUNTER — Ambulatory Visit (INDEPENDENT_AMBULATORY_CARE_PROVIDER_SITE_OTHER): Admitting: Physician Assistant

## 2024-06-15 ENCOUNTER — Encounter: Payer: Self-pay | Admitting: Physician Assistant

## 2024-06-15 VITALS — BP 132/82 | HR 66 | Temp 97.6°F | Resp 16 | Ht 65.0 in | Wt 168.8 lb

## 2024-06-15 DIAGNOSIS — E039 Hypothyroidism, unspecified: Secondary | ICD-10-CM | POA: Diagnosis not present

## 2024-06-15 DIAGNOSIS — Z8673 Personal history of transient ischemic attack (TIA), and cerebral infarction without residual deficits: Secondary | ICD-10-CM

## 2024-06-15 DIAGNOSIS — I10 Essential (primary) hypertension: Secondary | ICD-10-CM

## 2024-06-15 DIAGNOSIS — F064 Anxiety disorder due to known physiological condition: Secondary | ICD-10-CM

## 2024-06-15 MED ORDER — ESCITALOPRAM OXALATE 20 MG PO TABS: 20.0000 mg | ORAL_TABLET | Freq: Every day | ORAL | 1 refills | Status: AC

## 2024-06-15 NOTE — Progress Notes (Signed)
 [facility] [address] [place], [place] [address]  Internal MEDICINE  Office Visit Note  Patient Name: Jamie Williams  [id number]  [id number]  Date of Service: 06/15/2024  Chief Complaint  Patient presents with   Follow-up   Hypertension    HPI Pt is here for routine follow up -doing PT/OT now to work on gaining full strength -neurology appt went well, and is continuing ASA and will follow up only prn -mammogram scheduled -BP doing well, taking hydralazine  once per day and lisinopril  daily -does have some increased joint pains since stroke, not seeing ortho right now.  -recently lost her dog as well which is causing her grief especially after recent medical changes with stroke and idiopathic pancreatitis -Will increase lexapro  to help  Current Medication: Outpatient Encounter Medications as of 06/15/2024  Medication Sig   aspirin  EC 81 MG tablet Take 1 tablet (81 mg total) by mouth daily. Swallow whole.   atorvastatin  (LIPITOR) 80 MG tablet Take 1 tablet (80 mg total) by mouth at bedtime.   benzonatate  (TESSALON ) 200 MG capsule Take 1 capsule (200 mg total) by mouth 3 (three) times daily as needed for cough.   cholecalciferol  (VITAMIN D ) 1000 units tablet Take 2,000 Units by mouth daily.    colchicine  0.6 MG tablet Take 2 tablets (1.2 mg) on first day of gout flare, then 1 tablet daily   escitalopram  (LEXAPRO ) 10 MG tablet Take 1 tablet (10 mg total) by mouth daily.   hydrALAZINE  (APRESOLINE ) 50 MG tablet Take 1 tablet (50 mg total) by mouth 3 (three) times daily as needed (If systolic BP greater than 150 mmHg.). If systolic BP greater than 150 mmHg.   levothyroxine  (SYNTHROID ) 50 MCG tablet Take 1 tablet (50 mcg total) by mouth daily.   lisinopril  (ZESTRIL ) 40 MG tablet Take 1 tablet (40 mg total) by mouth daily.   nicotine  (NICODERM CQ  - DOSED IN MG/24 HR) 7 mg/24hr patch PLACE 1 PATCH ONTO THE SKIN DAILY   ondansetron  (ZOFRAN -ODT) 4 MG disintegrating tablet  Take 1 tablet (4 mg total) by mouth every 8 (eight) hours as needed for nausea or vomiting.   pantoprazole  (PROTONIX ) 40 MG tablet Take 1 tablet (40 mg total) by mouth daily.   promethazine  (PHENERGAN ) 25 MG tablet TAKE 1 TABLET(25 MG) BY MOUTH EVERY 6 HOURS AS NEEDED FOR NAUSEA OR VOMITING   sucralfate  (CARAFATE ) 1 GM/10ML suspension Take 10 mLs (1 g total) by mouth every 8 (eight) hours as needed.   [DISCONTINUED] cyclobenzaprine  (FLEXERIL ) 10 MG tablet Take 1 tablet (10 mg total) by mouth 3 (three) times daily as needed for muscle spasms. Take one tab po qhs for back spasm prn only (Patient not taking: Reported on 06/14/2024)   [DISCONTINUED] erythromycin  ophthalmic ointment Place 1 Application into the left eye at bedtime. (Patient not taking: Reported on 06/14/2024)   [DISCONTINUED] traMADol  (ULTRAM ) 50 MG tablet Take 1/2 -1 tablet by mouth daily as needed for pain (Patient not taking: Reported on 06/14/2024)   No facility-administered encounter medications on file as of 06/15/2024.    Surgical History: Past Surgical History:  Procedure Laterality Date   ANTERIOR CERVICAL DECOMP/DISCECTOMY FUSION N/A 12/24/2015   Procedure: Cervical five-six, Cervical six-seven  Anterior cervical decompression/diskectomy/fusion/interbody prosthesis/plate;  Surgeon: Reyes Budge, MD;  Location: [facility]UR[facility];  Service: Neurosurgery;  Laterality: N/A;   COLONOSCOPY WITH PROPOFOL  N/A 07/17/2018   Procedure: COLONOSCOPY WITH PROPOFOL ;  Surgeon: Therisa Bi, MD;  Location[facility] ENDOSCOPY;  Service: Gastroenterology;  Laterality:  N/A;   ECTOPIC PREGNANCY SURGERY  1991 and 1996   two Etopic preg.   Head Injuries  2006   surgery 2006 Cram/NS in [place]. Headaches with increased intracranial pressure. Repeat MRI in 2008 Negative   SUPRACERVICAL ABDOMINAL HYSTERECTOMY  2011   Abdominal; Ovaries intact; CERVIX INTACT. Fibroids/dys menorrhea. [place]   Ulnar Neuropathy Right 2004   Ulnar Neuropathy surgical  revision. 2004-2008. [place].    Medical History: Past Medical History:  Diagnosis Date   Anxiety    Family history of adverse reaction to anesthesia    sister had a difficult time waking up from mastectomy and passed away    GERD (gastroesophageal reflux disease)    History of chicken pox    History of kidney stones    Hyperlipidemia    Hypertension    PONV (postoperative nausea and vomiting)    nausea   Sciatic nerve pain     Family History: Family History  Problem Relation Age of Onset   Hypertension Mother    Diabetes Mother        Type 2   Breast cancer Mother 36   Cancer Sister 57       Breast   Diabetes Sister        type 2   Breast cancer Sister 51   Breast cancer Sister    Diabetes Sister    Diabetes Sister    Diabetes Sister        type 2   Liver cancer Brother    Stomach cancer Brother     Social History   Socioeconomic History   Marital status: Married    Spouse name: Not on file   Number of children: 1   Years of education: Not on file   Highest education level: 12th grade  Occupational History   Occupation: disabled  Tobacco Use   Smoking status: Every Day    Current packs/day: 0.50    Types: Cigarettes   Smokeless tobacco: Never   Tobacco comments:    5 cigarettes daily.  Vaping Use   Vaping status: Never Used  Substance and Sexual Activity   Alcohol use: Not Currently    Alcohol/week: 0.0 standard drinks of alcohol   Drug use: Not Currently   Sexual activity: Yes    Birth control/protection: None  Other Topics Concern   Not on file  Social History Narrative   Right handed   Caffeine-1 cup daily   Social Drivers of Health   Financial Resource Strain: Low Risk  (08/24/2023)   Overall Financial Resource Strain (CARDIA)    Difficulty of Paying Living Expenses: Not hard at all  Food Insecurity: No Food Insecurity (05/14/2024)   Hunger Vital Sign    Worried About Running Out of Food in the Last Year: Never true    Ran Out  of Food in the Last Year: Never true  Transportation Needs: No Transportation Needs (05/14/2024)   PRAPARE - Administrator, Civil Service (Medical): No    Lack of Transportation (Non-Medical): No  Physical Activity: Insufficiently Active (08/24/2023)   Exercise Vital Sign    Days of Exercise per Week: 1 day    Minutes of Exercise per Session: 10 min  Stress: Stress Concern Present (08/24/2023)   [employer] of Occupational Health - Occupational Stress Questionnaire    Feeling of Stress : To some extent  Social Connections: Moderately Isolated (08/24/2023)   Social Connection and Isolation Panel    Frequency of Communication with  Friends and Family: Once a week    Frequency of Social Gatherings with Friends and Family: Once a week    Attends Religious Services: 1 to 4 times per year    Active Member of [employer] or Organizations: No    Attends Banker Meetings: Never    Marital Status: Married  Catering manager Violence: Not At Risk (05/14/2024)   Humiliation, Afraid, Rape, and Kick questionnaire    Fear of Current or Ex-Partner: No    Emotionally Abused: No    Physically Abused: No    Sexually Abused: No      Review of Systems  Constitutional:  Negative for chills, fatigue and unexpected weight change.  HENT:  Negative for congestion, rhinorrhea, sneezing and sore throat.   Eyes:  Negative for redness.  Respiratory:  Negative for cough, chest tightness and shortness of breath.   Cardiovascular:  Negative for chest pain and palpitations.  Gastrointestinal:  Negative for abdominal pain and vomiting.  Genitourinary:  Negative for dysuria and frequency.  Musculoskeletal:  Positive for arthralgias and back pain. Negative for gait problem, joint swelling and neck pain.  Skin:  Negative for rash.  Neurological:  Positive for weakness. Negative for tremors.  Hematological:  Negative for adenopathy. Does not bruise/bleed easily.  Psychiatric/Behavioral:   Positive for dysphoric mood. Negative for suicidal ideas. Behavioral problem: Depression.The patient is nervous/anxious.     Vital Signs: BP 132/82   Pulse 66   Temp 97.6 F (36.4 C)   Resp 16   Ht 5' 5 (1.651 m)   Wt 168 lb 12.8 oz (76.6 kg)   SpO2 97%   BMI 28.09 kg/m    Physical Exam Vitals and nursing note reviewed.  Constitutional:      Appearance: Normal appearance.  HENT:     Head: Normocephalic and atraumatic.  Eyes:     Extraocular Movements: Extraocular movements intact.  Cardiovascular:     Rate and Rhythm: Normal rate and regular rhythm.  Pulmonary:     Effort: Pulmonary effort is normal.     Breath sounds: Normal breath sounds. No wheezing.  Musculoskeletal:     Right lower leg: No edema.     Left lower leg: No edema.  Skin:    General: Skin is warm and dry.  Neurological:     General: No focal deficit present.     Mental Status: She is alert.  Psychiatric:        Thought Content: Thought content normal.        Judgment: Judgment normal.     Comments: Tearful in office        Assessment/Plan: 1. Primary hypertension (Primary) Stable, continue current medications  2. Anxiety disorder due to medical condition Will increase to 20mg  lexapro  to help with anxiety and grief over loss of her dog and recent health changes - escitalopram  (LEXAPRO ) 20 MG tablet; Take 1 tablet (20 mg total) by mouth daily.  Dispense: 90 tablet; Refill: 1  3. Hypothyroidism, unspecified type Will recheck labs - TSH + free T4  4. History of stroke Continuing PT/OT to help regain strength. Continuing ASA per neurology   General Counseling: barbie croston understanding of the findings of todays visit and agrees with plan of treatment. I have discussed any further diagnostic evaluation that may be needed or ordered today. We also reviewed her medications today. she has been encouraged to call the office with any questions or concerns that should arise related to todays  visit.  No orders of the defined types were placed in this encounter.   No orders of the defined types were placed in this encounter.   This patient was seen by Tinnie Pro, PA-C in collaboration with Dr. Sigrid Bathe as a part of collaborative care agreement.   Total time spent:30 Minutes Time spent includes review of chart, medications, test results, and follow up plan with the patient.      Dr Fozia M Khan Internal medicine

## 2024-06-18 ENCOUNTER — Encounter: Admitting: Occupational Therapy

## 2024-06-18 ENCOUNTER — Ambulatory Visit: Admitting: Physical Therapy

## 2024-06-27 ENCOUNTER — Ambulatory Visit: Attending: Nurse Practitioner | Admitting: Occupational Therapy

## 2024-06-27 ENCOUNTER — Ambulatory Visit

## 2024-06-27 ENCOUNTER — Ambulatory Visit
Admission: RE | Admit: 2024-06-27 | Discharge: 2024-06-27 | Disposition: A | Discharge status: Home / Self Care | Source: Ambulatory Visit | Attending: Nurse Practitioner | Admitting: Nurse Practitioner

## 2024-06-27 DIAGNOSIS — Z1231 Encounter for screening mammogram for malignant neoplasm of breast: Secondary | ICD-10-CM | POA: Insufficient documentation

## 2024-06-27 DIAGNOSIS — R278 Other lack of coordination: Secondary | ICD-10-CM | POA: Insufficient documentation

## 2024-06-27 DIAGNOSIS — R2681 Unsteadiness on feet: Secondary | ICD-10-CM | POA: Insufficient documentation

## 2024-06-27 DIAGNOSIS — M6281 Muscle weakness (generalized): Secondary | ICD-10-CM

## 2024-06-27 NOTE — Therapy (Signed)
 OUTPATIENT PHYSICAL THERAPY NEURO EVALUATION   Patient Name: Jamie Williams MRN: [id number] DOB:06/25/1961, 63 y.o., female Today's Date: 06/28/2024   PCP: Tinnie Pro, PA-C REFERRING PROVIDER: Tinnie Pro, PA-C  END OF SESSION:  PT End of Session - 06/28/24 2003     Visit Number 1    Number of Visits 24    Date for PT Re-Evaluation 09/19/24    Progress Note Due on Visit 10    PT Start Time 1400    PT Stop Time 1444    PT Time Calculation (min) 44 min    Equipment Utilized During Treatment Gait belt    Activity Tolerance Patient tolerated treatment well    Behavior During Therapy WFL for tasks assessed/performed          Past Medical History:  Diagnosis Date   Anxiety    Family history of adverse reaction to anesthesia    sister had a difficult time waking up from mastectomy and passed away    GERD (gastroesophageal reflux disease)    History of chicken pox    History of kidney stones    Hyperlipidemia    Hypertension    PONV (postoperative nausea and vomiting)    nausea   Sciatic nerve pain    Past Surgical History:  Procedure Laterality Date   ANTERIOR CERVICAL DECOMP/DISCECTOMY FUSION N/A 12/24/2015   Procedure: Cervical five-six, Cervical six-seven  Anterior cervical decompression/diskectomy/fusion/interbody prosthesis/plate;  Surgeon: Reyes Budge, MD;  Location: [facility] NEURO ORS;  Service: Neurosurgery;  Laterality: N/A;   COLONOSCOPY WITH PROPOFOL  N/A 07/17/2018   Procedure: COLONOSCOPY WITH PROPOFOL ;  Surgeon: Therisa Bi, MD;  Location: [facility] ENDOSCOPY;  Service: Gastroenterology;  Laterality: N/A;   ECTOPIC PREGNANCY SURGERY  1991 and 1996   two Etopic preg.   Head Injuries  2006   surgery 2006 Cram/NS in [place]. Headaches with increased intracranial pressure. Repeat MRI in 2008 Negative   SUPRACERVICAL ABDOMINAL HYSTERECTOMY  2011   Abdominal; Ovaries intact; CERVIX INTACT. Fibroids/dys menorrhea. [place]   Ulnar Neuropathy Right 2004    Ulnar Neuropathy surgical revision. 2004-2008. [place].   Patient Active Problem List   Diagnosis Date Noted   Acute pancreatitis 05/14/2024   GERD without esophagitis 05/14/2024   Anxiety and depression 05/14/2024   History of stroke 05/14/2024   Cerebrovascular accident (CVA) (HCC) 03/27/2024   Essential hypertension 03/27/2024   Hypothyroidism 03/27/2024   Dyslipidemia 03/27/2024   Peripheral neuropathy 03/27/2024   Ambulatory dysfunction 03/27/2024   Hypothyroid 07/22/2020   Chronic, continuous use of opioids 04/15/2020   Pap smear abnormality of cervix/human papillomavirus (HPV) positive 08/06/2019   Aortic atherosclerosis (HCC) 10/23/2018   GERD (gastroesophageal reflux disease)    Hot flashes due to menopause 01/10/2017   Cervical spondylosis with radiculopathy 12/24/2015   Compression injury of cervical spinal nerve 10/07/2015   Left arm pain 08/29/2015   Neck pain 08/29/2015   Hematuria 07/03/2015   Allergic arthritis 06/27/2015   Arthritis 06/27/2015   Back ache 06/27/2015   Clinical depression 06/27/2015   H/O abnormal cervical Papanicolaou smear 06/27/2015   Elevated intracranial pressure 06/27/2015   Kidney stones 06/27/2015   Knee pain 06/27/2015   Abnormal mammogram 06/27/2015   Headache, migraine 06/27/2015   Neuropathy 06/27/2015   Hand paresthesia 06/27/2015   Pre-diabetes 06/27/2015   Vitamin D  deficiency 06/27/2015   Weight loss 06/27/2015   Adjustment reaction 06/27/2015   Hypercholesterolemia without hypertriglyceridemia 01/29/2010   Tobacco abuse 01/28/2010   Primary hypertension 09/16/2009   Insomnia 09/16/2009  Lesion of ulnar nerve 09/16/2009    ONSET DATE: 03/26/2024  REFERRING DIAG:  M70.101 (ICD-10-CM) - Other symptoms and signs involving the musculoskeletal system  I69.30 (ICD-10-CM) - Unspecified sequelae of cerebral infarction  Z86.73 (ICD-10-CM) - Personal history of transient ischemic attack (TIA), and cerebral infarction  without residual deficits    THERAPY DIAG:  Muscle weakness (generalized) - Plan: PT plan of care cert/re-cert  Other lack of coordination - Plan: PT plan of care cert/re-cert  Unsteadiness on feet - Plan: PT plan of care cert/re-cert  Rationale for Evaluation and Treatment: Rehabilitation  SUBJECTIVE:                                                                                                                                                                                             SUBJECTIVE STATEMENT: I feel some better. I just had a hard time in July. Right after I started PT I went to ED with stomach issues then back 5 days later on 05/14/2024- with pancreatitis- spent several days in hospital. Had a f/u with primary and received new order back for PT. I feel like I just don't have any energy On disability- 12-13 years  Pt accompanied by: self  PERTINENT HISTORY: PMH includes: HTN, Anxiety, Dyslipidemia, GERD, Peripheral Neuropathy, Hypothyroidism. Patient had initiated PT services on 04/19/24 was seen twice before going back to hospital and admitted with pancreatitis. She was originally being seen due to have a CVA on 03/26/2024  PAIN:  Are you having pain? Yes: NPRS scale: current 6/10 Pain location: Right shoulder  Pain description: achy Aggravating factors: any use of Right UE Relieving factors: rest, tylenol - helps some  PRECAUTIONS: Fall  RED FLAGS: None   WEIGHT BEARING RESTRICTIONS: No  FALLS: Has patient fallen in last 6 months? Yes. Number of falls 1 fall 3 months ago- sitting on sofa- was getting up and fell forward.   LIVING ENVIRONMENT: Lives with: lives with their family- Husband and son Lives in: House/apartment Stairs: Yes: External: 3 steps; can reach both Has following equipment at home: Single point cane, Walker - 4 wheeled, and shower chair  PLOF: Independent  PATIENT GOALS: I just want to get better- use my hand and improve my balance.    OBJECTIVE:  Note: Objective measures were completed at Evaluation unless otherwise noted.  DIAGNOSTIC FINDINGS: CLINICAL DATA:  63 year old female with headache and pain. Status post left lateral thalamic/internal capsule lacunar infarcts last month. Epigastric pain radiating under the left breast with nausea.   EXAM: CT HEAD WITHOUT CONTRAST   TECHNIQUE: Contiguous axial images were obtained from the base of the skull  through the vertex without intravenous contrast.   RADIATION DOSE REDUCTION: This exam was performed according to the departmental dose-optimization program which includes automated exposure control, adjustment of the mA and/or kV according to patient size and/or use of iterative reconstruction technique.   COMPARISON:  Brain MRI 03/26/2024. head CT 03/27/2024.   FINDINGS: Brain: Expected small foci of cystic encephalomalacia corresponding to the left deep gray and white matter lacunar infarcts on MRI last month. Elsewhere gray-white differentiation is stable.   Background cerebral volume remains normal for age. No midline shift, ventriculomegaly, mass effect, evidence of mass lesion, intracranial hemorrhage or evidence of cortically based acute infarction.   Vascular: No suspicious intracranial vascular hyperdensity. Calcified atherosclerosis at the skull base.   Skull: Previous suboccipital decompression. No acute osseous abnormality identified.   Sinuses/Orbits: Visualized paranasal sinuses and mastoids are stable and well aerated.   Other: No acute orbit or scalp soft tissue finding.   IMPRESSION: 1.  No acute intracranial abnormality. 2. Expected evolution of the Left deep gray and white matter lacunar infarcts on MRI last month. 3. Previous suboccipital decompression.     Electronically Signed   By: VEAR Hurst M.D.   On: 05/09/2024 03:58  COGNITION: Overall cognitive status: Within functional limits for tasks assessed   SENSATION: R hand  numbness and tingling   COORDINATION: Good heel to shin- bilateral  EDEMA:  None observed or reported   POSTURE: rounded shoulders, forward head, and increased thoracic kyphosis  LOWER EXTREMITY ROM:     Active  Right Eval Left Eval  Hip flexion    Hip extension    Hip abduction    Hip adduction    Hip internal rotation    Hip external rotation    Knee flexion    Knee extension    Ankle dorsiflexion    Ankle plantarflexion    Ankle inversion    Ankle eversion     (Blank rows = not tested)  LOWER EXTREMITY MMT:    MMT Right Eval Left Eval  Hip flexion 3+ 4+  Hip extension 4 4+  Hip abduction 4 4+  Hip adduction    Hip internal rotation    Hip external rotation    Knee flexion 4 5  Knee extension 4 5  Ankle dorsiflexion 4+ 5  Ankle plantarflexion    Ankle inversion    Ankle eversion    (Blank rows = not tested)  BED MOBILITY:  Not tested- Patient reports independent   TRANSFERS: Sit to stand: SBA  Assistive device utilized: None     Stand to sit: SBA  Assistive device utilized: None     Chair to chair: SBA  Assistive device utilized: None       RAMP:  Not tested  CURB:  Not tested  STAIRS: Not tested GAIT: Findings: Gait Characteristics: decreased arm swing- Right, decreased step length- Right, decreased step length- Left, and decreased stride length, Distance walked: approx 100 feet, and Level of assistance: SBA  FUNCTIONAL TESTS:  5 times sit to stand: 5 times sit to stand: 18.0 sec without UE Support Timed up and go (TUG): 9.78 sec  6 minute walk test: To be assessed next visit.  10 Meter walk test: 1.17  Berg Balance Scale:  Item Test date: 06/27/2024 Test date:  Test date:   Sitting to standing 4. able to stand without using hands and stabilize independently Insert OPRCBERGREEVAL SmartPhrase at re-test date Insert OPRCBERGREEVAL SmartPhrase at re-test date  2. Standing unsupported 4.  able to stand safely for 2 minutes    3. Sitting with  back unsupported, feet supported 4. able to sit safely and securely for 2 minutes    4. Standing to sitting 3. controls descent by using hands    5. Pivot transfer  4. able to transfer safely with minor use of hands    6. Standing unsupported with eyes closed 4. able to stand 10 seconds safely    7. Standing unsupported with feet together 3. able to place feet together independently and stand 1 minute with supervision    8. Reaching forward with outstretched arms while standing 2. can reach forward 5 cm (2 inches)    9. Pick up object from the floor from standing 3. able to pick up slipper but needs supervision    10. Turning to look behind over left and right shoulders while standing 3. looks behind one side only, other side shows less weight shift    11. Turn 360 degrees 3. able to turn 360 degrees safely, one side only, in 4 seconds or less    12. Place alternate foot on step or stool while standing unsupported 4. ble to stand independently and safely and complete 8 steps in 20 seconds    13. Standing unsupported one foot in front 3. able to place foot ahead independently and hold 30 seconds    14. Standing on one leg 2. able to lift leg independently and hold >= 3 seconds      Total Score 46/56 Total Score /56 Total Score /56     PATIENT SURVEYS:  ABC scale: The Activities-Specific Balance Confidence (ABC) Scale- To be assessed next visit                                                                                                                             TREATMENT DATE:  PT evaluation Education in CVA symptoms Education in balance activities including - Single leg stance x multiple trials- approx 3 sec at best on RLE. Tandem standing x 20 sec x several trials    PATIENT EDUCATION: Education details: Exam details, PT plan of care; Purpose of functional outcome measures, Education in HEP Person educated: Patient Education method: Explanation Education comprehension: verbalized  understanding  HOME EXERCISE PROGRAM: Added SLS and Tandem (to be performed daily- multiple trials) to try to improve items identified as impaired during balance testing.   GOALS: Goals reviewed with patient? Yes  SHORT TERM GOALS: Target date: 08/08/2024  Patient will be independent in home exercise program to improve strength/mobility for better functional independence with ADLs. Baseline: EVAL - No formal HEP in place- edcuated in Tandem and SLS today and added to HEP.  Goal status: INITIAL   LONG TERM GOALS: Target date: 09/19/2024  1.  Patient will complete five times sit to stand test in < 15 seconds indicating an increased LE strength and improved balance. Baseline: EVAL- 18.0 sec without UE Support Goal status:  INITIAL  2.  Patient will improve ABC score by 9 points    to demonstrate statistically significant improvement in mobility and quality of life as it relates to their confidence in her balance.  Baseline: EVAL- To be issued visit #2 Goal status: INITIAL   3.  Patient will increase Berg Balance score by > 6 points to demonstrate decreased fall risk during functional activities. Baseline: EVAL= 46/56 Goal status: INITIAL    4.   Patient will increase six minute walk test distance to >1000 for progression to community ambulator and improve gait ability Baseline: EVAL- To be assessed on visit #2 Goal status: INITIAL  ASSESSMENT:  CLINICAL IMPRESSION: Patient is a 63 y.o. female who was seen today for physical therapy evaluation and treatment for cerebral infarction. Patient was participating in PT prior to another hospitalization involving pancreatitis. Patient presents with exam findings of R LE muscle weakness, impaired mobility with decreased balance and increased risk of falling. Patient denies any falls but states she is fearful of falling and states having no energy. Will issued ABC and test endurance ( walk test next visit). Pt will benefit from further  skilled PT interventions to improve her mobility, strength, and balance  in order to increase ease and safety with mobility and ADLs and reduce fall risk.   OBJECTIVE IMPAIRMENTS: Abnormal gait, decreased activity tolerance, decreased balance, decreased coordination, decreased endurance, decreased mobility, difficulty walking, and decreased strength.   ACTIVITY LIMITATIONS: carrying, lifting, bending, sitting, standing, squatting, sleeping, stairs, and transfers  PARTICIPATION LIMITATIONS: meal prep, cleaning, laundry, driving, shopping, community activity, and yard work  PERSONAL FACTORS: 3+ comorbidities: knee pain, spinal pain, depression are also affecting patient's functional outcome.   REHAB POTENTIAL: Good  CLINICAL DECISION MAKING: Evolving/moderate complexity  EVALUATION COMPLEXITY: Moderate  PLAN:  PT FREQUENCY: 1-2x/week  PT DURATION: 12 weeks  PLANNED INTERVENTIONS: 97164- PT Re-evaluation, 97750- Physical Performance Testing, 97110-Therapeutic exercises, 97530- Therapeutic activity, [id number]- Neuromuscular re-education, 97535- Self Care, [id number]- Manual therapy, [id number]- Gait training, [phone]- Orthotic Initial, [phone]- Orthotic/Prosthetic subsequent, [phone]- Canalith repositioning, [id number]- Electrical stimulation (manual), [id number]- Ultrasound, [id number] (1-2 muscles), [id number] (3+ muscles)- Dry Needling, Patient/Family education, Balance training, Stair training, Taping, Joint mobilization, Joint manipulation, Spinal manipulation, Spinal mobilization, Vestibular training, DME instructions, Cryotherapy, and Moist heat  PLAN FOR NEXT SESSION:  -Issue ABC and update goal -Add to HEP for dynamic balance - issued handout and include activities performed on Eval- SLS and tandem -Balance interventions  -LE strengthening  -6 min walk test and update goal   Reyes LOISE London, PT 06/28/2024, 8:40 PM

## 2024-06-27 NOTE — Therapy (Addendum)
 OUTPATIENT OCCUPATIONAL THERAPY NEURO RE-EVALUATION  Patient Name: Jamie Williams MRN: [id number] DOB:[birth year], 63 y.o., female Today's Date: 06/27/2024  PCP: Kristina Tinnie POUR, PA-C REFERRING PROVIDER: Liana Fish, NP  END OF SESSION:  OT End of Session - 06/27/24 1317     Visit Number 2    Number of Visits 24    Date for OT Re-Evaluation 09/19/24    OT Start Time 1315    OT Stop Time 1400    OT Time Calculation (min) 45 min    Activity Tolerance Patient tolerated treatment well    Behavior During Therapy WFL for tasks assessed/performed              Past Medical History:  Diagnosis Date   Anxiety    Family history of adverse reaction to anesthesia    sister had a difficult time waking up from mastectomy and passed away    GERD (gastroesophageal reflux disease)    History of chicken pox    History of kidney stones    Hyperlipidemia    Hypertension    PONV (postoperative nausea and vomiting)    nausea   Sciatic nerve pain    Past Surgical History:  Procedure Laterality Date   ANTERIOR CERVICAL DECOMP/DISCECTOMY FUSION N/A 12/24/2015   Procedure: Cervical five-six, Cervical six-seven  Anterior cervical decompression/diskectomy/fusion/interbody prosthesis/plate;  Surgeon: Reyes Budge, MD;  Location: [facility] NEURO [facility]S;  Service: Neurosurgery;  Laterality: N/A;   COLONOSCOPY WITH PROPOFOL  N/A 07/17/2018   Procedure: COLONOSCOPY WITH PROPOFOL ;  Surgeon: Therisa Bi, MD;  Location: [facility] ENDOSCOPY;  Service: Gastroenterology;  Laterality: N/A;   ECTOPIC PREGNANCY SURGERY  1991 and 1996   two Etopic preg.   Head Injuries  2006   surgery 2006 Cram/NS in [place]. Headaches with increased intracranial pressure. Repeat MRI in 2008 Negative   SUPRACERVICAL ABDOMINAL HYSTERECTOMY  2011   Abdominal; Ovaries intact; CERVIX INTACT. Fibroids/dys menorrhea. [place]   Ulnar Neuropathy Right 2004   Ulnar Neuropathy surgical revision. 2004-2008. [place].   Patient  Active Problem List   Diagnosis Date Noted   Acute pancreatitis 05/14/2024   GERD without esophagitis 05/14/2024   Anxiety and depression 05/14/2024   History of stroke 05/14/2024   Cerebrovascular accident (CVA) (HCC) 03/27/2024   Essential hypertension 03/27/2024   Hypothyroidism 03/27/2024   Dyslipidemia 03/27/2024   Peripheral neuropathy 03/27/2024   Ambulatory dysfunction 03/27/2024   Hypothyroid 07/22/2020   Chronic, continuous use of opioids 04/15/2020   Pap smear abnormality of cervix/human papillomavirus (HPV) positive 08/06/2019   Aortic atherosclerosis (HCC) 10/23/2018   GERD (gastroesophageal reflux disease)    Hot flashes due to menopause 01/10/2017   Cervical spondylosis with radiculopathy 12/24/2015   Compression injury of cervical spinal nerve 10/07/2015   Left arm pain 08/29/2015   Neck pain 08/29/2015   Hematuria 07/03/2015   Allergic arthritis 06/27/2015   Arthritis 06/27/2015   Back ache 06/27/2015   Clinical depression 06/27/2015   H/O abnormal cervical Papanicolaou smear 06/27/2015   Elevated intracranial pressure 06/27/2015   Kidney stones 06/27/2015   Knee pain 06/27/2015   Abnormal mammogram 06/27/2015   Headache, migraine 06/27/2015   Neuropathy 06/27/2015   Hand paresthesia 06/27/2015   Pre-diabetes 06/27/2015   Vitamin D  deficiency 06/27/2015   Weight loss 06/27/2015   Adjustment reaction 06/27/2015   Hypercholesterolemia without hypertriglyceridemia 01/29/2010   Tobacco abuse 01/28/2010   Primary hypertension 09/16/2009   Insomnia 09/16/2009   Lesion of ulnar nerve 09/16/2009    ONSET DATE: 03/26/24  REFERRING  DIAG: CVA  THERAPY DIAG:  Weakness, lack of coordination  Rationale for Evaluation and Treatment: Rehabilitation  SUBJECTIVE:   SUBJECTIVE STATEMENT:  Pt. reports being so weak after having Pancreatitis. Pt accompanied by: self  PERTINENT HISTORY:   Addendum: Pt. was previously evaluated by OT services 2/2 a CVA as per  the pertinent history below. Pt., however, was hospitalized from 7/21/-05/16/24 for Pancreatitis and abdominal pain shortly after being evaluated.    Pt. is a 63 y.o. female who was admitted to the hospital with an acute onset of back pain, trouble walking RUE and LE weakness. Imaging revealed an acute ischemic vertebrobasilar artery thalamic stroke, and Degenerative Changes of the Lumbar spine  L5-S1. PHMx includes: Peripheral neuropathy, Essential HTN, Hypothyroidism, Dyslipidemia, Ambulatory Dysfunction. Pt. has a history of neck, and right shoulder pain with a planned MRI in August 2025. Pt. Has a History of remote right elbow injury from working in the [employer].   PRECAUTIONS: None  WEIGHT BEARING RESTRICTIONS: No  PAIN:  Are you having pain? Right  shoulder 7/10 pain. Has 7/10 Sciatica; sometimes reaches a 10/10 at home; arthritis in the spine  FALLS: Has patient fallen in last 6 months? 1 fall getting up from the couch.  LIVING ENVIRONMENT: Lives with: lives with their spouse and lives with their son Lives in: House/apartment Stairs:  3 steps to enter; one level home Has following equipment at home: Counselling psychologist, BSCommode, rollator  PLOF: Independent  PATIENT GOALS: To get back to her PLOF  OBJECTIVE:  Note: Objective measures were completed at Evaluation unless otherwise noted.  HAND DOMINANCE: Right  ADLs:  Transfers/ambulation related to ADLs: Independent Eating: Independent, increased time required, loses grip on a glass, difficulty cutting food Grooming: Independent UB Dressing: Independent, some difficulty with buttoning LB Dressing: independent Toileting: Independent Bathing: Independent  Tub Shower transfers: Independent; distance supervision -showers when family is home   IADLs: Shopping: Sister has been doing the grocery shopping Light housekeeping: Writer,  has not washed dishes, independent with bedmaking increased time  2/2 decreased  balance Meal Prep: Independent Community mobility: Drives a little bit Medication management: Has difficulty using the right hand to open medication bottles Financial management: No changes in the process Handwriting: 75% legibility for name only Typing/cellphone use: Difficulty using then right to manage the cellphone. 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 Hobbies: Crafts  MOBILITY STATUS: Independent without an assistive device    ACTIVITY TOLERANCE: Activity tolerance: WFL  FUNCTIONAL OUTCOME MEASURES: Eval: MAM Measure Sum Score: 69 Re-eval: MAM Measure Score: 63  UPPER EXTREMITY ROM:    Active ROM Right Eval 04/19/24 Right Re-Eval 06/27/24 Left Eval 04/19/24 Left: Re-eval 06/27/24   Shoulder flexion 92(103) 111(126) WFL WFL  Shoulder abduction 82(90) 122(127) [facility] WFL  Shoulder adduction      Shoulder extension      Shoulder internal rotation      Shoulder external rotation      Elbow flexion [facility] WFL WFL  Elbow extension [facility] WFL  Wrist flexion [facility] WFL WFL  Wrist extension [facility] WFL WFL  Wrist ulnar deviation      Wrist radial deviation      Wrist pronation      Wrist supination      (Blank rows = not tested)  UPPER EXTREMITY MMT:     MMT Right Eval 04/19/24 Right Re-eval 06/27/24 Left Eval 04/19/24 Right  Re-eval 06/27/24  Shoulder flexion 3-/5 3-/5  5/5 4+/5  Shoulder abduction 3-/5 3/5 5/5 4+/5  Shoulder adduction      Shoulder extension      Shoulder internal rotation      Shoulder external rotation      Middle trapezius      Lower trapezius      Elbow flexion 4/5 4/5  5/5 4+/5  Elbow extension 4/5 4/5 5/5 4+/5  Wrist flexion 4/5 4/5 5/5 4+/5  Wrist extension 4/5 4/5 5/5 4+/5  Wrist ulnar deviation      Wrist radial deviation      Wrist pronation      Wrist supination      (Blank rows = not tested)  HAND FUNCTION:  Re-eval: Grip strength: Right: 65 lbs; Left: 69 lbs, Lateral pinch: Right: 17 lbs,  Left: 16 lbs, and 3 point pinch: Right: 15 lbs, Left: 17 lbs  Eval: Grip strength: Right: 63 lbs; Left: 75 lbs, Lateral pinch: Right: 15 lbs, Left: 16 lbs, and 3 point pinch: Right: 14 lbs, Left: 17 lbs  COORDINATION:   Re-eval: 9 Hole Peg test: Right: 24 sec; Left: 26 sec.   Eval: 9 Hole Peg test: Right: 26 sec; Left: 22 sec  SENSATION: tingling/numb WFL Light touch: WFL Proprioception: WFL  EDEMA: WFL   COGNITION: Overall cognitive status: Within functional limits for tasks assessed; Pt. Reports that sometimes she can remember things, and sometimes  VISION: Subjective report: blurriness since the stroke   PRAXIS: Impaired fine motor control   TREATMENT DATE: 06/27/24   OT Re-evaluation was completed, and Pt. education was provided as indicated below.   PATIENT EDUCATION: Education details: OT services, POC, goals, ADL, and IADL functioning, Pink Theraputty exercises for right hand grip strengthening, lateral & 3pt. pinch strengthening. Person educated: Patient Education method: Explanation, Demonstration, and Tactile cues Education comprehension: verbalized understanding, returned demonstration, verbal cues required, tactile cues required, and needs further education  HOME EXERCISE PROGRAM:  Continue to provide as indicated  GOALS: Goals reviewed with patient? yes  SHORT TERM GOALS: Target date: 08/08/2024    Pt. Will be independent with HEPs for the RUE. Baseline: Re-eval: HEP for hand strengthening with pink theraputty for grip strength, pinch strength  Eval:  No current HEP  Goal status: INITIAL  LONG TERM GOALS: Target date: 09/19/2024  Pt. will be improve the MAM-20 score by 2 points to reflect progress with hand function during ADL/IADL tasks. Baseline: Re-eval: MAM-20 Sum score: 63 Eval: MAM-20 Sum score: 69 Goal status: INITIAL  2.  Pt. Will improve Right shoulder flexion AROM by 5 degrees to assist with performing hair care efficiently Baseline:  Right shoulder flexion: 111(126), Abduction: 877(872) Eval: Right shoulder flexion: 92(103), Abduction: 82 (90)  Goal status: 9/03 (Re-eval) Revised for hair care  3.  Pt. Will improve BUE strength by 2 muscle grades to assist with ADLs, and IADLs. Baseline: Re-eval: Right shoulder flexion: 3-/5, abduction: 3/5, elbow flexion: 4/5, extension: 4/5, wrist flexion: 4/5, extension: 4/5, Left UE strength: 4/5 overall Eval: Right shoulder flexion: 3-/5, abduction: 3-/5, elbow flexion: 4/5, extension: 4/5, wrist flexion: 4/5, extension: 4/5 Goal status: INITIAL  4.  Pt. Will increase right grip strength by 5# to be able to securely hold objects Baseline: Re-eval: Grip strength: Right: 65 lbs; Left: 69 lbs Eval: Right: 63#, L: 75# Goal status: INITIAL  5.  Pt. Will increase right pinch strength by 3# to be able to open jars, bottles, containers Baseline: Re-eval: Lateral pinch: Right: 17 lbs, Left: 16 lbs, and  3 point pinch: Right: 15 lbs, Left: 17 lbs Eval: Lateral pinch: right: 15#, left: 16#; 3pt. Pinch: right: 14#, left: 17# Goal status: INITIAL  6.  Pt. Will improve right hand [facility] skills by 3 sec. Of speed to be able to efficiently manipulate small objects/buttons. Baseline: Re-eval:  9 Hole Peg test: Right: 24 sec; Left: 26 sec. Eval:Right: 27 sec. Left: 22 sec. Goal status: INITIAL  6.  Pt. Will write 3 sentences efficiently with 75% legibility in preparation for written correspondence Baseline: Re-eval: Name only: 75% legibility; Eval: name only: 50% legibility  Goal status: INITIAL   ASSESSMENT:  CLINICAL IMPRESSION:  Patient is a 63 y.o. female who was seen today for occupational therapy re-evaluation for CVA after having been hospitalized with Pancreatitis, and abdominal pain. Pt. is now ready to resume therapy services. Pt. presents with weakness overall, decreased right shoulder ROM, limited RUE strength, decreased right grip strength, pinch strength, and impaired [facility] skills which  continues to limit her ability to efficiently complete daily ADL, and IADL tasks including: buttoning, opening bottles/jars/containers, securely holding a full glass, and reaching up to perform hair care. MAM-20 score sum score: 63. Pt. will continue to benefit from OT services to improve right hand function in order to work towards improving, and maximizing engagement in, and independence with ADLs, and IADL tasks.   PERFORMANCE DEFICITS: in functional skills including ADLs, IADLs, coordination, dexterity, proprioception, ROM, strength, Fine motor control, Gross motor control, vision, and UE functional use, pain.cognitive skills including memory, and psychosocial skills coping strategies, environmental adaptation, interpersonal interactions, and routines and behaviors.   IMPAIRMENTS: are limiting patient from ADLs, IADLs, rest and sleep, and leisure.   CO-MORBIDITIES: may have co-morbidities  that affects occupational performance. Patient will benefit from skilled OT to address above impairments and improve overall function.  MODIFICATION OR ASSISTANCE TO COMPLETE EVALUATION: Min-Moderate modification of tasks or assist with assess necessary to complete an evaluation.  OT OCCUPATIONAL PROFILE AND HISTORY: Detailed assessment: Review of records and additional review of physical, cognitive, psychosocial history related to current functional performance.  CLINICAL DECISION MAKING: Moderate - several treatment options, min-mod task modification necessary  REHAB POTENTIAL: Good  EVALUATION COMPLEXITY: Moderate    PLAN:  OT FREQUENCY: 2x/week  OT DURATION: 12 weeks  PLANNED INTERVENTIONS: 97168 OT Re-evaluation, 97535 self care/ADL training, 02889 therapeutic exercise, 97530 therapeutic activity, 97112 neuromuscular re-education, 97140 manual therapy, 97018 paraffin, 02989 moist heat, 97034 contrast bath, passive range of motion, functional mobility training, patient/family education, and DME  and/or AE instructions  RECOMMENDED OTHER SERVICES: OT  CONSULTED AND AGREED WITH PLAN OF CARE: Patient  PLAN FOR NEXT SESSION:   Treatment  Richardson Otter, MS, OTR/L   06/27/2024, 3:59 PM

## 2024-07-02 ENCOUNTER — Ambulatory Visit: Admitting: Physical Therapy

## 2024-07-02 ENCOUNTER — Ambulatory Visit: Admitting: Occupational Therapy

## 2024-07-02 DIAGNOSIS — M6281 Muscle weakness (generalized): Secondary | ICD-10-CM

## 2024-07-02 DIAGNOSIS — R2681 Unsteadiness on feet: Secondary | ICD-10-CM | POA: Diagnosis not present

## 2024-07-02 DIAGNOSIS — R278 Other lack of coordination: Secondary | ICD-10-CM | POA: Diagnosis not present

## 2024-07-02 NOTE — Therapy (Signed)
 OUTPATIENT OCCUPATIONAL THERAPY NEURO TREATMENT NOTE  Patient Name: Jamie Williams MRN: [id number] DOB:Mar 07, 1961, 63 y.o., female Today's Date: 07/02/2024  PCP: Kristina Tinnie POUR, PA-C REFERRING PROVIDER: Liana Fish, NP  END OF SESSION:  OT End of Session - 07/02/24 1440     Visit Number 3    Number of Visits 24    Date for OT Re-Evaluation 09/19/24    OT Start Time 1407    OT Stop Time 1430    OT Time Calculation (min) 23 min    Activity Tolerance Patient tolerated treatment well    Behavior During Therapy WFL for tasks assessed/performed              Past Medical History:  Diagnosis Date   Anxiety    Family history of adverse reaction to anesthesia    sister had a difficult time waking up from mastectomy and passed away    GERD (gastroesophageal reflux disease)    History of chicken pox    History of kidney stones    Hyperlipidemia    Hypertension    PONV (postoperative nausea and vomiting)    nausea   Sciatic nerve pain    Past Surgical History:  Procedure Laterality Date   ANTERIOR CERVICAL DECOMP/DISCECTOMY FUSION N/A 12/24/2015   Procedure: Cervical five-six, Cervical six-seven  Anterior cervical decompression/diskectomy/fusion/interbody prosthesis/plate;  Surgeon: Reyes Budge, MD;  Location: [facility] NEURO ORS;  Service: Neurosurgery;  Laterality: N/A;   COLONOSCOPY WITH PROPOFOL  N/A 07/17/2018   Procedure: COLONOSCOPY WITH PROPOFOL ;  Surgeon: Therisa Bi, MD;  Location: [facility] ENDOSCOPY;  Service: Gastroenterology;  Laterality: N/A;   ECTOPIC PREGNANCY SURGERY  1991 and 1996   two Etopic preg.   Head Injuries  2006   surgery 2006 Cram/NS in [place]. Headaches with increased intracranial pressure. Repeat MRI in 2008 Negative   SUPRACERVICAL ABDOMINAL HYSTERECTOMY  2011   Abdominal; Ovaries intact; CERVIX INTACT. Fibroids/dys menorrhea. [place]   Ulnar Neuropathy Right 2004   Ulnar Neuropathy surgical revision. 2004-2008. [place].    Patient Active Problem List   Diagnosis Date Noted   Acute pancreatitis 05/14/2024   GERD without esophagitis 05/14/2024   Anxiety and depression 05/14/2024   History of stroke 05/14/2024   Cerebrovascular accident (CVA) (HCC) 03/27/2024   Essential hypertension 03/27/2024   Hypothyroidism 03/27/2024   Dyslipidemia 03/27/2024   Peripheral neuropathy 03/27/2024   Ambulatory dysfunction 03/27/2024   Hypothyroid 07/22/2020   Chronic, continuous use of opioids 04/15/2020   Pap smear abnormality of cervix/human papillomavirus (HPV) positive 08/06/2019   Aortic atherosclerosis (HCC) 10/23/2018   GERD (gastroesophageal reflux disease)    Hot flashes due to menopause 01/10/2017   Cervical spondylosis with radiculopathy 12/24/2015   Compression injury of cervical spinal nerve 10/07/2015   Left arm pain 08/29/2015   Neck pain 08/29/2015   Hematuria 07/03/2015   Allergic arthritis 06/27/2015   Arthritis 06/27/2015   Back ache 06/27/2015   Clinical depression 06/27/2015   H/O abnormal cervical Papanicolaou smear 06/27/2015   Elevated intracranial pressure 06/27/2015   Kidney stones 06/27/2015   Knee pain 06/27/2015   Abnormal mammogram 06/27/2015   Headache, migraine 06/27/2015   Neuropathy 06/27/2015   Hand paresthesia 06/27/2015   Pre-diabetes 06/27/2015   Vitamin D  deficiency 06/27/2015   Weight loss 06/27/2015   Adjustment reaction 06/27/2015   Hypercholesterolemia without hypertriglyceridemia 01/29/2010   Tobacco abuse 01/28/2010   Primary hypertension 09/16/2009   Insomnia 09/16/2009   Lesion of ulnar nerve 09/16/2009    ONSET DATE: 03/26/24  REFERRING DIAG: CVA  THERAPY DIAG:  Weakness, lack of coordination  Rationale for Evaluation and Treatment: Rehabilitation  SUBJECTIVE:   SUBJECTIVE STATEMENT:  Pt. reports  feeling like she is coming down with a cold, and that her colds always turn into the flu. Pt accompanied by: self  PERTINENT HISTORY:   Addendum:  Pt. was previously evaluated by OT services 2/2 a CVA as per the pertinent history below. Pt., however, was hospitalized from 7/21/-05/16/24 for Pancreatitis and abdominal pain shortly after being evaluated.    Pt. is a 63 y.o. female who was admitted to the hospital with an acute onset of back pain, trouble walking RUE and LE weakness. Imaging revealed an acute ischemic vertebrobasilar artery thalamic stroke, and Degenerative Changes of the Lumbar spine  L5-S1. PHMx includes: Peripheral neuropathy, Essential HTN, Hypothyroidism, Dyslipidemia, Ambulatory Dysfunction. Pt. has a history of neck, and right shoulder pain with a planned MRI in August 2025. Pt. Has a History of remote right elbow injury from working in the [place].   PRECAUTIONS: None  WEIGHT BEARING RESTRICTIONS: No  PAIN:  07/02/24: 6/10 pain in the shoulder, and back Eval: Are you having pain? Right  shoulder 7/10 pain. Has 7/10 Sciatica; sometimes reaches a 10/10 at home; arthritis in the spine  FALLS: Has patient fallen in last 6 months? 1 fall getting up from the couch.  LIVING ENVIRONMENT: Lives with: lives with their spouse and lives with their son Lives in: House/apartment Stairs:  3 steps to enter; one level home Has following equipment at home: Counselling psychologist, BSCommode, rollator  PLOF: Independent  PATIENT GOALS: To get back to her PLOF  OBJECTIVE:  Note: Objective measures were completed at Evaluation unless otherwise noted.  HAND DOMINANCE: Right  ADLs:  Transfers/ambulation related to ADLs: Independent Eating: Independent, increased time required, loses grip on a glass, difficulty cutting food Grooming: Independent UB Dressing: Independent, some difficulty with buttoning LB Dressing: independent Toileting: Independent Bathing: Independent  Tub Shower transfers: Independent; distance supervision -showers when family is home   IADLs: Shopping: Sister has been doing the grocery shopping Light  housekeeping: Writer,  has not washed dishes, independent with bedmaking increased time  2/2 decreased balance Meal Prep: Independent Community mobility: Drives a little bit Medication management: Has difficulty using the right hand to open medication bottles Financial management: No changes in the process Handwriting: 75% legibility for name only Typing/cellphone use: Difficulty using then right to manage the cellphone. 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 Hobbies: Crafts  MOBILITY STATUS: Independent without an assistive device    ACTIVITY TOLERANCE: Activity tolerance: WFL  FUNCTIONAL OUTCOME MEASURES: Eval: MAM Measure Sum Score: 69 Re-eval: MAM Measure Score: 63  UPPER EXTREMITY ROM:    Active ROM Right Eval 04/19/24 Right Re-Eval 06/27/24 Left Eval 04/19/24 Left: Re-eval 06/27/24   Shoulder flexion 92(103) 111(126) WFL WFL  Shoulder abduction 82(90) 122(127) [facility] WFL  Shoulder adduction      Shoulder extension      Shoulder internal rotation      Shoulder external rotation      Elbow flexion [facility] WFL WFL  Elbow extension [facility] WFL  Wrist flexion [facility] WFL WFL  Wrist extension [facility] WFL WFL  Wrist ulnar deviation      Wrist radial deviation      Wrist pronation      Wrist supination      (Blank rows = not tested)  UPPER EXTREMITY MMT:  MMT Right Eval 04/19/24 Right Re-eval 06/27/24 Left Eval 04/19/24 Right  Re-eval 06/27/24  Shoulder flexion 3-/5 3-/5 5/5 4+/5  Shoulder abduction 3-/5 3/5 5/5 4+/5  Shoulder adduction      Shoulder extension      Shoulder internal rotation      Shoulder external rotation      Middle trapezius      Lower trapezius      Elbow flexion 4/5 4/5  5/5 4+/5  Elbow extension 4/5 4/5 5/5 4+/5  Wrist flexion 4/5 4/5 5/5 4+/5  Wrist extension 4/5 4/5 5/5 4+/5  Wrist ulnar deviation      Wrist radial deviation      Wrist pronation      Wrist supination      (Blank rows =  not tested)  HAND FUNCTION:  Re-eval: Grip strength: Right: 65 lbs; Left: 69 lbs, Lateral pinch: Right: 17 lbs, Left: 16 lbs, and 3 point pinch: Right: 15 lbs, Left: 17 lbs  Eval: Grip strength: Right: 63 lbs; Left: 75 lbs, Lateral pinch: Right: 15 lbs, Left: 16 lbs, and 3 point pinch: Right: 14 lbs, Left: 17 lbs  COORDINATION:   Re-eval: 9 Hole Peg test: Right: 24 sec; Left: 26 sec.   Eval: 9 Hole Peg test: Right: 26 sec; Left: 22 sec  SENSATION: tingling/numb WFL Light touch: WFL Proprioception: WFL  EDEMA: WFL   COGNITION: Overall cognitive status: Within functional limits for tasks assessed; Pt. Reports that sometimes she can remember things, and sometimes  VISION: Subjective report: blurriness since the stroke   PRAXIS: Impaired fine motor control   TREATMENT DATE: 07/02/24   Therapeutic Activities:  -Pink level resistive Theraputty hand strengthening exercises were performed including: gross gripping, gross digit extension, lateral, and 3pt. pinch strengthening, thumb opposition, rolling circular spheres within the tips of the fingers, and manipulating putty within the tips of fingers to remove small objects. Pt. Was provided with a visual handout HEP through [employer] with a video access code.   PATIENT EDUCATION: Education details: Right hand strengthening with pink theraputty ex. Person educated: Patient Education method: Explanation, Demonstration, and Tactile cues Education comprehension: verbalized understanding, returned demonstration, verbal cues required, tactile cues required, and needs further education  HOME EXERCISE PROGRAM:  Continue to provide as indicated  GOALS: Goals reviewed with patient? yes  SHORT TERM GOALS: Target date: 08/08/2024    Pt. Will be independent with HEPs for the RUE. Baseline: Re-eval: HEP for hand strengthening with pink theraputty for grip strength, pinch strength  Eval:  No current HEP  Goal status: INITIAL  LONG  TERM GOALS: Target date: 09/19/2024  Pt. will be improve the MAM-20 score by 2 points to reflect progress with hand function during ADL/IADL tasks. Baseline: Re-eval: MAM-20 Sum score: 63 Eval: MAM-20 Sum score: 69 Goal status: INITIAL  2.  Pt. Will improve Right shoulder flexion AROM by 5 degrees to assist with performing hair care efficiently Baseline: Right shoulder flexion: 111(126), Abduction: 877(872) Eval: Right shoulder flexion: 92(103), Abduction: 82 (90)  Goal status: 9/03 (Re-eval) Revised for hair care  3.  Pt. Will improve BUE strength by 2 muscle grades to assist with ADLs, and IADLs. Baseline: Re-eval: Right shoulder flexion: 3-/5, abduction: 3/5, elbow flexion: 4/5, extension: 4/5, wrist flexion: 4/5, extension: 4/5, Left UE strength: 4/5 overall Eval: Right shoulder flexion: 3-/5, abduction: 3-/5, elbow flexion: 4/5, extension: 4/5, wrist flexion: 4/5, extension: 4/5 Goal status: INITIAL  4.  Pt. Will increase right grip strength by 5# to  be able to securely hold objects Baseline: Re-eval: Grip strength: Right: 65 lbs; Left: 69 lbs Eval: Right: 63#, L: 75# Goal status: INITIAL  5.  Pt. Will increase right pinch strength by 3# to be able to open jars, bottles, containers Baseline: Re-eval: Lateral pinch: Right: 17 lbs, Left: 16 lbs, and 3 point pinch: Right: 15 lbs, Left: 17 lbs Eval: Lateral pinch: right: 15#, left: 16#; 3pt. Pinch: right: 14#, left: 17# Goal status: INITIAL  6.  Pt. Will improve right hand [facility] skills by 3 sec. Of speed to be able to efficiently manipulate small objects/buttons. Baseline: Re-eval:  9 Hole Peg test: Right: 24 sec; Left: 26 sec. Eval:Right: 27 sec. Left: 22 sec. Goal status: INITIAL  6.  Pt. Will write 3 sentences efficiently with 75% legibility in preparation for written correspondence Baseline: Re-eval: Name only: 75% legibility; Eval: name only: 50% legibility  Goal status: INITIAL   ASSESSMENT:  CLINICAL IMPRESSION:  Upon arrival  after PT, Pt. reported that she feels like she may be coming down with a cold. Pt. reports that her colds often turn into the flu. Pt. required visual cues, and cues for visual demonstration of proper technique for each of the theraputty exercises. Pt. started to cough during the session, and had difficulty focusing on the task. Pt. was provided with H20, and had cough drops, however continued to have difficulty focusing 2/2 persistent coughing. SpO2 97% HR 79 bpms. It was determined that discontinuing the therapy session this afternoon was the most appropriate course of action. Pt. was provided with the Pink theraputty, and the HEP. Pt. continues to benefit from OT services to improve right hand function in order to work towards improving, and maximizing engagement in, and independence with ADLs, and IADL tasks.   PERFORMANCE DEFICITS: in functional skills including ADLs, IADLs, coordination, dexterity, proprioception, ROM, strength, Fine motor control, Gross motor control, vision, and UE functional use, pain.cognitive skills including memory, and psychosocial skills coping strategies, environmental adaptation, interpersonal interactions, and routines and behaviors.   IMPAIRMENTS: are limiting patient from ADLs, IADLs, rest and sleep, and leisure.   CO-MORBIDITIES: may have co-morbidities  that affects occupational performance. Patient will benefit from skilled OT to address above impairments and improve overall function.  MODIFICATION OR ASSISTANCE TO COMPLETE EVALUATION: Min-Moderate modification of tasks or assist with assess necessary to complete an evaluation.  OT OCCUPATIONAL PROFILE AND HISTORY: Detailed assessment: Review of records and additional review of physical, cognitive, psychosocial history related to current functional performance.  CLINICAL DECISION MAKING: Moderate - several treatment options, min-mod task modification necessary  REHAB POTENTIAL: Good  EVALUATION COMPLEXITY:  Moderate    PLAN:  OT FREQUENCY: 2x/week  OT DURATION: 12 weeks  PLANNED INTERVENTIONS: 97168 OT Re-evaluation, 97535 self care/ADL training, 02889 therapeutic exercise, 97530 therapeutic activity, 97112 neuromuscular re-education, 97140 manual therapy, 97018 paraffin, 02989 moist heat, 97034 contrast bath, passive range of motion, functional mobility training, patient/family education, and DME and/or AE instructions  RECOMMENDED OTHER SERVICES: OT  CONSULTED AND AGREED WITH PLAN OF CARE: Patient  PLAN FOR NEXT SESSION:   Treatment  Micaela Stith, MS, OTR/L   07/02/2024, 2:46 PM

## 2024-07-02 NOTE — Therapy (Signed)
 OUTPATIENT PHYSICAL THERAPY NEURO EVALUATION   Patient Name: Jamie Williams MRN: [id number] DOB:07-20-1961, 63 y.o., female Today's Date: 07/02/2024   PCP: Tinnie Pro, PA-C REFERRING PROVIDER: Tinnie Pro, PA-C  END OF SESSION:  PT End of Session - 07/02/24 1308     Visit Number 2    Number of Visits 24    Date for PT Re-Evaluation 09/19/24    Progress Note Due on Visit 10    PT Start Time 1315    PT Stop Time 1400    PT Time Calculation (min) 45 min    Equipment Utilized During Treatment Gait belt    Activity Tolerance Patient tolerated treatment well    Behavior During Therapy WFL for tasks assessed/performed          Past Medical History:  Diagnosis Date   Anxiety    Family history of adverse reaction to anesthesia    sister had a difficult time waking up from mastectomy and passed away    GERD (gastroesophageal reflux disease)    History of chicken pox    History of kidney stones    Hyperlipidemia    Hypertension    PONV (postoperative nausea and vomiting)    nausea   Sciatic nerve pain    Past Surgical History:  Procedure Laterality Date   ANTERIOR CERVICAL DECOMP/DISCECTOMY FUSION N/A 12/24/2015   Procedure: Cervical five-six, Cervical six-seven  Anterior cervical decompression/diskectomy/fusion/interbody prosthesis/plate;  Surgeon: Reyes Budge, MD;  Location: [facility]URO [facility];  Service: Neurosurgery;  Laterality: N/A;   COLONOSCOPY WITH PROPOFOL  N/A 07/17/2018   Procedure: COLONOSCOPY WITH PROPOFOL ;  Surgeon: Therisa Bi, MD;  Location: [facility] ENDOSCOPY;  Service: Gastroenterology;  Laterality: N/A;   ECTOPIC PREGNANCY SURGERY  1991 and 1996   two Etopic preg.   Head Injuries  2006   surgery 2006 Cram/NS in [place]. Headaches with increased intracranial pressure. Repeat MRI in 2008 Negative   SUPRACERVICAL ABDOMINAL HYSTERECTOMY  2011   Abdominal; Ovaries intact; CERVIX INTACT. Fibroids/dys menorrhea. [place]   Ulnar Neuropathy Right 2004    Ulnar Neuropathy surgical revision. 2004-2008. [place].   Patient Active Problem List   Diagnosis Date Noted   Acute pancreatitis 05/14/2024   GERD without esophagitis 05/14/2024   Anxiety and depression 05/14/2024   History of stroke 05/14/2024   Cerebrovascular accident (CVA) (HCC) 03/27/2024   Essential hypertension 03/27/2024   Hypothyroidism 03/27/2024   Dyslipidemia 03/27/2024   Peripheral neuropathy 03/27/2024   Ambulatory dysfunction 03/27/2024   Hypothyroid 07/22/2020   Chronic, continuous use of opioids 04/15/2020   Pap smear abnormality of cervix/human papillomavirus (HPV) positive 08/06/2019   Aortic atherosclerosis (HCC) 10/23/2018   GERD (gastroesophageal reflux disease)    Hot flashes due to menopause 01/10/2017   Cervical spondylosis with radiculopathy 12/24/2015   Compression injury of cervical spinal nerve 10/07/2015   Left arm pain 08/29/2015   Neck pain 08/29/2015   Hematuria 07/03/2015   Allergic arthritis 06/27/2015   Arthritis 06/27/2015   Back ache 06/27/2015   Clinical depression 06/27/2015   H/O abnormal cervical Papanicolaou smear 06/27/2015   Elevated intracranial pressure 06/27/2015   Kidney stones 06/27/2015   Knee pain 06/27/2015   Abnormal mammogram 06/27/2015   Headache, migraine 06/27/2015   Neuropathy 06/27/2015   Hand paresthesia 06/27/2015   Pre-diabetes 06/27/2015   Vitamin D  deficiency 06/27/2015   Weight loss 06/27/2015   Adjustment reaction 06/27/2015   Hypercholesterolemia without hypertriglyceridemia 01/29/2010   Tobacco abuse 01/28/2010   Primary hypertension 09/16/2009   Insomnia 09/16/2009  Lesion of ulnar nerve 09/16/2009    ONSET DATE: 03/26/2024  REFERRING DIAG:  M70.101 (ICD-10-CM) - Other symptoms and signs involving the musculoskeletal system  I69.30 (ICD-10-CM) - Unspecified sequelae of cerebral infarction  Z86.73 (ICD-10-CM) - Personal history of transient ischemic attack (TIA), and cerebral infarction  without residual deficits    THERAPY DIAG:  Muscle weakness (generalized)  Other lack of coordination  Unsteadiness on feet  Rationale for Evaluation and Treatment: Rehabilitation  SUBJECTIVE:                                                                                                                                                                                             SUBJECTIVE STATEMENT:  07/02/2024 Pt reports that she had an alright weekend. Continues to have some pain in the Lower back on the L side and the R shoulder. Reports pain 6/10 overall.   Still feels like her RLE is shakey and  weak   FROM EVAL: I feel some better. I just had a hard time in July. Right after I started PT I went to ED with stomach issues then back 5 days later on 05/14/2024- with pancreatitis- spent several days in hospital. Had a f/u with primary and received new order back for PT. I feel like I just don't have any energy On disability- 12-13 years  Pt accompanied by: self  PERTINENT HISTORY: PMH includes: HTN, Anxiety, Dyslipidemia, GERD, Peripheral Neuropathy, Hypothyroidism. Patient had initiated PT services on 04/19/24 was seen twice before going back to hospital and admitted with pancreatitis. She was originally being seen due to have a CVA on 03/26/2024  PAIN:  Are you having pain? Yes: NPRS scale: current 6/10 Pain location: Right shoulder  Pain description: achy Aggravating factors: any use of Right UE Relieving factors: rest, tylenol - helps some  PRECAUTIONS: Fall  RED FLAGS: None   WEIGHT BEARING RESTRICTIONS: No  FALLS: Has patient fallen in last 6 months? Yes. Number of falls 1 fall 3 months ago- sitting on sofa- was getting up and fell forward.   LIVING ENVIRONMENT: Lives with: lives with their family- Husband and son Lives in: House/apartment Stairs: Yes: External: 3 steps; can reach both Has following equipment at home: Single point cane, Walker - 4 wheeled, and  shower chair  PLOF: Independent  PATIENT GOALS: I just want to get better- use my hand and improve my balance.   OBJECTIVE:  Note: Objective measures were completed at Evaluation unless otherwise noted.  DIAGNOSTIC FINDINGS: CLINICAL DATA:  63 year old female with headache and pain. Status post left lateral thalamic/internal capsule lacunar infarcts last month. Epigastric pain radiating under  the left breast with nausea.   EXAM: CT HEAD WITHOUT CONTRAST   TECHNIQUE: Contiguous axial images were obtained from the base of the skull through the vertex without intravenous contrast.   RADIATION DOSE REDUCTION: This exam was performed according to the departmental dose-optimization program which includes automated exposure control, adjustment of the mA and/or kV according to patient size and/or use of iterative reconstruction technique.   COMPARISON:  Brain MRI 03/26/2024. head CT 03/27/2024.   FINDINGS: Brain: Expected small foci of cystic encephalomalacia corresponding to the left deep gray and white matter lacunar infarcts on MRI last month. Elsewhere gray-white differentiation is stable.   Background cerebral volume remains normal for age. No midline shift, ventriculomegaly, mass effect, evidence of mass lesion, intracranial hemorrhage or evidence of cortically based acute infarction.   Vascular: No suspicious intracranial vascular hyperdensity. Calcified atherosclerosis at the skull base.   Skull: Previous suboccipital decompression. No acute osseous abnormality identified.   Sinuses/Orbits: Visualized paranasal sinuses and mastoids are stable and well aerated.   Other: No acute orbit or scalp soft tissue finding.   IMPRESSION: 1.  No acute intracranial abnormality. 2. Expected evolution of the Left deep gray and white matter lacunar infarcts on MRI last month. 3. Previous suboccipital decompression.     Electronically Signed   By: VEAR Hurst M.D.   On: 05/09/2024  03:58  COGNITION: Overall cognitive status: Within functional limits for tasks assessed   SENSATION: R hand numbness and tingling   COORDINATION: Good heel to shin- bilateral  EDEMA:  None observed or reported   POSTURE: rounded shoulders, forward head, and increased thoracic kyphosis  LOWER EXTREMITY ROM:     Active  Right Eval Left Eval  Hip flexion    Hip extension    Hip abduction    Hip adduction    Hip internal rotation    Hip external rotation    Knee flexion    Knee extension    Ankle dorsiflexion    Ankle plantarflexion    Ankle inversion    Ankle eversion     (Blank rows = not tested)  LOWER EXTREMITY MMT:    MMT Right Eval Left Eval  Hip flexion 3+ 4+  Hip extension 4 4+  Hip abduction 4 4+  Hip adduction    Hip internal rotation    Hip external rotation    Knee flexion 4 5  Knee extension 4 5  Ankle dorsiflexion 4+ 5  Ankle plantarflexion    Ankle inversion    Ankle eversion    (Blank rows = not tested)  BED MOBILITY:  Not tested- Patient reports independent   TRANSFERS: Sit to stand: SBA  Assistive device utilized: None     Stand to sit: SBA  Assistive device utilized: None     Chair to chair: SBA  Assistive device utilized: None       RAMP:  Not tested  CURB:  Not tested  STAIRS: Not tested GAIT: Findings: Gait Characteristics: decreased arm swing- Right, decreased step length- Right, decreased step length- Left, and decreased stride length, Distance walked: approx 100 feet, and Level of assistance: SBA  FUNCTIONAL TESTS:  5 times sit to stand: 5 times sit to stand: 18.0 sec without UE Support Timed up and go (TUG): 9.78 sec  6 minute walk test: To be assessed next visit.  10 Meter walk test: 1.17  Berg Balance Scale:  Item Test date: 06/27/2024 Test date:  Test date:   Sitting to standing 4.  able to stand without using hands and stabilize independently Insert OPRCBERGREEVAL SmartPhrase at re-test date Insert OPRCBERGREEVAL  SmartPhrase at re-test date  2. Standing unsupported 4. able to stand safely for 2 minutes    3. Sitting with back unsupported, feet supported 4. able to sit safely and securely for 2 minutes    4. Standing to sitting 3. controls descent by using hands    5. Pivot transfer  4. able to transfer safely with minor use of hands    6. Standing unsupported with eyes closed 4. able to stand 10 seconds safely    7. Standing unsupported with feet together 3. able to place feet together independently and stand 1 minute with supervision    8. Reaching forward with outstretched arms while standing 2. can reach forward 5 cm (2 inches)    9. Pick up object from the floor from standing 3. able to pick up slipper but needs supervision    10. Turning to look behind over left and right shoulders while standing 3. looks behind one side only, other side shows less weight shift    11. Turn 360 degrees 3. able to turn 360 degrees safely, one side only, in 4 seconds or less    12. Place alternate foot on step or stool while standing unsupported 4. ble to stand independently and safely and complete 8 steps in 20 seconds    13. Standing unsupported one foot in front 3. able to place foot ahead independently and hold 30 seconds    14. Standing on one leg 2. able to lift leg independently and hold >= 3 seconds      Total Score 46/56 Total Score /56 Total Score /56     PATIENT SURVEYS:  ABC scale: The Activities-Specific Balance Confidence (ABC) Scale-66.25%                                                                                                                             TREATMENT DATE:   6 Min Walk Test:  Instructed patient to ambulate as quickly and as safely as possible for 6 minutes using LRAD. Patient was allowed to take standing rest breaks without stopping the test, but if the patient required a sitting rest break the clock would be stopped and the test would be over.  Results: 1122 feet (342M) Results  indicate that the patient has reduced endurance with ambulation compared to age matched norms.  Age Matched Norms: 52-69 yo M: 32 F: 53, 28-79 yo M: 67 F: 471, 29-89 yo M: 417 F: 392 MDC: 58.21 meters (190.98 feet) or 50 meters (ANPTA Core Set of Outcome Measures for Adults with Neurologic Conditions, 2018)   Standing on airex pad:  Feet together. Eyes open 30 sec /eyes closed 10 sec   Semitandem 3 x 30 sec bil  Tandem gait with ligh UE support x 70ft and no no UE support x 67ft.   30 sec sit<>stand: 11 without  UE support  Forward/reverse gait with no UE support 77ft x 4   PT provided CGA for safety throughout session with gait belt in place unless otherwise noted.       PATIENT EDUCATION: Education details: Exam details, PT plan of care; Purpose of functional outcome measures, Education in HEP Person educated: Patient Education method: Explanation Education comprehension: verbalized understanding  HOME EXERCISE PROGRAM: Access Code: [email or web address] URL: [email or web address] Date: 07/02/2024 Prepared by: Massie Dollar  Exercises - Tandem Stance  - 1 x daily - 5 x weekly - 3 sets - 4 reps - 30 sec hold - Tandem Walking with Counter Support  - 1 x daily - 5 x weekly - 3 sets - 10 reps - Single Leg Stance with Support  - 1 x daily - 5 x weekly - 3 sets - 4 reps - 20 sec  hold - Sit to Stand with Arms Crossed  - 1 x daily - 5 x weekly - 3 sets - 10 reps - Backward Walking with Counter Support  - 1 x daily - 5 x weekly - 3 sets - 10 reps  GOALS: Goals reviewed with patient? Yes  SHORT TERM GOALS: Target date: 08/08/2024  Patient will be independent in home exercise program to improve strength/mobility for better functional independence with ADLs. Baseline: EVAL - No formal HEP in place- edcuated in Tandem and SLS today and added to HEP.  Goal status: INITIAL   LONG TERM GOALS: Target date: 09/19/2024  1.  Patient will complete five times sit to stand test in <  15 seconds indicating an increased LE strength and improved balance. Baseline: EVAL- 18.0 sec without UE Support Goal status: INITIAL  2.  Patient will improve ABC score by 9 points    to demonstrate statistically significant improvement in mobility and quality of life as it relates to their confidence in her balance.  Baseline: 66.25% Goal status: INITIAL   3.  Patient will increase Berg Balance score by > 6 points to demonstrate decreased fall risk during functional activities. Baseline: EVAL= 46/56 Goal status: INITIAL  4.   Patient will increase six minute walk test distance by >172ft for progression to community ambulator and improve gait ability Baseline: 1,122 Goal status: INITIAL  ASSESSMENT:  CLINICAL IMPRESSION: Patient is a 63 y.o. female who was seen today for physical therapy evaluation and treatment for cerebral infarction. ABC scored 66.25%  able to complete 6 min walk test for >1065ft, but limited compared to aged matched norms. HEP provided to address balance deficits statically and dynamically with light UE support. Will continue to address high level balance to improve confidence and ADLs and community mobility.  Pt will benefit from further skilled PT interventions to improve her mobility, strength, and balance  in order to increase ease and safety with mobility and ADLs and reduce fall risk.   OBJECTIVE IMPAIRMENTS: Abnormal gait, decreased activity tolerance, decreased balance, decreased coordination, decreased endurance, decreased mobility, difficulty walking, and decreased strength.   ACTIVITY LIMITATIONS: carrying, lifting, bending, sitting, standing, squatting, sleeping, stairs, and transfers  PARTICIPATION LIMITATIONS: meal prep, cleaning, laundry, driving, shopping, community activity, and yard work  PERSONAL FACTORS: 3+ comorbidities: knee pain, spinal pain, depression are also affecting patient's functional outcome.   REHAB POTENTIAL: Good  CLINICAL  DECISION MAKING: Evolving/moderate complexity  EVALUATION COMPLEXITY: Moderate  PLAN:  PT FREQUENCY: 1-2x/week  PT DURATION: 12 weeks  PLANNED INTERVENTIONS: 97164- PT Re-evaluation, 97750- Physical Performance Testing, 97110-Therapeutic exercises, 97530- Therapeutic activity, [id number]- Neuromuscular re-education,  02464- Self Care, 02859- Manual therapy, [id number]- Gait training, [id number]- Orthotic Initial, [id number]- Orthotic/Prosthetic subsequent, [phone]- Canalith repositioning, [id number]- Electrical stimulation (manual), [id number]- Ultrasound, [phone] (1-2 muscles), 20561 (3+ muscles)- Dry Needling, Patient/Family education, Balance training, Stair training, Taping, Joint mobilization, Joint manipulation, Spinal manipulation, Spinal mobilization, Vestibular training, DME instructions, Cryotherapy, and Moist heat  PLAN FOR NEXT SESSION:   High level balance training with obstacle course or Blaze pods.     Massie FORBES Dollar, PT 07/02/2024, 1:10 PM

## 2024-07-03 ENCOUNTER — Telehealth: Payer: Self-pay | Admitting: Physician Assistant

## 2024-07-03 NOTE — Telephone Encounter (Signed)
 Per [place] w/ [facility], referral has been closed due to patient not returning calls -Jamie Williams

## 2024-07-04 ENCOUNTER — Ambulatory Visit: Admitting: Physical Therapy

## 2024-07-04 ENCOUNTER — Ambulatory Visit: Admitting: Occupational Therapy

## 2024-07-09 ENCOUNTER — Ambulatory Visit: Admitting: Occupational Therapy

## 2024-07-09 ENCOUNTER — Ambulatory Visit: Admitting: Physical Therapy

## 2024-07-09 DIAGNOSIS — M6281 Muscle weakness (generalized): Secondary | ICD-10-CM

## 2024-07-09 DIAGNOSIS — R278 Other lack of coordination: Secondary | ICD-10-CM

## 2024-07-09 DIAGNOSIS — R2681 Unsteadiness on feet: Secondary | ICD-10-CM

## 2024-07-09 NOTE — Therapy (Signed)
 OUTPATIENT PHYSICAL THERAPY NEURO treatment.    Patient Name: Jamie Williams MRN: [id number] DOB:[birth year], 63 y.o., female Today's Date: 07/09/2024   PCP: Tinnie Pro, PA-C REFERRING PROVIDER: Tinnie Pro, PA-C  END OF SESSION:  PT End of Session - 07/09/24 1321     Visit Number 3    Number of Visits 24    Date for PT Re-Evaluation 09/19/24    Progress Note Due on Visit 10    PT Start Time 1320    PT Stop Time 1400    PT Time Calculation (min) 40 min    Equipment Utilized During Treatment Gait belt    Activity Tolerance Patient tolerated treatment well    Behavior During Therapy WFL for tasks assessed/performed          Past Medical History:  Diagnosis Date   Anxiety    Family history of adverse reaction to anesthesia    sister had a difficult time waking up from mastectomy and passed away    GERD (gastroesophageal reflux disease)    History of chicken pox    History of kidney stones    Hyperlipidemia    Hypertension    PONV (postoperative nausea and vomiting)    nausea   Sciatic nerve pain    Past Surgical History:  Procedure Laterality Date   ANTERIOR CERVICAL DECOMP/DISCECTOMY FUSION N/A 12/24/2015   Procedure: Cervical five-six, Cervical six-seven  Anterior cervical decompression/diskectomy/fusion/interbody prosthesis/plate;  Surgeon: Reyes Budge, MD;  Location: [facility]UR[facility];  Service: Neurosurgery;  Laterality: N/A;   COLONOSCOPY WITH PROPOFOL  N/A 07/17/2018   Procedure: COLONOSCOPY WITH PROPOFOL ;  Surgeon: Therisa Bi, MD;  Location: [facility] ENDOSCOPY;  Service: Gastroenterology;  Laterality: N/A;   ECTOPIC PREGNANCY SURGERY  1991 and 1996   two Etopic preg.   Head Injuries  2006   surgery 2006 Cram/NS in [place]. Headaches with increased intracranial pressure. Repeat MRI in 2008 Negative   SUPRACERVICAL ABDOMINAL HYSTERECTOMY  2011   Abdominal; Ovaries intact; CERVIX INTACT. Fibroids/dys menorrhea. [place]   Ulnar Neuropathy Right 2004    Ulnar Neuropathy surgical revision. 2004-2008. [place].   Patient Active Problem List   Diagnosis Date Noted   Acute pancreatitis 05/14/2024   GERD without esophagitis 05/14/2024   Anxiety and depression 05/14/2024   History of stroke 05/14/2024   Cerebrovascular accident (CVA) (HCC) 03/27/2024   Essential hypertension 03/27/2024   Hypothyroidism 03/27/2024   Dyslipidemia 03/27/2024   Peripheral neuropathy 03/27/2024   Ambulatory dysfunction 03/27/2024   Hypothyroid 07/22/2020   Chronic, continuous use of opioids 04/15/2020   Pap smear abnormality of cervix/human papillomavirus (HPV) positive 08/06/2019   Aortic atherosclerosis (HCC) 10/23/2018   GERD (gastroesophageal reflux disease)    Hot flashes due to menopause 01/10/2017   Cervical spondylosis with radiculopathy 12/24/2015   Compression injury of cervical spinal nerve 10/07/2015   Left arm pain 08/29/2015   Neck pain 08/29/2015   Hematuria 07/03/2015   Allergic arthritis 06/27/2015   Arthritis 06/27/2015   Back ache 06/27/2015   Clinical depression 06/27/2015   H/O abnormal cervical Papanicolaou smear 06/27/2015   Elevated intracranial pressure 06/27/2015   Kidney stones 06/27/2015   Knee pain 06/27/2015   Abnormal mammogram 06/27/2015   Headache, migraine 06/27/2015   Neuropathy 06/27/2015   Hand paresthesia 06/27/2015   Pre-diabetes 06/27/2015   Vitamin D  deficiency 06/27/2015   Weight loss 06/27/2015   Adjustment reaction 06/27/2015   Hypercholesterolemia without hypertriglyceridemia 01/29/2010   Tobacco abuse 01/28/2010   Primary hypertension 09/16/2009   Insomnia  09/16/2009   Lesion of ulnar nerve 09/16/2009    ONSET DATE: 03/26/2024  REFERRING DIAG:  M70.101 (ICD-10-CM) - Other symptoms and signs involving the musculoskeletal system  I69.30 (ICD-10-CM) - Unspecified sequelae of cerebral infarction  Z86.73 (ICD-10-CM) - Personal history of transient ischemic attack (TIA), and cerebral infarction  without residual deficits    THERAPY DIAG:  Muscle weakness (generalized)  Other lack of coordination  Unsteadiness on feet  Rationale for Evaluation and Treatment: Rehabilitation  SUBJECTIVE:                                                                                                                                                                                             SUBJECTIVE STATEMENT:  07/09/2024 Pt states that low back is bothering her a little more today. Rates pain 8/10 on this day. States that some days are worse than others, and today os a rough day.    FROM EVAL: I feel some better. I just had a hard time in July. Right after I started PT I went to ED with stomach issues then back 5 days later on 05/14/2024- with pancreatitis- spent several days in hospital. Had a f/u with primary and received new order back for PT. I feel like I just don't have any energy On disability- 12-13 years  Pt accompanied by: self  PERTINENT HISTORY: PMH includes: HTN, Anxiety, Dyslipidemia, GERD, Peripheral Neuropathy, Hypothyroidism. Patient had initiated PT services on 04/19/24 was seen twice before going back to hospital and admitted with pancreatitis. She was originally being seen due to have a CVA on 03/26/2024  PAIN:  Are you having pain? Yes: NPRS scale: current 6/10 Pain location: Right shoulder  Pain description: achy Aggravating factors: any use of Right UE Relieving factors: rest, tylenol - helps some  PRECAUTIONS: Fall  RED FLAGS: None   WEIGHT BEARING RESTRICTIONS: No  FALLS: Has patient fallen in last 6 months? Yes. Number of falls 1 fall 3 months ago- sitting on sofa- was getting up and fell forward.   LIVING ENVIRONMENT: Lives with: lives with their family- Husband and son Lives in: House/apartment Stairs: Yes: External: 3 steps; can reach both Has following equipment at home: Single point cane, Walker - 4 wheeled, and shower chair  PLOF:  Independent  PATIENT GOALS: I just want to get better- use my hand and improve my balance.   OBJECTIVE:  Note: Objective measures were completed at Evaluation unless otherwise noted.  DIAGNOSTIC FINDINGS: CLINICAL DATA:  63 year old female with headache and pain. Status post left lateral thalamic/internal capsule lacunar infarcts last month. Epigastric pain radiating under the left breast with nausea.  EXAM: CT HEAD WITHOUT CONTRAST   TECHNIQUE: Contiguous axial images were obtained from the base of the skull through the vertex without intravenous contrast.   RADIATION DOSE REDUCTION: This exam was performed according to the departmental dose-optimization program which includes automated exposure control, adjustment of the mA and/or kV according to patient size and/or use of iterative reconstruction technique.   COMPARISON:  Brain MRI 03/26/2024. head CT 03/27/2024.   FINDINGS: Brain: Expected small foci of cystic encephalomalacia corresponding to the left deep gray and white matter lacunar infarcts on MRI last month. Elsewhere gray-white differentiation is stable.   Background cerebral volume remains normal for age. No midline shift, ventriculomegaly, mass effect, evidence of mass lesion, intracranial hemorrhage or evidence of cortically based acute infarction.   Vascular: No suspicious intracranial vascular hyperdensity. Calcified atherosclerosis at the skull base.   Skull: Previous suboccipital decompression. No acute osseous abnormality identified.   Sinuses/Orbits: Visualized paranasal sinuses and mastoids are stable and well aerated.   Other: No acute orbit or scalp soft tissue finding.   IMPRESSION: 1.  No acute intracranial abnormality. 2. Expected evolution of the Left deep gray and white matter lacunar infarcts on MRI last month. 3. Previous suboccipital decompression.     Electronically Signed   By: VEAR Hurst M.D.   On: 05/09/2024  03:58  COGNITION: Overall cognitive status: Within functional limits for tasks assessed   SENSATION: R hand numbness and tingling   COORDINATION: Good heel to shin- bilateral  EDEMA:  None observed or reported   POSTURE: rounded shoulders, forward head, and increased thoracic kyphosis  LOWER EXTREMITY ROM:     Active  Right Eval Left Eval  Hip flexion    Hip extension    Hip abduction    Hip adduction    Hip internal rotation    Hip external rotation    Knee flexion    Knee extension    Ankle dorsiflexion    Ankle plantarflexion    Ankle inversion    Ankle eversion     (Blank rows = not tested)  LOWER EXTREMITY MMT:    MMT Right Eval Left Eval  Hip flexion 3+ 4+  Hip extension 4 4+  Hip abduction 4 4+  Hip adduction    Hip internal rotation    Hip external rotation    Knee flexion 4 5  Knee extension 4 5  Ankle dorsiflexion 4+ 5  Ankle plantarflexion    Ankle inversion    Ankle eversion    (Blank rows = not tested)  BED MOBILITY:  Not tested- Patient reports independent   TRANSFERS: Sit to stand: SBA  Assistive device utilized: None     Stand to sit: SBA  Assistive device utilized: None     Chair to chair: SBA  Assistive device utilized: None       RAMP:  Not tested  CURB:  Not tested  STAIRS: Not tested GAIT: Findings: Gait Characteristics: decreased arm swing- Right, decreased step length- Right, decreased step length- Left, and decreased stride length, Distance walked: approx 100 feet, and Level of assistance: SBA  FUNCTIONAL TESTS:  5 times sit to stand: 5 times sit to stand: 18.0 sec without UE Support Timed up and go (TUG): 9.78 sec  6 minute walk test: To be assessed next visit.  10 Meter walk test: 1.17  Berg Balance Scale:  Item Test date: 06/27/2024 Test date:  Test date:   Sitting to standing 4. able to stand without using hands and  stabilize independently Insert OPRCBERGREEVAL SmartPhrase at re-test date Insert OPRCBERGREEVAL  SmartPhrase at re-test date  2. Standing unsupported 4. able to stand safely for 2 minutes    3. Sitting with back unsupported, feet supported 4. able to sit safely and securely for 2 minutes    4. Standing to sitting 3. controls descent by using hands    5. Pivot transfer  4. able to transfer safely with minor use of hands    6. Standing unsupported with eyes closed 4. able to stand 10 seconds safely    7. Standing unsupported with feet together 3. able to place feet together independently and stand 1 minute with supervision    8. Reaching forward with outstretched arms while standing 2. can reach forward 5 cm (2 inches)    9. Pick up object from the floor from standing 3. able to pick up slipper but needs supervision    10. Turning to look behind over left and right shoulders while standing 3. looks behind one side only, other side shows less weight shift    11. Turn 360 degrees 3. able to turn 360 degrees safely, one side only, in 4 seconds or less    12. Place alternate foot on step or stool while standing unsupported 4. ble to stand independently and safely and complete 8 steps in 20 seconds    13. Standing unsupported one foot in front 3. able to place foot ahead independently and hold 30 seconds    14. Standing on one leg 2. able to lift leg independently and hold >= 3 seconds      Total Score 46/56 Total Score /56 Total Score /56     PATIENT SURVEYS:  ABC scale: The Activities-Specific Balance Confidence (ABC) Scale-66.25%                                                                                                                             TREATMENT DATE:   Nustep rolling hill program levl 2-6 with cues to maintain consistent SPM through varied resistance >40 as tolerated.  States that back may feel a little looser upon completion   Standing on airex beam: Normal BOS x 30 sec  Head nods with UE support x 3 with and without UE support x 10  Head turns x 3 with UE support  and without UE support  Eyes closed 3 x 10 sec  Tandem stance 2 x 15 sec bil with tactile cues to improve awareness of lateral LOB   Weighted gait with 4# AW 3x 363ft with cues for full step height to prevent foot drag on BLE. Mild SOB on first bout, not noted on second or third.    Side stepping over 4 bolsters x 7 bil with 4 # AWs. Cues for improved step width initially, and able to maintain technique without additional instruction    PT provided CGA for safety throughout session with gait belt in place unless otherwise noted.  PATIENT EDUCATION: Education details: Exam details, PT plan of care; Purpose of functional outcome measures, Education in HEP Person educated: Patient Education method: Explanation Education comprehension: verbalized understanding  HOME EXERCISE PROGRAM: Access Code: [email or web address] URL: [email or web address] Date: 07/02/2024 Prepared by: Massie Dollar  Exercises - Tandem Stance  - 1 x daily - 5 x weekly - 3 sets - 4 reps - 30 sec hold - Tandem Walking with Counter Support  - 1 x daily - 5 x weekly - 3 sets - 10 reps - Single Leg Stance with Support  - 1 x daily - 5 x weekly - 3 sets - 4 reps - 20 sec  hold - Sit to Stand with Arms Crossed  - 1 x daily - 5 x weekly - 3 sets - 10 reps - Backward Walking with Counter Support  - 1 x daily - 5 x weekly - 3 sets - 10 reps  GOALS: Goals reviewed with patient? Yes  SHORT TERM GOALS: Target date: 08/08/2024  Patient will be independent in home exercise program to improve strength/mobility for better functional independence with ADLs. Baseline: EVAL - No formal HEP in place- edcuated in Tandem and SLS today and added to HEP.  Goal status: INITIAL   LONG TERM GOALS: Target date: 09/19/2024  1.  Patient will complete five times sit to stand test in < 15 seconds indicating an increased LE strength and improved balance. Baseline: EVAL- 18.0 sec without UE Support Goal status: INITIAL  2.   Patient will improve ABC score by 9 points    to demonstrate statistically significant improvement in mobility and quality of life as it relates to their confidence in her balance.  Baseline: 66.25% Goal status: INITIAL   3.  Patient will increase Berg Balance score by > 6 points to demonstrate decreased fall risk during functional activities. Baseline: EVAL= 46/56 Goal status: INITIAL  4.   Patient will increase six minute walk test distance by >197ft for progression to community ambulator and improve gait ability Baseline: 1,122 Goal status: INITIAL  ASSESSMENT:  CLINICAL IMPRESSION: Patient is a 63 y.o. female who was seen today for physical therapy  treatment for cerebral infarction. PT treatment focused on static and dynamic balance as well as functional strengthening with weighted gait in various planes of movement. Pt tolerated well and reports mild decrease in LBP compared to start of session.  Pt will benefit from further skilled PT interventions to improve her mobility, strength, and balance  in order to increase ease and safety with mobility and ADLs and reduce fall risk.   OBJECTIVE IMPAIRMENTS: Abnormal gait, decreased activity tolerance, decreased balance, decreased coordination, decreased endurance, decreased mobility, difficulty walking, and decreased strength.   ACTIVITY LIMITATIONS: carrying, lifting, bending, sitting, standing, squatting, sleeping, stairs, and transfers  PARTICIPATION LIMITATIONS: meal prep, cleaning, laundry, driving, shopping, community activity, and yard work  PERSONAL FACTORS: 3+ comorbidities: knee pain, spinal pain, depression are also affecting patient's functional outcome.   REHAB POTENTIAL: Good  CLINICAL DECISION MAKING: Evolving/moderate complexity  EVALUATION COMPLEXITY: Moderate  PLAN:  PT FREQUENCY: 1-2x/week  PT DURATION: 12 weeks  PLANNED INTERVENTIONS: 97164- PT Re-evaluation, 97750- Physical Performance Testing,  97110-Therapeutic exercises, 97530- Therapeutic activity, [id number]- Neuromuscular re-education, 97535- Self Care, 02859- Manual therapy, [id number]- Gait training, [phone]- Orthotic Initial, [phone]- Orthotic/Prosthetic subsequent, [phone]- Canalith repositioning, [id number]- Electrical stimulation (manual), [id number]- Ultrasound, [id number] (1-2 muscles), [id number] (3+ muscles)- Dry Needling, Patient/Family education, Balance training, Stair training, Taping, Joint mobilization, Joint manipulation, Spinal manipulation, Spinal mobilization, Vestibular training,  DME instructions, Cryotherapy, and Moist heat  PLAN FOR NEXT SESSION:   High level balance training with obstacle course or Blaze pods.     Massie FORBES Dollar, PT 07/09/2024, 1:22 PM

## 2024-07-09 NOTE — Therapy (Signed)
 OUTPATIENT OCCUPATIONAL THERAPY NEURO TREATMENT NOTE  Patient Name: Jamie Williams MRN: [id number] DOB:May 30, 1961, 63 y.o., female Today's Date: 07/09/2024  PCP: Kristina Tinnie POUR, PA-C REFERRING PROVIDER: Liana Fish, NP  END OF SESSION:  OT End of Session - 07/09/24 1650     Visit Number 4    Number of Visits 24    Date for OT Re-Evaluation 09/19/24    OT Start Time 1400    OT Stop Time 1445    OT Time Calculation (min) 45 min    Activity Tolerance Patient tolerated treatment well    Behavior During Therapy WFL for tasks assessed/performed              Past Medical History:  Diagnosis Date   Anxiety    Family history of adverse reaction to anesthesia    sister had a difficult time waking up from mastectomy and passed away    GERD (gastroesophageal reflux disease)    History of chicken pox    History of kidney stones    Hyperlipidemia    Hypertension    PONV (postoperative nausea and vomiting)    nausea   Sciatic nerve pain    Past Surgical History:  Procedure Laterality Date   ANTERIOR CERVICAL DECOMP/DISCECTOMY FUSION N/A 12/24/2015   Procedure: Cervical five-six, Cervical six-seven  Anterior cervical decompression/diskectomy/fusion/interbody prosthesis/plate;  Surgeon: Reyes Budge, MD;  Location: [facility]UR[facility]S;  Service: Neurosurgery;  Laterality: N/A;   COLONOSCOPY WITH PROPOFOL  N/A 07/17/2018   Procedure: COLONOSCOPY WITH PROPOFOL ;  Surgeon: Therisa Bi, MD;  Location: [facility] ENDOSCOPY;  Service: Gastroenterology;  Laterality: N/A;   ECTOPIC PREGNANCY SURGERY  1991 and 1996   two Etopic preg.   Head Injuries  2006   surgery 2006 Cram/NS in [place]. Headaches with increased intracranial pressure. Repeat MRI in 2008 Negative   SUPRACERVICAL ABDOMINAL HYSTERECTOMY  2011   Abdominal; Ovaries intact; CERVIX INTACT. Fibroids/dys menorrhea. Weaver-Lee   Ulnar Neuropathy Right 2004   Ulnar Neuropathy surgical revision. 2004-2008. [place].    Patient Active Problem List   Diagnosis Date Noted   Acute pancreatitis 05/14/2024   GERD without esophagitis 05/14/2024   Anxiety and depression 05/14/2024   History of stroke 05/14/2024   Cerebrovascular accident (CVA) (HCC) 03/27/2024   Essential hypertension 03/27/2024   Hypothyroidism 03/27/2024   Dyslipidemia 03/27/2024   Peripheral neuropathy 03/27/2024   Ambulatory dysfunction 03/27/2024   Hypothyroid 07/22/2020   Chronic, continuous use of opioids 04/15/2020   Pap smear abnormality of cervix/human papillomavirus (HPV) positive 08/06/2019   Aortic atherosclerosis (HCC) 10/23/2018   GERD (gastroesophageal reflux disease)    Hot flashes due to menopause 01/10/2017   Cervical spondylosis with radiculopathy 12/24/2015   Compression injury of cervical spinal nerve 10/07/2015   Left arm pain 08/29/2015   Neck pain 08/29/2015   Hematuria 07/03/2015   Allergic arthritis 06/27/2015   Arthritis 06/27/2015   Back ache 06/27/2015   Clinical depression 06/27/2015   H/O abnormal cervical Papanicolaou smear 06/27/2015   Elevated intracranial pressure 06/27/2015   Kidney stones 06/27/2015   Knee pain 06/27/2015   Abnormal mammogram 06/27/2015   Headache, migraine 06/27/2015   Neuropathy 06/27/2015   Hand paresthesia 06/27/2015   Pre-diabetes 06/27/2015   Vitamin D  deficiency 06/27/2015   Weight loss 06/27/2015   Adjustment reaction 06/27/2015   Hypercholesterolemia without hypertriglyceridemia 01/29/2010   Tobacco abuse 01/28/2010   Primary hypertension 09/16/2009   Insomnia 09/16/2009   Lesion of ulnar nerve 09/16/2009    ONSET DATE: 03/26/24  REFERRING DIAG: CVA  THERAPY DIAG:  Weakness, lack of coordination  Rationale for Evaluation and Treatment: Rehabilitation  SUBJECTIVE:   SUBJECTIVE STATEMENT:  Pt. reports  feeling much better than the last time she was here. Pt accompanied by: self  PERTINENT HISTORY:   Addendum: Pt. was previously evaluated by OT  services 2/2 a CVA as per the pertinent history below. Pt., however, was hospitalized from 7/21/-05/16/24 for Pancreatitis and abdominal pain shortly after being evaluated.    Pt. is a 63 y.o. female who was admitted to the hospital with an acute onset of back pain, trouble walking RUE and LE weakness. Imaging revealed an acute ischemic vertebrobasilar artery thalamic stroke, and Degenerative Changes of the Lumbar spine  L5-S1. PHMx includes: Peripheral neuropathy, Essential HTN, Hypothyroidism, Dyslipidemia, Ambulatory Dysfunction. Pt. has a history of neck, and right shoulder pain with a planned MRI in August 2025. Pt. Has a History of remote right elbow injury from working in the [place].   PRECAUTIONS: None  WEIGHT BEARING RESTRICTIONS: No  PAIN:   07/09/24: 8/10 pain in the right shoulder  07/02/24: 6/10 pain in the shoulder, and back Eval: Are you having pain? Right  shoulder 7/10 pain. Has 7/10 Sciatica; sometimes reaches a 10/10 at home; arthritis in the spine  FALLS: Has patient fallen in last 6 months? 1 fall getting up from the couch.  LIVING ENVIRONMENT: Lives with: lives with their spouse and lives with their son Lives in: House/apartment Stairs:  3 steps to enter; one level home Has following equipment at home: Counselling psychologist, BSCommode, rollator  PLOF: Independent  PATIENT GOALS: To get back to her PLOF  OBJECTIVE:  Note: Objective measures were completed at Evaluation unless otherwise noted.  HAND DOMINANCE: Right  ADLs:  Transfers/ambulation related to ADLs: Independent Eating: Independent, increased time required, loses grip on a glass, difficulty cutting food Grooming: Independent UB Dressing: Independent, some difficulty with buttoning LB Dressing: independent Toileting: Independent Bathing: Independent  Tub Shower transfers: Independent; distance supervision -showers when family is home   IADLs: Shopping: Sister has been doing the grocery  shopping Light housekeeping: Writer,  has not washed dishes, independent with bedmaking increased time  2/2 decreased balance Meal Prep: Independent Community mobility: Drives a little bit Medication management: Has difficulty using the right hand to open medication bottles Financial management: No changes in the process Handwriting: 75% legibility for name only Typing/cellphone use: Difficulty using then right to manage the cellphone. 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 Hobbies: Crafts  MOBILITY STATUS: Independent without an assistive device    ACTIVITY TOLERANCE: Activity tolerance: WFL  FUNCTIONAL OUTCOME MEASURES: Eval: MAM Measure Sum Score: 69 Re-eval: MAM Measure Score: 63  UPPER EXTREMITY ROM:    Active ROM Right Eval 04/19/24 Right Re-Eval 06/27/24 Left Eval 04/19/24 Left: Re-eval 06/27/24   Shoulder flexion 92(103) 111(126) WFL WFL  Shoulder abduction 82(90) 122(127) [facility] WFL  Shoulder adduction      Shoulder extension      Shoulder internal rotation      Shoulder external rotation      Elbow flexion [facility] WFL WFL  Elbow extension [facility] WFL  Wrist flexion [facility] WFL WFL  Wrist extension [facility] WFL WFL  Wrist ulnar deviation      Wrist radial deviation      Wrist pronation      Wrist supination      (Blank rows = not tested)  UPPER EXTREMITY  MMT:     MMT Right Eval 04/19/24 Right Re-eval 06/27/24 Left Eval 04/19/24 Right  Re-eval 06/27/24  Shoulder flexion 3-/5 3-/5 5/5 4+/5  Shoulder abduction 3-/5 3/5 5/5 4+/5  Shoulder adduction      Shoulder extension      Shoulder internal rotation      Shoulder external rotation      Middle trapezius      Lower trapezius      Elbow flexion 4/5 4/5  5/5 4+/5  Elbow extension 4/5 4/5 5/5 4+/5  Wrist flexion 4/5 4/5 5/5 4+/5  Wrist extension 4/5 4/5 5/5 4+/5  Wrist ulnar deviation      Wrist radial deviation      Wrist pronation      Wrist supination       (Blank rows = not tested)  HAND FUNCTION:  Re-eval: Grip strength: Right: 65 lbs; Left: 69 lbs, Lateral pinch: Right: 17 lbs, Left: 16 lbs, and 3 point pinch: Right: 15 lbs, Left: 17 lbs  Eval: Grip strength: Right: 63 lbs; Left: 75 lbs, Lateral pinch: Right: 15 lbs, Left: 16 lbs, and 3 point pinch: Right: 14 lbs, Left: 17 lbs  COORDINATION:   Re-eval: 9 Hole Peg test: Right: 24 sec; Left: 26 sec.   Eval: 9 Hole Peg test: Right: 26 sec; Left: 22 sec  SENSATION: tingling/numb WFL Light touch: WFL Proprioception: WFL  EDEMA: WFL   COGNITION: Overall cognitive status: Within functional limits for tasks assessed; Pt. Reports that sometimes she can remember things, and sometimes  VISION: Subjective report: blurriness since the stroke   PRAXIS: Impaired fine motor control   TREATMENT DATE: 07/09/24   Therapeutic Ex.:   -Pt. performed AROM followed by PROM to the end ranges of motion within pain tolerance  Therapeutic Activities:  -Facilitated right hand progressive gross grip strengthening with 23.4# of grip strength resistive force. -Facilitated sustained gross grasp on the gripper while reaching up to place the pegs into a target container. -Facilitated right Lateral, and 3pt. Pinch strengthening using yellow, red, green, and blue, and black level resistive clips   Manual Therapy:  -Pt. tolerated STM to the right scapular, and shoulder musculature 2/2 pain, and tightness -Pt. was provided with BioFreeze to the right trap, and shoulder musculature 2/2 pain. -Manual therapy was performed independent of, and in preparation for therapeutic Ex.   Neuromuscular re-education:   -Facilitated FMC tasks using the Grooved pegboard, grasping and 1 grooved pegs from a horizontal position in the shallow dish, and transitioning them to a vertical position in preparation for placing them upright into the pegboard.  -Facilitated thumb opposition from the tip of the thumb to the tip  of the 2nd through 5th digit.       PATIENT EDUCATION: Education details:  RUE functioning Person educated: Patient Education method: Explanation, Demonstration, and Tactile cues Education comprehension: verbalized understanding, returned demonstration, verbal cues required, tactile cues required, and needs further education  HOME EXERCISE PROGRAM:  Continue to provide as indicated  GOALS: Goals reviewed with patient? yes  SHORT TERM GOALS: Target date: 08/08/2024    Pt. Will be independent with HEPs for the RUE. Baseline: Re-eval: HEP for hand strengthening with pink theraputty for grip strength, pinch strength  Eval:  No current HEP  Goal status: INITIAL  LONG TERM GOALS: Target date: 09/19/2024  Pt. will be improve the MAM-20 score by 2 points to reflect progress with hand function during ADL/IADL tasks. Baseline: Re-eval: MAM-20 Sum score: 63 Eval: MAM-20  Sum score: 69 Goal status: INITIAL  2.  Pt. Will improve Right shoulder flexion AROM by 5 degrees to assist with performing hair care efficiently Baseline: Right shoulder flexion: 111(126), Abduction: 877(872) Eval: Right shoulder flexion: 92(103), Abduction: 82 (90)  Goal status: 9/03 (Re-eval) Revised for hair care  3.  Pt. Will improve BUE strength by 2 muscle grades to assist with ADLs, and IADLs. Baseline: Re-eval: Right shoulder flexion: 3-/5, abduction: 3/5, elbow flexion: 4/5, extension: 4/5, wrist flexion: 4/5, extension: 4/5, Left UE strength: 4/5 overall Eval: Right shoulder flexion: 3-/5, abduction: 3-/5, elbow flexion: 4/5, extension: 4/5, wrist flexion: 4/5, extension: 4/5 Goal status: INITIAL  4.  Pt. Will increase right grip strength by 5# to be able to securely hold objects Baseline: Re-eval: Grip strength: Right: 65 lbs; Left: 69 lbs Eval: Right: 63#, L: 75# Goal status: INITIAL  5.  Pt. Will increase right pinch strength by 3# to be able to open jars, bottles, containers Baseline: Re-eval: Lateral  pinch: Right: 17 lbs, Left: 16 lbs, and 3 point pinch: Right: 15 lbs, Left: 17 lbs Eval: Lateral pinch: right: 15#, left: 16#; 3pt. Pinch: right: 14#, left: 17# Goal status: INITIAL  6.  Pt. Will improve right hand [facility] skills by 3 sec. Of speed to be able to efficiently manipulate small objects/buttons. Baseline: Re-eval:  9 Hole Peg test: Right: 24 sec; Left: 26 sec. Eval:Right: 27 sec. Left: 22 sec. Goal status: INITIAL  6.  Pt. Will write 3 sentences efficiently with 75% legibility in preparation for written correspondence Baseline: Re-eval: Name only: 75% legibility; Eval: name only: 50% legibility  Goal status: INITIAL   ASSESSMENT:  CLINICAL IMPRESSION:  Pt. reports that her cough is much better today. Upon arrival Pt. Reported 8/10 right shoulder pain. Pt. Pain improved to 7/10 following treatrment. Pt. was able to tolerate right grip and pinch strengthening with cues for lateral, and 3pt. pinch position. Pt. requires verbal cues, and cues for visual demonstration of  [facility] skills, and thumb opposition tasks.  Pt. continues to benefit from OT services to improve right hand function in order to work towards improving, and maximizing engagement in, and independence with ADLs, and IADL tasks.   PERFORMANCE DEFICITS: in functional skills including ADLs, IADLs, coordination, dexterity, proprioception, ROM, strength, Fine motor control, Gross motor control, vision, and UE functional use, pain.cognitive skills including memory, and psychosocial skills coping strategies, environmental adaptation, interpersonal interactions, and routines and behaviors.   IMPAIRMENTS: are limiting patient from ADLs, IADLs, rest and sleep, and leisure.   CO-MORBIDITIES: may have co-morbidities  that affects occupational performance. Patient will benefit from skilled OT to address above impairments and improve overall function.  MODIFICATION OR ASSISTANCE TO COMPLETE EVALUATION: Min-Moderate modification of tasks or  assist with assess necessary to complete an evaluation.  OT OCCUPATIONAL PROFILE AND HISTORY: Detailed assessment: Review of records and additional review of physical, cognitive, psychosocial history related to current functional performance.  CLINICAL DECISION MAKING: Moderate - several treatment options, min-mod task modification necessary  REHAB POTENTIAL: Good  EVALUATION COMPLEXITY: Moderate    PLAN:  OT FREQUENCY: 2x/week  OT DURATION: 12 weeks  PLANNED INTERVENTIONS: 97168 OT Re-evaluation, 97535 self care/ADL training, 02889 therapeutic exercise, 97530 therapeutic activity, 97112 neuromuscular re-education, 97140 manual therapy, 97018 paraffin, 02989 moist heat, 97034 contrast bath, passive range of motion, functional mobility training, patient/family education, and DME and/or AE instructions  RECOMMENDED OTHER SERVICES: OT  CONSULTED AND AGREED WITH PLAN OF CARE: Patient  PLAN FOR NEXT SESSION:  Treatment  Estefania Kamiya, MS, OTR/L   07/09/2024, 4:52 PM

## 2024-07-16 ENCOUNTER — Encounter: Payer: Self-pay | Admitting: Physician Assistant

## 2024-07-16 ENCOUNTER — Ambulatory Visit: Admitting: Physical Therapy

## 2024-07-16 ENCOUNTER — Ambulatory Visit: Admitting: Physician Assistant

## 2024-07-16 ENCOUNTER — Ambulatory Visit: Admitting: Occupational Therapy

## 2024-07-16 VITALS — BP 154/93 | HR 75 | Temp 98.0°F | Resp 16 | Ht 65.0 in | Wt 167.0 lb

## 2024-07-16 DIAGNOSIS — M6281 Muscle weakness (generalized): Secondary | ICD-10-CM

## 2024-07-16 DIAGNOSIS — R278 Other lack of coordination: Secondary | ICD-10-CM | POA: Diagnosis not present

## 2024-07-16 DIAGNOSIS — I10 Essential (primary) hypertension: Secondary | ICD-10-CM

## 2024-07-16 DIAGNOSIS — R2681 Unsteadiness on feet: Secondary | ICD-10-CM

## 2024-07-16 DIAGNOSIS — F064 Anxiety disorder due to known physiological condition: Secondary | ICD-10-CM

## 2024-07-16 NOTE — Progress Notes (Signed)
 [facility] [address] [place], [place] [address]  Internal MEDICINE  Office Visit Note  Patient Name: Jamie Williams  [id number]  [id number]  Date of Service: 07/16/2024  Chief Complaint  Patient presents with   Follow-up   Gastroesophageal Reflux   Hypertension   Hyperlipidemia   Anxiety   Quality Metric Gaps    AWV, TDAP    HPI Pt is here for routine follow up -water leak in front yard, tree roots grew into pipes so dealing with this and may contribute to elevated BP -hasn't gotten labs yet due to this -taking lisinopril  and hydralazine  in AM one time -BP at home 138/74, will bring cuff next visit and log -doing well on increased lexapro , feeling better overall outside of stress from pipe/water issue -therapy going well, 2x per week  Current Medication: Outpatient Encounter Medications as of 07/16/2024  Medication Sig   aspirin  EC 81 MG tablet Take 1 tablet (81 mg total) by mouth daily. Swallow whole.   atorvastatin  (LIPITOR) 80 MG tablet Take 1 tablet (80 mg total) by mouth at bedtime.   benzonatate  (TESSALON ) 200 MG capsule Take 1 capsule (200 mg total) by mouth 3 (three) times daily as needed for cough.   cholecalciferol  (VITAMIN D ) 1000 units tablet Take 2,000 Units by mouth daily.    colchicine  0.6 MG tablet Take 2 tablets (1.2 mg) on first day of gout flare, then 1 tablet daily   escitalopram  (LEXAPRO ) 20 MG tablet Take 1 tablet (20 mg total) by mouth daily.   levothyroxine  (SYNTHROID ) 50 MCG tablet Take 1 tablet (50 mcg total) by mouth daily.   nicotine  (NICODERM CQ  - DOSED IN MG/24 HR) 7 mg/24hr patch PLACE 1 PATCH ONTO THE SKIN DAILY   ondansetron  (ZOFRAN -ODT) 4 MG disintegrating tablet Take 1 tablet (4 mg total) by mouth every 8 (eight) hours as needed for nausea or vomiting.   pantoprazole  (PROTONIX ) 40 MG tablet Take 1 tablet (40 mg total) by mouth daily.   promethazine  (PHENERGAN ) 25 MG tablet TAKE 1 TABLET(25 MG) BY MOUTH EVERY 6 HOURS AS  NEEDED FOR NAUSEA OR VOMITING   sucralfate  (CARAFATE ) 1 GM/10ML suspension Take 10 mLs (1 g total) by mouth every 8 (eight) hours as needed.   [DISCONTINUED] hydrALAZINE  (APRESOLINE ) 50 MG tablet Take 1 tablet (50 mg total) by mouth 3 (three) times daily as needed (If systolic BP greater than 150 mmHg.). If systolic BP greater than 150 mmHg.   [DISCONTINUED] lisinopril  (ZESTRIL ) 40 MG tablet Take 1 tablet (40 mg total) by mouth daily.   No facility-administered encounter medications on file as of 07/16/2024.    Surgical History: Past Surgical History:  Procedure Laterality Date   ANTERIOR CERVICAL DECOMP/DISCECTOMY FUSION N/A 12/24/2015   Procedure: Cervical five-six, Cervical six-seven  Anterior cervical decompression/diskectomy/fusion/interbody prosthesis/plate;  Surgeon: Reyes Budge, MD;  Location: [facility] NEURO ORS;  Service: Neurosurgery;  Laterality: N/A;   COLONOSCOPY WITH PROPOFOL  N/A 07/17/2018   Procedure: COLONOSCOPY WITH PROPOFOL ;  Surgeon: Therisa Bi, MD;  Location: [facility] ENDOSCOPY;  Service: Gastroenterology;  Laterality: N/A;   ECTOPIC PREGNANCY SURGERY  1991 and 1996   two Etopic preg.   Head Injuries  2006   surgery 2006 Cram/NS in [place]. Headaches with increased intracranial pressure. Repeat MRI in 2008 Negative   SUPRACERVICAL ABDOMINAL HYSTERECTOMY  2011   Abdominal; Ovaries intact; CERVIX INTACT. Fibroids/dys menorrhea. Weaver-Lee   Ulnar Neuropathy Right 2004   Ulnar Neuropathy surgical revision. 2004-2008. [place].    Medical History:  Past Medical History:  Diagnosis Date   Anxiety    Family history of adverse reaction to anesthesia    sister had a difficult time waking up from mastectomy and passed away    GERD (gastroesophageal reflux disease)    History of chicken pox    History of kidney stones    Hyperlipidemia    Hypertension    PONV (postoperative nausea and vomiting)    nausea   Sciatic nerve pain     Family History: Family History   Problem Relation Age of Onset   Hypertension Mother    Diabetes Mother        Type 2   Breast cancer Mother 99   Cancer Sister 98       Breast   Diabetes Sister        type 2   Breast cancer Sister 5   Breast cancer Sister    Diabetes Sister    Diabetes Sister    Diabetes Sister        type 2   Liver cancer Brother    Stomach cancer Brother     Social History   Socioeconomic History   Marital status: Married    Spouse name: Not on file   Number of children: 1   Years of education: Not on file   Highest education level: 12th grade  Occupational History   Occupation: disabled  Tobacco Use   Smoking status: Every Day    Current packs/day: 0.50    Types: Cigarettes   Smokeless tobacco: Never   Tobacco comments:    5 cigarettes daily.  Vaping Use   Vaping status: Never Used  Substance and Sexual Activity   Alcohol use: Not Currently    Alcohol/week: 0.0 standard drinks of alcohol   Drug use: Not Currently   Sexual activity: Yes    Birth control/protection: None  Other Topics Concern   Not on file  Social History Narrative   Right handed   Caffeine-1 cup daily   Social Drivers of Health   Financial Resource Strain: Low Risk  (08/24/2023)   Overall Financial Resource Strain (CARDIA)    Difficulty of Paying Living Expenses: Not hard at all  Food Insecurity: No Food Insecurity (05/14/2024)   Hunger Vital Sign    Worried About Running Out of Food in the Last Year: Never true    Ran Out of Food in the Last Year: Never true  Transportation Needs: No Transportation Needs (05/14/2024)   PRAPARE - Administrator, Civil Service (Medical): No    Lack of Transportation (Non-Medical): No  Physical Activity: Insufficiently Active (08/24/2023)   Exercise Vital Sign    Days of Exercise per Week: 1 day    Minutes of Exercise per Session: 10 min  Stress: Stress Concern Present (08/24/2023)   [employer] of Occupational Health - Occupational Stress  Questionnaire    Feeling of Stress : To some extent  Social Connections: Moderately Isolated (08/24/2023)   Social Connection and Isolation Panel    Frequency of Communication with Friends and Family: Once a week    Frequency of Social Gatherings with Friends and Family: Once a week    Attends Religious Services: 1 to 4 times per year    Active Member of [employer] or Organizations: No    Attends Banker Meetings: Never    Marital Status: Married  Catering manager Violence: Not At Risk (05/14/2024)   Humiliation, Afraid, Rape, and Kick questionnaire  Fear of Current or Ex-Partner: No    Emotionally Abused: No    Physically Abused: No    Sexually Abused: No      Review of Systems  Constitutional:  Negative for chills, fatigue and unexpected weight change.  HENT:  Negative for congestion, rhinorrhea, sneezing and sore throat.   Eyes:  Negative for redness.  Respiratory:  Negative for cough, chest tightness and shortness of breath.   Cardiovascular:  Negative for chest pain and palpitations.  Gastrointestinal:  Negative for abdominal pain and vomiting.  Genitourinary:  Negative for dysuria and frequency.  Musculoskeletal:  Positive for arthralgias and back pain. Negative for gait problem, joint swelling and neck pain.  Skin:  Negative for rash.  Neurological:  Positive for weakness. Negative for tremors.  Hematological:  Negative for adenopathy. Does not bruise/bleed easily.  Psychiatric/Behavioral:  Negative for suicidal ideas. Behavioral problem: Depression.The patient is nervous/anxious.     Vital Signs: BP (!) 154/93   Pulse 75   Temp 98 F (36.7 C)   Resp 16   Ht 5' 5 (1.651 m)   Wt 167 lb (75.8 kg)   SpO2 98%   BMI 27.79 kg/m    Physical Exam Vitals and nursing note reviewed.  Constitutional:      Appearance: Normal appearance.  HENT:     Head: Normocephalic and atraumatic.  Eyes:     Extraocular Movements: Extraocular movements intact.   Cardiovascular:     Rate and Rhythm: Normal rate and regular rhythm.  Pulmonary:     Effort: Pulmonary effort is normal.     Breath sounds: Normal breath sounds. No wheezing.  Musculoskeletal:     Right lower leg: No edema.     Left lower leg: No edema.  Skin:    General: Skin is warm and dry.  Neurological:     General: No focal deficit present.     Mental Status: She is alert.  Psychiatric:        Mood and Affect: Mood normal.        Behavior: Behavior normal.        Assessment/Plan: 1. Primary hypertension (Primary) Elevated in office, but controlled at home. Will bring home cuff and log next visit to compare in office. May be elevated due to pipe issue  2. Anxiety disorder due to medical condition Improving on increased lexapro  and will continue at current dose   General Counseling: landen breeland understanding of the findings of todays visit and agrees with plan of treatment. I have discussed any further diagnostic evaluation that may be needed or ordered today. We also reviewed her medications today. she has been encouraged to call the office with any questions or concerns that should arise related to todays visit.    No orders of the defined types were placed in this encounter.   No orders of the defined types were placed in this encounter.   This patient was seen by Tinnie Pro, PA-C in collaboration with Dr. Sigrid Bathe as a part of collaborative care agreement.   Total time spent:30 Minutes Time spent includes review of chart, medications, test results, and follow up plan with the patient.      Dr Fozia M Khan Internal medicine

## 2024-07-16 NOTE — Therapy (Signed)
 [facility] Rehabilitation at [facility] [address] [place], [place], [address] Phone: [phone]   Fax:  [phone]  Patient Details  Name: Jamie Williams MRN: [id number] Date of Birth: 07-01-1961 Referring Provider:  Kristina Tinnie POUR, PA*  Encounter Date: 07/16/2024  Pt. arrived for the OT treatment session following PT this afternoon. Pt. presented with increased coughing. Pt. was unable to engage in and participate in the session due to the discomfort from the increased coughing. Pt. reports that she did not bring her medication with her to help with her coughing. The OT session was discontinued this afternoon.     Richardson Otter, MS, OTR/L 07/16/2024, 2:33 PM  [facility] Outpatient Rehabilitation at [facility] [address] [place], [place], [address] Phone: [phone]   Fax:  [phone]

## 2024-07-16 NOTE — Therapy (Unsigned)
 OUTPATIENT PHYSICAL THERAPY NEURO treatment.    Patient Name: Jamie Williams MRN: [id number] DOB:1961-07-19, 63 y.o., female Today's Date: 07/16/2024   PCP: Tinnie Pro, PA-C REFERRING PROVIDER: Tinnie Pro, PA-C  END OF SESSION:  PT End of Session - 07/16/24 1312     Visit Number 4    Number of Visits 24    Date for Recertification  09/19/24    Progress Note Due on Visit 10    PT Start Time 1317    PT Stop Time 1357    PT Time Calculation (min) 40 min    Equipment Utilized During Treatment Gait belt    Activity Tolerance Patient tolerated treatment well    Behavior During Therapy WFL for tasks assessed/performed          Past Medical History:  Diagnosis Date   Anxiety    Family history of adverse reaction to anesthesia    sister had a difficult time waking up from mastectomy and passed away    GERD (gastroesophageal reflux disease)    History of chicken pox    History of kidney stones    Hyperlipidemia    Hypertension    PONV (postoperative nausea and vomiting)    nausea   Sciatic nerve pain    Past Surgical History:  Procedure Laterality Date   ANTERIOR CERVICAL DECOMP/DISCECTOMY FUSION N/A 12/24/2015   Procedure: Cervical five-six, Cervical six-seven  Anterior cervical decompression/diskectomy/fusion/interbody prosthesis/plate;  Surgeon: Reyes Budge, MD;  Location: [facility] NEURO ORS;  Service: Neurosurgery;  Laterality: N/A;   COLONOSCOPY WITH PROPOFOL  N/A 07/17/2018   Procedure: COLONOSCOPY WITH PROPOFOL ;  Surgeon: Therisa Bi, MD;  Location: [facility] ENDOSCOPY;  Service: Gastroenterology;  Laterality: N/A;   ECTOPIC PREGNANCY SURGERY  1991 and 1996   two Etopic preg.   Head Injuries  2006   surgery 2006 [place] in [place]. Headaches with increased intracranial pressure. Repeat MRI in 2008 Negative   SUPRACERVICAL ABDOMINAL HYSTERECTOMY  2011   Abdominal; Ovaries intact; CERVIX INTACT. Fibroids/dys menorrhea. [place]   Ulnar Neuropathy Right 2004    Ulnar Neuropathy surgical revision. 2004-2008. [place].   Patient Active Problem List   Diagnosis Date Noted   Acute pancreatitis 05/14/2024   GERD without esophagitis 05/14/2024   Anxiety and depression 05/14/2024   History of stroke 05/14/2024   Cerebrovascular accident (CVA) (HCC) 03/27/2024   Essential hypertension 03/27/2024   Hypothyroidism 03/27/2024   Dyslipidemia 03/27/2024   Peripheral neuropathy 03/27/2024   Ambulatory dysfunction 03/27/2024   Hypothyroid 07/22/2020   Chronic, continuous use of opioids 04/15/2020   Pap smear abnormality of cervix/human papillomavirus (HPV) positive 08/06/2019   Aortic atherosclerosis (HCC) 10/23/2018   GERD (gastroesophageal reflux disease)    Hot flashes due to menopause 01/10/2017   Cervical spondylosis with radiculopathy 12/24/2015   Compression injury of cervical spinal nerve 10/07/2015   Left arm pain 08/29/2015   Neck pain 08/29/2015   Hematuria 07/03/2015   Allergic arthritis 06/27/2015   Arthritis 06/27/2015   Back ache 06/27/2015   Clinical depression 06/27/2015   H/O abnormal cervical Papanicolaou smear 06/27/2015   Elevated intracranial pressure 06/27/2015   Kidney stones 06/27/2015   Knee pain 06/27/2015   Abnormal mammogram 06/27/2015   Headache, migraine 06/27/2015   Neuropathy 06/27/2015   Hand paresthesia 06/27/2015   Pre-diabetes 06/27/2015   Vitamin D  deficiency 06/27/2015   Weight loss 06/27/2015   Adjustment reaction 06/27/2015   Hypercholesterolemia without hypertriglyceridemia 01/29/2010   Tobacco abuse 01/28/2010   Primary hypertension 09/16/2009   Insomnia  09/16/2009   Lesion of ulnar nerve 09/16/2009    ONSET DATE: 03/26/2024  REFERRING DIAG:  M70.101 (ICD-10-CM) - Other symptoms and signs involving the musculoskeletal system  I69.30 (ICD-10-CM) - Unspecified sequelae of cerebral infarction  Z86.73 (ICD-10-CM) - Personal history of transient ischemic attack (TIA), and cerebral infarction  without residual deficits    THERAPY DIAG:  Muscle weakness (generalized)  Other lack of coordination  Unsteadiness on feet  Rationale for Evaluation and Treatment: Rehabilitation  SUBJECTIVE:                                                                                                                                                                                             SUBJECTIVE STATEMENT:  07/16/2024 Pt states that she is doing okay this afternoon. Was seen by internal medicine this AM. Was noted to have slightly elevated BP. Roughly 150/90.   FROM EVAL: I feel some better. I just had a hard time in July. Right after I started PT I went to ED with stomach issues then back 5 days later on 05/14/2024- with pancreatitis- spent several days in hospital. Had a f/u with primary and received new order back for PT. I feel like I just don't have any energy On disability- 12-13 years  Pt accompanied by: self   PERTINENT HISTORY: PMH includes: HTN, Anxiety, Dyslipidemia, GERD, Peripheral Neuropathy, Hypothyroidism. Patient had initiated PT services on 04/19/24 was seen twice before going back to hospital and admitted with pancreatitis. She was originally being seen due to have a CVA on 03/26/2024  PAIN:  Are you having pain? Yes: NPRS scale: current 6/10 Pain location: Right shoulder  Pain description: achy Aggravating factors: any use of Right UE Relieving factors: rest, tylenol - helps some  PRECAUTIONS: Fall  RED FLAGS: None   WEIGHT BEARING RESTRICTIONS: No  FALLS: Has patient fallen in last 6 months? Yes. Number of falls 1 fall 3 months ago- sitting on sofa- was getting up and fell forward.   LIVING ENVIRONMENT: Lives with: lives with their family- Husband and son Lives in: House/apartment Stairs: Yes: External: 3 steps; can reach both Has following equipment at home: Single point cane, Walker - 4 wheeled, and shower chair  PLOF: Independent  PATIENT GOALS: I just  want to get better- use my hand and improve my balance.   OBJECTIVE:  Note: Objective measures were completed at Evaluation unless otherwise noted.  DIAGNOSTIC FINDINGS: CLINICAL DATA:  63 year old female with headache and pain. Status post left lateral thalamic/internal capsule lacunar infarcts last month. Epigastric pain radiating under the left breast with nausea.   EXAM: CT HEAD WITHOUT CONTRAST  TECHNIQUE: Contiguous axial images were obtained from the base of the skull through the vertex without intravenous contrast.   RADIATION DOSE REDUCTION: This exam was performed according to the departmental dose-optimization program which includes automated exposure control, adjustment of the mA and/or kV according to patient size and/or use of iterative reconstruction technique.   COMPARISON:  Brain MRI 03/26/2024. head CT 03/27/2024.   FINDINGS: Brain: Expected small foci of cystic encephalomalacia corresponding to the left deep gray and white matter lacunar infarcts on MRI last month. Elsewhere gray-white differentiation is stable.   Background cerebral volume remains normal for age. No midline shift, ventriculomegaly, mass effect, evidence of mass lesion, intracranial hemorrhage or evidence of cortically based acute infarction.   Vascular: No suspicious intracranial vascular hyperdensity. Calcified atherosclerosis at the skull base.   Skull: Previous suboccipital decompression. No acute osseous abnormality identified.   Sinuses/Orbits: Visualized paranasal sinuses and mastoids are stable and well aerated.   Other: No acute orbit or scalp soft tissue finding.   IMPRESSION: 1.  No acute intracranial abnormality. 2. Expected evolution of the Left deep gray and white matter lacunar infarcts on MRI last month. 3. Previous suboccipital decompression.     Electronically Signed   By: VEAR Hurst M.D.   On: 05/09/2024 03:58  COGNITION: Overall cognitive status: Within  functional limits for tasks assessed   SENSATION: R hand numbness and tingling   COORDINATION: Good heel to shin- bilateral  EDEMA:  None observed or reported   POSTURE: rounded shoulders, forward head, and increased thoracic kyphosis  LOWER EXTREMITY ROM:     Active  Right Eval Left Eval  Hip flexion    Hip extension    Hip abduction    Hip adduction    Hip internal rotation    Hip external rotation    Knee flexion    Knee extension    Ankle dorsiflexion    Ankle plantarflexion    Ankle inversion    Ankle eversion     (Blank rows = not tested)  LOWER EXTREMITY MMT:    MMT Right Eval Left Eval  Hip flexion 3+ 4+  Hip extension 4 4+  Hip abduction 4 4+  Hip adduction    Hip internal rotation    Hip external rotation    Knee flexion 4 5  Knee extension 4 5  Ankle dorsiflexion 4+ 5  Ankle plantarflexion    Ankle inversion    Ankle eversion    (Blank rows = not tested)  BED MOBILITY:  Not tested- Patient reports independent   TRANSFERS: Sit to stand: SBA  Assistive device utilized: None     Stand to sit: SBA  Assistive device utilized: None     Chair to chair: SBA  Assistive device utilized: None       RAMP:  Not tested  CURB:  Not tested  STAIRS: Not tested GAIT: Findings: Gait Characteristics: decreased arm swing- Right, decreased step length- Right, decreased step length- Left, and decreased stride length, Distance walked: approx 100 feet, and Level of assistance: SBA  FUNCTIONAL TESTS:  5 times sit to stand: 5 times sit to stand: 18.0 sec without UE Support Timed up and go (TUG): 9.78 sec  6 minute walk test: To be assessed next visit.  10 Meter walk test: 1.17  Berg Balance Scale:  Item Test date: 06/27/2024 Test date:  Test date:   Sitting to standing 4. able to stand without using hands and stabilize independently Insert OPRCBERGREEVAL SmartPhrase at re-test  date Insert OPRCBERGREEVAL SmartPhrase at re-test date  2. Standing unsupported  4. able to stand safely for 2 minutes    3. Sitting with back unsupported, feet supported 4. able to sit safely and securely for 2 minutes    4. Standing to sitting 3. controls descent by using hands    5. Pivot transfer  4. able to transfer safely with minor use of hands    6. Standing unsupported with eyes closed 4. able to stand 10 seconds safely    7. Standing unsupported with feet together 3. able to place feet together independently and stand 1 minute with supervision    8. Reaching forward with outstretched arms while standing 2. can reach forward 5 cm (2 inches)    9. Pick up object from the floor from standing 3. able to pick up slipper but needs supervision    10. Turning to look behind over left and right shoulders while standing 3. looks behind one side only, other side shows less weight shift    11. Turn 360 degrees 3. able to turn 360 degrees safely, one side only, in 4 seconds or less    12. Place alternate foot on step or stool while standing unsupported 4. ble to stand independently and safely and complete 8 steps in 20 seconds    13. Standing unsupported one foot in front 3. able to place foot ahead independently and hold 30 seconds    14. Standing on one leg 2. able to lift leg independently and hold >= 3 seconds      Total Score 46/56 Total Score /56 Total Score /56     PATIENT SURVEYS:  ABC scale: The Activities-Specific Balance Confidence (ABC) Scale-66.25%                                                                                                                             TREATMENT DATE:   Nustep rolling hill program levl 2-6 with cues to maintain consistent SPM through varied resistance >40 as tolerated.  -  Activity Description: 4 pods in square 6 feet apart.  Activity Setting:  random  Cycles/Sets: 2 Duration (Time or Hit Count):  1:68min Patient Stats  Hits:   26, 28 -  Activity Description: 4 pods; 1 on each step of stair  Activity Setting:  random   Cycles/Sets:  2 Duration (Time or Hit Count): 1:87min Patient Stats  Hits:   14,17   Weighted gait training with 3# AW 2x 526ft cues for full step length, which greatly improved after   Forward/reverse gait with 3# AW 74ft x 8. Improved step length with increased distance.   PT assessed BP following activity. Pt  due to reports of elevated BP earlier in the day at rest.  167/89 HR 89   PT provided CGA for safety throughout session with gait belt in place unless otherwise noted.       PATIENT EDUCATION: Education details: Exam details, PT  plan of care; Purpose of functional outcome measures, Education in HEP Person educated: Patient Education method: Explanation Education comprehension: verbalized understanding  HOME EXERCISE PROGRAM: Access Code: [email or web address] URL: [email or web address] Date: 07/02/2024 Prepared by: Massie Dollar  Exercises - Tandem Stance  - 1 x daily - 5 x weekly - 3 sets - 4 reps - 30 sec hold - Tandem Walking with Counter Support  - 1 x daily - 5 x weekly - 3 sets - 10 reps - Single Leg Stance with Support  - 1 x daily - 5 x weekly - 3 sets - 4 reps - 20 sec  hold - Sit to Stand with Arms Crossed  - 1 x daily - 5 x weekly - 3 sets - 10 reps - Backward Walking with Counter Support  - 1 x daily - 5 x weekly - 3 sets - 10 reps  GOALS: Goals reviewed with patient? Yes  SHORT TERM GOALS: Target date: 08/08/2024  Patient will be independent in home exercise program to improve strength/mobility for better functional independence with ADLs. Baseline: EVAL - No formal HEP in place- edcuated in Tandem and SLS today and added to HEP.  Goal status: INITIAL   LONG TERM GOALS: Target date: 09/19/2024  1.  Patient will complete five times sit to stand test in < 15 seconds indicating an increased LE strength and improved balance. Baseline: EVAL- 18.0 sec without UE Support Goal status: INITIAL  2.  Patient will improve ABC score by 9 points    to  demonstrate statistically significant improvement in mobility and quality of life as it relates to their confidence in her balance.  Baseline: 66.25% Goal status: INITIAL   3.  Patient will increase Berg Balance score by > 6 points to demonstrate decreased fall risk during functional activities. Baseline: EVAL= 46/56 Goal status: INITIAL  4.   Patient will increase six minute walk test distance by >179ft for progression to community ambulator and improve gait ability Baseline: 1,122 Goal status: INITIAL  ASSESSMENT:  CLINICAL IMPRESSION: Patient is a 63 y.o. female who was seen today for physical therapy  treatment for cerebral infarction. The Blaze Pod Random setting was chosen to enhance cognitive processing and agility, providing an unpredictable environment to simulate real-world scenarios, and fostering quick reactions and adaptability. Tolerated extended gait training well today. Will benefit from community mobility training and transition to community/ home fitness program. Pt will benefit from further skilled PT interventions to improve her mobility, strength, and balance  in order to increase ease and safety with mobility and ADLs and reduce fall risk.   OBJECTIVE IMPAIRMENTS: Abnormal gait, decreased activity tolerance, decreased balance, decreased coordination, decreased endurance, decreased mobility, difficulty walking, and decreased strength.   ACTIVITY LIMITATIONS: carrying, lifting, bending, sitting, standing, squatting, sleeping, stairs, and transfers  PARTICIPATION LIMITATIONS: meal prep, cleaning, laundry, driving, shopping, community activity, and yard work  PERSONAL FACTORS: 3+ comorbidities: knee pain, spinal pain, depression are also affecting patient's functional outcome.   REHAB POTENTIAL: Good  CLINICAL DECISION MAKING: Evolving/moderate complexity  EVALUATION COMPLEXITY: Moderate  PLAN:  PT FREQUENCY: 1-2x/week  PT DURATION: 12 weeks  PLANNED  INTERVENTIONS: 97164- PT Re-evaluation, 97750- Physical Performance Testing, 97110-Therapeutic exercises, 97530- Therapeutic activity, [id number]- Neuromuscular re-education, 97535- Self Care, 02859- Manual therapy, [id number]- Gait training, [id number]- Orthotic Initial, [id number]- Orthotic/Prosthetic subsequent, [phone]- Canalith repositioning, [id number]- Electrical stimulation (manual), [id number]- Ultrasound, 79439 (1-2 muscles), 20561 (3+ muscles)- Dry Needling, Patient/Family education, Balance training, Stair training, Taping, Joint mobilization, Joint manipulation, Spinal manipulation, Spinal  mobilization, Vestibular training, DME instructions, Cryotherapy, and Moist heat  PLAN FOR NEXT SESSION:   High level balance training with obstacle course or Blaze pods.  Outdoor mobility.    Massie FORBES Dollar, PT 07/16/2024, 1:13 PM

## 2024-07-18 ENCOUNTER — Ambulatory Visit: Admitting: Physical Therapy

## 2024-07-23 ENCOUNTER — Ambulatory Visit: Admitting: Physical Therapy

## 2024-07-23 ENCOUNTER — Ambulatory Visit

## 2024-07-25 ENCOUNTER — Ambulatory Visit: Admitting: Occupational Therapy

## 2024-07-26 ENCOUNTER — Other Ambulatory Visit: Payer: Self-pay | Admitting: Physician Assistant

## 2024-07-26 DIAGNOSIS — I10 Essential (primary) hypertension: Secondary | ICD-10-CM

## 2024-07-26 NOTE — Telephone Encounter (Signed)
 Please review ok to send

## 2024-07-30 ENCOUNTER — Ambulatory Visit: Attending: Nurse Practitioner | Admitting: Physical Therapy

## 2024-07-30 ENCOUNTER — Ambulatory Visit: Admitting: Occupational Therapy

## 2024-07-30 DIAGNOSIS — M6281 Muscle weakness (generalized): Secondary | ICD-10-CM

## 2024-07-30 DIAGNOSIS — R2681 Unsteadiness on feet: Secondary | ICD-10-CM | POA: Insufficient documentation

## 2024-07-30 DIAGNOSIS — R278 Other lack of coordination: Secondary | ICD-10-CM | POA: Insufficient documentation

## 2024-07-30 NOTE — Therapy (Signed)
 OUTPATIENT PHYSICAL THERAPY NEURO treatment.    Patient Name: Jamie Williams MRN: [id number] DOB:08-09-1961, 63 y.o., female Today's Date: 07/30/2024   PCP: Tinnie Pro, PA-C REFERRING PROVIDER: Tinnie Pro, PA-C  END OF SESSION:  PT End of Session - 07/30/24 1327     Visit Number 5    Number of Visits 24    Date for Recertification  09/19/24    Progress Note Due on Visit 10    PT Start Time 1319    PT Stop Time 1400    PT Time Calculation (min) 41 min    Equipment Utilized During Treatment Gait belt    Activity Tolerance Patient tolerated treatment well    Behavior During Therapy WFL for tasks assessed/performed           Past Medical History:  Diagnosis Date   Anxiety    Family history of adverse reaction to anesthesia    sister had a difficult time waking up from mastectomy and passed away    GERD (gastroesophageal reflux disease)    History of chicken pox    History of kidney stones    Hyperlipidemia    Hypertension    PONV (postoperative nausea and vomiting)    nausea   Sciatic nerve pain    Past Surgical History:  Procedure Laterality Date   ANTERIOR CERVICAL DECOMP/DISCECTOMY FUSION N/A 12/24/2015   Procedure: Cervical five-six, Cervical six-seven  Anterior cervical decompression/diskectomy/fusion/interbody prosthesis/plate;  Surgeon: Reyes Budge, MD;  Location: [facility];  Service: Neurosurgery;  Laterality: N/A;   COLONOSCOPY WITH PROPOFOL  N/A 07/17/2018   Procedure: COLONOSCOPY WITH PROPOFOL ;  Surgeon: Therisa Bi, MD;  Location: [facility];  Service: Gastroenterology;  Laterality: N/A;   ECTOPIC PREGNANCY SURGERY  1991 and 1996   two Etopic preg.   Head Injuries  2006   surgery 2006 Cram/NS in [place]. Headaches with increased intracranial pressure. Repeat MRI in 2008 Negative   SUPRACERVICAL ABDOMINAL HYSTERECTOMY  2011   Abdominal; Ovaries intact; CERVIX INTACT. Fibroids/dys menorrhea. [place]   Ulnar Neuropathy Right  2004   Ulnar Neuropathy surgical revision. 2004-2008. [place].   Patient Active Problem List   Diagnosis Date Noted   Acute pancreatitis 05/14/2024   GERD without esophagitis 05/14/2024   Anxiety and depression 05/14/2024   History of stroke 05/14/2024   Cerebrovascular accident (CVA) (HCC) 03/27/2024   Essential hypertension 03/27/2024   Hypothyroidism 03/27/2024   Dyslipidemia 03/27/2024   Peripheral neuropathy 03/27/2024   Ambulatory dysfunction 03/27/2024   Hypothyroid 07/22/2020   Chronic, continuous use of opioids 04/15/2020   Pap smear abnormality of cervix/human papillomavirus (HPV) positive 08/06/2019   Aortic atherosclerosis 10/23/2018   GERD (gastroesophageal reflux disease)    Hot flashes due to menopause 01/10/2017   Cervical spondylosis with radiculopathy 12/24/2015   Compression injury of cervical spinal nerve 10/07/2015   Left arm pain 08/29/2015   Neck pain 08/29/2015   Hematuria 07/03/2015   Allergic arthritis 06/27/2015   Arthritis 06/27/2015   Back ache 06/27/2015   Clinical depression 06/27/2015   H/O abnormal cervical Papanicolaou smear 06/27/2015   Elevated intracranial pressure 06/27/2015   Kidney stones 06/27/2015   Knee pain 06/27/2015   Abnormal mammogram 06/27/2015   Headache, migraine 06/27/2015   Neuropathy 06/27/2015   Hand paresthesia 06/27/2015   Pre-diabetes 06/27/2015   Vitamin D  deficiency 06/27/2015   Weight loss 06/27/2015   Adjustment reaction 06/27/2015   Hypercholesterolemia without hypertriglyceridemia 01/29/2010   Tobacco abuse 01/28/2010   Primary hypertension 09/16/2009   Insomnia  09/16/2009   Lesion of ulnar nerve 09/16/2009    ONSET DATE: 03/26/2024  REFERRING DIAG:  M70.101 (ICD-10-CM) - Other symptoms and signs involving the musculoskeletal system  I69.30 (ICD-10-CM) - Unspecified sequelae of cerebral infarction  Z86.73 (ICD-10-CM) - Personal history of transient ischemic attack (TIA), and cerebral infarction  without residual deficits    THERAPY DIAG:  Muscle weakness (generalized)  Other lack of coordination  Unsteadiness on feet  Rationale for Evaluation and Treatment: Rehabilitation  SUBJECTIVE:                                                                                                                                                                                             SUBJECTIVE STATEMENT:  07/30/2024   Pt states that she is doing okay this afternoon.  Has a little discomfort in the R shoulder, but took some tylenol  prior to PT, and reports that it is doing better.  Had a rough weekend with medication induced cough. Did not do much over the weekend.   Pt states that she is feeling much more confident with mobility. But still not 100%. Also reports that she will occasionally have a little dizziness when standing up or when looking up in cabinet. Otherwise. No other reports issues with mobility, except standing on one leg.   FROM EVAL: I feel some better. I just had a hard time in July. Right after I started PT I went to ED with stomach issues then back 5 days later on 05/14/2024- with pancreatitis- spent several days in hospital. Had a f/u with primary and received new order back for PT. I feel like I just don't have any energy On disability- 12-13 years  Pt accompanied by: self   PERTINENT HISTORY: PMH includes: HTN, Anxiety, Dyslipidemia, GERD, Peripheral Neuropathy, Hypothyroidism. Patient had initiated PT services on 04/19/24 was seen twice before going back to hospital and admitted with pancreatitis. She was originally being seen due to have a CVA on 03/26/2024  PAIN:  Are you having pain? Yes: NPRS scale: current 6/10 Pain location: Right shoulder  Pain description: achy Aggravating factors: any use of Right UE Relieving factors: rest, tylenol - helps some  PRECAUTIONS: Fall  RED FLAGS: None   WEIGHT BEARING RESTRICTIONS: No  FALLS: Has patient fallen in last 6  months? Yes. Number of falls 1 fall 3 months ago- sitting on sofa- was getting up and fell forward.   LIVING ENVIRONMENT: Lives with: lives with their family- Husband and son Lives in: House/apartment Stairs: Yes: External: 3 steps; can reach both Has following equipment at home: Single point cane, Walker - 4 wheeled, and  shower chair  PLOF: Independent  PATIENT GOALS: I just want to get better- use my hand and improve my balance.   OBJECTIVE:  Note: Objective measures were completed at Evaluation unless otherwise noted.  DIAGNOSTIC FINDINGS: CLINICAL DATA:  63 year old female with headache and pain. Status post left lateral thalamic/internal capsule lacunar infarcts last month. Epigastric pain radiating under the left breast with nausea.   EXAM: CT HEAD WITHOUT CONTRAST   TECHNIQUE: Contiguous axial images were obtained from the base of the skull through the vertex without intravenous contrast.   RADIATION DOSE REDUCTION: This exam was performed according to the departmental dose-optimization program which includes automated exposure control, adjustment of the mA and/or kV according to patient size and/or use of iterative reconstruction technique.   COMPARISON:  Brain MRI 03/26/2024. head CT 03/27/2024.   FINDINGS: Brain: Expected small foci of cystic encephalomalacia corresponding to the left deep gray and white matter lacunar infarcts on MRI last month. Elsewhere gray-white differentiation is stable.   Background cerebral volume remains normal for age. No midline shift, ventriculomegaly, mass effect, evidence of mass lesion, intracranial hemorrhage or evidence of cortically based acute infarction.   Vascular: No suspicious intracranial vascular hyperdensity. Calcified atherosclerosis at the skull base.   Skull: Previous suboccipital decompression. No acute osseous abnormality identified.   Sinuses/Orbits: Visualized paranasal sinuses and mastoids are stable and  well aerated.   Other: No acute orbit or scalp soft tissue finding.   IMPRESSION: 1.  No acute intracranial abnormality. 2. Expected evolution of the Left deep gray and white matter lacunar infarcts on MRI last month. 3. Previous suboccipital decompression.     Electronically Signed   By: VEAR Hurst M.D.   On: 05/09/2024 03:58  COGNITION: Overall cognitive status: Within functional limits for tasks assessed   SENSATION: R hand numbness and tingling   COORDINATION: Good heel to shin- bilateral  EDEMA:  None observed or reported   POSTURE: rounded shoulders, forward head, and increased thoracic kyphosis  LOWER EXTREMITY ROM:     Active  Right Eval Left Eval  Hip flexion    Hip extension    Hip abduction    Hip adduction    Hip internal rotation    Hip external rotation    Knee flexion    Knee extension    Ankle dorsiflexion    Ankle plantarflexion    Ankle inversion    Ankle eversion     (Blank rows = not tested)  LOWER EXTREMITY MMT:    MMT Right Eval Left Eval  Hip flexion 3+ 4+  Hip extension 4 4+  Hip abduction 4 4+  Hip adduction    Hip internal rotation    Hip external rotation    Knee flexion 4 5  Knee extension 4 5  Ankle dorsiflexion 4+ 5  Ankle plantarflexion    Ankle inversion    Ankle eversion    (Blank rows = not tested)  BED MOBILITY:  Not tested- Patient reports independent   TRANSFERS: Sit to stand: SBA  Assistive device utilized: None     Stand to sit: SBA  Assistive device utilized: None     Chair to chair: SBA  Assistive device utilized: None       RAMP:  Not tested  CURB:  Not tested  STAIRS: Not tested GAIT: Findings: Gait Characteristics: decreased arm swing- Right, decreased step length- Right, decreased step length- Left, and decreased stride length, Distance walked: approx 100 feet, and Level of assistance: SBA  FUNCTIONAL TESTS:  5 times sit to stand: 5 times sit to stand: 18.0 sec without UE Support Timed  up and go (TUG): 9.78 sec  6 minute walk test: To be assessed next visit.  10 Meter walk test: 1.17  Berg Balance Scale:  Item Test date: 06/27/2024 Test date:  Test date:   Sitting to standing 4. able to stand without using hands and stabilize independently Insert OPRCBERGREEVAL SmartPhrase at re-test date Insert OPRCBERGREEVAL SmartPhrase at re-test date  2. Standing unsupported 4. able to stand safely for 2 minutes    3. Sitting with back unsupported, feet supported 4. able to sit safely and securely for 2 minutes    4. Standing to sitting 3. controls descent by using hands    5. Pivot transfer  4. able to transfer safely with minor use of hands    6. Standing unsupported with eyes closed 4. able to stand 10 seconds safely    7. Standing unsupported with feet together 3. able to place feet together independently and stand 1 minute with supervision    8. Reaching forward with outstretched arms while standing 2. can reach forward 5 cm (2 inches)    9. Pick up object from the floor from standing 3. able to pick up slipper but needs supervision    10. Turning to look behind over left and right shoulders while standing 3. looks behind one side only, other side shows less weight shift    11. Turn 360 degrees 3. able to turn 360 degrees safely, one side only, in 4 seconds or less    12. Place alternate foot on step or stool while standing unsupported 4. ble to stand independently and safely and complete 8 steps in 20 seconds    13. Standing unsupported one foot in front 3. able to place foot ahead independently and hold 30 seconds    14. Standing on one leg 2. able to lift leg independently and hold >= 3 seconds      Total Score 46/56 Total Score /56 Total Score /56     PATIENT SURVEYS:  ABC scale: The Activities-Specific Balance Confidence (ABC) Scale-66.25%                                                                                                                             TREATMENT DATE:    Nustep rolling hill program levl 2-6 with cues to maintain consistent SPM through varied resistance >40 as tolerated. Performed x 6 min.   Stair ascent/descent without UE support with reciprocal pattern   Standing on 1 foot with lateral pressure for glute med activation x 45 sec bil  Light Ue support for modified single leg good morning x 5 bil  Lateral lunge step to touch target on the ground. X 8 bil   Single limb step up to second step of stairs with light UE support. X 6 bil  Single limb Lateral step up second step of  stair with light UE support x 6 bil   Lateral step across 4 inch aerobic step x 15 bil  Reciprocal foot tap on 4inch step x 20 bil.  No UE support required.   Sit<>stand with overhead reach and rise into heel raise. Attempted jump into standing, but unable to clear BLE.   Standing hop at rail x 10 with heavy BUE support       PT provided supervision assist for safety throughout session with gait belt in place unless otherwise noted.       PATIENT EDUCATION: Education details: Exam details, PT plan of care; Purpose of functional outcome measures, Education in HEP Person educated: Patient Education method: Explanation Education comprehension: verbalized understanding  HOME EXERCISE PROGRAM: Access Code: [email or web address] URL: [email or web address] Date: 07/30/2024 Prepared by: Massie Dollar  Exercises - Tandem Stance  - 1 x daily - 5 x weekly - 3 sets - 4 reps - 30 sec hold - Tandem Walking with Counter Support  - 1 x daily - 5 x weekly - 3 sets - 10 reps - Single Leg Stance with Support  - 1 x daily - 5 x weekly - 3 sets - 4 reps - 20 sec  hold - Sit to Stand with Arms Crossed  - 1 x daily - 5 x weekly - 3 sets - 10 reps - Backward Walking with Counter Support  - 1 x daily - 5 x weekly - 3 sets - 10 reps - Side Lunge with Counter Support  - 1 x daily - 7 x weekly - 3 sets - 10 reps - Lunge with Counter Support  - 1 x daily - 7 x weekly - 3 sets -  10 reps  GOALS: Goals reviewed with patient? Yes  SHORT TERM GOALS: Target date: 08/08/2024  Patient will be independent in home exercise program to improve strength/mobility for better functional independence with ADLs. Baseline: EVAL - No formal HEP in place- edcuated in Tandem and SLS today and added to HEP.  Goal status: INITIAL   LONG TERM GOALS: Target date: 09/19/2024  1.  Patient will complete five times sit to stand test in < 15 seconds indicating an increased LE strength and improved balance. Baseline: EVAL- 18.0 sec without UE Support Goal status: INITIAL  2.  Patient will improve ABC score by 9 points    to demonstrate statistically significant improvement in mobility and quality of life as it relates to their confidence in her balance.  Baseline: 66.25% Goal status: INITIAL   3.  Patient will increase Berg Balance score by > 6 points to demonstrate decreased fall risk during functional activities. Baseline: EVAL= 46/56 Goal status: INITIAL  4.   Patient will increase six minute walk test distance by >154ft for progression to community ambulator and improve gait ability Baseline: 1,122 Goal status: INITIAL  ASSESSMENT:  CLINICAL IMPRESSION: Patient is a 63 y.o. female who was seen today for physical therapy  treatment for cerebral infarction. PT treatment focused on SLS stance tasks as well as improve explosive movements including hop with UE support. Mild UE support required for step up to second steps but intermittently was able to perform without UE support. HEP expanded as listed. Pt will benefit from further skilled PT interventions to improve her mobility, strength, and balance  in order to increase ease and safety with mobility and ADLs and reduce fall risk.   OBJECTIVE IMPAIRMENTS: Abnormal gait, decreased activity tolerance, decreased balance, decreased coordination, decreased endurance, decreased mobility, difficulty walking,  and decreased strength.    ACTIVITY LIMITATIONS: carrying, lifting, bending, sitting, standing, squatting, sleeping, stairs, and transfers  PARTICIPATION LIMITATIONS: meal prep, cleaning, laundry, driving, shopping, community activity, and yard work  PERSONAL FACTORS: 3+ comorbidities: knee pain, spinal pain, depression are also affecting patient's functional outcome.   REHAB POTENTIAL: Good  CLINICAL DECISION MAKING: Evolving/moderate complexity  EVALUATION COMPLEXITY: Moderate  PLAN:  PT FREQUENCY: 1-2x/week  PT DURATION: 12 weeks  PLANNED INTERVENTIONS: 97164- PT Re-evaluation, 97750- Physical Performance Testing, 97110-Therapeutic exercises, 97530- Therapeutic activity, [id number]- Neuromuscular re-education, 97535- Self Care, [id number]- Manual therapy, [id number]- Gait training, [id number]- Orthotic Initial, [id number]- Orthotic/Prosthetic subsequent, [phone]- Canalith repositioning, [id number]- Electrical stimulation (manual), [id number]- Ultrasound, 79439 (1-2 muscles), [id number] (3+ muscles)- Dry Needling, Patient/Family education, Balance training, Stair training, Taping, Joint mobilization, Joint manipulation, Spinal manipulation, Spinal mobilization, Vestibular training, DME instructions, Cryotherapy, and Moist heat  PLAN FOR NEXT SESSION:   High level balance training with obstacle course or Blaze pods.  Outdoor mobility.    Massie FORBES Dollar, PT 07/30/2024, 1:28 PM

## 2024-07-30 NOTE — Therapy (Signed)
 OUTPATIENT OCCUPATIONAL THERAPY NEURO TREATMENT NOTE  Patient Name: Jamie Williams MRN: [id number] DOB:[birth year], 63 y.o., female Today's Date: 07/30/2024  PCP: Kristina Tinnie POUR, PA-C REFERRING PROVIDER: Liana Fish, NP  END OF SESSION:  OT End of Session - 07/30/24 1418     Visit Number 5    Number of Visits 24    Date for Recertification  09/19/24    OT Start Time 1405    OT Stop Time 1445    OT Time Calculation (min) 40 min    Activity Tolerance Patient tolerated treatment well    Behavior During Therapy WFL for tasks assessed/performed              Past Medical History:  Diagnosis Date   Anxiety    Family history of adverse reaction to anesthesia    sister had a difficult time waking up from mastectomy and passed away    GERD (gastroesophageal reflux disease)    History of chicken pox    History of kidney stones    Hyperlipidemia    Hypertension    PONV (postoperative nausea and vomiting)    nausea   Sciatic nerve pain    Past Surgical History:  Procedure Laterality Date   ANTERIOR CERVICAL DECOMP/DISCECTOMY FUSION N/A 12/24/2015   Procedure: Cervical five-six, Cervical six-seven  Anterior cervical decompression/diskectomy/fusion/interbody prosthesis/plate;  Surgeon: Reyes Budge, MD;  Location: [facility] NEURO [facility]S;  Service: Neurosurgery;  Laterality: N/A;   COLONOSCOPY WITH PROPOFOL  N/A 07/17/2018   Procedure: COLONOSCOPY WITH PROPOFOL ;  Surgeon: Therisa Bi, MD;  Location: [facility] ENDOSCOPY;  Service: Gastroenterology;  Laterality: N/A;   ECTOPIC PREGNANCY SURGERY  1991 and 1996   two Etopic preg.   Head Injuries  2006   surgery 2006 Cram/NS in [place]. Headaches with increased intracranial pressure. Repeat MRI in 2008 Negative   SUPRACERVICAL ABDOMINAL HYSTERECTOMY  2011   Abdominal; Ovaries intact; CERVIX INTACT. Fibroids/dys menorrhea. [place]   Ulnar Neuropathy Right 2004   Ulnar Neuropathy surgical revision. 2004-2008. [place].    Patient Active Problem List   Diagnosis Date Noted   Acute pancreatitis 05/14/2024   GERD without esophagitis 05/14/2024   Anxiety and depression 05/14/2024   History of stroke 05/14/2024   Cerebrovascular accident (CVA) (HCC) 03/27/2024   Essential hypertension 03/27/2024   Hypothyroidism 03/27/2024   Dyslipidemia 03/27/2024   Peripheral neuropathy 03/27/2024   Ambulatory dysfunction 03/27/2024   Hypothyroid 07/22/2020   Chronic, continuous use of opioids 04/15/2020   Pap smear abnormality of cervix/human papillomavirus (HPV) positive 08/06/2019   Aortic atherosclerosis 10/23/2018   GERD (gastroesophageal reflux disease)    Hot flashes due to menopause 01/10/2017   Cervical spondylosis with radiculopathy 12/24/2015   Compression injury of cervical spinal nerve 10/07/2015   Left arm pain 08/29/2015   Neck pain 08/29/2015   Hematuria 07/03/2015   Allergic arthritis 06/27/2015   Arthritis 06/27/2015   Back ache 06/27/2015   Clinical depression 06/27/2015   H/O abnormal cervical Papanicolaou smear 06/27/2015   Elevated intracranial pressure 06/27/2015   Kidney stones 06/27/2015   Knee pain 06/27/2015   Abnormal mammogram 06/27/2015   Headache, migraine 06/27/2015   Neuropathy 06/27/2015   Hand paresthesia 06/27/2015   Pre-diabetes 06/27/2015   Vitamin D  deficiency 06/27/2015   Weight loss 06/27/2015   Adjustment reaction 06/27/2015   Hypercholesterolemia without hypertriglyceridemia 01/29/2010   Tobacco abuse 01/28/2010   Primary hypertension 09/16/2009   Insomnia 09/16/2009   Lesion of ulnar nerve 09/16/2009    ONSET DATE: 03/26/24  REFERRING  DIAG: CVA  THERAPY DIAG:  Weakness, lack of coordination  Rationale for Evaluation and Treatment: Rehabilitation  SUBJECTIVE:   SUBJECTIVE STATEMENT:  Pt. reports feeling much better than the last time she was here. Pt accompanied by: self  PERTINENT HISTORY:   Addendum: Pt. was previously evaluated by OT services  2/2 a CVA as per the pertinent history below. Pt., however, was hospitalized from 7/21/-05/16/24 for Pancreatitis and abdominal pain shortly after being evaluated.    Pt. is a 63 y.o. female who was admitted to the hospital with an acute onset of back pain, trouble walking RUE and LE weakness. Imaging revealed an acute ischemic vertebrobasilar artery thalamic stroke, and Degenerative Changes of the Lumbar spine  L5-S1. PHMx includes: Peripheral neuropathy, Essential HTN, Hypothyroidism, Dyslipidemia, Ambulatory Dysfunction. Pt. has a history of neck, and right shoulder pain with a planned MRI in August 2025. Pt. Has a History of remote right elbow injury from working in the [employer].   PRECAUTIONS: None  WEIGHT BEARING RESTRICTIONS: No  PAIN:   07/30/24: No pain reported 07/09/24: 8/10 pain in the right shoulder  07/02/24: 6/10 pain in the shoulder, and back Eval: Are you having pain? Right  shoulder 7/10 pain. Has 7/10 Sciatica; sometimes reaches a 10/10 at home; arthritis in the spine  FALLS: Has patient fallen in last 6 months? 1 fall getting up from the couch.  LIVING ENVIRONMENT: Lives with: lives with their spouse and lives with their son Lives in: House/apartment Stairs:  3 steps to enter; one level home Has following equipment at home: Counselling psychologist, BSCommode, rollator  PLOF: Independent  PATIENT GOALS: To get back to her PLOF  OBJECTIVE:  Note: Objective measures were completed at Evaluation unless otherwise noted.  HAND DOMINANCE: Right  ADLs:  Transfers/ambulation related to ADLs: Independent Eating: Independent, increased time required, loses grip on a glass, difficulty cutting food Grooming: Independent UB Dressing: Independent, some difficulty with buttoning LB Dressing: independent Toileting: Independent Bathing: Independent  Tub Shower transfers: Independent; distance supervision -showers when family is home   IADLs: Shopping: Sister has been doing the  grocery shopping Light housekeeping: Writer,  has not washed dishes, independent with bedmaking increased time  2/2 decreased balance Meal Prep: Independent Community mobility: Drives a little bit Medication management: Has difficulty using the right hand to open medication bottles Financial management: No changes in the process Handwriting: 75% legibility for name only Typing/cellphone use: Difficulty using then right to manage the cellphone. 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 Hobbies: Crafts  MOBILITY STATUS: Independent without an assistive device    ACTIVITY TOLERANCE: Activity tolerance: WFL  FUNCTIONAL OUTCOME MEASURES: Eval: MAM Measure Sum Score: 69 Re-eval: MAM Measure Score: 63  UPPER EXTREMITY ROM:    Active ROM Right Eval 04/19/24 Right Re-Eval 06/27/24 Left Eval 04/19/24 Left: Re-eval 06/27/24   Shoulder flexion 92(103) 111(126) WFL WFL  Shoulder abduction 82(90) 122(127) [facility] WFL  Shoulder adduction      Shoulder extension      Shoulder internal rotation      Shoulder external rotation      Elbow flexion [facility] WFL WFL  Elbow extension [facility] WFL  Wrist flexion [facility] WFL WFL  Wrist extension [facility] WFL WFL  Wrist ulnar deviation      Wrist radial deviation      Wrist pronation      Wrist supination      (Blank rows = not tested)  UPPER EXTREMITY MMT:     MMT Right Eval 04/19/24 Right Re-eval 06/27/24 Left Eval 04/19/24 Right  Re-eval 06/27/24  Shoulder flexion 3-/5 3-/5 5/5 4+/5  Shoulder abduction 3-/5 3/5 5/5 4+/5  Shoulder adduction      Shoulder extension      Shoulder internal rotation      Shoulder external rotation      Middle trapezius      Lower trapezius      Elbow flexion 4/5 4/5  5/5 4+/5  Elbow extension 4/5 4/5 5/5 4+/5  Wrist flexion 4/5 4/5 5/5 4+/5  Wrist extension 4/5 4/5 5/5 4+/5  Wrist ulnar deviation      Wrist radial deviation      Wrist pronation      Wrist  supination      (Blank rows = not tested)  HAND FUNCTION:  Re-eval: Grip strength: Right: 65 lbs; Left: 69 lbs, Lateral pinch: Right: 17 lbs, Left: 16 lbs, and 3 point pinch: Right: 15 lbs, Left: 17 lbs  Eval: Grip strength: Right: 63 lbs; Left: 75 lbs, Lateral pinch: Right: 15 lbs, Left: 16 lbs, and 3 point pinch: Right: 14 lbs, Left: 17 lbs  COORDINATION:   Re-eval: 9 Hole Peg test: Right: 24 sec; Left: 26 sec.   Eval: 9 Hole Peg test: Right: 26 sec; Left: 22 sec  SENSATION: tingling/numb WFL Light touch: WFL Proprioception: WFL  EDEMA: WFL   COGNITION: Overall cognitive status: Within functional limits for tasks assessed; Pt. Reports that sometimes she can remember things, and sometimes  VISION: Subjective report: blurriness since the stroke   PRAXIS: Impaired fine motor control   TREATMENT DATE: 07/30/24   Therapeutic Ex.:   -Pt. performed AROM followed by PROM to the end ranges of motion within pain tolerance  Therapeutic Activities:  -Facilitated right hand progressive gross grip strengthening for 2 trials with the 1st with 17.9#, and the 2nd with 23.4# of grip strength resistive force. -Facilitated sustained right gross grasp on the gripper while reaching up to place the pegs into a target container. -Facilitated right Lateral, and 3pt. Pinch strengthening using yellow, red, green, and blue, and black level resistive clips    Neuromuscular re-education:   -Facilitated FMC tasks using the Grooved pegboard, grasping and 1 grooved pegs from a horizontal position in the shallow dish, and transitioning them to a vertical position in preparation for placing them upright into the pegboard.  -Facilitated thumb opposition from the tip of the thumb to the tip of the 2nd through 5th digit.  -Facilitated [facility]  skills grasping 1 sticks,  cylindrical collars, and  flat washers on the [place]urdue pegboard. Pt. worked on Geneticist, molecular item with the  2nd digit and thumb, and  storing them in the palm. Pt. worked on translatory movements moving the the pegs from the palm to the tip of the 2nd digit and thumb in preparation for discarding them. Pt. worked on performing bilateral alternating hand movements followed by bilateral simultaneous.  -Pt. was further challenged by adding a speed component to the task.       PATIENT EDUCATION: Education details:  RUE functioning Person educated: Patient Education method: Explanation, Demonstration, and Tactile cues Education comprehension: verbalized understanding, returned demonstration, verbal cues required, tactile cues required, and needs further education  HOME EXERCISE PROGRAM:  Pt. HEP was upgraded from pink theraputty to green level theraputty  GOALS: Goals reviewed with patient? yes  SHORT TERM GOALS: Target date: 08/08/2024    Pt. Will be  independent with HEPs for the RUE. Baseline: Re-eval: HEP for hand strengthening with pink theraputty for grip strength, pinch strength  Eval:  No current HEP  Goal status: INITIAL  LONG TERM GOALS: Target date: 09/19/2024  Pt. will be improve the MAM-20 score by 2 points to reflect progress with hand function during ADL/IADL tasks. Baseline: Re-eval: MAM-20 Sum score: 63 Eval: MAM-20 Sum score: 69 Goal status: INITIAL  2.  Pt. Will improve Right shoulder flexion AROM by 5 degrees to assist with performing hair care efficiently Baseline: Right shoulder flexion: 111(126), Abduction: 877(872) Eval: Right shoulder flexion: 92(103), Abduction: 82 (90)  Goal status: 9/03 (Re-eval) Revised for hair care  3.  Pt. Will improve BUE strength by 2 muscle grades to assist with ADLs, and IADLs. Baseline: Re-eval: Right shoulder flexion: 3-/5, abduction: 3/5, elbow flexion: 4/5, extension: 4/5, wrist flexion: 4/5, extension: 4/5, Left UE strength: 4/5 overall Eval: Right shoulder flexion: 3-/5, abduction: 3-/5, elbow flexion: 4/5, extension: 4/5, wrist flexion: 4/5, extension:  4/5 Goal status: INITIAL  4.  Pt. Will increase right grip strength by 5# to be able to securely hold objects Baseline: Re-eval: Grip strength: Right: 65 lbs; Left: 69 lbs Eval: Right: 63#, L: 75# Goal status: INITIAL  5.  Pt. Will increase right pinch strength by 3# to be able to open jars, bottles, containers Baseline: Re-eval: Lateral pinch: Right: 17 lbs, Left: 16 lbs, and 3 point pinch: Right: 15 lbs, Left: 17 lbs Eval: Lateral pinch: right: 15#, left: 16#; 3pt. Pinch: right: 14#, left: 17# Goal status: INITIAL  6.  Pt. Will improve right hand [facility] skills by 3 sec. Of speed to be able to efficiently manipulate small objects/buttons. Baseline: Re-eval:  9 Hole Peg test: Right: 24 sec; Left: 26 sec. Eval:Right: 27 sec. Left: 22 sec. Goal status: INITIAL  6.  Pt. Will write 3 sentences efficiently with 75% legibility in preparation for written correspondence Baseline: Re-eval: Name only: 75% legibility; Eval: name only: 50% legibility  Goal status: INITIAL   ASSESSMENT:  CLINICAL IMPRESSION:  Pt. is making excellent progress with right hand grip strength, pinch strength, FMC, speed, and dexterity skills. Pt. RUE ROM, and pain has improved overall. Will plan to obtain measurements next visit for possible discharge if Pt.status has continued to progress. Pt. continues to benefit from OT services to improve right hand function in order to work towards improving, and maximizing engagement in, and independence with ADLs, and IADL tasks.   PERFORMANCE DEFICITS: in functional skills including ADLs, IADLs, coordination, dexterity, proprioception, ROM, strength, Fine motor control, Gross motor control, vision, and UE functional use, pain.cognitive skills including memory, and psychosocial skills coping strategies, environmental adaptation, interpersonal interactions, and routines and behaviors.   IMPAIRMENTS: are limiting patient from ADLs, IADLs, rest and sleep, and leisure.   CO-MORBIDITIES:  may have co-morbidities  that affects occupational performance. Patient will benefit from skilled OT to address above impairments and improve overall function.  MODIFICATION OR ASSISTANCE TO COMPLETE EVALUATION: Min-Moderate modification of tasks or assist with assess necessary to complete an evaluation.  OT OCCUPATIONAL PROFILE AND HISTORY: Detailed assessment: Review of records and additional review of physical, cognitive, psychosocial history related to current functional performance.  CLINICAL DECISION MAKING: Moderate - several treatment options, min-mod task modification necessary  REHAB POTENTIAL: Good  EVALUATION COMPLEXITY: Moderate    PLAN:  OT FREQUENCY: 2x/week  OT DURATION: 12 weeks  PLANNED INTERVENTIONS: 97168 OT Re-evaluation, 97535 self care/ADL training, 02889 therapeutic exercise, 97530 therapeutic activity, 97112  neuromuscular re-education, 97140 manual therapy, 97018 paraffin, 02989 moist heat, 97034 contrast bath, passive range of motion, functional mobility training, patient/family education, and DME and/or AE instructions  RECOMMENDED OTHER SERVICES: OT  CONSULTED AND AGREED WITH PLAN OF CARE: Patient  PLAN FOR NEXT SESSION:   Treatment  Richardson Otter, MS, OTR/L   07/30/2024, 2:22 PM

## 2024-07-31 ENCOUNTER — Telehealth: Payer: Self-pay

## 2024-07-31 ENCOUNTER — Other Ambulatory Visit: Payer: Self-pay | Admitting: Physician Assistant

## 2024-07-31 DIAGNOSIS — I10 Essential (primary) hypertension: Secondary | ICD-10-CM

## 2024-07-31 MED ORDER — LOSARTAN POTASSIUM 50 MG PO TABS: 50.0000 mg | ORAL_TABLET | Freq: Every day | ORAL | 3 refills | Status: AC

## 2024-07-31 NOTE — Telephone Encounter (Signed)
 Pt advised that stopped lisinopril  and we sent losartan please keep Bp reading and call us  back may need to increase med

## 2024-08-01 ENCOUNTER — Ambulatory Visit: Admitting: Physical Therapy

## 2024-08-06 ENCOUNTER — Ambulatory Visit: Admitting: Occupational Therapy

## 2024-08-06 ENCOUNTER — Ambulatory Visit: Admitting: Physical Therapy

## 2024-08-06 NOTE — Therapy (Deleted)
 OUTPATIENT PHYSICAL THERAPY NEURO treatment.    Patient Name: Jamie Williams MRN: [id number] DOB:[birth year], 63 y.o., female Today's Date: 08/06/2024   PCP: Tinnie Pro, PA-C REFERRING PROVIDER: Tinnie Pro, PA-C  END OF SESSION:     Past Medical History:  Diagnosis Date   Anxiety    Family history of adverse reaction to anesthesia    sister had a difficult time waking up from mastectomy and passed away    GERD (gastroesophageal reflux disease)    History of chicken pox    History of kidney stones    Hyperlipidemia    Hypertension    PONV (postoperative nausea and vomiting)    nausea   Sciatic nerve pain    Past Surgical History:  Procedure Laterality Date   ANTERIOR CERVICAL DECOMP/DISCECTOMY FUSION N/A 12/24/2015   Procedure: Cervical five-six, Cervical six-seven  Anterior cervical decompression/diskectomy/fusion/interbody prosthesis/plate;  Surgeon: Reyes Budge, MD;  Location: [facility]URO ORS;  Service: Neurosurgery;  Laterality: N/A;   COLONOSCOPY WITH PROPOFOL  N/A 07/17/2018   Procedure: COLONOSCOPY WITH PROPOFOL ;  Surgeon: Therisa Bi, MD;  Location: [facility] ENDOSCOPY;  Service: Gastroenterology;  Laterality: N/A;   ECTOPIC PREGNANCY SURGERY  1991 and 1996   two Etopic preg.   Head Injuries  2006   surgery 2006 Cram/NS in [place]. Headaches with increased intracranial pressure. Repeat MRI in 2008 Negative   SUPRACERVICAL ABDOMINAL HYSTERECTOMY  2011   Abdominal; Ovaries intact; CERVIX INTACT. Fibroids/dys menorrhea. [place]   Ulnar Neuropathy Right 2004   Ulnar Neuropathy surgical revision. 2004-2008. [place].   Patient Active Problem List   Diagnosis Date Noted   Acute pancreatitis 05/14/2024   GERD without esophagitis 05/14/2024   Anxiety and depression 05/14/2024   History of stroke 05/14/2024   Cerebrovascular accident (CVA) (HCC) 03/27/2024   Essential hypertension 03/27/2024   Hypothyroidism 03/27/2024   Dyslipidemia 03/27/2024    Peripheral neuropathy 03/27/2024   Ambulatory dysfunction 03/27/2024   Hypothyroid 07/22/2020   Chronic, continuous use of opioids 04/15/2020   Pap smear abnormality of cervix/human papillomavirus (HPV) positive 08/06/2019   Aortic atherosclerosis 10/23/2018   GERD (gastroesophageal reflux disease)    Hot flashes due to menopause 01/10/2017   Cervical spondylosis with radiculopathy 12/24/2015   Compression injury of cervical spinal nerve 10/07/2015   Left arm pain 08/29/2015   Neck pain 08/29/2015   Hematuria 07/03/2015   Allergic arthritis 06/27/2015   Arthritis 06/27/2015   Back ache 06/27/2015   Clinical depression 06/27/2015   H/O abnormal cervical Papanicolaou smear 06/27/2015   Elevated intracranial pressure 06/27/2015   Kidney stones 06/27/2015   Knee pain 06/27/2015   Abnormal mammogram 06/27/2015   Headache, migraine 06/27/2015   Neuropathy 06/27/2015   Hand paresthesia 06/27/2015   Pre-diabetes 06/27/2015   Vitamin D  deficiency 06/27/2015   Weight loss 06/27/2015   Adjustment reaction 06/27/2015   Hypercholesterolemia without hypertriglyceridemia 01/29/2010   Tobacco abuse 01/28/2010   Primary hypertension 09/16/2009   Insomnia 09/16/2009   Lesion of ulnar nerve 09/16/2009    ONSET DATE: 03/26/2024  REFERRING DIAG:  M70.101 (ICD-10-CM) - Other symptoms and signs involving the musculoskeletal system  I69.30 (ICD-10-CM) - Unspecified sequelae of cerebral infarction  Z86.73 (ICD-10-CM) - Personal history of transient ischemic attack (TIA), and cerebral infarction without residual deficits    THERAPY DIAG:  No diagnosis found.  Rationale for Evaluation and Treatment: Rehabilitation  SUBJECTIVE:  SUBJECTIVE STATEMENT:  08/06/2024   Pt states that she is doing okay this  afternoon.  Has a little discomfort in the R shoulder, but took some tylenol  prior to PT, and reports that it is doing better.  Had a rough weekend with medication induced cough. Did not do much over the weekend.   Pt states that she is feeling much more confident with mobility. But still not 100%. Also reports that she will occasionally have a little dizziness when standing up or when looking up in cabinet. Otherwise. No other reports issues with mobility, except standing on one leg.   FROM EVAL: I feel some better. I just had a hard time in July. Right after I started PT I went to ED with stomach issues then back 5 days later on 05/14/2024- with pancreatitis- spent several days in hospital. Had a f/u with primary and received new order back for PT. I feel like I just don't have any energy On disability- 12-13 years  Pt accompanied by: self   PERTINENT HISTORY: PMH includes: HTN, Anxiety, Dyslipidemia, GERD, Peripheral Neuropathy, Hypothyroidism. Patient had initiated PT services on 04/19/24 was seen twice before going back to hospital and admitted with pancreatitis. She was originally being seen due to have a CVA on 03/26/2024  PAIN:  Are you having pain? Yes: NPRS scale: current 6/10 Pain location: Right shoulder  Pain description: achy Aggravating factors: any use of Right UE Relieving factors: rest, tylenol - helps some  PRECAUTIONS: Fall  RED FLAGS: None   WEIGHT BEARING RESTRICTIONS: No  FALLS: Has patient fallen in last 6 months? Yes. Number of falls 1 fall 3 months ago- sitting on sofa- was getting up and fell forward.   LIVING ENVIRONMENT: Lives with: lives with their family- Husband and son Lives in: House/apartment Stairs: Yes: External: 3 steps; can reach both Has following equipment at home: Single point cane, Walker - 4 wheeled, and shower chair  PLOF: Independent  PATIENT GOALS: I just want to get better- use my hand and improve my balance.   OBJECTIVE:  Note:  Objective measures were completed at Evaluation unless otherwise noted.  DIAGNOSTIC FINDINGS: CLINICAL DATA:  63 year old female with headache and pain. Status post left lateral thalamic/internal capsule lacunar infarcts last month. Epigastric pain radiating under the left breast with nausea.   EXAM: CT HEAD WITHOUT CONTRAST   TECHNIQUE: Contiguous axial images were obtained from the base of the skull through the vertex without intravenous contrast.   RADIATION DOSE REDUCTION: This exam was performed according to the departmental dose-optimization program which includes automated exposure control, adjustment of the mA and/or kV according to patient size and/or use of iterative reconstruction technique.   COMPARISON:  Brain MRI 03/26/2024. head CT 03/27/2024.   FINDINGS: Brain: Expected small foci of cystic encephalomalacia corresponding to the left deep gray and white matter lacunar infarcts on MRI last month. Elsewhere gray-white differentiation is stable.   Background cerebral volume remains normal for age. No midline shift, ventriculomegaly, mass effect, evidence of mass lesion, intracranial hemorrhage or evidence of cortically based acute infarction.   Vascular: No suspicious intracranial vascular hyperdensity. Calcified atherosclerosis at the skull base.   Skull: Previous suboccipital decompression. No acute osseous abnormality identified.   Sinuses/Orbits: Visualized paranasal sinuses and mastoids are stable and well aerated.   Other: No acute orbit or scalp soft tissue finding.   IMPRESSION: 1.  No acute intracranial abnormality. 2. Expected evolution of the Left deep gray and white matter lacunar infarcts on MRI  last month. 3. Previous suboccipital decompression.     Electronically Signed   By: VEAR Hurst M.D.   On: 05/09/2024 03:58  COGNITION: Overall cognitive status: Within functional limits for tasks assessed   SENSATION: R hand numbness and tingling    COORDINATION: Good heel to shin- bilateral  EDEMA:  None observed or reported   POSTURE: rounded shoulders, forward head, and increased thoracic kyphosis  LOWER EXTREMITY ROM:     Active  Right Eval Left Eval  Hip flexion    Hip extension    Hip abduction    Hip adduction    Hip internal rotation    Hip external rotation    Knee flexion    Knee extension    Ankle dorsiflexion    Ankle plantarflexion    Ankle inversion    Ankle eversion     (Blank rows = not tested)  LOWER EXTREMITY MMT:    MMT Right Eval Left Eval  Hip flexion 3+ 4+  Hip extension 4 4+  Hip abduction 4 4+  Hip adduction    Hip internal rotation    Hip external rotation    Knee flexion 4 5  Knee extension 4 5  Ankle dorsiflexion 4+ 5  Ankle plantarflexion    Ankle inversion    Ankle eversion    (Blank rows = not tested)  BED MOBILITY:  Not tested- Patient reports independent   TRANSFERS: Sit to stand: SBA  Assistive device utilized: None     Stand to sit: SBA  Assistive device utilized: None     Chair to chair: SBA  Assistive device utilized: None       RAMP:  Not tested  CURB:  Not tested  STAIRS: Not tested GAIT: Findings: Gait Characteristics: decreased arm swing- Right, decreased step length- Right, decreased step length- Left, and decreased stride length, Distance walked: approx 100 feet, and Level of assistance: SBA  FUNCTIONAL TESTS:  5 times sit to stand: 5 times sit to stand: 18.0 sec without UE Support Timed up and go (TUG): 9.78 sec  6 minute walk test: To be assessed next visit.  10 Meter walk test: 1.17  Berg Balance Scale:  Item Test date: 06/27/2024 Test date:  Test date:   Sitting to standing 4. able to stand without using hands and stabilize independently Insert OPRCBERGREEVAL SmartPhrase at re-test date Insert OPRCBERGREEVAL SmartPhrase at re-test date  2. Standing unsupported 4. able to stand safely for 2 minutes    3. Sitting with back unsupported, feet  supported 4. able to sit safely and securely for 2 minutes    4. Standing to sitting 3. controls descent by using hands    5. Pivot transfer  4. able to transfer safely with minor use of hands    6. Standing unsupported with eyes closed 4. able to stand 10 seconds safely    7. Standing unsupported with feet together 3. able to place feet together independently and stand 1 minute with supervision    8. Reaching forward with outstretched arms while standing 2. can reach forward 5 cm (2 inches)    9. Pick up object from the floor from standing 3. able to pick up slipper but needs supervision    10. Turning to look behind over left and right shoulders while standing 3. looks behind one side only, other side shows less weight shift    11. Turn 360 degrees 3. able to turn 360 degrees safely, one side only, in 4 seconds  or less    12. Place alternate foot on step or stool while standing unsupported 4. ble to stand independently and safely and complete 8 steps in 20 seconds    13. Standing unsupported one foot in front 3. able to place foot ahead independently and hold 30 seconds    14. Standing on one leg 2. able to lift leg independently and hold >= 3 seconds      Total Score 46/56 Total Score /56 Total Score /56     PATIENT SURVEYS:  ABC scale: The Activities-Specific Balance Confidence (ABC) Scale-66.25%                                                                                                                             TREATMENT DATE:   Nustep rolling hill program levl 2-6 with cues to maintain consistent SPM through varied resistance >40 as tolerated. Performed x 6 min.   Stair ascent/descent without UE support with reciprocal pattern   Standing on 1 foot with lateral pressure for glute med activation x 45 sec bil  Light Ue support for modified single leg good morning x 5 bil  Lateral lunge step to touch target on the ground. X 8 bil   Single limb step up to second step of stairs with  light UE support. X 6 bil  Single limb Lateral step up second step of stair with light UE support x 6 bil   Lateral step across 4 inch aerobic step x 15 bil  Reciprocal foot tap on 4inch step x 20 bil.  No UE support required.   Sit<>stand with overhead reach and rise into heel raise. Attempted jump into standing, but unable to clear BLE.   Standing hop at rail x 10 with heavy BUE support       PT provided supervision assist for safety throughout session with gait belt in place unless otherwise noted.       PATIENT EDUCATION: Education details: Exam details, PT plan of care; Purpose of functional outcome measures, Education in HEP Person educated: Patient Education method: Explanation Education comprehension: verbalized understanding  HOME EXERCISE PROGRAM: Access Code: [email or web address] URL: [email or web address] Date: 07/30/2024 Prepared by: Massie Dollar  Exercises - Tandem Stance  - 1 x daily - 5 x weekly - 3 sets - 4 reps - 30 sec hold - Tandem Walking with Counter Support  - 1 x daily - 5 x weekly - 3 sets - 10 reps - Single Leg Stance with Support  - 1 x daily - 5 x weekly - 3 sets - 4 reps - 20 sec  hold - Sit to Stand with Arms Crossed  - 1 x daily - 5 x weekly - 3 sets - 10 reps - Backward Walking with Counter Support  - 1 x daily - 5 x weekly - 3 sets - 10 reps - Side Lunge with Counter Support  - 1 x daily - 7 x weekly - 3 sets -  10 reps - Lunge with Counter Support  - 1 x daily - 7 x weekly - 3 sets - 10 reps  GOALS: Goals reviewed with patient? Yes  SHORT TERM GOALS: Target date: 08/08/2024  Patient will be independent in home exercise program to improve strength/mobility for better functional independence with ADLs. Baseline: EVAL - No formal HEP in place- edcuated in Tandem and SLS today and added to HEP.  Goal status: INITIAL   LONG TERM GOALS: Target date: 09/19/2024  1.  Patient will complete five times sit to stand test in < 15 seconds  indicating an increased LE strength and improved balance. Baseline: EVAL- 18.0 sec without UE Support Goal status: INITIAL  2.  Patient will improve ABC score by 9 points    to demonstrate statistically significant improvement in mobility and quality of life as it relates to their confidence in her balance.  Baseline: 66.25% Goal status: INITIAL   3.  Patient will increase Berg Balance score by > 6 points to demonstrate decreased fall risk during functional activities. Baseline: EVAL= 46/56 Goal status: INITIAL  4.   Patient will increase six minute walk test distance by >110ft for progression to community ambulator and improve gait ability Baseline: 1,122 Goal status: INITIAL  ASSESSMENT:  CLINICAL IMPRESSION: Patient is a 63 y.o. female who was seen today for physical therapy  treatment for cerebral infarction. PT treatment focused on SLS stance tasks as well as improve explosive movements including hop with UE support. Mild UE support required for step up to second steps but intermittently was able to perform without UE support. HEP expanded as listed. Pt will benefit from further skilled PT interventions to improve her mobility, strength, and balance  in order to increase ease and safety with mobility and ADLs and reduce fall risk.   OBJECTIVE IMPAIRMENTS: Abnormal gait, decreased activity tolerance, decreased balance, decreased coordination, decreased endurance, decreased mobility, difficulty walking, and decreased strength.   ACTIVITY LIMITATIONS: carrying, lifting, bending, sitting, standing, squatting, sleeping, stairs, and transfers  PARTICIPATION LIMITATIONS: meal prep, cleaning, laundry, driving, shopping, community activity, and yard work  PERSONAL FACTORS: 3+ comorbidities: knee pain, spinal pain, depression are also affecting patient's functional outcome.   REHAB POTENTIAL: Good  CLINICAL DECISION MAKING: Evolving/moderate complexity  EVALUATION COMPLEXITY:  Moderate  PLAN:  PT FREQUENCY: 1-2x/week  PT DURATION: 12 weeks  PLANNED INTERVENTIONS: 97164- PT Re-evaluation, 97750- Physical Performance Testing, 97110-Therapeutic exercises, 97530- Therapeutic activity, [id number]- Neuromuscular re-education, 97535- Self Care, [id number]- Manual therapy, [id number]- Gait training, [id number]- Orthotic Initial, [id number]- Orthotic/Prosthetic subsequent, [phone]- Canalith repositioning, [id number]- Electrical stimulation (manual), [id number]- Ultrasound, [id number] (1-2 muscles), [id number] (3+ muscles)- Dry Needling, Patient/Family education, Balance training, Stair training, Taping, Joint mobilization, Joint manipulation, Spinal manipulation, Spinal mobilization, Vestibular training, DME instructions, Cryotherapy, and Moist heat  PLAN FOR NEXT SESSION:   High level balance training with obstacle course or Blaze pods.  Outdoor mobility.    Lonni KATHEE Gainer, PT 08/06/2024, 8:48 AM

## 2024-08-08 ENCOUNTER — Ambulatory Visit: Admitting: Occupational Therapy

## 2024-08-08 ENCOUNTER — Ambulatory Visit: Admitting: Physical Therapy

## 2024-08-13 ENCOUNTER — Ambulatory Visit: Admitting: Occupational Therapy

## 2024-08-13 ENCOUNTER — Ambulatory Visit: Admitting: Physical Therapy

## 2024-08-13 DIAGNOSIS — R2681 Unsteadiness on feet: Secondary | ICD-10-CM | POA: Diagnosis not present

## 2024-08-13 DIAGNOSIS — M6281 Muscle weakness (generalized): Secondary | ICD-10-CM

## 2024-08-13 DIAGNOSIS — R278 Other lack of coordination: Secondary | ICD-10-CM | POA: Diagnosis not present

## 2024-08-13 NOTE — Therapy (Signed)
 OUTPATIENT OCCUPATIONAL THERAPY NEURO TREATMENT/DISCHARGE NOTE  Patient Name: Jamie Williams MRN: [id number] DOB:06-Dec-1960, 63 y.o., female Today's Date: 08/13/2024  PCP: Kristina Tinnie POUR, PA-C REFERRING PROVIDER: Liana Fish, NP  END OF SESSION:  OT End of Session - 08/13/24 1403     Visit Number 6    Number of Visits 24    Date for Recertification  09/19/24    OT Start Time 1400    OT Stop Time 1445    OT Time Calculation (min) 45 min    Activity Tolerance Patient tolerated treatment well    Behavior During Therapy WFL for tasks assessed/performed              Past Medical History:  Diagnosis Date   Anxiety    Family history of adverse reaction to anesthesia    sister had a difficult time waking up from mastectomy and passed away    GERD (gastroesophageal reflux disease)    History of chicken pox    History of kidney stones    Hyperlipidemia    Hypertension    PONV (postoperative nausea and vomiting)    nausea   Sciatic nerve pain    Past Surgical History:  Procedure Laterality Date   ANTERIOR CERVICAL DECOMP/DISCECTOMY FUSION N/A 12/24/2015   Procedure: Cervical five-six, Cervical six-seven  Anterior cervical decompression/diskectomy/fusion/interbody prosthesis/plate;  Surgeon: Reyes Budge, MD;  Location: [facility] NEURO ORS;  Service: Neurosurgery;  Laterality: N/A;   COLONOSCOPY WITH PROPOFOL  N/A 07/17/2018   Procedure: COLONOSCOPY WITH PROPOFOL ;  Surgeon: Therisa Bi, MD;  Location: [facility] ENDOSCOPY;  Service: Gastroenterology;  Laterality: N/A;   ECTOPIC PREGNANCY SURGERY  1991 and 1996   two Etopic preg.   Head Injuries  2006   surgery 2006 Cram/NS in [place]. Headaches with increased intracranial pressure. Repeat MRI in 2008 Negative   SUPRACERVICAL ABDOMINAL HYSTERECTOMY  2011   Abdominal; Ovaries intact; CERVIX INTACT. Fibroids/dys menorrhea. [place]   Ulnar Neuropathy Right 2004   Ulnar Neuropathy surgical revision. 2004-2008. [place].   Patient Active Problem List   Diagnosis Date Noted   Acute pancreatitis 05/14/2024   GERD without esophagitis 05/14/2024   Anxiety and depression 05/14/2024   History of stroke 05/14/2024   Cerebrovascular accident (CVA) (HCC) 03/27/2024   Essential hypertension 03/27/2024   Hypothyroidism 03/27/2024   Dyslipidemia 03/27/2024   Peripheral neuropathy 03/27/2024   Ambulatory dysfunction 03/27/2024   Hypothyroid 07/22/2020   Chronic, continuous use of opioids 04/15/2020   Pap smear abnormality of cervix/human papillomavirus (HPV) positive 08/06/2019   Aortic atherosclerosis 10/23/2018   GERD (gastroesophageal reflux disease)    Hot flashes due to menopause 01/10/2017   Cervical spondylosis with radiculopathy 12/24/2015   Compression injury of cervical spinal nerve 10/07/2015   Left arm pain 08/29/2015   Neck pain 08/29/2015   Hematuria 07/03/2015   Allergic arthritis 06/27/2015   Arthritis 06/27/2015   Back ache 06/27/2015   Clinical depression 06/27/2015   H/O abnormal cervical Papanicolaou smear 06/27/2015   Elevated intracranial pressure 06/27/2015   Kidney stones 06/27/2015   Knee pain 06/27/2015   Abnormal mammogram 06/27/2015   Headache, migraine 06/27/2015   Neuropathy 06/27/2015   Hand paresthesia 06/27/2015   Pre-diabetes 06/27/2015   Vitamin D  deficiency 06/27/2015   Weight loss 06/27/2015   Adjustment reaction 06/27/2015   Hypercholesterolemia without hypertriglyceridemia 01/29/2010   Tobacco abuse 01/28/2010   Primary hypertension 09/16/2009   Insomnia 09/16/2009   Lesion of ulnar nerve 09/16/2009    ONSET DATE: 03/26/24  REFERRING  DIAG: CVA  THERAPY DIAG:  Weakness, lack of coordination  Rationale for Evaluation and Treatment: Rehabilitation  SUBJECTIVE:   SUBJECTIVE STATEMENT:  Pt. reports that she has had a change in medication, and that her cough has improved. Pt accompanied by: self  PERTINENT HISTORY:   Addendum: Pt. was  previously evaluated by OT services 2/2 a CVA as per the pertinent history below. Pt., however, was hospitalized from 7/21/-05/16/24 for Pancreatitis and abdominal pain shortly after being evaluated.    Pt. is a 63 y.o. female who was admitted to the hospital with an acute onset of back pain, trouble walking RUE and LE weakness. Imaging revealed an acute ischemic vertebrobasilar artery thalamic stroke, and Degenerative Changes of the Lumbar spine  L5-S1. PHMx includes: Peripheral neuropathy, Essential HTN, Hypothyroidism, Dyslipidemia, Ambulatory Dysfunction. Pt. has a history of neck, and right shoulder pain with a planned MRI in August 2025. Pt. Has a History of remote right elbow injury from working in the [employer].   PRECAUTIONS: None  WEIGHT BEARING RESTRICTIONS: No  PAIN: 08/13/24: 3/10 neck 07/30/24: No pain reported 07/09/24: 8/10 pain in the right shoulder  07/02/24: 6/10 pain in the shoulder, and back Eval: Are you having pain? Right  shoulder 7/10 pain. Has 7/10 Sciatica; sometimes reaches a 10/10 at home; arthritis in the spine  FALLS: Has patient fallen in last 6 months? 1 fall getting up from the couch.  LIVING ENVIRONMENT: Lives with: lives with their spouse and lives with their son Lives in: House/apartment Stairs:  3 steps to enter; one level home Has following equipment at home: Counselling psychologist, BSCommode, rollator  PLOF: Independent  PATIENT GOALS: To get back to her PLOF  OBJECTIVE:  Note: Objective measures were completed at Evaluation unless otherwise noted.  HAND DOMINANCE: Right  ADLs:  Transfers/ambulation related to ADLs: Independent Eating: Independent, increased time required, loses grip on a glass, difficulty cutting food Grooming: Independent UB Dressing: Independent, some difficulty with buttoning LB Dressing: independent Toileting: Independent Bathing: Independent  Tub Shower transfers: Independent; distance supervision -showers when family is  home   IADLs: Shopping: Sister has been doing the grocery shopping Light housekeeping: Writer,  has not washed dishes, independent with bedmaking increased time  2/2 decreased balance Meal Prep: Independent Community mobility: Drives a little bit Medication management: Has difficulty using the right hand to open medication bottles Financial management: No changes in the process Handwriting: 75% legibility for name only Typing/cellphone use: Difficulty using then right to manage the cellphone. 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 Hobbies: Crafts  MOBILITY STATUS: Independent without an assistive device    ACTIVITY TOLERANCE: Activity tolerance: WFL  FUNCTIONAL OUTCOME MEASURES: Eval: MAM Measure Sum Score: 69 Re-eval: MAM Measure Score: 63  UPPER EXTREMITY ROM:    Active ROM Right Eval 04/19/24 Right Re-Eval 06/27/24 Right 08/13/24 Left Eval 04/19/24 Left: Re-eval 06/27/24   Shoulder flexion 92(103) 111(126) 150 WFL WFL  Shoulder abduction 82(90) 122(127) 142 WFL WFL  Shoulder adduction       Shoulder extension       Shoulder internal rotation       Shoulder external rotation       Elbow flexion [facility] WFL WFL  Elbow extension [facility] WFL WFL  Wrist flexion [facility] WFL WFL WFL  Wrist extension WFL [facility] WFL WFL WFL  Wrist ulnar deviation       Wrist radial deviation       Wrist  pronation       Wrist supination       (Blank rows = not tested)  UPPER EXTREMITY MMT:     MMT Right Eval 04/19/24 Right Re-eval 06/27/24 Right  Re-eval 06/27/24 Right 07/2024 Left Eval 04/19/24  Shoulder flexion 3-/5 3-/5 4+/5 5/5 5/5  Shoulder abduction 3-/5 3-/5 4+/5 5/5 5/5  Shoulder adduction       Shoulder extension       Shoulder internal rotation       Shoulder external rotation       Middle trapezius       Lower trapezius       Elbow flexion 4/5 4/5  4+/5 5/5 5/5  Elbow extension 4/5 4/5 4+/5 5/5 5/5  Wrist flexion 4/5  4/5 4+/5 5/5 5/5  Wrist extension 4/5 4/5 4+/5 5/5 5/5  Wrist ulnar deviation       Wrist radial deviation       Wrist pronation       Wrist supination       (Blank rows = not tested)  HAND FUNCTION:  10/20:25: Grip strength: Right: 70 lbs; Left: 69 lbs,  Pinch meter  is out for calibration   Re-eval: Grip strength: Right: 65 lbs; Left: 69 lbs, Lateral pinch: Right: 17 lbs, Left: 16 lbs, and 3 point pinch: Right: 15 lbs, Left: 17 lbs  Eval: Grip strength: Right: 63 lbs; Left: 75 lbs, Lateral pinch: Right: 15 lbs, Left: 16 lbs, and 3 point pinch: Right: 14 lbs, Left: 17 lbs  COORDINATION:  08/13/24: 9 Hole Peg test: Right: 23 sec; Left: 26 sec.   Re-eval: 9 Hole Peg test: Right: 24 sec; Left: 26 sec.   Eval: 9 Hole Peg test: Right: 26 sec; Left: 22 sec  SENSATION: tingling/numb WFL Light touch: WFL Proprioception: WFL  EDEMA: WFL   COGNITION: Overall cognitive status: Within functional limits for tasks assessed; Pt. Reports that sometimes she can remember things, and sometimes  VISION: Subjective report: blurriness since the stroke   PRAXIS: Impaired fine motor control   TREATMENT DATE: 08/13/24   Measurements were reviewed, and goals were reviewed with the Pt.  PATIENT EDUCATION: Education details:  RUE functioning Person educated: Patient Education method: Explanation, Demonstration, and Tactile cues Education comprehension: verbalized understanding, returned demonstration, verbal cues required, tactile cues required, and needs further education  HOME EXERCISE PROGRAM:  Pt. HEP was upgraded from pink theraputty to green level theraputty  GOALS: Goals reviewed with patient? yes  SHORT TERM GOALS: Target date: 08/08/2024    Pt. Will be independent with HEPs for the RUE. Baseline: 08/13/24: Independent Re-eval: HEP for hand strengthening with pink theraputty for grip strength, pinch strength  Eval:  No current HEP  Goal status:  Achieved  LONG TERM  GOALS: Target date: 09/19/2024  Pt. will be improve the MAM-20 score by 2 points to reflect progress with hand function during ADL/IADL tasks. Baseline: 08/13/24:MAM-20 Sum score: 80 Re-eval: MAM-20 Sum score: 63 Eval: MAM-20 Sum score: 69 Goal status: INITIAL  2.  Pt. Will improve Right shoulder flexion AROM by 5 degrees to assist with performing hair care efficiently Baseline: 08/13/24:Right shoulder flexion: 150, Abduction: 142. Independent with hair care Re-eval: Right shoulder flexion: 111(126), Abduction: 877(872) Eval: Right shoulder flexion: 92(103), Abduction: 82 (90)  Goal status:  Achieved  3.  Pt. Will improve BUE strength by 2 muscle grades to assist with ADLs, and IADLs. Baseline: 08/13/24: 5/5 overall Re-eval: Right shoulder flexion: 3-/5, abduction: 3/5, elbow flexion: 4/5,  extension: 4/5, wrist flexion: 4/5, extension: 4/5, Left UE strength: 4/5 overall Eval: Right shoulder flexion: 3-/5, abduction: 3-/5, elbow flexion: 4/5, extension: 4/5, wrist flexion: 4/5, extension: 4/5 Goal status: Achieved  4.  Pt. Will increase right grip strength by 5# to be able to securely hold objects Baseline: 08/13/24:  Grip strength: Right: 70 lbs; Left: 69 lbs Re-eval: Grip strength: Right: 65 lbs; Left: 69 lbs Eval: Right: 63#, L: 75# Goal status: Achieved  5.  Pt. Will increase right pinch strength by 3# to be able to open jars, bottles, containers Baseline: 08/13/24: Progressed overall with opening bottles, jars, and containers. (N/A-Pinch meter is out for calibration) Re-eval: Lateral pinch: Right: 17 lbs, Left: 16 lbs, and 3 point pinch: Right: 15 lbs, Left: 17 lbs Eval: Lateral pinch: right: 15#, left: 16#; 3pt. Pinch: right: 14#, left: 17# Goal status: Achieved  6.  Pt. Will improve right hand [facility] skills by 3 sec. Of speed to be able to efficiently manipulate small objects/buttons. Baseline: 08/13/24: 9 Hole Peg test: Right: 23 sec; Left: 26 sec. Pt. Is able to efficiently manipulate  small objects. Re-eval:  9 Hole Peg test: Right: 24 sec; Left: 26 sec. Eval:Right: 27 sec. Left: 22 sec. Goal status: Achieved  6.  Pt. Will write 3 sentences efficiently with 75% legibility in preparation for written correspondence Baseline: 08/13/24: 100% legible for 3 sentences. Re-eval: Name only: 75% legibility; Eval: name only: 50% legibility  Goal status: Achieved   ASSESSMENT:  CLINICAL IMPRESSION:  Pt. has made excellent progress with RUE functioning. Pt. has met all goals established, and revised goals. Pt. has increased left shoulder ROM, has improved with pain, and is now using her hand more during daily ADL, and IADL tasks. Pt. is now appropriate for discharge from OT services. Pt. Is in agreement.  PERFORMANCE DEFICITS: in functional skills including ADLs, IADLs, coordination, dexterity, proprioception, ROM, strength, Fine motor control, Gross motor control, vision, and UE functional use, pain.cognitive skills including memory, and psychosocial skills coping strategies, environmental adaptation, interpersonal interactions, and routines and behaviors.   IMPAIRMENTS: are limiting patient from ADLs, IADLs, rest and sleep, and leisure.   CO-MORBIDITIES: may have co-morbidities  that affects occupational performance. Patient will benefit from skilled OT to address above impairments and improve overall function.  MODIFICATION OR ASSISTANCE TO COMPLETE EVALUATION: Min-Moderate modification of tasks or assist with assess necessary to complete an evaluation.  OT OCCUPATIONAL PROFILE AND HISTORY: Detailed assessment: Review of records and additional review of physical, cognitive, psychosocial history related to current functional performance.  CLINICAL DECISION MAKING: Moderate - several treatment options, min-mod task modification necessary  REHAB POTENTIAL: Good  EVALUATION COMPLEXITY: Moderate    PLAN:  OT FREQUENCY: 2x/week  OT DURATION: 12 weeks  PLANNED INTERVENTIONS:  97168 OT Re-evaluation, 97535 self care/ADL training, 02889 therapeutic exercise, 97530 therapeutic activity, 97112 neuromuscular re-education, 97140 manual therapy, 97018 paraffin, 02989 moist heat, 97034 contrast bath, passive range of motion, functional mobility training, patient/family education, and DME and/or AE instructions  RECOMMENDED OTHER SERVICES: OT  CONSULTED AND AGREED WITH PLAN OF CARE: Patient  PLAN FOR NEXT SESSION:   Treatment  Richardson Otter, MS, OTR/L   08/13/2024, 2:06 PM

## 2024-08-13 NOTE — Therapy (Unsigned)
 OUTPATIENT PHYSICAL THERAPY NEURO treatment.    Patient Name: Jamie Williams MRN: [id number] DOB:February 15, 1961, 63 y.o., female Today's Date: 08/13/2024   PCP: Tinnie Pro, PA-C REFERRING PROVIDER: Tinnie Pro, PA-C  END OF SESSION:  PT End of Session - 08/13/24 1452     Visit Number 6    Number of Visits 24    Date for Recertification  09/19/24    Progress Note Due on Visit 10    PT Start Time 1451    PT Stop Time 1530    PT Time Calculation (min) 39 min    Equipment Utilized During Treatment Gait belt    Activity Tolerance Patient tolerated treatment well    Behavior During Therapy WFL for tasks assessed/performed           Past Medical History:  Diagnosis Date   Anxiety    Family history of adverse reaction to anesthesia    sister had a difficult time waking up from mastectomy and passed away    GERD (gastroesophageal reflux disease)    History of chicken pox    History of kidney stones    Hyperlipidemia    Hypertension    PONV (postoperative nausea and vomiting)    nausea   Sciatic nerve pain    Past Surgical History:  Procedure Laterality Date   ANTERIOR CERVICAL DECOMP/DISCECTOMY FUSION N/A 12/24/2015   Procedure: Cervical five-six, Cervical six-seven  Anterior cervical decompression/diskectomy/fusion/interbody prosthesis/plate;  Surgeon: Reyes Budge, MD;  Location: [facility]UR[facility];  Service: Neurosurgery;  Laterality: N/A;   COLONOSCOPY WITH PROPOFOL  N/A 07/17/2018   Procedure: COLONOSCOPY WITH PROPOFOL ;  Surgeon: Therisa Bi, MD;  Location: [facility] ENDOSCOPY;  Service: Gastroenterology;  Laterality: N/A;   ECTOPIC PREGNANCY SURGERY  1991 and 1996   two Etopic preg.   Head Injuries  2006   surgery 2006 Cram/NS in [place]. Headaches with increased intracranial pressure. Repeat MRI in 2008 Negative   SUPRACERVICAL ABDOMINAL HYSTERECTOMY  2011   Abdominal; Ovaries intact; CERVIX INTACT. Fibroids/dys menorrhea. [facility]   Ulnar Neuropathy Right  2004   Ulnar Neuropathy surgical revision. 2004-2008. [facility].   Patient Active Problem List   Diagnosis Date Noted   Acute pancreatitis 05/14/2024   GERD without esophagitis 05/14/2024   Anxiety and depression 05/14/2024   History of stroke 05/14/2024   Cerebrovascular accident (CVA) (HCC) 03/27/2024   Essential hypertension 03/27/2024   Hypothyroidism 03/27/2024   Dyslipidemia 03/27/2024   Peripheral neuropathy 03/27/2024   Ambulatory dysfunction 03/27/2024   Hypothyroid 07/22/2020   Chronic, continuous use of opioids 04/15/2020   Pap smear abnormality of cervix/human papillomavirus (HPV) positive 08/06/2019   Aortic atherosclerosis 10/23/2018   GERD (gastroesophageal reflux disease)    Hot flashes due to menopause 01/10/2017   Cervical spondylosis with radiculopathy 12/24/2015   Compression injury of cervical spinal nerve 10/07/2015   Left arm pain 08/29/2015   Neck pain 08/29/2015   Hematuria 07/03/2015   Allergic arthritis 06/27/2015   Arthritis 06/27/2015   Back ache 06/27/2015   Clinical depression 06/27/2015   H/O abnormal cervical Papanicolaou smear 06/27/2015   Elevated intracranial pressure 06/27/2015   Kidney stones 06/27/2015   Knee pain 06/27/2015   Abnormal mammogram 06/27/2015   Headache, migraine 06/27/2015   Neuropathy 06/27/2015   Hand paresthesia 06/27/2015   Pre-diabetes 06/27/2015   Vitamin D  deficiency 06/27/2015   Weight loss 06/27/2015   Adjustment reaction 06/27/2015   Hypercholesterolemia without hypertriglyceridemia 01/29/2010   Tobacco abuse 01/28/2010   Primary hypertension 09/16/2009   Insomnia  09/16/2009   Lesion of ulnar nerve 09/16/2009    ONSET DATE: 03/26/2024  REFERRING DIAG:  M70.101 (ICD-10-CM) - Other symptoms and signs involving the musculoskeletal system  I69.30 (ICD-10-CM) - Unspecified sequelae of cerebral infarction  Z86.73 (ICD-10-CM) - Personal history of transient ischemic attack (TIA), and cerebral infarction  without residual deficits    THERAPY DIAG:  Muscle weakness (generalized)  Other lack of coordination  Unsteadiness on feet  Rationale for Evaluation and Treatment: Rehabilitation  SUBJECTIVE:                                                                                                                                                                                             SUBJECTIVE STATEMENT:  08/13/2024   Pt states that she is doing okay this afternoon.  States that she missed last week's appointment due to back pain. It is feeling better today. Pain still present, but manageable on this day. Has taken extra-strength tylenol  and pain patch for management today.   FROM EVAL: I feel some better. I just had a hard time in July. Right after I started PT I went to ED with stomach issues then back 5 days later on 05/14/2024- with pancreatitis- spent several days in hospital. Had a f/u with primary and received new order back for PT. I feel like I just don't have any energy On disability- 12-13 years  Pt accompanied by: self   PERTINENT HISTORY: PMH includes: HTN, Anxiety, Dyslipidemia, GERD, Peripheral Neuropathy, Hypothyroidism. Patient had initiated PT services on 04/19/24 was seen twice before going back to hospital and admitted with pancreatitis. She was originally being seen due to have a CVA on 03/26/2024  PAIN:  Are you having pain? Yes: NPRS scale: current 6/10 Pain location: Right shoulder  Pain description: achy Aggravating factors: any use of Right UE Relieving factors: rest, tylenol - helps some  PRECAUTIONS: Fall  RED FLAGS: None   WEIGHT BEARING RESTRICTIONS: No  FALLS: Has patient fallen in last 6 months? Yes. Number of falls 1 fall 3 months ago- sitting on sofa- was getting up and fell forward.   LIVING ENVIRONMENT: Lives with: lives with their family- Husband and son Lives in: House/apartment Stairs: Yes: External: 3 steps; can reach both Has following  equipment at home: Single point cane, Walker - 4 wheeled, and shower chair  PLOF: Independent  PATIENT GOALS: I just want to get better- use my hand and improve my balance.   OBJECTIVE:  Note: Objective measures were completed at Evaluation unless otherwise noted.  DIAGNOSTIC FINDINGS: CLINICAL DATA:  63 year old female with headache and pain. Status post left lateral thalamic/internal  capsule lacunar infarcts last month. Epigastric pain radiating under the left breast with nausea.   EXAM: CT HEAD WITHOUT CONTRAST   TECHNIQUE: Contiguous axial images were obtained from the base of the skull through the vertex without intravenous contrast.   RADIATION DOSE REDUCTION: This exam was performed according to the departmental dose-optimization program which includes automated exposure control, adjustment of the mA and/or kV according to patient size and/or use of iterative reconstruction technique.   COMPARISON:  Brain MRI 03/26/2024. head CT 03/27/2024.   FINDINGS: Brain: Expected small foci of cystic encephalomalacia corresponding to the left deep gray and white matter lacunar infarcts on MRI last month. Elsewhere gray-white differentiation is stable.   Background cerebral volume remains normal for age. No midline shift, ventriculomegaly, mass effect, evidence of mass lesion, intracranial hemorrhage or evidence of cortically based acute infarction.   Vascular: No suspicious intracranial vascular hyperdensity. Calcified atherosclerosis at the skull base.   Skull: Previous suboccipital decompression. No acute osseous abnormality identified.   Sinuses/Orbits: Visualized paranasal sinuses and mastoids are stable and well aerated.   Other: No acute orbit or scalp soft tissue finding.   IMPRESSION: 1.  No acute intracranial abnormality. 2. Expected evolution of the Left deep gray and white matter lacunar infarcts on MRI last month. 3. Previous suboccipital decompression.      Electronically Signed   By: VEAR Hurst M.D.   On: 05/09/2024 03:58  COGNITION: Overall cognitive status: Within functional limits for tasks assessed   SENSATION: R hand numbness and tingling   COORDINATION: Good heel to shin- bilateral  EDEMA:  None observed or reported   POSTURE: rounded shoulders, forward head, and increased thoracic kyphosis  LOWER EXTREMITY ROM:     Active  Right Eval Left Eval  Hip flexion    Hip extension    Hip abduction    Hip adduction    Hip internal rotation    Hip external rotation    Knee flexion    Knee extension    Ankle dorsiflexion    Ankle plantarflexion    Ankle inversion    Ankle eversion     (Blank rows = not tested)  LOWER EXTREMITY MMT:    MMT Right Eval Left Eval  Hip flexion 3+ 4+  Hip extension 4 4+  Hip abduction 4 4+  Hip adduction    Hip internal rotation    Hip external rotation    Knee flexion 4 5  Knee extension 4 5  Ankle dorsiflexion 4+ 5  Ankle plantarflexion    Ankle inversion    Ankle eversion    (Blank rows = not tested)  BED MOBILITY:  Not tested- Patient reports independent   TRANSFERS: Sit to stand: SBA  Assistive device utilized: None     Stand to sit: SBA  Assistive device utilized: None     Chair to chair: SBA  Assistive device utilized: None       RAMP:  Not tested  CURB:  Not tested  STAIRS: Not tested GAIT: Findings: Gait Characteristics: decreased arm swing- Right, decreased step length- Right, decreased step length- Left, and decreased stride length, Distance walked: approx 100 feet, and Level of assistance: SBA  FUNCTIONAL TESTS:  5 times sit to stand: 5 times sit to stand: 18.0 sec without UE Support Timed up and go (TUG): 9.78 sec  6 minute walk test: To be assessed next visit.  10 Meter walk test: 1.17  Berg Balance Scale:  Item Test date: 06/27/2024 Test date:  Test date:   Sitting to standing 4. able to stand without using hands and stabilize independently Insert  OPRCBERGREEVAL SmartPhrase at re-test date Insert OPRCBERGREEVAL SmartPhrase at re-test date  2. Standing unsupported 4. able to stand safely for 2 minutes    3. Sitting with back unsupported, feet supported 4. able to sit safely and securely for 2 minutes    4. Standing to sitting 3. controls descent by using hands    5. Pivot transfer  4. able to transfer safely with minor use of hands    6. Standing unsupported with eyes closed 4. able to stand 10 seconds safely    7. Standing unsupported with feet together 3. able to place feet together independently and stand 1 minute with supervision    8. Reaching forward with outstretched arms while standing 2. can reach forward 5 cm (2 inches)    9. Pick up object from the floor from standing 3. able to pick up slipper but needs supervision    10. Turning to look behind over left and right shoulders while standing 3. looks behind one side only, other side shows less weight shift    11. Turn 360 degrees 3. able to turn 360 degrees safely, one side only, in 4 seconds or less    12. Place alternate foot on step or stool while standing unsupported 4. ble to stand independently and safely and complete 8 steps in 20 seconds    13. Standing unsupported one foot in front 3. able to place foot ahead independently and hold 30 seconds    14. Standing on one leg 2. able to lift leg independently and hold >= 3 seconds      Total Score 46/56 Total Score /56 Total Score /56     PATIENT SURVEYS:  ABC scale: The Activities-Specific Balance Confidence (ABC) Scale-66.25%                                                                                                                             TREATMENT DATE:   Nustep rolling hill program levl 2-6 with cues to maintain consistent SPM through varied resistance >40 as tolerated. Performed x 6 min.   Stair ascent/descent without UE support with reciprocal pattern   Standing on 1 foot with lateral pressure for glute med  activation x 45 sec bil  Light Ue support for modified single leg good morning x 5 bil  Lateral lunge step to touch target on the ground. X 8 bil   Single limb step up to second step of stairs with light UE support. X 6 bil  Single limb Lateral step up second step of stair with light UE support x 6 bil   Lateral step across 4 inch aerobic step x 15 bil  Reciprocal foot tap on 4inch step x 20 bil.  No UE support required.   Sit<>stand with overhead reach and rise into heel raise. Attempted jump into standing, but unable to  clear BLE.   Standing hop at rail x 10 with heavy BUE support       PT provided supervision assist for safety throughout session with gait belt in place unless otherwise noted.       PATIENT EDUCATION: Education details: Exam details, PT plan of care; Purpose of functional outcome measures, Education in HEP Person educated: Patient Education method: Explanation Education comprehension: verbalized understanding  HOME EXERCISE PROGRAM: Access Code: [email or web address] URL: [email or web address] Date: 07/30/2024 Prepared by: Massie Dollar  Exercises - Tandem Stance  - 1 x daily - 5 x weekly - 3 sets - 4 reps - 30 sec hold - Tandem Walking with Counter Support  - 1 x daily - 5 x weekly - 3 sets - 10 reps - Single Leg Stance with Support  - 1 x daily - 5 x weekly - 3 sets - 4 reps - 20 sec  hold - Sit to Stand with Arms Crossed  - 1 x daily - 5 x weekly - 3 sets - 10 reps - Backward Walking with Counter Support  - 1 x daily - 5 x weekly - 3 sets - 10 reps - Side Lunge with Counter Support  - 1 x daily - 7 x weekly - 3 sets - 10 reps - Lunge with Counter Support  - 1 x daily - 7 x weekly - 3 sets - 10 reps  GOALS: Goals reviewed with patient? Yes  SHORT TERM GOALS: Target date: 08/08/2024  Patient will be independent in home exercise program to improve strength/mobility for better functional independence with ADLs. Baseline: EVAL - No formal HEP in  place- edcuated in Tandem and SLS today and added to HEP.  Goal status: INITIAL   LONG TERM GOALS: Target date: 09/19/2024  1.  Patient will complete five times sit to stand test in < 15 seconds indicating an increased LE strength and improved balance. Baseline: EVAL- 18.0 sec without UE Support Goal status: INITIAL  2.  Patient will improve ABC score by 9 points    to demonstrate statistically significant improvement in mobility and quality of life as it relates to their confidence in her balance.  Baseline: 66.25% Goal status: INITIAL   3.  Patient will increase Berg Balance score by > 6 points to demonstrate decreased fall risk during functional activities. Baseline: EVAL= 46/56 Goal status: INITIAL  4.   Patient will increase six minute walk test distance by >115ft for progression to community ambulator and improve gait ability Baseline: 1,122 Goal status: INITIAL  ASSESSMENT:  CLINICAL IMPRESSION: Patient is a 63 y.o. female who was seen today for physical therapy  treatment for cerebral infarction. PT treatment focused on SLS stance tasks as well as improve explosive movements including hop with UE support. Mild UE support required for step up to second steps but intermittently was able to perform without UE support. HEP expanded as listed. Pt will benefit from further skilled PT interventions to improve her mobility, strength, and balance  in order to increase ease and safety with mobility and ADLs and reduce fall risk.   OBJECTIVE IMPAIRMENTS: Abnormal gait, decreased activity tolerance, decreased balance, decreased coordination, decreased endurance, decreased mobility, difficulty walking, and decreased strength.   ACTIVITY LIMITATIONS: carrying, lifting, bending, sitting, standing, squatting, sleeping, stairs, and transfers  PARTICIPATION LIMITATIONS: meal prep, cleaning, laundry, driving, shopping, community activity, and yard work  PERSONAL FACTORS: 3+ comorbidities: knee  pain, spinal pain, depression are also affecting patient's functional outcome.   REHAB POTENTIAL: Good  CLINICAL DECISION MAKING: Evolving/moderate complexity  EVALUATION COMPLEXITY: Moderate  PLAN:  PT FREQUENCY: 1-2x/week  PT DURATION: 12 weeks  PLANNED INTERVENTIONS: 97164- PT Re-evaluation, 97750- Physical Performance Testing, 97110-Therapeutic exercises, 97530- Therapeutic activity, [id number]- Neuromuscular re-education, 97535- Self Care, [id number]- Manual therapy, [id number]- Gait training, [phone]- Orthotic Initial, [phone]- Orthotic/Prosthetic subsequent, [phone]- Canalith repositioning, [phone]- Electrical stimulation (manual), [id number]- Ultrasound, [id number] (1-2 muscles), [id number] (3+ muscles)- Dry Needling, Patient/Family education, Balance training, Stair training, Taping, Joint mobilization, Joint manipulation, Spinal manipulation, Spinal mobilization, Vestibular training, DME instructions, Cryotherapy, and Moist heat  PLAN FOR NEXT SESSION:   Plan for D/c Assessment on 10/22  Massie FORBES Dollar, PT 08/13/2024, 2:53 PM

## 2024-08-15 ENCOUNTER — Ambulatory Visit: Admitting: Occupational Therapy

## 2024-08-15 ENCOUNTER — Ambulatory Visit: Admitting: Physical Therapy

## 2024-08-15 DIAGNOSIS — M6281 Muscle weakness (generalized): Secondary | ICD-10-CM

## 2024-08-15 DIAGNOSIS — R278 Other lack of coordination: Secondary | ICD-10-CM | POA: Diagnosis not present

## 2024-08-15 DIAGNOSIS — R2681 Unsteadiness on feet: Secondary | ICD-10-CM | POA: Diagnosis not present

## 2024-08-15 NOTE — Therapy (Addendum)
 OUTPATIENT PHYSICAL THERAPY NEURO treatment. / DISCHARGE   Patient Name: Jamie Williams MRN: [id number] DOB:December 10, 1960, 63 y.o., female Today's Date: 08/15/2024   PCP: Tinnie Pro, PA-C REFERRING PROVIDER: Tinnie Pro, PA-C  END OF SESSION:  PT End of Session - 08/15/24 1440     Visit Number 7    Number of Visits 24    Date for Recertification  09/19/24    Progress Note Due on Visit 10    PT Start Time 1402    PT Stop Time 1430    PT Time Calculation (min) 28 min    Equipment Utilized During Treatment Gait belt    Activity Tolerance Patient tolerated treatment well    Behavior During Therapy WFL for tasks assessed/performed            Past Medical History:  Diagnosis Date   Anxiety    Family history of adverse reaction to anesthesia    sister had a difficult time waking up from mastectomy and passed away    GERD (gastroesophageal reflux disease)    History of chicken pox    History of kidney stones    Hyperlipidemia    Hypertension    PONV (postoperative nausea and vomiting)    nausea   Sciatic nerve pain    Past Surgical History:  Procedure Laterality Date   ANTERIOR CERVICAL DECOMP/DISCECTOMY FUSION N/A 12/24/2015   Procedure: Cervical five-six, Cervical six-seven  Anterior cervical decompression/diskectomy/fusion/interbody prosthesis/plate;  Surgeon: Reyes Budge, MD;  Location: [facility];  Service: Neurosurgery;  Laterality: N/A;   COLONOSCOPY WITH PROPOFOL  N/A 07/17/2018   Procedure: COLONOSCOPY WITH PROPOFOL ;  Surgeon: Therisa Bi, MD;  Location: [facility] ENDOSCOPY;  Service: Gastroenterology;  Laterality: N/A;   ECTOPIC PREGNANCY SURGERY  1991 and 1996   two Etopic preg.   Head Injuries  2006   surgery 2006 Cram/NS in [place]. Headaches with increased intracranial pressure. Repeat MRI in 2008 Negative   SUPRACERVICAL ABDOMINAL HYSTERECTOMY  2011   Abdominal; Ovaries intact; CERVIX INTACT. Fibroids/dys menorrhea. [place]   Ulnar  Neuropathy Right 2004   Ulnar Neuropathy surgical revision. 2004-2008. [place].   Patient Active Problem List   Diagnosis Date Noted   Acute pancreatitis 05/14/2024   GERD without esophagitis 05/14/2024   Anxiety and depression 05/14/2024   History of stroke 05/14/2024   Cerebrovascular accident (CVA) (HCC) 03/27/2024   Essential hypertension 03/27/2024   Hypothyroidism 03/27/2024   Dyslipidemia 03/27/2024   Peripheral neuropathy 03/27/2024   Ambulatory dysfunction 03/27/2024   Hypothyroid 07/22/2020   Chronic, continuous use of opioids 04/15/2020   Pap smear abnormality of cervix/human papillomavirus (HPV) positive 08/06/2019   Aortic atherosclerosis 10/23/2018   GERD (gastroesophageal reflux disease)    Hot flashes due to menopause 01/10/2017   Cervical spondylosis with radiculopathy 12/24/2015   Compression injury of cervical spinal nerve 10/07/2015   Left arm pain 08/29/2015   Neck pain 08/29/2015   Hematuria 07/03/2015   Allergic arthritis 06/27/2015   Arthritis 06/27/2015   Back ache 06/27/2015   Clinical depression 06/27/2015   H/O abnormal cervical Papanicolaou smear 06/27/2015   Elevated intracranial pressure 06/27/2015   Kidney stones 06/27/2015   Knee pain 06/27/2015   Abnormal mammogram 06/27/2015   Headache, migraine 06/27/2015   Neuropathy 06/27/2015   Hand paresthesia 06/27/2015   Pre-diabetes 06/27/2015   Vitamin D  deficiency 06/27/2015   Weight loss 06/27/2015   Adjustment reaction 06/27/2015   Hypercholesterolemia without hypertriglyceridemia 01/29/2010   Tobacco abuse 01/28/2010   Primary hypertension 09/16/2009  Insomnia 09/16/2009   Lesion of ulnar nerve 09/16/2009    ONSET DATE: 03/26/2024  REFERRING DIAG:  M70.101 (ICD-10-CM) - Other symptoms and signs involving the musculoskeletal system  I69.30 (ICD-10-CM) - Unspecified sequelae of cerebral infarction  Z86.73 (ICD-10-CM) - Personal history of transient ischemic attack (TIA), and  cerebral infarction without residual deficits    THERAPY DIAG:  Muscle weakness (generalized)  Unsteadiness on feet  Other lack of coordination  Rationale for Evaluation and Treatment: Rehabilitation  SUBJECTIVE:                                                                                                                                                                                             SUBJECTIVE STATEMENT:  08/15/2024   Pt doing well today. Is happy with progress and is please dwith ability to discharge form PT today.   FROM EVAL: I feel some better. I just had a hard time in July. Right after I started PT I went to ED with stomach issues then back 5 days later on 05/14/2024- with pancreatitis- spent several days in hospital. Had a f/u with primary and received new order back for PT. I feel like I just don't have any energy On disability- 12-13 years  Pt accompanied by: self   PERTINENT HISTORY: PMH includes: HTN, Anxiety, Dyslipidemia, GERD, Peripheral Neuropathy, Hypothyroidism. Patient had initiated PT services on 04/19/24 was seen twice before going back to hospital and admitted with pancreatitis. She was originally being seen due to have a CVA on 03/26/2024  PAIN:  Are you having pain? Yes: NPRS scale: current 6/10 Pain location: Right shoulder  Pain description: achy Aggravating factors: any use of Right UE Relieving factors: rest, tylenol - helps some  PRECAUTIONS: Fall  RED FLAGS: None   WEIGHT BEARING RESTRICTIONS: No  FALLS: Has patient fallen in last 6 months? Yes. Number of falls 1 fall 3 months ago- sitting on sofa- was getting up and fell forward.   LIVING ENVIRONMENT: Lives with: lives with their family- Husband and son Lives in: House/apartment Stairs: Yes: External: 3 steps; can reach both Has following equipment at home: Single point cane, Walker - 4 wheeled, and shower chair  PLOF: Independent  PATIENT GOALS: I just want to get better- use  my hand and improve my balance.   OBJECTIVE:  Note: Objective measures were completed at Evaluation unless otherwise noted.  DIAGNOSTIC FINDINGS: CLINICAL DATA:  63 year old female with headache and pain. Status post left lateral thalamic/internal capsule lacunar infarcts last month. Epigastric pain radiating under the left breast with nausea.   EXAM: CT HEAD WITHOUT CONTRAST   TECHNIQUE: Contiguous  axial images were obtained from the base of the skull through the vertex without intravenous contrast.   RADIATION DOSE REDUCTION: This exam was performed according to the departmental dose-optimization program which includes automated exposure control, adjustment of the mA and/or kV according to patient size and/or use of iterative reconstruction technique.   COMPARISON:  Brain MRI 03/26/2024. head CT 03/27/2024.   FINDINGS: Brain: Expected small foci of cystic encephalomalacia corresponding to the left deep gray and white matter lacunar infarcts on MRI last month. Elsewhere gray-white differentiation is stable.   Background cerebral volume remains normal for age. No midline shift, ventriculomegaly, mass effect, evidence of mass lesion, intracranial hemorrhage or evidence of cortically based acute infarction.   Vascular: No suspicious intracranial vascular hyperdensity. Calcified atherosclerosis at the skull base.   Skull: Previous suboccipital decompression. No acute osseous abnormality identified.   Sinuses/Orbits: Visualized paranasal sinuses and mastoids are stable and well aerated.   Other: No acute orbit or scalp soft tissue finding.   IMPRESSION: 1.  No acute intracranial abnormality. 2. Expected evolution of the Left deep gray and white matter lacunar infarcts on MRI last month. 3. Previous suboccipital decompression.     Electronically Signed   By: VEAR Hurst M.D.   On: 05/09/2024 03:58  COGNITION: Overall cognitive status: Within functional limits for tasks  assessed   SENSATION: R hand numbness and tingling   COORDINATION: Good heel to shin- bilateral  EDEMA:  None observed or reported   POSTURE: rounded shoulders, forward head, and increased thoracic kyphosis  LOWER EXTREMITY ROM:     Active  Right Eval Left Eval  Hip flexion    Hip extension    Hip abduction    Hip adduction    Hip internal rotation    Hip external rotation    Knee flexion    Knee extension    Ankle dorsiflexion    Ankle plantarflexion    Ankle inversion    Ankle eversion     (Blank rows = not tested)  LOWER EXTREMITY MMT:    MMT Right Eval Left Eval  Hip flexion 3+ 4+  Hip extension 4 4+  Hip abduction 4 4+  Hip adduction    Hip internal rotation    Hip external rotation    Knee flexion 4 5  Knee extension 4 5  Ankle dorsiflexion 4+ 5  Ankle plantarflexion    Ankle inversion    Ankle eversion    (Blank rows = not tested)  BED MOBILITY:  Not tested- Patient reports independent   TRANSFERS: Sit to stand: SBA  Assistive device utilized: None     Stand to sit: SBA  Assistive device utilized: None     Chair to chair: SBA  Assistive device utilized: None       RAMP:  Not tested  CURB:  Not tested  STAIRS: Not tested GAIT: Findings: Gait Characteristics: decreased arm swing- Right, decreased step length- Right, decreased step length- Left, and decreased stride length, Distance walked: approx 100 feet, and Level of assistance: SBA  FUNCTIONAL TESTS:  5 times sit to stand: 5 times sit to stand: 18.0 sec without UE Support Timed up and go (TUG): 9.78 sec  6 minute walk test: To be assessed next visit.  10 Meter walk test: 1.17  Berg Balance Scale:  Item Test date: 06/27/2024 Test date:  Test date:   Sitting to standing 4. able to stand without using hands and stabilize independently Insert OPRCBERGREEVAL SmartPhrase at re-test date Insert  OPRCBERGREEVAL SmartPhrase at re-test date  2. Standing unsupported 4. able to stand safely for  2 minutes    3. Sitting with back unsupported, feet supported 4. able to sit safely and securely for 2 minutes    4. Standing to sitting 3. controls descent by using hands    5. Pivot transfer  4. able to transfer safely with minor use of hands    6. Standing unsupported with eyes closed 4. able to stand 10 seconds safely    7. Standing unsupported with feet together 3. able to place feet together independently and stand 1 minute with supervision    8. Reaching forward with outstretched arms while standing 2. can reach forward 5 cm (2 inches)    9. Pick up object from the floor from standing 3. able to pick up slipper but needs supervision    10. Turning to look behind over left and right shoulders while standing 3. looks behind one side only, other side shows less weight shift    11. Turn 360 degrees 3. able to turn 360 degrees safely, one side only, in 4 seconds or less    12. Place alternate foot on step or stool while standing unsupported 4. ble to stand independently and safely and complete 8 steps in 20 seconds    13. Standing unsupported one foot in front 3. able to place foot ahead independently and hold 30 seconds    14. Standing on one leg 2. able to lift leg independently and hold >= 3 seconds      Total Score 46/56 Total Score /56 Total Score /56     PATIENT SURVEYS:  ABC scale: The Activities-Specific Balance Confidence (ABC) Scale-66.25%                                                                                                                             TREATMENT DATE:   6 Min Walk Test:  Instructed patient to ambulate as quickly and as safely as possible for 6 minutes using LRAD. Patient was allowed to take standing rest breaks without stopping the test, but if the patient required a sitting rest break the clock would be stopped and the test would be over.  Results: 1435 feet. Results indicate that the patient has reduced endurance with ambulation compared to age matched  norms.  Age Matched Norms (in meters): 66-69 yo M: 25 F: 60, 62-79 yo M: 72 F: 471, 30-89 yo M: 417 F: 392 MDC: 58.21 meters (190.98 feet) or 50 meters (ANPTA Core Set of Outcome Measures for Adults with Neurologic Conditions, 2018)  Five times Sit to Stand Test (FTSS)  TIME: 10.71 sec  Cut off scores indicative of increased fall risk: >12 sec CVA, >16 sec PD, >13 sec vestibular (ANPTA Core Set of Outcome Measures for Adults with Neurologic Conditions, 2018)  Patient demonstrates increased fall risk as noted by score of  56 /56 on Berg Balance Scale.  (<36= high  risk for falls, close to 100%; 37-45 significant >80%; 46-51 moderate >50%; 52-55 lower >25%)  [facility] PT Assessment - 08/15/24 0001       Berg Balance Test   Sit to Stand Able to stand without using hands and stabilize independently    Standing Unsupported Able to stand safely 2 minutes    Sitting with Back Unsupported but Feet Supported on Floor or Stool Able to sit safely and securely 2 minutes    Stand to Sit Sits safely with minimal use of hands    Transfers Able to transfer safely, minor use of hands    Standing Unsupported with Eyes Closed Able to stand 10 seconds safely    Standing Unsupported with Feet Together Able to place feet together independently and stand 1 minute safely    From Standing, Reach Forward with Outstretched Arm Can reach confidently >25 cm (10)    From Standing Position, Pick up Object from Floor Able to pick up shoe safely and easily    From Standing Position, Turn to Look Behind Over each Shoulder Looks behind from both sides and weight shifts well    Turn 360 Degrees Able to turn 360 degrees safely in 4 seconds or less    Standing Unsupported, Alternately Place Feet on Step/Stool Able to stand independently and safely and complete 8 steps in 20 seconds    Standing Unsupported, One Foot in Front Able to place foot tandem independently and hold 30 seconds    Standing on One Leg Able to lift leg  independently and hold > 10 seconds    Total Score 56         Self care: instructed in ways to minimize back pain and discomfort with daily tasks and instructed in benefits of low intensity walking for back related OA  PATIENT EDUCATION: Education details: Exam details, PT plan of care; Purpose of functional outcome measures, Education in HEP Person educated: Patient Education method: Explanation Education comprehension: verbalized understanding  HOME EXERCISE PROGRAM: Access Code: [email or web address] URL: [email or web address] Date: 07/30/2024 Prepared by: Massie Dollar  Exercises - Tandem Stance  - 1 x daily - 5 x weekly - 3 sets - 4 reps - 30 sec hold - Tandem Walking with Counter Support  - 1 x daily - 5 x weekly - 3 sets - 10 reps - Single Leg Stance with Support  - 1 x daily - 5 x weekly - 3 sets - 4 reps - 20 sec  hold - Sit to Stand with Arms Crossed  - 1 x daily - 5 x weekly - 3 sets - 10 reps - Backward Walking with Counter Support  - 1 x daily - 5 x weekly - 3 sets - 10 reps - Side Lunge with Counter Support  - 1 x daily - 7 x weekly - 3 sets - 10 reps - Lunge with Counter Support  - 1 x daily - 7 x weekly - 3 sets - 10 reps  GOALS: Goals reviewed with patient? Yes  SHORT TERM GOALS: Target date: 08/08/2024  Patient will be independent in home exercise program to improve strength/mobility for better functional independence with ADLs. Baseline: EVAL - No formal HEP in place- edcuated in Tandem and SLS today and added to HEP.  Goal status: MET   LONG TERM GOALS: Target date: 09/19/2024  1.  Patient will complete five times sit to stand test in < 15 seconds indicating an increased LE strength and improved balance. Baseline: EVAL- 18.0 sec without  UE Support 10/22: 10.71 sec  Goal status: MET  2.  Patient will improve ABC score by 9 points    to demonstrate statistically significant improvement in mobility and quality of life as it relates to their confidence in  her balance.  Baseline: 66.25%10/22:97% Goal status: INITIAL   3.  Patient will increase Berg Balance score by > 6 points to demonstrate decreased fall risk during functional activities. Baseline: EVAL= 46/56 10/22: 56 Goal status: MET  4.   Patient will increase six minute walk test distance by >124ft for progression to community ambulator and improve gait ability Baseline: 1,122 10/22:1435 ft Goal status: MET  ASSESSMENT:  CLINICAL IMPRESSION: Patient is a 63 y.o. female who was seen today for physical therapy  treatment for cerebral infarction. Pt has met all PT goals and is happy with D/C at this time. Pt still having some back pain but instructed in ways to minimize this with daily activities including light walking daily. Pt will be discharged with HEP in place and all needs met.   OBJECTIVE IMPAIRMENTS: Abnormal gait, decreased activity tolerance, decreased balance, decreased coordination, decreased endurance, decreased mobility, difficulty walking, and decreased strength.   ACTIVITY LIMITATIONS: carrying, lifting, bending, sitting, standing, squatting, sleeping, stairs, and transfers  PARTICIPATION LIMITATIONS: meal prep, cleaning, laundry, driving, shopping, community activity, and yard work  PERSONAL FACTORS: 3+ comorbidities: knee pain, spinal pain, depression are also affecting patient's functional outcome.   REHAB POTENTIAL: Good  CLINICAL DECISION MAKING: Evolving/moderate complexity  EVALUATION COMPLEXITY: Moderate  PLAN:  PT FREQUENCY: 1-2x/week  PT DURATION: 12 weeks  PLANNED INTERVENTIONS: 97164- PT Re-evaluation, 97750- Physical Performance Testing, 97110-Therapeutic exercises, 97530- Therapeutic activity, [id number]- Neuromuscular re-education, 97535- Self Care, [id number]- Manual therapy, [id number]- Gait training, [phone]- Orthotic Initial, [phone]- Orthotic/Prosthetic subsequent, [phone]- Canalith repositioning, [phone]- Electrical stimulation (manual), [id number]- Ultrasound, 79439  (1-2 muscles), [id number] (3+ muscles)- Dry Needling, Patient/Family education, Balance training, Stair training, Taping, Joint mobilization, Joint manipulation, Spinal manipulation, Spinal mobilization, Vestibular training, DME instructions, Cryotherapy, and Moist heat  PLAN FOR NEXT SESSION:   Plan for D/c Assessment on 10/22  Lonni KATHEE Gainer, PT 08/15/2024, 2:41 PM

## 2024-08-20 ENCOUNTER — Encounter: Admitting: Occupational Therapy

## 2024-08-20 ENCOUNTER — Ambulatory Visit: Admitting: Physical Therapy

## 2024-08-22 ENCOUNTER — Encounter: Admitting: Occupational Therapy

## 2024-08-22 ENCOUNTER — Ambulatory Visit: Admitting: Physical Therapy

## 2024-08-23 ENCOUNTER — Other Ambulatory Visit: Payer: Self-pay | Admitting: Physician Assistant

## 2024-08-23 DIAGNOSIS — I10 Essential (primary) hypertension: Secondary | ICD-10-CM

## 2024-08-24 ENCOUNTER — Telehealth: Payer: Self-pay | Admitting: Physician Assistant

## 2024-08-24 NOTE — Telephone Encounter (Signed)
 Left vm and sent mychart message to confirm 08/31/24 appointment-Toni

## 2024-08-26 ENCOUNTER — Other Ambulatory Visit: Payer: Self-pay | Admitting: Nurse Practitioner

## 2024-08-27 ENCOUNTER — Ambulatory Visit: Admitting: Physical Therapy

## 2024-08-27 ENCOUNTER — Encounter: Admitting: Occupational Therapy

## 2024-08-29 ENCOUNTER — Ambulatory Visit: Admitting: Physical Therapy

## 2024-08-29 ENCOUNTER — Encounter: Admitting: Occupational Therapy

## 2024-08-29 ENCOUNTER — Ambulatory Visit: Payer: BC Managed Care – PPO

## 2024-08-31 ENCOUNTER — Encounter: Payer: Self-pay | Admitting: Physician Assistant

## 2024-08-31 ENCOUNTER — Ambulatory Visit: Admitting: Physician Assistant

## 2024-08-31 VITALS — BP 150/88 | HR 72 | Temp 98.0°F | Resp 16 | Ht 65.0 in | Wt 168.4 lb

## 2024-08-31 DIAGNOSIS — Z1211 Encounter for screening for malignant neoplasm of colon: Secondary | ICD-10-CM | POA: Diagnosis not present

## 2024-08-31 DIAGNOSIS — F064 Anxiety disorder due to known physiological condition: Secondary | ICD-10-CM

## 2024-08-31 DIAGNOSIS — I10 Essential (primary) hypertension: Secondary | ICD-10-CM | POA: Diagnosis not present

## 2024-08-31 DIAGNOSIS — Z1212 Encounter for screening for malignant neoplasm of rectum: Secondary | ICD-10-CM

## 2024-08-31 DIAGNOSIS — M25611 Stiffness of right shoulder, not elsewhere classified: Secondary | ICD-10-CM

## 2024-08-31 DIAGNOSIS — Z0001 Encounter for general adult medical examination with abnormal findings: Secondary | ICD-10-CM

## 2024-08-31 DIAGNOSIS — M25511 Pain in right shoulder: Secondary | ICD-10-CM

## 2024-08-31 MED ORDER — CYCLOBENZAPRINE HCL 5 MG PO TABS
5.0000 mg | ORAL_TABLET | Freq: Every day | ORAL | 0 refills | Status: AC
Start: 2024-08-31 — End: ?

## 2024-08-31 NOTE — Progress Notes (Signed)
 [facility] [address] [place], [place] [address]  Internal MEDICINE  Office Visit Note  Patient Name: Jamie Williams  [id number]  [id number]  Date of Service: 08/31/2024  Chief Complaint  Patient presents with   Medicare Wellness   Gastroesophageal Reflux   Hypertension   Hyperlipidemia   Shoulder Pain    Right shoulder pain since before her stroke, but has worsened since.   Quality Metric Gaps    Colonoscopy and TDAP    HPI Lonette presents for an annual well visit Well-appearing 63 y.o. female Routine CRC screening: due, ordered Routine mammogram: UTD Pap smear: UTD, however hx of HPV on 2020 testing and normal on 2023 testing. Consider repeat  New or worsening pain: right shoulder pain, hard to move it and hurts to lay on it. Worse since stroke, and worse in the last month or so. Went to [facility] prior to stroke and had xray but no other workup. Taking extra strength tylenol . Significantly worse recently though and impacting sleep -Still coping with recent health changes. States her faith has been tested between stroke and pancreatitis and now shoulder, but that she passed the test and is hanging in there. Declines counseling right now and doing well with medication     08/31/2024    9:11 AM  MMSE - Mini Mental State Exam  Orientation to time 5  Orientation to Place 5  Registration 3  Attention/ Calculation 5  Recall 1  Language- name 2 objects 2  Language- repeat 1  Language- follow 3 step command 3  Language- read & follow direction 1  Write a sentence 1  Copy design 1  Total score 28    Functional Status Survey: Is the patient deaf or have difficulty hearing?: No Does the patient have difficulty seeing, even when wearing glasses/contacts?: No Does the patient have difficulty concentrating, remembering, or making decisions?: No Does the patient have difficulty walking or climbing stairs?: No Does the patient have difficulty dressing or  bathing?: No Does the patient have difficulty doing errands alone such as visiting a doctor's office or shopping?: No     01/20/2024   10:40 AM 02/06/2024    2:21 PM 04/30/2024    1:55 PM 07/16/2024   11:36 AM 08/31/2024    9:07 AM  Fall Risk  Falls in the past year? 0 0 0 1 0  Was there an injury with Fall? 0   0   Fall Risk Category Calculator 0   1        08/31/2024    9:07 AM  Depression screen PHQ 2/9  Decreased Interest 0  Down, Depressed, Hopeless 0  PHQ - 2 Score 0       01/20/2024   10:39 AM 08/16/2023    8:45 AM  GAD 7 : Generalized Anxiety Score  Nervous, Anxious, on Edge 0 1  Control/stop worrying 1 1  Worry too much - different things 0 1  Trouble relaxing 1 1  Restless 0 1  Easily annoyed or irritable 1 0  Afraid - awful might happen 0 0  Total GAD 7 Score 3 5  Anxiety Difficulty Somewhat difficult Somewhat difficult      Current Medication: Outpatient Encounter Medications as of 08/31/2024  Medication Sig   aspirin  EC 81 MG tablet Take 1 tablet (81 mg total) by mouth daily. Swallow whole.   atorvastatin  (LIPITOR) 80 MG tablet Take 1 tablet (80 mg total) by mouth at bedtime.   benzonatate  (TESSALON )  200 MG capsule TAKE 1 CAPSULE(200 MG) BY MOUTH THREE TIMES DAILY AS NEEDED FOR COUGH   colchicine  0.6 MG tablet Take 2 tablets (1.2 mg) on first day of gout flare, then 1 tablet daily   cyclobenzaprine  (FLEXERIL ) 5 MG tablet Take 1 tablet (5 mg total) by mouth at bedtime.   escitalopram  (LEXAPRO ) 20 MG tablet Take 1 tablet (20 mg total) by mouth daily.   levothyroxine  (SYNTHROID ) 50 MCG tablet Take 1 tablet (50 mcg total) by mouth daily.   losartan (COZAAR) 50 MG tablet Take 1 tablet (50 mg total) by mouth daily.   pantoprazole  (PROTONIX ) 40 MG tablet Take 1 tablet (40 mg total) by mouth daily.   promethazine  (PHENERGAN ) 25 MG tablet TAKE 1 TABLET(25 MG) BY MOUTH EVERY 6 HOURS AS NEEDED FOR NAUSEA OR VOMITING   sucralfate  (CARAFATE ) 1 GM/10ML suspension Take 10  mLs (1 g total) by mouth every 8 (eight) hours as needed.   [DISCONTINUED] cholecalciferol  (VITAMIN D ) 1000 units tablet Take 2,000 Units by mouth daily.    [DISCONTINUED] hydrALAZINE  (APRESOLINE ) 50 MG tablet TAKE 1 TABLET BY MOUTH THREE TIMES DAILY AS NEEDED FOR SYSTOLIC BLOOD PRESSURE GREATER THAN 150 MMHG   [DISCONTINUED] nicotine  (NICODERM CQ  - DOSED IN MG/24 HR) 7 mg/24hr patch PLACE 1 PATCH ONTO THE SKIN DAILY   [DISCONTINUED] ondansetron  (ZOFRAN -ODT) 4 MG disintegrating tablet Take 1 tablet (4 mg total) by mouth every 8 (eight) hours as needed for nausea or vomiting.   [DISCONTINUED] benzonatate  (TESSALON ) 200 MG capsule Take 1 capsule (200 mg total) by mouth 3 (three) times daily as needed for cough.   [DISCONTINUED] hydrALAZINE  (APRESOLINE ) 50 MG tablet Take 1 tablet (50 mg total) by mouth 3 (three) times daily as needed (If systolic BP greater than 150 mmHg.). If systolic BP greater than 150 mmHg.   [DISCONTINUED] lisinopril  (ZESTRIL ) 40 MG tablet Take 1 tablet (40 mg total) by mouth daily.   No facility-administered encounter medications on file as of 08/31/2024.    Surgical History: Past Surgical History:  Procedure Laterality Date   ANTERIOR CERVICAL DECOMP/DISCECTOMY FUSION N/A 12/24/2015   Procedure: Cervical five-six, Cervical six-seven  Anterior cervical decompression/diskectomy/fusion/interbody prosthesis/plate;  Surgeon: Reyes Budge, MD;  Location: [facility];  Service: Neurosurgery;  Laterality: N/A;   COLONOSCOPY WITH PROPOFOL  N/A 07/17/2018   Procedure: COLONOSCOPY WITH PROPOFOL ;  Surgeon: Therisa Bi, MD;  Location: [facility] ENDOSCOPY;  Service: Gastroenterology;  Laterality: N/A;   ECTOPIC PREGNANCY SURGERY  1991 and 1996   two Etopic preg.   Head Injuries  2006   surgery 2006 Cram/NS in [place]. Headaches with increased intracranial pressure. Repeat MRI in 2008 Negative   SUPRACERVICAL ABDOMINAL HYSTERECTOMY  2011   Abdominal; Ovaries intact; CERVIX INTACT.  Fibroids/dys menorrhea. Weaver-Lee   Ulnar Neuropathy Right 2004   Ulnar Neuropathy surgical revision. 2004-2008. [place].    Medical History: Past Medical History:  Diagnosis Date   Anxiety    Family history of adverse reaction to anesthesia    sister had a difficult time waking up from mastectomy and passed away    GERD (gastroesophageal reflux disease)    History of chicken pox    History of kidney stones    Hyperlipidemia    Hypertension    PONV (postoperative nausea and vomiting)    nausea   Sciatic nerve pain     Family History: Family History  Problem Relation Age of Onset   Hypertension Mother    Diabetes Mother  Type 2   Breast cancer Mother 38   Cancer Sister 35       Breast   Diabetes Sister        type 2   Breast cancer Sister 55   Breast cancer Sister    Diabetes Sister    Diabetes Sister    Diabetes Sister        type 2   Liver cancer Brother    Stomach cancer Brother     Social History   Socioeconomic History   Marital status: Married    Spouse name: Not on file   Number of children: 1   Years of education: Not on file   Highest education level: 12th grade  Occupational History   Occupation: disabled  Tobacco Use   Smoking status: Every Day    Current packs/day: 0.50    Types: Cigarettes   Smokeless tobacco: Never   Tobacco comments:    5 cigarettes daily.  Vaping Use   Vaping status: Never Used  Substance and Sexual Activity   Alcohol use: Not Currently    Alcohol/week: 0.0 standard drinks of alcohol   Drug use: Not Currently   Sexual activity: Yes    Birth control/protection: None  Other Topics Concern   Not on file  Social History Narrative   Right handed   Caffeine-1 cup daily   Social Drivers of Health   Financial Resource Strain: Low Risk  (08/24/2023)   Overall Financial Resource Strain (CARDIA)    Difficulty of Paying Living Expenses: Not hard at all  Food Insecurity: No Food Insecurity (05/14/2024)    Hunger Vital Sign    Worried About Running Out of Food in the Last Year: Never true    Ran Out of Food in the Last Year: Never true  Transportation Needs: No Transportation Needs (05/14/2024)   PRAPARE - Administrator, Civil Service (Medical): No    Lack of Transportation (Non-Medical): No  Physical Activity: Insufficiently Active (08/24/2023)   Exercise Vital Sign    Days of Exercise per Week: 1 day    Minutes of Exercise per Session: 10 min  Stress: Stress Concern Present (08/24/2023)   [employer] of Occupational Health - Occupational Stress Questionnaire    Feeling of Stress : To some extent  Social Connections: Moderately Isolated (08/24/2023)   Social Connection and Isolation Panel    Frequency of Communication with Friends and Family: Once a week    Frequency of Social Gatherings with Friends and Family: Once a week    Attends Religious Services: 1 to 4 times per year    Active Member of [employer] or Organizations: No    Attends Banker Meetings: Never    Marital Status: Married  Catering Manager Violence: Not At Risk (05/14/2024)   Humiliation, Afraid, Rape, and Kick questionnaire    Fear of Current or Ex-Partner: No    Emotionally Abused: No    Physically Abused: No    Sexually Abused: No      Review of Systems  Constitutional:  Negative for chills, fatigue and unexpected weight change.  HENT:  Negative for congestion, rhinorrhea, sneezing and sore throat.   Eyes:  Negative for redness.  Respiratory:  Negative for cough, chest tightness and shortness of breath.   Cardiovascular:  Negative for chest pain and palpitations.  Gastrointestinal:  Negative for abdominal pain and vomiting.  Genitourinary:  Negative for dysuria and frequency.  Musculoskeletal:  Positive for arthralgias and back pain. Negative  for gait problem, joint swelling and neck pain.  Skin:  Negative for rash.  Neurological:  Positive for weakness. Negative for tremors.   Hematological:  Negative for adenopathy. Does not bruise/bleed easily.  Psychiatric/Behavioral:  Negative for suicidal ideas. Behavioral problem: Depression.The patient is nervous/anxious.     Vital Signs: BP (!) 150/88   Pulse 72   Temp 98 F (36.7 C)   Resp 16   Ht 5' 5 (1.651 m)   Wt 168 lb 6.4 oz (76.4 kg)   SpO2 95%   BMI 28.02 kg/m    Physical Exam Vitals and nursing note reviewed.  Constitutional:      Appearance: Normal appearance.  HENT:     Head: Normocephalic and atraumatic.  Eyes:     Extraocular Movements: Extraocular movements intact.  Cardiovascular:     Rate and Rhythm: Normal rate and regular rhythm.  Pulmonary:     Effort: Pulmonary effort is normal.     Breath sounds: Normal breath sounds. No wheezing.  Musculoskeletal:        General: Tenderness present.     Comments: Right shoulder tenderness and significantly reduced ROM both active and passive  Skin:    General: Skin is warm and dry.  Neurological:     General: No focal deficit present.     Mental Status: She is alert.  Psychiatric:        Mood and Affect: Mood normal.        Behavior: Behavior normal.        Assessment/Plan: 1. Encounter for Medicare annual examination with abnormal findings (Primary) AWV performed, due for colonoscopy  2. Acute pain of right shoulder May use flexeril  as needed, cautioned on S/E. Will avoid NSAIDs for now given ASA use and continue tylenol  as needed. Will refer to ortho for further evaluation - AMB referral to orthopedics - cyclobenzaprine  (FLEXERIL ) 5 MG tablet; Take 1 tablet (5 mg total) by mouth at bedtime.  Dispense: 20 tablet; Refill: 0  3. Decreased ROM of right shoulder - AMB referral to orthopedics - cyclobenzaprine  (FLEXERIL ) 5 MG tablet; Take 1 tablet (5 mg total) by mouth at bedtime.  Dispense: 20 tablet; Refill: 0  4. Screening for colorectal cancer - Ambulatory referral to Gastroenterology  5. Primary hypertension Elevated due to  pain in office, but had been controlled. Will monitor and recheck in 1 month  6. Anxiety disorder due to medical condition Continue Lexapro , patient declines counseling at this time but may call if she changes her mind    General Counseling: mishti swanton understanding of the findings of todays visit and agrees with plan of treatment. I have discussed any further diagnostic evaluation that may be needed or ordered today. We also reviewed her medications today. she has been encouraged to call the office with any questions or concerns that should arise related to todays visit.    Orders Placed This Encounter  Procedures   Ambulatory referral to Gastroenterology   AMB referral to orthopedics    Meds ordered this encounter  Medications   cyclobenzaprine  (FLEXERIL ) 5 MG tablet    Sig: Take 1 tablet (5 mg total) by mouth at bedtime.    Dispense:  20 tablet    Refill:  0    Return in about 4 weeks (around 09/28/2024) for HTN, ortho to [facility].   Total time spent:35 Minutes Time spent includes review of chart, medications, test results, and follow up plan with the patient.   [place] Controlled Substance Database was  reviewed by me.  This patient was seen by Tinnie Pro, PA-C in collaboration with Dr. Sigrid Bathe as a part of collaborative care agreement.  Tinnie Pro, PA-C Internal medicine

## 2024-09-03 ENCOUNTER — Encounter: Admitting: Occupational Therapy

## 2024-09-03 ENCOUNTER — Ambulatory Visit: Admitting: Physical Therapy

## 2024-09-04 ENCOUNTER — Telehealth: Payer: Self-pay | Admitting: Physician Assistant

## 2024-09-04 NOTE — Telephone Encounter (Signed)
 Orthopedic referral sent via [facility]. Lvm notifying patient. Gave patient telephone # [phone]  Gastroenterology referral sent via [facility]. Lvm notifying patient.SABRA Pac patient telephone # [phone]

## 2024-09-05 ENCOUNTER — Encounter: Admitting: Occupational Therapy

## 2024-09-05 ENCOUNTER — Ambulatory Visit: Admitting: Physical Therapy

## 2024-09-10 ENCOUNTER — Ambulatory Visit: Admitting: Physical Therapy

## 2024-09-10 ENCOUNTER — Encounter: Admitting: Occupational Therapy

## 2024-09-12 ENCOUNTER — Encounter: Admitting: Occupational Therapy

## 2024-09-12 ENCOUNTER — Ambulatory Visit: Admitting: Physical Therapy

## 2024-09-17 ENCOUNTER — Encounter: Admitting: Occupational Therapy

## 2024-09-17 ENCOUNTER — Ambulatory Visit: Admitting: Physical Therapy

## 2024-09-19 ENCOUNTER — Ambulatory Visit: Admitting: Physical Therapy

## 2024-09-19 ENCOUNTER — Encounter: Admitting: Occupational Therapy

## 2024-09-24 ENCOUNTER — Encounter: Admitting: Occupational Therapy

## 2024-09-24 ENCOUNTER — Ambulatory Visit: Admitting: Physical Therapy

## 2024-09-26 ENCOUNTER — Encounter: Admitting: Occupational Therapy

## 2024-09-26 ENCOUNTER — Ambulatory Visit: Admitting: Physical Therapy

## 2024-09-26 ENCOUNTER — Telehealth: Payer: Self-pay | Admitting: Physician Assistant

## 2024-09-26 NOTE — Telephone Encounter (Signed)
 GI appointment 02/18/2025 with [facility]

## 2024-09-28 ENCOUNTER — Ambulatory Visit: Admitting: Physician Assistant

## 2024-10-01 ENCOUNTER — Ambulatory Visit: Admitting: Physical Therapy

## 2024-10-01 ENCOUNTER — Ambulatory Visit: Admitting: Physician Assistant

## 2024-10-01 ENCOUNTER — Encounter: Admitting: Occupational Therapy

## 2024-10-02 ENCOUNTER — Other Ambulatory Visit: Payer: Self-pay | Admitting: Nurse Practitioner

## 2024-10-02 NOTE — Progress Notes (Signed)
 Jamie Williams                                          MRN: [id number]   10/02/2024   The [facility] Quality Team Specialist reviewed this patient medical record for the purposes of chart review for care gap closure. The following were reviewed: chart review for care gap closure-controlling blood pressure.    [facility]BCI Quality Team

## 2024-10-03 ENCOUNTER — Ambulatory Visit: Admitting: Physical Therapy

## 2024-10-03 ENCOUNTER — Encounter: Admitting: Occupational Therapy

## 2024-10-05 ENCOUNTER — Telehealth: Payer: Self-pay | Admitting: Physician Assistant

## 2024-10-05 NOTE — Telephone Encounter (Signed)
 Per Powell w/ [facility], referral has been closed due to patient not returning calls -Andree

## 2024-10-08 ENCOUNTER — Encounter: Admitting: Occupational Therapy

## 2024-10-08 ENCOUNTER — Ambulatory Visit: Admitting: Physical Therapy

## 2024-10-10 ENCOUNTER — Encounter: Admitting: Occupational Therapy

## 2024-10-10 ENCOUNTER — Ambulatory Visit: Admitting: Physical Therapy

## 2024-10-12 ENCOUNTER — Ambulatory Visit: Admitting: Physician Assistant

## 2024-10-12 ENCOUNTER — Encounter: Payer: Self-pay | Admitting: Physician Assistant

## 2024-10-12 VITALS — BP 148/90 | HR 71 | Temp 98.0°F | Resp 16 | Ht 65.0 in | Wt 168.0 lb

## 2024-10-12 DIAGNOSIS — E039 Hypothyroidism, unspecified: Secondary | ICD-10-CM | POA: Diagnosis not present

## 2024-10-12 DIAGNOSIS — M2550 Pain in unspecified joint: Secondary | ICD-10-CM | POA: Diagnosis not present

## 2024-10-12 DIAGNOSIS — I10 Essential (primary) hypertension: Secondary | ICD-10-CM | POA: Diagnosis not present

## 2024-10-12 DIAGNOSIS — R110 Nausea: Secondary | ICD-10-CM | POA: Diagnosis not present

## 2024-10-12 MED ORDER — LOSARTAN POTASSIUM 50 MG PO TABS: 75.0000 mg | ORAL_TABLET | Freq: Every day | ORAL | Status: AC

## 2024-10-12 NOTE — Progress Notes (Signed)
 [facility] [address] [place], [place] [address]  Internal MEDICINE  Office Visit Note  Patient Name: Jamie Williams  [id number]  [id number]  Date of Service: 10/12/2024  Chief Complaint  Patient presents with   Follow-up   Hypertension   Quality Metric Gaps    Colonoscopy    HPI Pt is here for routine follow up -has not taken BP med today, but reports it has been a little elevated at home too -Has not heard from ortho--per chart it states not returning calls, but states she didn't receive any. Will give her # to call them -still has ongoing nausea and states she has been taking promethazine  for years, not just since acute pancreatitis. Will discuss with GI at appt to discuss colonoscopy as well -Feels more stiff in AM, does better once getting going but takes 30-42mins  Current Medication: Outpatient Encounter Medications as of 10/12/2024  Medication Sig   aspirin  EC 81 MG tablet Take 1 tablet (81 mg total) by mouth daily. Swallow whole.   atorvastatin  (LIPITOR) 80 MG tablet Take 1 tablet (80 mg total) by mouth at bedtime.   benzonatate  (TESSALON ) 200 MG capsule TAKE 1 CAPSULE(200 MG) BY MOUTH THREE TIMES DAILY AS NEEDED FOR COUGH   colchicine  0.6 MG tablet Take 2 tablets (1.2 mg) on first day of gout flare, then 1 tablet daily   escitalopram  (LEXAPRO ) 20 MG tablet Take 1 tablet (20 mg total) by mouth daily.   levothyroxine  (SYNTHROID ) 50 MCG tablet Take 1 tablet (50 mcg total) by mouth daily.   pantoprazole  (PROTONIX ) 40 MG tablet Take 1 tablet (40 mg total) by mouth daily.   promethazine  (PHENERGAN ) 25 MG tablet TAKE 1 TABLET(25 MG) BY MOUTH EVERY 6 HOURS AS NEEDED FOR NAUSEA OR VOMITING   sucralfate  (CARAFATE ) 1 GM/10ML suspension Take 10 mLs (1 g total) by mouth every 8 (eight) hours as needed.   [DISCONTINUED] cyclobenzaprine  (FLEXERIL ) 5 MG tablet Take 1 tablet (5 mg total) by mouth at bedtime.   [DISCONTINUED] losartan  (COZAAR ) 50 MG tablet Take 1 tablet  (50 mg total) by mouth daily.   losartan  (COZAAR ) 50 MG tablet Take 1.5 tablets (75 mg total) by mouth daily.   No facility-administered encounter medications on file as of 10/12/2024.    Surgical History: Past Surgical History:  Procedure Laterality Date   ANTERIOR CERVICAL DECOMP/DISCECTOMY FUSION N/A 12/24/2015   Procedure: Cervical five-six, Cervical six-seven  Anterior cervical decompression/diskectomy/fusion/interbody prosthesis/plate;  Surgeon: Reyes Budge, MD;  Location: [facility] NEURO ORS;  Service: Neurosurgery;  Laterality: N/A;   COLONOSCOPY WITH PROPOFOL  N/A 07/17/2018   Procedure: COLONOSCOPY WITH PROPOFOL ;  Surgeon: Therisa Bi, MD;  Location: [facility] ENDOSCOPY;  Service: Gastroenterology;  Laterality: N/A;   ECTOPIC PREGNANCY SURGERY  1991 and 1996   two Etopic preg.   Head Injuries  2006   surgery 2006 Cram/NS in [place]. Headaches with increased intracranial pressure. Repeat MRI in 2008 Negative   SUPRACERVICAL ABDOMINAL HYSTERECTOMY  2011   Abdominal; Ovaries intact; CERVIX INTACT. Fibroids/dys menorrhea. Weaver-Lee   Ulnar Neuropathy Right 2004   Ulnar Neuropathy surgical revision. 2004-2008. [place].    Medical History: Past Medical History:  Diagnosis Date   Anxiety    Family history of adverse reaction to anesthesia    sister had a difficult time waking up from mastectomy and passed away    GERD (gastroesophageal reflux disease)    History of chicken pox    History of kidney stones    Hyperlipidemia  Hypertension    PONV (postoperative nausea and vomiting)    nausea   Sciatic nerve pain     Family History: Family History  Problem Relation Age of Onset   Hypertension Mother    Diabetes Mother        Type 2   Breast cancer Mother 16   Cancer Sister 62       Breast   Diabetes Sister        type 2   Breast cancer Sister 28   Breast cancer Sister    Diabetes Sister    Diabetes Sister    Diabetes Sister        type 2   Liver cancer Brother     Stomach cancer Brother     Social History   Socioeconomic History   Marital status: Married    Spouse name: Not on file   Number of children: 1   Years of education: Not on file   Highest education level: 12th grade  Occupational History   Occupation: disabled  Tobacco Use   Smoking status: Every Day    Current packs/day: 0.50    Types: Cigarettes   Smokeless tobacco: Never   Tobacco comments:    5 cigarettes daily.  Vaping Use   Vaping status: Never Used  Substance and Sexual Activity   Alcohol use: Not Currently    Alcohol/week: 0.0 standard drinks of alcohol   Drug use: Not Currently   Sexual activity: Yes    Birth control/protection: None  Other Topics Concern   Not on file  Social History Narrative   Right handed   Caffeine-1 cup daily   Social Drivers of Health   Tobacco Use: High Risk (10/12/2024)   Patient History    Smoking Tobacco Use: Every Day    Smokeless Tobacco Use: Never    Passive Exposure: Not on file  Financial Resource Strain: Low Risk (08/24/2023)   Overall Financial Resource Strain (CARDIA)    Difficulty of Paying Living Expenses: Not hard at all  Food Insecurity: No Food Insecurity (05/14/2024)   Epic    Worried About Radiation Protection Practitioner of Food in the Last Year: Never true    Ran Out of Food in the Last Year: Never true  Transportation Needs: No Transportation Needs (05/14/2024)   Epic    Lack of Transportation (Medical): No    Lack of Transportation (Non-Medical): No  Physical Activity: Insufficiently Active (08/24/2023)   Exercise Vital Sign    Days of Exercise per Week: 1 day    Minutes of Exercise per Session: 10 min  Stress: Stress Concern Present (08/24/2023)   [employer] of Occupational Health - Occupational Stress Questionnaire    Feeling of Stress : To some extent  Social Connections: Moderately Isolated (08/24/2023)   Social Connection and Isolation Panel    Frequency of Communication with Friends and Family: Once a week     Frequency of Social Gatherings with Friends and Family: Once a week    Attends Religious Services: 1 to 4 times per year    Active Member of [employer] or Organizations: No    Attends Banker Meetings: Never    Marital Status: Married  Catering Manager Violence: Not At Risk (05/14/2024)   Epic    Fear of Current or Ex-Partner: No    Emotionally Abused: No    Physically Abused: No    Sexually Abused: No  Depression (PHQ2-9): Low Risk (08/31/2024)   Depression (PHQ2-9)    PHQ-2  Score: 0  Alcohol Screen: Low Risk (08/24/2023)   Alcohol Screen    Last Alcohol Screening Score (AUDIT): 1  Housing: Low Risk (05/14/2024)   Epic    Unable to Pay for Housing in the Last Year: No    Number of Times Moved in the Last Year: 0    Homeless in the Last Year: No  Utilities: Not At Risk (05/14/2024)   Epic    Threatened with loss of utilities: No  Health Literacy: Adequate Health Literacy (08/24/2023)   B1300 Health Literacy    Frequency of need for help with medical instructions: Never      Review of Systems  Constitutional:  Negative for chills, fatigue and unexpected weight change.  HENT:  Negative for congestion, rhinorrhea, sneezing and sore throat.   Eyes:  Negative for redness.  Respiratory:  Negative for cough, chest tightness and shortness of breath.   Cardiovascular:  Negative for chest pain and palpitations.  Gastrointestinal:  Negative for abdominal pain and vomiting.  Genitourinary:  Negative for dysuria and frequency.  Musculoskeletal:  Positive for arthralgias and back pain. Negative for gait problem, joint swelling and neck pain.  Skin:  Negative for rash.  Neurological:  Negative for tremors.  Hematological:  Negative for adenopathy. Does not bruise/bleed easily.  Psychiatric/Behavioral:  Negative for suicidal ideas. Behavioral problem: Depression.The patient is nervous/anxious.     Vital Signs: BP (!) 148/90   Pulse 71   Temp 98 F (36.7 C)   Resp 16   Ht  5' 5 (1.651 m)   Wt 168 lb (76.2 kg)   SpO2 97%   BMI 27.96 kg/m    Physical Exam Vitals and nursing note reviewed.  Constitutional:      Appearance: Normal appearance.  HENT:     Head: Normocephalic and atraumatic.  Eyes:     Extraocular Movements: Extraocular movements intact.  Cardiovascular:     Rate and Rhythm: Normal rate and regular rhythm.  Pulmonary:     Effort: Pulmonary effort is normal.     Breath sounds: Normal breath sounds. No wheezing.  Skin:    General: Skin is warm and dry.  Neurological:     General: No focal deficit present.     Mental Status: She is alert.  Psychiatric:        Mood and Affect: Mood normal.        Behavior: Behavior normal.        Assessment/Plan: 1. Essential hypertension (Primary) Will increase to 75mg  losartan  by taking 1.5 tabs of her 50mg  and monitor. Will check labs after increasing for monitoring - Comprehensive metabolic panel with GFR  2. Polyarthralgia Likely OA, but will check labs for other causes - Rheumatoid Factor - CYCLIC CITRUL PEPTIDE ANTIBODY, IGG/IGA - ANA w/Reflex if Positive - Sed Rate (ESR)  3. Nausea Will discuss further with GI, will recheck lipase - Lipase  4. Hypothyroidism, unspecified type Still needs labs rechecked-reordered - TSH + free T4   General Counseling: Ikhlas verbalizes understanding of the findings of todays visit and agrees with plan of treatment. I have discussed any further diagnostic evaluation that may be needed or ordered today. We also reviewed her medications today. she has been encouraged to call the office with any questions or concerns that should arise related to todays visit.    Orders Placed This Encounter  Procedures   Rheumatoid Factor   CYCLIC CITRUL PEPTIDE ANTIBODY, IGG/IGA   TSH + free T4   ANA w/Reflex if  Positive   Sed Rate (ESR)   Lipase   Comprehensive metabolic panel with GFR    Meds ordered this encounter  Medications   losartan  (COZAAR ) 50  MG tablet    Sig: Take 1.5 tablets (75 mg total) by mouth daily.    This patient was seen by Tinnie Pro, PA-C in collaboration with Dr. Sigrid Bathe as a part of collaborative care agreement.   Total time spent:30 Minutes Time spent includes review of chart, medications, test results, and follow up plan with the patient.      Dr Fozia M Khan Internal medicine

## 2024-10-15 ENCOUNTER — Encounter: Admitting: Occupational Therapy

## 2024-10-15 ENCOUNTER — Ambulatory Visit: Admitting: Physical Therapy

## 2024-10-17 ENCOUNTER — Ambulatory Visit: Admitting: Physical Therapy

## 2024-10-17 ENCOUNTER — Encounter: Admitting: Occupational Therapy

## 2024-10-22 ENCOUNTER — Ambulatory Visit: Admitting: Physical Therapy

## 2024-10-22 ENCOUNTER — Encounter: Admitting: Occupational Therapy

## 2024-10-24 ENCOUNTER — Encounter: Admitting: Occupational Therapy

## 2024-10-24 ENCOUNTER — Ambulatory Visit: Admitting: Physical Therapy

## 2024-10-29 ENCOUNTER — Ambulatory Visit: Admitting: Physical Therapy

## 2024-10-29 ENCOUNTER — Telehealth: Payer: Self-pay

## 2024-10-29 ENCOUNTER — Encounter: Admitting: Occupational Therapy

## 2024-10-30 NOTE — Telephone Encounter (Signed)
 Left message for patient to give office a call back.

## 2024-10-31 ENCOUNTER — Ambulatory Visit: Admitting: Physical Therapy

## 2024-10-31 ENCOUNTER — Other Ambulatory Visit: Payer: Self-pay

## 2024-10-31 ENCOUNTER — Encounter: Admitting: Occupational Therapy

## 2024-10-31 ENCOUNTER — Telehealth: Payer: Self-pay

## 2024-10-31 MED ORDER — AZITHROMYCIN 250 MG PO TABS: ORAL_TABLET | ORAL | 0 refills | Status: AC

## 2024-10-31 NOTE — Telephone Encounter (Signed)
 Patient called since Friday she has been coughing, runny nose and headache, wants to know what should she take over the counter or can you give her something? As per lauren sent her zpak and  left several message to call us  back sent pt my chart message

## 2024-11-05 ENCOUNTER — Ambulatory Visit: Admitting: Physical Therapy

## 2024-11-05 ENCOUNTER — Encounter: Admitting: Occupational Therapy

## 2024-11-07 ENCOUNTER — Encounter: Admitting: Occupational Therapy

## 2024-11-07 ENCOUNTER — Ambulatory Visit: Admitting: Physical Therapy

## 2024-11-16 ENCOUNTER — Ambulatory Visit: Admitting: Physician Assistant

## 2025-09-02 ENCOUNTER — Ambulatory Visit: Admitting: Physician Assistant
